# Patient Record
Sex: Female | Born: 1945 | Race: Black or African American | Hispanic: No | Smoking: Current some day smoker
Health system: Southern US, Community
[De-identification: ages and names within clinical notes are randomized; demographics above are authoritative.]

## PROBLEM LIST (undated history)

## (undated) DIAGNOSIS — I10 Essential (primary) hypertension: Secondary | ICD-10-CM

## (undated) DIAGNOSIS — I639 Cerebral infarction, unspecified: Secondary | ICD-10-CM

## (undated) HISTORY — PX: ABDOMINAL HYSTERECTOMY: SHX81

---

## 2020-09-10 ENCOUNTER — Observation Stay (HOSPITAL_COMMUNITY): Payer: Medicare Other

## 2020-09-10 ENCOUNTER — Encounter (HOSPITAL_BASED_OUTPATIENT_CLINIC_OR_DEPARTMENT_OTHER): Payer: Self-pay

## 2020-09-10 ENCOUNTER — Inpatient Hospital Stay (HOSPITAL_BASED_OUTPATIENT_CLINIC_OR_DEPARTMENT_OTHER)
Admission: EM | Admit: 2020-09-10 | Discharge: 2020-09-13 | DRG: 391 | Disposition: A | Discharge status: Home / Self Care | Payer: Medicare Other | Attending: Internal Medicine | Admitting: Internal Medicine

## 2020-09-10 ENCOUNTER — Emergency Department (HOSPITAL_BASED_OUTPATIENT_CLINIC_OR_DEPARTMENT_OTHER): Payer: Medicare Other

## 2020-09-10 ENCOUNTER — Other Ambulatory Visit: Payer: Self-pay

## 2020-09-10 DIAGNOSIS — K2980 Duodenitis without bleeding: Secondary | ICD-10-CM | POA: Diagnosis present

## 2020-09-10 DIAGNOSIS — Z9049 Acquired absence of other specified parts of digestive tract: Secondary | ICD-10-CM

## 2020-09-10 DIAGNOSIS — E860 Dehydration: Secondary | ICD-10-CM | POA: Diagnosis not present

## 2020-09-10 DIAGNOSIS — R197 Diarrhea, unspecified: Secondary | ICD-10-CM | POA: Diagnosis present

## 2020-09-10 DIAGNOSIS — D649 Anemia, unspecified: Secondary | ICD-10-CM | POA: Diagnosis present

## 2020-09-10 DIAGNOSIS — F17210 Nicotine dependence, cigarettes, uncomplicated: Secondary | ICD-10-CM | POA: Diagnosis not present

## 2020-09-10 DIAGNOSIS — R64 Cachexia: Secondary | ICD-10-CM | POA: Diagnosis not present

## 2020-09-10 DIAGNOSIS — M954 Acquired deformity of chest and rib: Secondary | ICD-10-CM | POA: Diagnosis not present

## 2020-09-10 DIAGNOSIS — I10 Essential (primary) hypertension: Secondary | ICD-10-CM | POA: Diagnosis present

## 2020-09-10 DIAGNOSIS — E876 Hypokalemia: Secondary | ICD-10-CM | POA: Diagnosis present

## 2020-09-10 DIAGNOSIS — K2950 Unspecified chronic gastritis without bleeding: Secondary | ICD-10-CM | POA: Diagnosis not present

## 2020-09-10 DIAGNOSIS — K52832 Lymphocytic colitis: Secondary | ICD-10-CM | POA: Diagnosis present

## 2020-09-10 DIAGNOSIS — Z9181 History of falling: Secondary | ICD-10-CM

## 2020-09-10 DIAGNOSIS — R0602 Shortness of breath: Secondary | ICD-10-CM

## 2020-09-10 DIAGNOSIS — R1310 Dysphagia, unspecified: Secondary | ICD-10-CM | POA: Diagnosis not present

## 2020-09-10 DIAGNOSIS — Z90710 Acquired absence of both cervix and uterus: Secondary | ICD-10-CM

## 2020-09-10 DIAGNOSIS — W19XXXD Unspecified fall, subsequent encounter: Secondary | ICD-10-CM | POA: Diagnosis not present

## 2020-09-10 DIAGNOSIS — K635 Polyp of colon: Secondary | ICD-10-CM | POA: Diagnosis present

## 2020-09-10 DIAGNOSIS — Z681 Body mass index (BMI) 19 or less, adult: Secondary | ICD-10-CM | POA: Diagnosis not present

## 2020-09-10 DIAGNOSIS — N179 Acute kidney failure, unspecified: Secondary | ICD-10-CM | POA: Diagnosis present

## 2020-09-10 DIAGNOSIS — E43 Unspecified severe protein-calorie malnutrition: Secondary | ICD-10-CM | POA: Diagnosis not present

## 2020-09-10 DIAGNOSIS — K648 Other hemorrhoids: Secondary | ICD-10-CM | POA: Diagnosis not present

## 2020-09-10 DIAGNOSIS — R7401 Elevation of levels of liver transaminase levels: Secondary | ICD-10-CM | POA: Diagnosis present

## 2020-09-10 DIAGNOSIS — R0781 Pleurodynia: Secondary | ICD-10-CM | POA: Diagnosis not present

## 2020-09-10 DIAGNOSIS — Z20822 Contact with and (suspected) exposure to covid-19: Secondary | ICD-10-CM | POA: Diagnosis present

## 2020-09-10 DIAGNOSIS — R748 Abnormal levels of other serum enzymes: Secondary | ICD-10-CM | POA: Diagnosis present

## 2020-09-10 DIAGNOSIS — K529 Noninfective gastroenteritis and colitis, unspecified: Secondary | ICD-10-CM | POA: Diagnosis not present

## 2020-09-10 DIAGNOSIS — S2241XD Multiple fractures of ribs, right side, subsequent encounter for fracture with routine healing: Secondary | ICD-10-CM

## 2020-09-10 DIAGNOSIS — Z8249 Family history of ischemic heart disease and other diseases of the circulatory system: Secondary | ICD-10-CM

## 2020-09-10 DIAGNOSIS — R54 Age-related physical debility: Secondary | ICD-10-CM | POA: Diagnosis present

## 2020-09-10 DIAGNOSIS — Z8711 Personal history of peptic ulcer disease: Secondary | ICD-10-CM | POA: Diagnosis not present

## 2020-09-10 DIAGNOSIS — R0789 Other chest pain: Secondary | ICD-10-CM | POA: Diagnosis present

## 2020-09-10 DIAGNOSIS — R52 Pain, unspecified: Secondary | ICD-10-CM

## 2020-09-10 HISTORY — DX: Essential (primary) hypertension: I10

## 2020-09-10 LAB — URINALYSIS, ROUTINE W REFLEX MICROSCOPIC
Bilirubin Urine: NEGATIVE
Glucose, UA: NEGATIVE mg/dL
Hgb urine dipstick: NEGATIVE
Ketones, ur: NEGATIVE mg/dL
Nitrite: NEGATIVE
Protein, ur: NEGATIVE mg/dL
Specific Gravity, Urine: <1.005 — ABNORMAL LOW (ref 1.005–1.030)
pH: 6 (ref 5.0–8.0)

## 2020-09-10 LAB — CBC WITH DIFFERENTIAL/PLATELET
Abs Immature Granulocytes: 0.04 10*3/uL (ref 0.00–0.07)
Basophils Absolute: 0.1 10*3/uL (ref 0.0–0.1)
Basophils Relative: 1 %
Eosinophils Absolute: 0.1 10*3/uL (ref 0.0–0.5)
Eosinophils Relative: 2 %
HCT: 34.7 % — ABNORMAL LOW (ref 36.0–46.0)
Hemoglobin: 12.1 g/dL (ref 12.0–15.0)
Immature Granulocytes: 1 %
Lymphocytes Relative: 18 %
Lymphs Abs: 1.1 10*3/uL (ref 0.7–4.0)
MCH: 33.9 pg (ref 26.0–34.0)
MCHC: 34.9 g/dL (ref 30.0–36.0)
MCV: 97.2 fL (ref 80.0–100.0)
Monocytes Absolute: 0.6 10*3/uL (ref 0.1–1.0)
Monocytes Relative: 10 %
Neutro Abs: 4.2 10*3/uL (ref 1.7–7.7)
Neutrophils Relative %: 68 %
Platelets: 192 10*3/uL (ref 150–400)
RBC: 3.57 MIL/uL — ABNORMAL LOW (ref 3.87–5.11)
RDW: 14.2 % (ref 11.5–15.5)
WBC: 6.2 10*3/uL (ref 4.0–10.5)
nRBC: 0 % (ref 0.0–0.2)

## 2020-09-10 LAB — HEPATITIS PANEL, ACUTE
HCV Ab: NONREACTIVE
Hep A IgM: NONREACTIVE
Hep B C IgM: NONREACTIVE
Hepatitis B Surface Ag: NONREACTIVE

## 2020-09-10 LAB — RENAL FUNCTION PANEL
Albumin: 2.5 g/dL — ABNORMAL LOW (ref 3.5–5.0)
Anion gap: 8 (ref 5–15)
BUN: 23 mg/dL (ref 8–23)
CO2: 18 mmol/L — ABNORMAL LOW (ref 22–32)
Calcium: 8.7 mg/dL — ABNORMAL LOW (ref 8.9–10.3)
Chloride: 108 mmol/L (ref 98–111)
Creatinine, Ser: 1.43 mg/dL — ABNORMAL HIGH (ref 0.44–1.00)
GFR, Estimated: 38 mL/min — ABNORMAL LOW (ref >60)
Glucose, Bld: 92 mg/dL (ref 70–99)
Phosphorus: 2.2 mg/dL — ABNORMAL LOW (ref 2.5–4.6)
Potassium: 3.8 mmol/L (ref 3.5–5.1)
Sodium: 134 mmol/L — ABNORMAL LOW (ref 135–145)

## 2020-09-10 LAB — COMPREHENSIVE METABOLIC PANEL
ALT: 53 U/L — ABNORMAL HIGH (ref 0–44)
AST: 51 U/L — ABNORMAL HIGH (ref 15–41)
Albumin: 2.9 g/dL — ABNORMAL LOW (ref 3.5–5.0)
Alkaline Phosphatase: 117 U/L (ref 38–126)
Anion gap: 11 (ref 5–15)
BUN: 26 mg/dL — ABNORMAL HIGH (ref 8–23)
CO2: 20 mmol/L — ABNORMAL LOW (ref 22–32)
Calcium: 9.2 mg/dL (ref 8.9–10.3)
Chloride: 105 mmol/L (ref 98–111)
Creatinine, Ser: 1.58 mg/dL — ABNORMAL HIGH (ref 0.44–1.00)
GFR, Estimated: 34 mL/min — ABNORMAL LOW (ref >60)
Glucose, Bld: 111 mg/dL — ABNORMAL HIGH (ref 70–99)
Potassium: 2.3 mmol/L — CL (ref 3.5–5.1)
Sodium: 136 mmol/L (ref 135–145)
Total Bilirubin: 0.6 mg/dL (ref 0.3–1.2)
Total Protein: 6.2 g/dL — ABNORMAL LOW (ref 6.5–8.1)

## 2020-09-10 LAB — C DIFFICILE QUICK SCREEN W PCR REFLEX
C Diff antigen: NEGATIVE
C Diff interpretation: NOT DETECTED
C Diff toxin: NEGATIVE

## 2020-09-10 LAB — LIPASE, BLOOD: Lipase: 56 U/L — ABNORMAL HIGH (ref 11–51)

## 2020-09-10 LAB — URINALYSIS, MICROSCOPIC (REFLEX)

## 2020-09-10 LAB — RESP PANEL BY RT-PCR (FLU A&B, COVID) ARPGX2
Influenza A by PCR: NEGATIVE
Influenza B by PCR: NEGATIVE
SARS Coronavirus 2 by RT PCR: NEGATIVE

## 2020-09-10 LAB — MAGNESIUM: Magnesium: 1.5 mg/dL — ABNORMAL LOW (ref 1.7–2.4)

## 2020-09-10 MED ORDER — ONDANSETRON HCL 4 MG PO TABS: 4.0000 mg | ORAL_TABLET | Freq: Four times a day (QID) | ORAL | Status: DC | PRN

## 2020-09-10 MED ORDER — METOPROLOL TARTRATE 5 MG/5ML IV SOLN: 5.0000 mg | Freq: Four times a day (QID) | INTRAVENOUS | Status: DC | PRN

## 2020-09-10 MED ORDER — IPRATROPIUM-ALBUTEROL 0.5-2.5 (3) MG/3ML IN SOLN
3.0000 mL | Freq: Two times a day (BID) | RESPIRATORY_TRACT | Status: DC
  Administered 2020-09-10 – 2020-09-11 (×2): 3 mL via RESPIRATORY_TRACT
  Filled 2020-09-10 (×3): qty 3

## 2020-09-10 MED ORDER — PANTOPRAZOLE SODIUM 40 MG IV SOLR
40.0000 mg | Freq: Two times a day (BID) | INTRAVENOUS | Status: DC
  Administered 2020-09-10 – 2020-09-12 (×5): 40 mg via INTRAVENOUS
  Filled 2020-09-10 (×5): qty 40

## 2020-09-10 MED ORDER — POTASSIUM CHLORIDE 2 MEQ/ML IV SOLN: INTRAVENOUS | Status: DC

## 2020-09-10 MED ORDER — POTASSIUM CHLORIDE 20 MEQ PO PACK
40.0000 meq | PACK | Freq: Every day | ORAL | Status: DC
  Administered 2020-09-10: 40 meq via ORAL
  Filled 2020-09-10: qty 2

## 2020-09-10 MED ORDER — ACETAMINOPHEN 650 MG RE SUPP: 650.0000 mg | Freq: Four times a day (QID) | RECTAL | Status: DC | PRN

## 2020-09-10 MED ORDER — ACETAMINOPHEN 325 MG PO TABS: 650.0000 mg | ORAL_TABLET | Freq: Four times a day (QID) | ORAL | Status: DC | PRN

## 2020-09-10 MED ORDER — ENOXAPARIN SODIUM 30 MG/0.3ML IJ SOSY
30.0000 mg | PREFILLED_SYRINGE | INTRAMUSCULAR | Status: DC
  Administered 2020-09-10: 30 mg via SUBCUTANEOUS
  Filled 2020-09-10: qty 0.3

## 2020-09-10 MED ORDER — MORPHINE SULFATE (PF) 2 MG/ML IV SOLN: 1.0000 mg | INTRAVENOUS | Status: DC | PRN

## 2020-09-10 MED ORDER — IOHEXOL 300 MG/ML  SOLN
75.0000 mL | Freq: Once | INTRAMUSCULAR | Status: AC | PRN
  Administered 2020-09-10: 75 mL via INTRAVENOUS

## 2020-09-10 MED ORDER — LACTATED RINGERS IV BOLUS
500.0000 mL | Freq: Once | INTRAVENOUS | Status: AC
  Administered 2020-09-10: 500 mL via INTRAVENOUS

## 2020-09-10 MED ORDER — KCL IN DEXTROSE-NACL 40-5-0.9 MEQ/L-%-% IV SOLN
INTRAVENOUS | Status: DC
  Filled 2020-09-10 (×2): qty 1000

## 2020-09-10 MED ORDER — ALBUTEROL SULFATE (2.5 MG/3ML) 0.083% IN NEBU: 2.5000 mg | INHALATION_SOLUTION | RESPIRATORY_TRACT | Status: DC | PRN

## 2020-09-10 MED ORDER — OXYCODONE HCL 5 MG PO TABS: 5.0000 mg | ORAL_TABLET | ORAL | Status: DC | PRN

## 2020-09-10 MED ORDER — ONDANSETRON HCL 4 MG/2ML IJ SOLN: 4.0000 mg | Freq: Four times a day (QID) | INTRAMUSCULAR | Status: DC | PRN

## 2020-09-10 MED ORDER — POTASSIUM CHLORIDE 10 MEQ/100ML IV SOLN
10.0000 meq | INTRAVENOUS | Status: AC
  Administered 2020-09-10 (×5): 10 meq via INTRAVENOUS
  Filled 2020-09-10 (×5): qty 100

## 2020-09-10 MED ORDER — SODIUM CHLORIDE 0.9 % IV SOLN: INTRAVENOUS | Status: DC | PRN

## 2020-09-10 MED ORDER — NICOTINE 14 MG/24HR TD PT24
14.0000 mg | MEDICATED_PATCH | Freq: Every day | TRANSDERMAL | Status: DC
  Filled 2020-09-10 (×2): qty 1

## 2020-09-10 MED ORDER — MOMETASONE FURO-FORMOTEROL FUM 100-5 MCG/ACT IN AERO
2.0000 | INHALATION_SPRAY | Freq: Two times a day (BID) | RESPIRATORY_TRACT | Status: DC
  Administered 2020-09-11 – 2020-09-12 (×4): 2 via RESPIRATORY_TRACT
  Filled 2020-09-10: qty 8.8

## 2020-09-10 MED ORDER — POTASSIUM CHLORIDE 20 MEQ PO PACK: 40.0000 meq | PACK | Freq: Once | ORAL | Status: DC

## 2020-09-10 MED ORDER — IPRATROPIUM BROMIDE 0.02 % IN SOLN: 0.5000 mg | RESPIRATORY_TRACT | Status: DC | PRN

## 2020-09-10 NOTE — ED Notes (Addendum)
Assisted to bedside commode

## 2020-09-10 NOTE — ED Provider Notes (Signed)
[facility] EMERGENCY DEPARTMENT Provider Note  CSN: [id number] Arrival date & time: 09/10/20 1694    History Chief Complaint  Patient presents with   Abdominal Pain    Wanda Valdez is a 75 y.o. female reports she had a fall in Jan 22, saw her PCP and told she 'had ruptured her spleen' although she isn't sure how that diagnosis was made and denies having had any kind of imaging. She reports in the last few weeks she has had poor appetite, decreased PO intake, inability to swallow solids (can tolerate liquids) with diarrhea, RUQ abdominal pain and weight loss. She saw her PCP who prescribed her a medication to stimulate her appetite, but it didn't work and she decided not to go back to that doctor because she "didn't think he knew what he was talking about". Denies any fever, cough, SOB, leg swelling, night sweats.    Past Medical History:  Diagnosis Date   Hypertension     Past Surgical History:  Procedure Laterality Date   ABDOMINAL HYSTERECTOMY      History reviewed. No pertinent family history.  Social History   Tobacco Use   Smoking status: Some Days    Pack years: 0.00    Types: Cigarettes   Smokeless tobacco: Never  Vaping Use   Vaping Use: Never used  Substance Use Topics   Alcohol use: Yes    Comment: occassional   Drug use: Never     Home Medications Prior to Admission medications   Not on File     Allergies    Patient has no known allergies.   Review of Systems   Review of Systems A comprehensive review of systems was completed and negative except as noted in HPI.    Physical Exam BP 129/78   Pulse 69   Temp 97.9 F (36.6 C) (Oral)   Resp 17   Ht 4\' 10"  (1.473 m)   Wt 39.5 kg   SpO2 100%   BMI 18.18 kg/m   Physical Exam Vitals and nursing note reviewed.  Constitutional:      Appearance: Normal appearance.  HENT:     Head: Normocephalic and atraumatic.     Nose: Nose normal.     Mouth/Throat:     Mouth: Mucous  membranes are moist.  Eyes:     Extraocular Movements: Extraocular movements intact.     Conjunctiva/sclera: Conjunctivae normal.  Cardiovascular:     Rate and Rhythm: Normal rate.  Pulmonary:     Effort: Pulmonary effort is normal.     Breath sounds: Normal breath sounds.  Abdominal:     General: Abdomen is flat.     Palpations: Abdomen is soft.     Tenderness: There is abdominal tenderness in the right upper quadrant. There is no guarding. Negative signs include Murphy's sign and McBurney's sign.  Musculoskeletal:        General: No swelling. Normal range of motion.     Cervical back: Neck supple.  Skin:    General: Skin is warm and dry.  Neurological:     General: No focal deficit present.     Mental Status: She is alert.  Psychiatric:        Mood and Affect: Mood normal.     ED Results / Procedures / Treatments   Labs (all labs ordered are listed, but only abnormal results are displayed) Labs Reviewed  COMPREHENSIVE METABOLIC PANEL - Abnormal; Notable for the following components:      Result  Value   Potassium 2.3 (*)    CO2 20 (*)    Glucose, Bld 111 (*)    BUN 26 (*)    Creatinine, Ser 1.58 (*)    Total Protein 6.2 (*)    Albumin 2.9 (*)    AST 51 (*)    ALT 53 (*)    GFR, Estimated 34 (*)    All other components within normal limits  LIPASE, BLOOD - Abnormal; Notable for the following components:   Lipase 56 (*)    All other components within normal limits  CBC WITH DIFFERENTIAL/PLATELET - Abnormal; Notable for the following components:   RBC 3.57 (*)    HCT 34.7 (*)    All other components within normal limits  RESP PANEL BY RT-PCR (FLU A&B, COVID) [id number]  SARS CORONAVIRUS 2 (TAT 6-24 HRS)  URINALYSIS, ROUTINE W REFLEX MICROSCOPIC    EKG EKG Interpretation  Date/Time:  Tuesday September 10 2020 12:02:35 EDT Ventricular Rate:  69 PR Interval:  106 QRS Duration: 116 QT Interval:  436 QTC Calculation: 468 R Axis:   -42 Text Interpretation: Ectopic  atrial rhythm Ventricular premature complex Short PR interval Nonspecific IVCD with LAD Abnormal T, consider ischemia, lateral leads ST depression V1-V3, suggest recording posterior leads No old tracing to compare Confirmed by Susy Frizzle [id number]) on 09/10/2020 12:10:55 PM   Radiology CT Abdomen Pelvis W Contrast  Result Date: 09/10/2020 CLINICAL DATA:  Decreased appetite with diarrhea, abdominal pain and decreased urine output for 3 weeks. Bowel obstruction suspected. Patient fell 6 months ago with continued back pain. Mildly elevated lipase and transaminase levels with normal total bilirubin and alkaline phosphatase. EXAM: CT ABDOMEN AND PELVIS WITH CONTRAST TECHNIQUE: Multidetector CT imaging of the abdomen and pelvis was performed using the standard protocol following bolus administration of intravenous contrast. CONTRAST:  1mL OMNIPAQUE IOHEXOL 300 MG/ML  SOLN COMPARISON:  None available. Limited correlation made with the reports from a chest CTA 01/28/2014 and abdominal CT 10/11/2012. FINDINGS: Lower chest: Clear lung bases. No significant pleural or pericardial effusion. There is atherosclerosis of the aorta and coronary arteries. Hepatobiliary: The liver is normal in density without suspicious focal abnormality. Moderate intra and extrahepatic biliary dilatation status post cholecystectomy. The common hepatic duct measures up to 1.6 cm in diameter and tapers distally. No evidence of choledocholithiasis. Pancreas: The pancreatic duct is dilated to 6 mm in the pancreatic head. No pancreatic or ampullary mass identified. The pancreas otherwise appears unremarkable, without surrounding inflammation. Spleen: Normal in size without focal abnormality. Adrenals/Urinary Tract: Both adrenal glands appear normal. Both kidneys demonstrate cortical scarring and scattered small cysts. No evidence of urinary tract calculus or hydronephrosis. The bladder appears unremarkable for its degree of distention.  Stomach/Bowel: Enteric contrast was administered and has passed into the rectum. No evidence of bowel obstruction. The stomach appears unremarkable for its degree of distention. No small bowel wall thickening, distention or surrounding inflammation. There is possible wall thickening in the ascending colon. No focal mass lesion identified. There is moderate stool in the rectum. Vascular/Lymphatic: There are no enlarged abdominal or pelvic lymph nodes. Diffuse aortic and branch vessel atherosclerosis without aneurysm or evidence of large vessel occlusion. The portal, superior mesenteric and splenic veins are patent. Reproductive: Hysterectomy. Probable residual ovarian tissue bilaterally. No suspicious adnexal findings. Other: Extensive postsurgical changes in the anterior abdominal wall consistent with previous hernia repair. No evidence of recurrent hernia. No ascites, free air or focal extraluminal fluid collection. Musculoskeletal: Severe convex right thoracolumbar scoliosis  with associated spondylosis. There is irregular sclerosis involving the sacral ala bilaterally, suspicious for bilateral sacral insufficiency fractures. IMPRESSION: 1. No evidence of bowel obstruction. 2. Possible wall thickening in the right colon which could represent mild colitis. No surrounding inflammatory changes identified. 3. Moderate intrahepatic and extrahepatic biliary dilatation with mild pancreatic ductal dilatation. Findings could be physiologic given the laboratory absence of biliary obstruction. Cannot exclude small ampullary or distal common bile duct lesion. 4. Bilateral renal cortical thinning.  No hydronephrosis. 5. Suspected bilateral sacral insufficiency fractures. Lumbar scoliosis and spondylosis without acute lumbar spine osseous findings. 6. Extensive Aortic Atherosclerosis (ICD10-I70.0). Electronically Signed   By: Carey Bullocks M.D.   On: 09/10/2020 13:11    Procedures Procedures  Medications Ordered in the  ED Medications  potassium chloride 10 mEq in 100 mL IVPB (10 mEq Intravenous New Bag/Given 09/10/20 1332)  potassium chloride (KLOR-CON) packet 40 mEq (40 mEq Oral Given 09/10/20 1238)  0.9 %  sodium chloride infusion ( Intravenous New Bag/Given 09/10/20 1344)  lactated ringers bolus 500 mL (0 mLs Intravenous Stopped 09/10/20 1342)  iohexol (OMNIPAQUE) 300 MG/ML solution 75 mL (75 mLs Intravenous Contrast Given 09/10/20 1219)     MDM Rules/Calculators/A&P MDM Patient with dysphagia and poor appetite and abdominal pain. Will check labs and send for CT to rule out acute process or signs of obstruction.   ED Course  I have reviewed the triage vital signs and the nursing notes.  Pertinent labs & imaging results that were available during my care of the patient were reviewed by me and considered in my medical decision making (see chart for details).  Clinical Course as of 09/10/20 1424  Tue Sep 10, 2020  1133 CBC is unremarkable.  [CS]  1208 CMP shows marked hypokalemia, patient cannot tolerate oral K-dur. Will begin IV repletion. Also an elevation in Cr, no recent labs to compare. Protein is also low. Will plan admission to the hospital for further management when CT has been done.  [CS]  1211 EKG with changes which may be due to hypokalemia (IVCD, ST depression).  [CS]  1342 CT without signs of spleen injury, she now states it was maybe her pelvis she injured when she fell. Maybe mild colitis, but no signs of infection on CBC.  [CS]  1418 Spoke with Dr. Frederick Peers, Hospitalist, who will accept for admission.  [CS]    Clinical Course User Index [CS] Pollyann Savoy, MD    Final Clinical Impression(s) / ED Diagnoses Final diagnoses:  Hypokalemia  Dysphagia, unspecified type    Rx / DC Orders ED Discharge Orders     None        Pollyann Savoy, MD 09/10/20 1424

## 2020-09-10 NOTE — ED Notes (Signed)
Assisted to bed side commode. Approx loose watery brown stool noted.

## 2020-09-10 NOTE — Plan of Care (Signed)
  Problem: Education: Goal: Knowledge of General Education information will improve Description: Including pain rating scale, medication(s)/side effects and non-pharmacologic comfort measures Outcome: Progressing   Problem: Health Behavior/Discharge Planning: Goal: Ability to manage health-related needs will improve Outcome: Progressing   Problem: Clinical Measurements: Goal: Will remain free from infection Outcome: Progressing Goal: Diagnostic test results will improve Outcome: Progressing Goal: Respiratory complications will improve Outcome: Progressing   Problem: Activity: Goal: Risk for activity intolerance will decrease Outcome: Progressing   

## 2020-09-10 NOTE — H&P (Signed)
History and Physical    Wanda RuaBarbara Worm ZOX:[id number] DOB: 15-Dec-1945 DOA: 09/10/2020  PCP: Doreen SalvageBulla, Donald, PA-C Patient coming from: [facility]  I have personally briefly reviewed patient's old medical records [facility]  Chief Complaint: Difficulty swallowing, weight loss, diarrhea, lower abdominal pain  HPI: Wanda Valdez is a 75 y.o. female with medical history significant of hypertension, history of abdominal hysterectomy, gastric ulcer (approximately 3 years ago per patient )history of cholecystectomy, ongoing tobacco abuse, who [facility] with a 2-week history of dysphagia to solids with a feeling that solid food gets stuck and she has to self-induced to regurgitated, able to tolerate liquids, diarrhea.  Patient also with complaints of lower abdominal pain.  Patient denies any fevers, no chills, no emesis, no chest pain, no cough, no constipation, no palpitations, no melena, no hematemesis, no hematochezia, no dysuria, no syncope episode.  Patient does endorse some intermittent bouts of nausea, some ongoing shortness of breath on exertion, decreased appetite, 20 pound weight loss since February of this year, generalized weakness.  Patient also with complaints of right rib pain after fall in January.  Patient denies any wheezing.  ED Course: Patient seen in the ED, comprehensive metabolic profile obtained with a potassium of 2.3, bicarb of 20, glucose 111, BUN 26, creatinine 1.58, albumin of 2.9, lipase of 56, AST of 51, ALT of 53, protein of 6.2 otherwise was within normal limits.  CBC unremarkable.  SARS coronavirus 2 PCR negative.  Influenza A and B PCR negative.  Urinalysis done with trace leukocytes, nitrite negative, 0-5 WBC.  CT abdomen and pelvis done with no evidence of bowel obstruction, possible wall thickening in the right colon could represent mild colitis, no surrounding inflammatory changes identified, moderate  intrahepatic and extrahepatic biliary dilatation with mild pancreatic ductal dilatation, findings could be physiological given lab absence of biliary obstruction, cannot exclude small ampullary or distal common bile duct lesion.  Bilateral renal cortical thinning.  No hydronephrosis.  Suspected bilateral sacral insufficiency fractures.  Vitals in the ED within normal limits.  Patient given fluid bolus, IV potassium and oral potassium ordered.  Hospitalist were called to admit the patient.  Review of Systems: As per HPI otherwise all other systems reviewed and are negative.  Past Medical History:  Diagnosis Date   Hypertension     Past Surgical History:  Procedure Laterality Date   ABDOMINAL HYSTERECTOMY      Social History  reports that she has been smoking cigarettes. She has never used smokeless tobacco. She reports current alcohol use. She reports that she does not use drugs.  No Known Allergies  Family History  Problem Relation Age of Onset   Cirrhosis Mother    Heart attack Father    Mother deceased age 77 from cirrhosis.  Father deceased in his 3450s from an acute MI  Prior to Admission medications   Not on File    Physical Exam: Vitals:   09/10/20 1304 09/10/20 1400 09/10/20 1550 09/10/20 1701  BP: 117/72 129/78 118/64 128/83  Pulse: 63 69 70 75  Resp: 17 17 20 18   Temp:   98 F (36.7 C) 97.8 F (36.6 C)  TempSrc:   Oral   SpO2: 100% 100% 100% 100%  Weight:      Height:        Constitutional: NAD.  Frail.  Cachectic. Vitals:   09/10/20 1304 09/10/20 1400 09/10/20 1550 09/10/20 1701  BP: 117/72 129/78  118/64 128/83  Pulse: 63 69 70 75  Resp: 17 17 20 18   Temp:   98 F (36.7 C) 97.8 F (36.6 C)  TempSrc:   Oral   SpO2: 100% 100% 100% 100%  Weight:      Height:       Eyes: PERRL, lids and conjunctivae normal ENMT: Mucous membranes are dry. Posterior pharynx clear of any exudate or lesions.Normal dentition.  Neck: normal, supple, no masses, no  thyromegaly Respiratory: Barrel chest clear to auscultation bilaterally, no wheezing, no crackles. Normal respiratory effort. No accessory muscle use.  Cardiovascular: Regular rate and rhythm, no murmurs / rubs / gallops. No extremity edema. 2+ pedal pulses. No carotid bruits.  Abdomen: no tenderness, no masses palpated. No hepatosplenomegaly. Bowel sounds positive.  Musculoskeletal: no clubbing / cyanosis. No joint deformity upper and lower extremities. Good ROM, no contractures. Normal muscle tone.  Skin: no rashes, lesions, ulcers. No induration Neurologic: CN 2-12 grossly intact. Sensation intact, DTR normal. Strength 5/5 in all 4.  Psychiatric: Normal judgment and insight. Alert and oriented x 3. Normal mood.   Labs on Admission: I have personally reviewed following labs and imaging studies  CBC: Recent Labs  Lab 09/10/20 1114  WBC 6.2  NEUTROABS 4.2  HGB 12.1  HCT 34.7*  MCV 97.2  PLT 192    Basic Metabolic Panel: Recent Labs  Lab 09/10/20 1114  NA 136  K 2.3*  CL 105  CO2 20*  GLUCOSE 111*  BUN 26*  CREATININE 1.58*  CALCIUM 9.2    GFR: Estimated Creatinine Clearance: 18.9 mL/min (A) (by C-G formula based on SCr of 1.58 mg/dL (H)).  Liver Function Tests: Recent Labs  Lab 09/10/20 1114  AST 51*  ALT 53*  ALKPHOS 117  BILITOT 0.6  PROT 6.2*  ALBUMIN 2.9*    Urine analysis:    Component Value Date/Time   COLORURINE YELLOW 09/10/2020 1609   APPEARANCEUR CLEAR 09/10/2020 1609   LABSPEC <1.005 (L) 09/10/2020 1609   PHURINE 6.0 09/10/2020 1609   GLUCOSEU NEGATIVE 09/10/2020 1609   HGBUR NEGATIVE 09/10/2020 1609   BILIRUBINUR NEGATIVE 09/10/2020 1609   KETONESUR NEGATIVE 09/10/2020 1609   PROTEINUR NEGATIVE 09/10/2020 1609   NITRITE NEGATIVE 09/10/2020 1609   LEUKOCYTESUR TRACE (A) 09/10/2020 1609    Radiological Exams on Admission: CT Abdomen Pelvis W Contrast  Result Date: 09/10/2020 CLINICAL DATA:  Decreased appetite with diarrhea, abdominal  pain and decreased urine output for 3 weeks. Bowel obstruction suspected. Patient fell 6 months ago with continued back pain. Mildly elevated lipase and transaminase levels with normal total bilirubin and alkaline phosphatase. EXAM: CT ABDOMEN AND PELVIS WITH CONTRAST TECHNIQUE: Multidetector CT imaging of the abdomen and pelvis was performed using the standard protocol following bolus administration of intravenous contrast. CONTRAST:  68mL OMNIPAQUE IOHEXOL 300 MG/ML  SOLN COMPARISON:  None available. Limited correlation made with the reports from a chest CTA 01/28/2014 and abdominal CT 10/11/2012. FINDINGS: Lower chest: Clear lung bases. No significant pleural or pericardial effusion. There is atherosclerosis of the aorta and coronary arteries. Hepatobiliary: The liver is normal in density without suspicious focal abnormality. Moderate intra and extrahepatic biliary dilatation status post cholecystectomy. The common hepatic duct measures up to 1.6 cm in diameter and tapers distally. No evidence of choledocholithiasis. Pancreas: The pancreatic duct is dilated to 6 mm in the pancreatic head. No pancreatic or ampullary mass identified. The pancreas otherwise appears unremarkable, without surrounding inflammation. Spleen: Normal in size without focal abnormality. Adrenals/Urinary Tract: Both  adrenal glands appear normal. Both kidneys demonstrate cortical scarring and scattered small cysts. No evidence of urinary tract calculus or hydronephrosis. The bladder appears unremarkable for its degree of distention. Stomach/Bowel: Enteric contrast was administered and has passed into the rectum. No evidence of bowel obstruction. The stomach appears unremarkable for its degree of distention. No small bowel wall thickening, distention or surrounding inflammation. There is possible wall thickening in the ascending colon. No focal mass lesion identified. There is moderate stool in the rectum. Vascular/Lymphatic: There are no  enlarged abdominal or pelvic lymph nodes. Diffuse aortic and branch vessel atherosclerosis without aneurysm or evidence of large vessel occlusion. The portal, superior mesenteric and splenic veins are patent. Reproductive: Hysterectomy. Probable residual ovarian tissue bilaterally. No suspicious adnexal findings. Other: Extensive postsurgical changes in the anterior abdominal wall consistent with previous hernia repair. No evidence of recurrent hernia. No ascites, free air or focal extraluminal fluid collection. Musculoskeletal: Severe convex right thoracolumbar scoliosis with associated spondylosis. There is irregular sclerosis involving the sacral ala bilaterally, suspicious for bilateral sacral insufficiency fractures. IMPRESSION: 1. No evidence of bowel obstruction. 2. Possible wall thickening in the right colon which could represent mild colitis. No surrounding inflammatory changes identified. 3. Moderate intrahepatic and extrahepatic biliary dilatation with mild pancreatic ductal dilatation. Findings could be physiologic given the laboratory absence of biliary obstruction. Cannot exclude small ampullary or distal common bile duct lesion. 4. Bilateral renal cortical thinning.  No hydronephrosis. 5. Suspected bilateral sacral insufficiency fractures. Lumbar scoliosis and spondylosis without acute lumbar spine osseous findings. 6. Extensive Aortic Atherosclerosis (ICD10-I70.0). Electronically Signed   By: Carey Bullocks M.D.   On: 09/10/2020 13:11    EKG: Independently reviewed.  Slight ST depression V1 through V3.  Normal sinus rhythm.  Assessment/Plan Principal Problem:   Dysphagia Active Problems:   Hypokalemia   Diarrhea   Dehydration   Transaminitis   AKI (acute kidney injury) (HCC)   Colitis: ??   Rib pain on right side    #1 dysphagia to solids -Patient presented with dysphagia to solids with a feeling like it gets stuck in her throat and has to self-induced to come back out.  Able to  tolerate liquids. -Patient does endorse a 20 pound weight loss since February 2022, history of tobacco abuse ongoing.  Patient frail and cachectic. -Placed on full liquid diet and n.p.o. after midnight. -PPI. -Consult with GI for further evaluation and management.  Spoke with Dr. Levora Angel of [facility] and patient will be assessed in the morning.  2.  Diarrhea/?  Mild colitis per CT abdomen/pelvis -Patient with a 2-week history of ongoing diarrhea states what ever she drinks comes out. -Check a GI pathogen panel, C. difficile PCR. -IV fluids. -Hold off on IV antibiotics at this time. -Supportive care.  3.  Severe hypokalemia -Likely secondary to GI losses. -Patient noted to have a potassium of 2.3. -Check a magnesium level, phosphorus level. -Patient received oral potassium supplementation 40 mEq in the ED prior to transfer -Place potassium in IV fluids.   -Potassium packets 40 mEq p.o. x1. -Repeat labs in the morning.  4.  Acute kidney injury -Unknown baseline -Check a urine sodium, urine creatinine -CT abdomen and pelvis negative for hydronephrosis. -IV fluids -Supportive care.  5.  Transaminitis -Check an acute hepatitis panel. -Monitor with hydration. -CT abdomen and pelvis negative for obstruction, moderate intrahepatic and extrahepatic biliary dilatation with mild pancreatic ductal dilatation.  6.  Hypertension -Blood pressure stable. -Med rec pending. -Follow.  7.  Tobacco abuse -Tobacco cessation -Place on nicotine patch.  8.  Dehydration -IV fluids  9.  Right rib pain -Patient with complaints of right-sided chest wall pain after a fall in January. -Rib films. -Pain management.  10.  Shortness of breath on exertion -Patient with complaints of ongoing shortness of breath on exertion. -Patient with ongoing tobacco use and extensive tobacco history and has been smoking since age 16.  Patient also with a barrel chest. -Check a chest x-ray. -Place on Dulera  twice daily, DuoNebs twice daily.   DVT prophylaxis: Lovenox Code Status:   Full Family Communication:  Updated patient.  No family at bedside. Disposition Plan:   Patient is from:  Home  Anticipated DC to:  Home  Anticipated DC date:  1 to 2 days  Anticipated DC barriers: Clinical improvement  Consults called:  Gastroenterology: Dr. Levora Angel Admission status:  Place in observation/telemetry.  Severity of Illness: The appropriate patient status for this patient is OBSERVATION. Observation status is judged to be reasonable and necessary in order to provide the required intensity of service to ensure the patient's safety. The patient's presenting symptoms, physical exam findings, and initial radiographic and laboratory data in the context of their medical condition is felt to place them at decreased risk for further clinical deterioration. Furthermore, it is anticipated that the patient will be medically stable for discharge from the hospital within 2 midnights of admission.     Ramiro Harvest MD [facility]  How to contact the [facility] Attending or Consulting provider 7A - 7P or covering provider during after hours 7P -7A, for this patient?   Check the care team in [facility] and look for a) attending/consulting [facility] provider listed and b) the [facility] team listed Log into [email or web address] and use [employer]'s universal password to access. If you do not have the password, please contact the hospital operator. Locate the [facility] provider you are looking for under [facility] and page to a number that you can be directly reached. If you still have difficulty reaching the provider, please page the [facility] (Director on Call) for the Hospitalists listed on amion for assistance.  09/10/2020, 6:50 PM

## 2020-09-10 NOTE — ED Triage Notes (Signed)
Pt states had a fall in January, c/o continued back pain. Also states has decreased appetite, diarrhea, decreased urine output and abdominal pain x 3 weeks with dysphagia.

## 2020-09-11 ENCOUNTER — Encounter (HOSPITAL_COMMUNITY): Payer: Self-pay | Admitting: Family Medicine

## 2020-09-11 ENCOUNTER — Encounter (HOSPITAL_COMMUNITY)
Admission: EM | Disposition: A | Discharge status: Home / Self Care | Payer: Self-pay | Source: Home / Self Care | Attending: Internal Medicine

## 2020-09-11 ENCOUNTER — Observation Stay (HOSPITAL_COMMUNITY): Payer: Medicare Other | Admitting: Anesthesiology

## 2020-09-11 ENCOUNTER — Observation Stay (HOSPITAL_COMMUNITY): Payer: Medicare Other

## 2020-09-11 DIAGNOSIS — Z8711 Personal history of peptic ulcer disease: Secondary | ICD-10-CM | POA: Diagnosis not present

## 2020-09-11 DIAGNOSIS — E876 Hypokalemia: Secondary | ICD-10-CM | POA: Diagnosis not present

## 2020-09-11 DIAGNOSIS — E860 Dehydration: Secondary | ICD-10-CM | POA: Diagnosis not present

## 2020-09-11 DIAGNOSIS — R0781 Pleurodynia: Secondary | ICD-10-CM | POA: Diagnosis present

## 2020-09-11 DIAGNOSIS — Z8249 Family history of ischemic heart disease and other diseases of the circulatory system: Secondary | ICD-10-CM | POA: Diagnosis not present

## 2020-09-11 DIAGNOSIS — N179 Acute kidney failure, unspecified: Secondary | ICD-10-CM | POA: Diagnosis not present

## 2020-09-11 DIAGNOSIS — R64 Cachexia: Secondary | ICD-10-CM | POA: Diagnosis not present

## 2020-09-11 DIAGNOSIS — K2950 Unspecified chronic gastritis without bleeding: Secondary | ICD-10-CM | POA: Diagnosis present

## 2020-09-11 DIAGNOSIS — K529 Noninfective gastroenteritis and colitis, unspecified: Secondary | ICD-10-CM | POA: Diagnosis present

## 2020-09-11 DIAGNOSIS — K2980 Duodenitis without bleeding: Secondary | ICD-10-CM | POA: Diagnosis present

## 2020-09-11 DIAGNOSIS — W19XXXD Unspecified fall, subsequent encounter: Secondary | ICD-10-CM | POA: Diagnosis present

## 2020-09-11 DIAGNOSIS — Z681 Body mass index (BMI) 19 or less, adult: Secondary | ICD-10-CM | POA: Diagnosis not present

## 2020-09-11 DIAGNOSIS — F17210 Nicotine dependence, cigarettes, uncomplicated: Secondary | ICD-10-CM | POA: Diagnosis present

## 2020-09-11 DIAGNOSIS — K648 Other hemorrhoids: Secondary | ICD-10-CM | POA: Diagnosis present

## 2020-09-11 DIAGNOSIS — Z90710 Acquired absence of both cervix and uterus: Secondary | ICD-10-CM | POA: Diagnosis not present

## 2020-09-11 DIAGNOSIS — R54 Age-related physical debility: Secondary | ICD-10-CM | POA: Diagnosis not present

## 2020-09-11 DIAGNOSIS — E43 Unspecified severe protein-calorie malnutrition: Secondary | ICD-10-CM | POA: Diagnosis not present

## 2020-09-11 DIAGNOSIS — R1310 Dysphagia, unspecified: Secondary | ICD-10-CM | POA: Diagnosis not present

## 2020-09-11 DIAGNOSIS — Z9049 Acquired absence of other specified parts of digestive tract: Secondary | ICD-10-CM | POA: Diagnosis not present

## 2020-09-11 DIAGNOSIS — Z20822 Contact with and (suspected) exposure to covid-19: Secondary | ICD-10-CM | POA: Diagnosis not present

## 2020-09-11 DIAGNOSIS — Z9181 History of falling: Secondary | ICD-10-CM | POA: Diagnosis not present

## 2020-09-11 DIAGNOSIS — M954 Acquired deformity of chest and rib: Secondary | ICD-10-CM | POA: Diagnosis present

## 2020-09-11 DIAGNOSIS — I10 Essential (primary) hypertension: Secondary | ICD-10-CM | POA: Diagnosis not present

## 2020-09-11 DIAGNOSIS — D649 Anemia, unspecified: Secondary | ICD-10-CM | POA: Diagnosis not present

## 2020-09-11 HISTORY — PX: ESOPHAGOGASTRODUODENOSCOPY: SHX5428

## 2020-09-11 HISTORY — PX: BIOPSY: SHX5522

## 2020-09-11 LAB — COMPREHENSIVE METABOLIC PANEL
ALT: 58 U/L — ABNORMAL HIGH (ref 0–44)
AST: 70 U/L — ABNORMAL HIGH (ref 15–41)
Albumin: 2.4 g/dL — ABNORMAL LOW (ref 3.5–5.0)
Alkaline Phosphatase: 116 U/L (ref 38–126)
Anion gap: 5 (ref 5–15)
BUN: 19 mg/dL (ref 8–23)
CO2: 16 mmol/L — ABNORMAL LOW (ref 22–32)
Calcium: 8.3 mg/dL — ABNORMAL LOW (ref 8.9–10.3)
Chloride: 116 mmol/L — ABNORMAL HIGH (ref 98–111)
Creatinine, Ser: 1.03 mg/dL — ABNORMAL HIGH (ref 0.44–1.00)
GFR, Estimated: 56 mL/min — ABNORMAL LOW (ref >60)
Glucose, Bld: 95 mg/dL (ref 70–99)
Potassium: 3.5 mmol/L (ref 3.5–5.1)
Sodium: 137 mmol/L (ref 135–145)
Total Bilirubin: 0.8 mg/dL (ref 0.3–1.2)
Total Protein: 4.9 g/dL — ABNORMAL LOW (ref 6.5–8.1)

## 2020-09-11 LAB — GASTROINTESTINAL PANEL BY PCR, STOOL (REPLACES STOOL CULTURE)

## 2020-09-11 LAB — PROTIME-INR
INR: 1.1 (ref 0.8–1.2)
Prothrombin Time: 14.4 seconds (ref 11.4–15.2)

## 2020-09-11 LAB — CBC
HCT: 29.8 % — ABNORMAL LOW (ref 36.0–46.0)
Hemoglobin: 9.6 g/dL — ABNORMAL LOW (ref 12.0–15.0)
MCH: 33.6 pg (ref 26.0–34.0)
MCHC: 32.2 g/dL (ref 30.0–36.0)
MCV: 104.2 fL — ABNORMAL HIGH (ref 80.0–100.0)
Platelets: 151 10*3/uL (ref 150–400)
RBC: 2.86 MIL/uL — ABNORMAL LOW (ref 3.87–5.11)
RDW: 15.2 % (ref 11.5–15.5)
WBC: 3.8 10*3/uL — ABNORMAL LOW (ref 4.0–10.5)
nRBC: 0.5 % — ABNORMAL HIGH (ref 0.0–0.2)

## 2020-09-11 LAB — IRON AND TIBC
Iron: 76 ug/dL (ref 28–170)
Saturation Ratios: 60 % — ABNORMAL HIGH (ref 10.4–31.8)
TIBC: 127 ug/dL — ABNORMAL LOW (ref 250–450)
UIBC: 51 ug/dL

## 2020-09-11 LAB — RETICULOCYTES
Immature Retic Fract: 10.8 % (ref 2.3–15.9)
RBC.: 2.91 MIL/uL — ABNORMAL LOW (ref 3.87–5.11)
Retic Count, Absolute: 69.8 10*3/uL (ref 19.0–186.0)
Retic Ct Pct: 2.4 % (ref 0.4–3.1)

## 2020-09-11 LAB — PHOSPHORUS: Phosphorus: 2 mg/dL — ABNORMAL LOW (ref 2.5–4.6)

## 2020-09-11 LAB — TSH: TSH: 1.213 u[IU]/mL (ref 0.350–4.500)

## 2020-09-11 LAB — MAGNESIUM: Magnesium: 1.4 mg/dL — ABNORMAL LOW (ref 1.7–2.4)

## 2020-09-11 LAB — LACTIC ACID, PLASMA: Lactic Acid, Venous: 1 mmol/L (ref 0.5–1.9)

## 2020-09-11 LAB — SODIUM, URINE, RANDOM: Sodium, Ur: 37 mmol/L

## 2020-09-11 LAB — VITAMIN B12: Vitamin B-12: 590 pg/mL (ref 180–914)

## 2020-09-11 LAB — CREATININE, URINE, RANDOM: Creatinine, Urine: 123.73 mg/dL

## 2020-09-11 LAB — GLUCOSE, CAPILLARY: Glucose-Capillary: 106 mg/dL — ABNORMAL HIGH (ref 70–99)

## 2020-09-11 LAB — FERRITIN: Ferritin: 188 ng/mL (ref 11–307)

## 2020-09-11 LAB — T4, FREE: Free T4: 0.98 ng/dL (ref 0.61–1.12)

## 2020-09-11 SURGERY — EGD (ESOPHAGOGASTRODUODENOSCOPY)
Anesthesia: Monitor Anesthesia Care

## 2020-09-11 MED ORDER — MAGNESIUM SULFATE 2 GM/50ML IV SOLN
2.0000 g | Freq: Once | INTRAVENOUS | Status: AC
  Administered 2020-09-11: 2 g via INTRAVENOUS
  Filled 2020-09-11: qty 50

## 2020-09-11 MED ORDER — PROPOFOL 500 MG/50ML IV EMUL
INTRAVENOUS | Status: DC | PRN
  Administered 2020-09-11: 120 ug/kg/min via INTRAVENOUS

## 2020-09-11 MED ORDER — PROPOFOL 10 MG/ML IV BOLUS
INTRAVENOUS | Status: DC | PRN
  Administered 2020-09-11 (×2): 20 mg via INTRAVENOUS

## 2020-09-11 MED ORDER — LACTATED RINGERS IV SOLN: INTRAVENOUS | Status: DC

## 2020-09-11 MED ORDER — LIDOCAINE HCL (CARDIAC) PF 100 MG/5ML IV SOSY
PREFILLED_SYRINGE | INTRAVENOUS | Status: DC | PRN
  Administered 2020-09-11: 60 mg via INTRATRACHEAL

## 2020-09-11 MED ORDER — SODIUM CHLORIDE 0.9 % IV SOLN: INTRAVENOUS | Status: DC

## 2020-09-11 MED ORDER — GADOBUTROL 1 MMOL/ML IV SOLN
4.0000 mL | Freq: Once | INTRAVENOUS | Status: AC | PRN
  Administered 2020-09-11: 4 mL via INTRAVENOUS

## 2020-09-11 MED ORDER — PROPOFOL 500 MG/50ML IV EMUL
INTRAVENOUS | Status: AC
  Filled 2020-09-11: qty 50

## 2020-09-11 NOTE — Progress Notes (Signed)
Pt returned from both procedures; EGD and MR Abdomen.   She verbalizes no complaints of discomfort and verbalizes hunger. Discussed orders of IVF's and full liquid diet. She verbalizes understanding.   Assisted pt with ordering meal from cafeteria.   Reminded to call for assistance.

## 2020-09-11 NOTE — Brief Op Note (Signed)
09/10/2020 - 09/11/2020  3:06 PM  PATIENT:  Wanda Valdez  75 y.o. female  PRE-OPERATIVE DIAGNOSIS:  dysphagia, anemia  POST-OPERATIVE DIAGNOSIS:  Gastritis  PROCEDURE:  Procedure(s): ESOPHAGOGASTRODUODENOSCOPY (EGD) (N/A) BIOPSY  SURGEON:  Surgeon(s) and Role:    * Sharise Lippy, MD - Primary   Findings --------- -EGD showed normal-appearing esophagus without any stricture.  Mild gastritis mild duodenitis .biopsies taken.  Recommendations ------------------------ -Start full liquid diet and slowly advance as tolerated -Continue PPI for now -Await MRI MRCP report -Repeat labs in the morning -GI will follow  Kathi Der MD, FACP 09/11/2020, 3:08 PM  Contact #  [phone]

## 2020-09-11 NOTE — Op Note (Signed)
[facility] Patient Name: Wanda Valdez Procedure Date: 09/11/2020 MRN: [id number] Attending MD: Kathi Der , MD Date of Birth: 1945/09/16 CSN: [id number] Age: 75 Admit Type: Inpatient Procedure:                Upper GI endoscopy Indications:              Dysphagia Providers:                Kathi Der, MD, Estella Husk RN, RN, Susann Givens, Arlee Muslim Tech., Technician, Doran Clay CRNA, CRNA Referring MD:              Medicines:                Sedation Administered by an Anesthesia Professional Complications:            No immediate complications. Estimated Blood Loss:     Estimated blood loss was minimal. Procedure:                Pre-Anesthesia Assessment:                           - Prior to the procedure, a History and Physical                            was performed, and patient medications and                            allergies were reviewed. The patient's tolerance of                            previous anesthesia was also reviewed. The risks                            and benefits of the procedure and the sedation                            options and risks were discussed with the patient.                            All questions were answered, and informed consent                            was obtained. Prior Anticoagulants: The patient has                            taken no previous anticoagulant or antiplatelet                            agents. ASA Grade Assessment: III - A patient with  severe systemic disease. After reviewing the risks                            and benefits, the patient was deemed in                            satisfactory condition to undergo the procedure.                           After obtaining informed consent, the endoscope was                            passed under direct vision. Throughout the                             procedure, the patient's blood pressure, pulse, and                            oxygen saturations were monitored continuously. The                            GIF-H190 ([id number]) was introduced through the                            mouth, and advanced to the second part of duodenum.                            The upper GI endoscopy was accomplished without                            difficulty. The patient tolerated the procedure                            well. Scope In: Scope Out: Findings:      The Z-line was regular and was found 35 cm from the incisors.      No endoscopic abnormality was evident in the esophagus to explain the       patient's complaint of dysphagia.      Scattered mild inflammation characterized by congestion (edema) and       erythema was found in the gastric body, in the gastric antrum and in the       prepyloric region of the stomach. Biopsies were taken with a cold       forceps for histology.      The cardia and gastric fundus were normal on retroflexion.      Scattered mild inflammation characterized by erythema was found in the       duodenal bulb.      The first portion of the duodenum and second portion of the duodenum       were normal. Impression:               - Z-line regular, 35 cm from the incisors.                           - No endoscopic esophageal abnormality to explain  patient's dysphagia.                           - Gastritis. Biopsied.                           - Duodenitis.                           - Normal first portion of the duodenum and second                            portion of the duodenum. Moderate Sedation:      Moderate (conscious) sedation was personally administered by an       anesthesia professional. The following parameters were monitored: oxygen       saturation, heart rate, blood pressure, and response to care. Recommendation:           - Return patient to hospital ward for ongoing care.                            - Resume previous diet.                           - Continue present medications.                           - Await pathology results. Procedure Code(s):        --- Professional ---                           [phone], Esophagogastroduodenoscopy, flexible,                            transoral; with biopsy, single or multiple Diagnosis Code(s):        --- Professional ---                           R13.10, Dysphagia, unspecified                           K29.70, Gastritis, unspecified, without bleeding CPT copyright 2019 American Medical Association. All rights reserved. The codes documented in this report are preliminary and upon coder review may  be revised to meet current compliance requirements. Kathi Der, MD Kathi Der, MD 09/11/2020 3:05:19 PM Number of Addenda: 0

## 2020-09-11 NOTE — Consult Note (Signed)
Referring Provider: [facility] Primary Care Physician:  Doreen Salvage, PA-C Primary Gastroenterologist:  Gentry Fitz  Reason for Consultation:  Dysphagia, anemia, diarrhea, colitis  HPI: Wanda Valdez is a 75 y.o. female with past medical history of HTN, gastric ulcer, hysterectomy, and cholecystectomy presenting for consultation of dysphagia.  Patient states she has been having dysphagia for the past 2 weeks.  She is able to swallow liquids, but states solid foods get stuck in her esophagus.  Denies odynphagia.  She has also lost approximately 20 lbs in the past couple of months month.  Has a history of PUD several years ago, though records not available electronically.  She also reports diarrhea for the past 2-3 months. She states it can worsen with certain foods.  Has up to 5 stools/day. Denies melena or hematochezia.  She also has some lower abdominal cramping.  Denies family history of colon cancer or gastrointestinal malignancy.  States she had a colonoscopy approximately 6 years ago at [facility].  Denies aspirin, NSAID, or blood thinner use.  Past Medical History:  Diagnosis Date   Hypertension     Past Surgical History:  Procedure Laterality Date   ABDOMINAL HYSTERECTOMY      Prior to Admission medications   Medication Sig Start Date End Date Taking? Authorizing Provider  allopurinol (ZYLOPRIM) 100 MG tablet Take 100 mg by mouth daily. 08/10/20   [provider]  omeprazole (PRILOSEC) 40 MG capsule Take 40 mg by mouth daily. 07/26/20   [provider]    Scheduled Meds:  ipratropium-albuterol  3 mL Nebulization BID   mometasone-formoterol  2 puff Inhalation BID   nicotine  14 mg Transdermal Daily   pantoprazole (PROTONIX) IV  40 mg Intravenous Q12H   Continuous Infusions:  lactated ringers     magnesium sulfate bolus IVPB     PRN Meds:.acetaminophen **OR** acetaminophen, albuterol, ipratropium, metoprolol tartrate, morphine injection, ondansetron  **OR** ondansetron (ZOFRAN) IV, oxyCODONE  Allergies as of 09/10/2020   (No Known Allergies)    Family History  Problem Relation Age of Onset   Cirrhosis Mother    Heart attack Father     Social History   Socioeconomic History   Marital status: Divorced    Spouse name: Not on file   Number of children: Not on file   Years of education: Not on file   Highest education level: Not on file  Occupational History   Not on file  Tobacco Use   Smoking status: Some Days    Pack years: 0.00    Types: Cigarettes   Smokeless tobacco: Never  Vaping Use   Vaping Use: Never used  Substance and Sexual Activity   Alcohol use: Yes    Comment: occassional   Drug use: Never   Sexual activity: Not on file  Other Topics Concern   Not on file  Social History Narrative   Not on file   Social Determinants of Health   Financial Resource Strain: Not on file  Food Insecurity: Not on file  Transportation Needs: Not on file  Physical Activity: Not on file  Stress: Not on file  Social Connections: Not on file  Intimate Partner Violence: Not on file    Review of Systems: Review of Systems  Constitutional:  Positive for weight loss. Negative for chills and fever.  HENT:  Negative for hearing loss and tinnitus.   Eyes:  Negative for pain and redness.  Respiratory:  Positive for shortness of breath (on exertion). Negative for cough.  Cardiovascular:  Negative for chest pain and palpitations.  Gastrointestinal:  Positive for abdominal pain and diarrhea. Negative for blood in stool, constipation, heartburn, melena, nausea and vomiting.  Genitourinary:  Negative for flank pain and hematuria.  Musculoskeletal:  Negative for falls and myalgias.  Skin:  Negative for itching and rash.  Neurological:  Negative for seizures and loss of consciousness.  Endo/Heme/Allergies:  Negative for polydipsia. Does not bruise/bleed easily.  Psychiatric/Behavioral:  Negative for substance abuse. The patient is  not nervous/anxious.     Physical Exam: Vital signs: Vitals:   09/11/20 0503 09/11/20 0750  BP: 118/71   Pulse: 79   Resp: 15   Temp: (!) 97.5 F (36.4 C)   SpO2: 100% 100%   Last BM Date: 09/10/20  Physical Exam Vitals reviewed.  Constitutional:      General: She is not in acute distress.    Appearance: She is underweight.  HENT:     Head: Normocephalic and atraumatic.     Nose: Nose normal. No congestion.     Mouth/Throat:     Mouth: Mucous membranes are moist.     Pharynx: Oropharynx is clear.  Eyes:     Extraocular Movements: Extraocular movements intact.     Conjunctiva/sclera: Conjunctivae normal.  Cardiovascular:     Rate and Rhythm: Normal rate and regular rhythm.  Abdominal:     General: Abdomen is flat. Bowel sounds are normal. There is no distension.     Palpations: Abdomen is soft. There is no mass.     Tenderness: There is abdominal tenderness (mild, lower abdomen). There is no guarding or rebound.     Hernia: No hernia is present.  Musculoskeletal:        General: No swelling or tenderness.     Cervical back: Normal range of motion and neck supple.  Skin:    General: Skin is warm and dry.  Neurological:     General: No focal deficit present.     Mental Status: She is alert and oriented to person, place, and time.  Psychiatric:        Mood and Affect: Mood normal.        Behavior: Behavior normal. Behavior is cooperative.    GI:  Lab Results: Recent Labs    09/10/20 1114 09/11/20 0502  WBC 6.2 3.8*  HGB 12.1 9.6*  HCT 34.7* 29.8*  PLT 192 151   BMET Recent Labs    09/10/20 1114 09/10/20 1908 09/11/20 0502  NA 136 134* 137  K 2.3* 3.8 3.5  CL 105 108 116*  CO2 20* 18* 16*  GLUCOSE 111* 92 95  BUN 26* 23 19  CREATININE 1.58* 1.43* 1.03*  CALCIUM 9.2 8.7* 8.3*   LFT Recent Labs    09/11/20 0502  PROT 4.9*  ALBUMIN 2.4*  AST 70*  ALT 58*  ALKPHOS 116  BILITOT 0.8   PT/INR Recent Labs    09/11/20 0502  LABPROT 14.4   INR 1.1     Studies/Results: X-ray chest PA and lateral  Result Date: 09/10/2020 CLINICAL DATA:  75 year old female with shortness of breath. EXAM: CHEST - 2 VIEW COMPARISON:  None FINDINGS: No focal consolidation, pleural effusion or pneumothorax. Background of emphysema. The cardiac silhouette is within limits. Atherosclerotic calcification of the aorta. The aorta is tortuous. Osteopenia with degenerative changes of the spine. No acute osseous pathology. IMPRESSION: No active cardiopulmonary disease. Electronically Signed   By: Elgie CollardArash  Radparvar M.D.   On: 09/10/2020 20:19   DG  Ribs Unilateral Right  Result Date: 09/10/2020 CLINICAL DATA:  Shortness of breath and right rib pain, initial encounter EXAM: RIGHT RIBS - 2 VIEW COMPARISON:  None. FINDINGS: Areas of sclerosis are noted in the anterior aspect of the right fourth, fifth and seventh ribs consistent with prior fractures and healing. No acute fractures are noted. No soft tissue abnormality is seen. The lungs are clear. No pneumothorax is noted. IMPRESSION: Old healing rib fractures on the right. No acute abnormality is noted. Electronically Signed   By: Alcide Clever M.D.   On: 09/10/2020 20:19   CT Abdomen Pelvis W Contrast  Result Date: 09/10/2020 CLINICAL DATA:  Decreased appetite with diarrhea, abdominal pain and decreased urine output for 3 weeks. Bowel obstruction suspected. Patient fell 6 months ago with continued back pain. Mildly elevated lipase and transaminase levels with normal total bilirubin and alkaline phosphatase. EXAM: CT ABDOMEN AND PELVIS WITH CONTRAST TECHNIQUE: Multidetector CT imaging of the abdomen and pelvis was performed using the standard protocol following bolus administration of intravenous contrast. CONTRAST:  71mL OMNIPAQUE IOHEXOL 300 MG/ML  SOLN COMPARISON:  None available. Limited correlation made with the reports from a chest CTA 01/28/2014 and abdominal CT 10/11/2012. FINDINGS: Lower chest: Clear lung bases.  No significant pleural or pericardial effusion. There is atherosclerosis of the aorta and coronary arteries. Hepatobiliary: The liver is normal in density without suspicious focal abnormality. Moderate intra and extrahepatic biliary dilatation status post cholecystectomy. The common hepatic duct measures up to 1.6 cm in diameter and tapers distally. No evidence of choledocholithiasis. Pancreas: The pancreatic duct is dilated to 6 mm in the pancreatic head. No pancreatic or ampullary mass identified. The pancreas otherwise appears unremarkable, without surrounding inflammation. Spleen: Normal in size without focal abnormality. Adrenals/Urinary Tract: Both adrenal glands appear normal. Both kidneys demonstrate cortical scarring and scattered small cysts. No evidence of urinary tract calculus or hydronephrosis. The bladder appears unremarkable for its degree of distention. Stomach/Bowel: Enteric contrast was administered and has passed into the rectum. No evidence of bowel obstruction. The stomach appears unremarkable for its degree of distention. No small bowel wall thickening, distention or surrounding inflammation. There is possible wall thickening in the ascending colon. No focal mass lesion identified. There is moderate stool in the rectum. Vascular/Lymphatic: There are no enlarged abdominal or pelvic lymph nodes. Diffuse aortic and branch vessel atherosclerosis without aneurysm or evidence of large vessel occlusion. The portal, superior mesenteric and splenic veins are patent. Reproductive: Hysterectomy. Probable residual ovarian tissue bilaterally. No suspicious adnexal findings. Other: Extensive postsurgical changes in the anterior abdominal wall consistent with previous hernia repair. No evidence of recurrent hernia. No ascites, free air or focal extraluminal fluid collection. Musculoskeletal: Severe convex right thoracolumbar scoliosis with associated spondylosis. There is irregular sclerosis involving the  sacral ala bilaterally, suspicious for bilateral sacral insufficiency fractures. IMPRESSION: 1. No evidence of bowel obstruction. 2. Possible wall thickening in the right colon which could represent mild colitis. No surrounding inflammatory changes identified. 3. Moderate intrahepatic and extrahepatic biliary dilatation with mild pancreatic ductal dilatation. Findings could be physiologic given the laboratory absence of biliary obstruction. Cannot exclude small ampullary or distal common bile duct lesion. 4. Bilateral renal cortical thinning.  No hydronephrosis. 5. Suspected bilateral sacral insufficiency fractures. Lumbar scoliosis and spondylosis without acute lumbar spine osseous findings. 6. Extensive Aortic Atherosclerosis (ICD10-I70.0). Electronically Signed   By: Carey Bullocks M.D.   On: 09/10/2020 13:11    Impression: Dysphagia to solids x 2 weeks, weight loss  Anemia -Hgb 9.6, decreased from 12.1 yesterday -Normal ferritin, normal serum iron, elevated iron saturation (60%)  Diarrhea, colitis on CT imaging CT 06/11/20: Possible wall thickening in the right colon which could represent mild colitis. No surrounding inflammatory changes identified. -C. difficile negative, GI pathogen panel pending  Moderate intrahepatic and extrahepatic biliary dilatation on CT with mild pancreatic ductal dilatation. Findings could be physiologic given the laboratory absence of biliary obstruction. Cannot exclude small ampullary or distal common bile duct lesion. -T. Bili 0.8/ AST 70/ ALT 58/ ALP 116  Plan: EGD today.  I thoroughly discussed procedure with the patient to include nature, alternatives, benefits, and risks (including but not limited to bleeding, infection, perforation, anesthesia/cardiac and pulmonary complications).  Patient verbalized understanding gave verbal consent to proceed with EGD.  Continue supportive care.  Continue Protonix 40 mg IV twice daily.  Await GI pathogen panel.  Consider  outpatient colonoscopy.  Recommend MRI/MRCP for biliary dilation seen on CT.  [facility] will follow.   LOS: 0 days   Edrick Kins  PA-C 09/11/2020, 8:53 AM  Contact #  [phone]

## 2020-09-11 NOTE — Anesthesia Preprocedure Evaluation (Signed)
Anesthesia Evaluation  Patient identified by MRN, date of birth, ID band Patient awake    Reviewed: Allergy & Precautions, NPO status , Patient's Chart, lab work & pertinent test results  Airway Mallampati: II  TM Distance: >3 FB Neck ROM: Full    Dental no notable dental hx. (+) Edentulous Upper, Poor Dentition,    Pulmonary Current Smoker and Patient abstained from smoking.,    Pulmonary exam normal breath sounds clear to auscultation       Cardiovascular Exercise Tolerance: Good hypertension, Pt. on medications Normal cardiovascular exam Rhythm:Regular Rate:Normal     Neuro/Psych    GI/Hepatic Neg liver ROS,   Endo/Other  negative endocrine ROS  Renal/GU Renal diseaseLab Results      Component                Value               Date                      CREATININE               1.03 (H)            09/11/2020                BUN                      19                  09/11/2020                NA                       137                 09/11/2020                K                        3.5                 09/11/2020                CL                       116 (H)             09/11/2020                CO2                      16 (L)              09/11/2020                Musculoskeletal negative musculoskeletal ROS (+)   Abdominal   Peds  Hematology  (+) anemia , Lab Results      Component                Value               Date                      WBC                      3.8 (L)  09/11/2020                HGB                      9.6 (L)             09/11/2020                HCT                      29.8 (L)            09/11/2020                MCV                      104.2 (H)           09/11/2020                PLT                      151                 09/11/2020              Anesthesia Other Findings   Reproductive/Obstetrics                              Anesthesia Physical Anesthesia Plan  ASA: 3  Anesthesia Plan: MAC   Post-op Pain Management:    Induction: Intravenous  PONV Risk Score and Plan: Treatment may vary due to age or medical condition and Ondansetron  Airway Management Planned: Natural Airway  Additional Equipment: None  Intra-op Plan:   Post-operative Plan:   Informed Consent: I have reviewed the patients History and Physical, chart, labs and discussed the procedure including the risks, benefits and alternatives for the proposed anesthesia with the patient or authorized representative who has indicated his/her understanding and acceptance.     Dental advisory given  Plan Discussed with: CRNA  Anesthesia Plan Comments: (Egd for Dysphagia )        Anesthesia Quick Evaluation

## 2020-09-11 NOTE — Anesthesia Postprocedure Evaluation (Signed)
Anesthesia Post Note  Patient: Wanda Valdez  Procedure(s) Performed: ESOPHAGOGASTRODUODENOSCOPY (EGD) BIOPSY     Patient location during evaluation: Endoscopy Anesthesia Type: MAC Level of consciousness: awake and alert Pain management: pain level controlled Vital Signs Assessment: post-procedure vital signs reviewed and stable Respiratory status: spontaneous breathing, nonlabored ventilation, respiratory function stable and patient connected to nasal cannula oxygen Cardiovascular status: blood pressure returned to baseline and stable Postop Assessment: no apparent nausea or vomiting Anesthetic complications: no   No notable events documented.  Last Vitals:  Vitals:   09/11/20 1510 09/11/20 1520  BP: 126/61 126/68  Pulse:    Resp: (!) 23 18  Temp:    SpO2: 95% 95%    Last Pain:  Vitals:   09/11/20 1520  TempSrc:   PainSc: 0-No pain                 Barnet Glasgow

## 2020-09-11 NOTE — Progress Notes (Addendum)
Mobility Specialist - Progress Note    09/11/20 1222  Mobility  Activity Ambulated in hall  Level of Assistance Contact guard assist, steadying assist  Assistive Device Front wheel walker  Distance Ambulated (ft) 230 ft  Mobility Ambulated with assistance in hallway  Mobility Response Tolerated well  Mobility performed by Mobility specialist  $Mobility charge 1 Mobility    Pt ambulated 230 ft in hallway with RW. Pt stated she "usually gets SOB while walking", but did not experience SOB during or after ambulation. Pt did c/o abdominal pain while turning at half way point. Pt states pain has been present since Sunday (7/10). Pt left in recliner after ambulation with call bell at side.  Arliss Journey Mobility Specialist Acute Rehabilitation Services Office: [phone] 09/11/20, 12:26 PM

## 2020-09-11 NOTE — Transfer of Care (Signed)
Immediate Anesthesia Transfer of Care Note  Patient: Keenan Bachelor  Procedure(s) Performed: ESOPHAGOGASTRODUODENOSCOPY (EGD) BIOPSY  Patient Location: PACU  Anesthesia Type:MAC  Level of Consciousness: sedated  Airway & Oxygen Therapy: Patient Spontanous Breathing and Patient connected to face mask oxygen  Post-op Assessment: Report given to RN and Post -op Vital signs reviewed and stable  Post vital signs: Reviewed and stable  Last Vitals:  Vitals Value Taken Time  BP    Temp    Pulse    Resp 20 09/11/20 1502  SpO2    Vitals shown include unvalidated device data.  Last Pain:  Vitals:   09/11/20 1354  TempSrc: Temporal  PainSc: 0-No pain      Patients Stated Pain Goal: 0 (15/72/62 0355)  Complications: No notable events documented.

## 2020-09-11 NOTE — Progress Notes (Signed)
[facility] Progress Note  Patient: Wanda Valdez    KPT:[id number]  DOA: 09/10/2020     Date of Service: the patient was seen and examined on 09/11/2020  Brief hospital course: Past medical history of HTN, hysterectomy, peptic ulcer disease, cholecystectomy.  Presents with complaints of difficulty swallowing and weight loss. Underwent EGD which is negative for any stricture.  Normal esophagus, mild gastritis and mild duodenitis. Currently plan is monitor for diet improvement and electrolyte stability.  Subjective: Still has difficulty swallowing.  No nausea no vomiting.  Seen after EGD.  No other acute complaints.  Good  Assessment and Plan: 1 dysphagia to solids -Patient presented with dysphagia to solids with a feeling like it gets stuck in her throat and has to self-induced to come back out.  Able to tolerate liquids. -20 pound weight loss since February 2022 Underwent EGD with Dr. Levora Angel of [facility], no significant finding  Monitor and work up for other etiologies.   2.  Diarrhea/?  Mild colitis per CT abdomen/pelvis 2-week history of ongoing diarrhea Negative GI pathogen panel, C. difficile PCR. -IV fluids. -Hold off on IV antibiotics at this time. -Supportive care.  3.  Severe hypokalemia Hypomagnesemia  -Likely secondary to GI losses. Replaced   4.  Acute kidney injury -Unknown baseline, resolved -CT abdomen and pelvis negative for hydronephrosis. -IV fluids -Supportive care.  5.  Transaminitis negative acute hepatitis panel. -Monitor with hydration. -CT abdomen and pelvis negative for obstruction, moderate intrahepatic and extrahepatic biliary dilatation with mild pancreatic ductal dilatation.  6.  Hypertension -Blood pressure stable.  7.  Tobacco abuse -Tobacco cessation -Place on nicotine patch.  8.  Dehydration -IV fluids  9.  Right rib pain -Patient with complaints of right-sided chest wall pain after a fall in January. -Rib films  negative acute fracture. Old healing rib fractures on the right -Pain management.  Scheduled Meds:  ipratropium-albuterol  3 mL Nebulization BID   mometasone-formoterol  2 puff Inhalation BID   nicotine  14 mg Transdermal Daily   pantoprazole (PROTONIX) IV  40 mg Intravenous Q12H   Continuous Infusions:  lactated ringers 75 mL/hr at 09/11/20 1443   PRN Meds: acetaminophen **OR** acetaminophen, albuterol, ipratropium, metoprolol tartrate, morphine injection, ondansetron **OR** ondansetron (ZOFRAN) IV, oxyCODONE  Body mass index is 17.69 kg/m.        DVT Prophylaxis:   Place and maintain sequential compression device Start: 09/11/20 0744    Advance goals of care discussion: Pt is Full code.  Family Communication: no family was present at bedside, at the time of interview.   Data Reviewed: I have personally reviewed and interpreted daily labs, tele strips, imaging. Creat is improving. K is better. LFT is better. Hypomagnesemia.  Hb stable.  Physical Exam:  General: Appear in mild distress, no Rash; Oral Mucosa Clear, moist. no Abnormal Neck Mass Or lumps, Conjunctiva normal  Cardiovascular: S1 and S2 Present, no Murmur, Respiratory: good respiratory effort, Bilateral Air entry present and CTA, no Crackles, no wheezes Abdomen: Bowel Sound present, Soft and no tenderness Extremities: no Pedal edema Neurology: alert and oriented to time, place, and person affect appropriate. no new focal deficit Gait not checked due to patient safety concerns  Vitals:   09/11/20 1354 09/11/20 1503 09/11/20 1510 09/11/20 1520  BP: 133/74 128/73 126/61 126/68  Pulse: 76 70    Resp: 19 (!) 22 (!) 23 18  Temp: (!) 97 F (36.1 C) 98 F (36.7 C)    TempSrc: Temporal Oral  SpO2: 100% 100% 95% 95%  Weight:      Height:        Disposition:  Status is: Inpatient  Remains inpatient appropriate because:IV treatments appropriate due to intensity of illness or inability to take PO  Dispo:  The patient is from: Home              Anticipated d/c is to: Home              Patient currently is not medically stable to d/c.   Difficult to place patient No  Time spent: 35 minutes. I reviewed all nursing notes, pharmacy notes, vitals, pertinent old records. I have discussed plan of care as described above with RN.  Author: Lynden Oxford, MD [facility] 09/11/2020 6:10 PM  To reach On-call, see care teams to locate the attending and reach out via [email or web address]. Between 7PM-7AM, please contact night-coverage If you still have difficulty reaching the attending provider, please page the [facility] (Director on Call) for [facility] on amion for assistance.

## 2020-09-11 NOTE — Progress Notes (Signed)
MD order EGD  Consent signed.  Left floor for procedure. Cental monitoring notified.

## 2020-09-12 ENCOUNTER — Encounter (HOSPITAL_COMMUNITY): Payer: Self-pay | Admitting: Gastroenterology

## 2020-09-12 DIAGNOSIS — E43 Unspecified severe protein-calorie malnutrition: Secondary | ICD-10-CM | POA: Insufficient documentation

## 2020-09-12 DIAGNOSIS — R1310 Dysphagia, unspecified: Secondary | ICD-10-CM | POA: Diagnosis not present

## 2020-09-12 LAB — CBC
HCT: 27.1 % — ABNORMAL LOW (ref 36.0–46.0)
Hemoglobin: 8.9 g/dL — ABNORMAL LOW (ref 12.0–15.0)
MCH: 33.7 pg (ref 26.0–34.0)
MCHC: 32.8 g/dL (ref 30.0–36.0)
MCV: 102.7 fL — ABNORMAL HIGH (ref 80.0–100.0)
Platelets: 152 10*3/uL (ref 150–400)
RBC: 2.64 MIL/uL — ABNORMAL LOW (ref 3.87–5.11)
RDW: 15.5 % (ref 11.5–15.5)
WBC: 4.3 10*3/uL (ref 4.0–10.5)
nRBC: 0 % (ref 0.0–0.2)

## 2020-09-12 LAB — SURGICAL PATHOLOGY

## 2020-09-12 LAB — COMPREHENSIVE METABOLIC PANEL
ALT: 44 U/L (ref 0–44)
AST: 38 U/L (ref 15–41)
Albumin: 2.2 g/dL — ABNORMAL LOW (ref 3.5–5.0)
Alkaline Phosphatase: 101 U/L (ref 38–126)
Anion gap: 5 (ref 5–15)
BUN: 14 mg/dL (ref 8–23)
CO2: 17 mmol/L — ABNORMAL LOW (ref 22–32)
Calcium: 8.6 mg/dL — ABNORMAL LOW (ref 8.9–10.3)
Chloride: 117 mmol/L — ABNORMAL HIGH (ref 98–111)
Creatinine, Ser: 1.01 mg/dL — ABNORMAL HIGH (ref 0.44–1.00)
GFR, Estimated: 58 mL/min — ABNORMAL LOW (ref >60)
Glucose, Bld: 92 mg/dL (ref 70–99)
Potassium: 3 mmol/L — ABNORMAL LOW (ref 3.5–5.1)
Sodium: 139 mmol/L (ref 135–145)
Total Bilirubin: 0.5 mg/dL (ref 0.3–1.2)
Total Protein: 4.6 g/dL — ABNORMAL LOW (ref 6.5–8.1)

## 2020-09-12 LAB — GLUCOSE, CAPILLARY: Glucose-Capillary: 100 mg/dL — ABNORMAL HIGH (ref 70–99)

## 2020-09-12 LAB — MAGNESIUM: Magnesium: 1.8 mg/dL (ref 1.7–2.4)

## 2020-09-12 MED ORDER — PEG 3350-KCL-NA BICARB-NACL 420 G PO SOLR
4000.0000 mL | Freq: Once | ORAL | Status: AC
  Administered 2020-09-12: 4000 mL via ORAL

## 2020-09-12 MED ORDER — POTASSIUM CHLORIDE 10 MEQ/100ML IV SOLN
10.0000 meq | INTRAVENOUS | Status: AC
  Administered 2020-09-12 (×2): 10 meq via INTRAVENOUS
  Filled 2020-09-12 (×2): qty 100

## 2020-09-12 MED ORDER — POTASSIUM CHLORIDE CRYS ER 10 MEQ PO TBCR
20.0000 meq | EXTENDED_RELEASE_TABLET | ORAL | Status: AC
  Administered 2020-09-12 (×2): 20 meq via ORAL
  Filled 2020-09-12 (×2): qty 2

## 2020-09-12 NOTE — H&P (View-Only) (Signed)
[facility] Gastroenterology Progress Note  Jinelle Butchko 75 y.o. 03-02-1946  CC:  Dysphagia, anemia, diarrhea, colitis  Subjective: Patient denies complaints.  Is tolerating soft diet.  Had 3 liquid BMs yesterday.  ROS : Review of Systems  Cardiovascular:  Negative for chest pain and palpitations.  Gastrointestinal:  Positive for diarrhea. Negative for abdominal pain, blood in stool, constipation, heartburn, melena, nausea and vomiting.     Objective: Vital signs in last 24 hours: Vitals:   09/12/20 0830 09/12/20 0833  BP:    Pulse:    Resp:    Temp:    SpO2: 94% 94%    Physical Exam:  General:  Alert, cooperative, no distress  Head:  Normocephalic, without obvious abnormality, atraumatic  Eyes:  Anicteric sclera, EOMs intact  Lungs:   Clear to auscultation bilaterally, respirations unlabored  Heart:  Regular rate and rhythm, S1, S2 normal  Abdomen:   Soft, non-tender, non-distended, bowel sounds active all four quadrants   Extremities: Extremities normal, atraumatic, no  edema    Lab Results: Recent Labs    09/10/20 1908 09/11/20 0502 09/12/20 0440  NA 134* 137 139  K 3.8 3.5 3.0*  CL 108 116* 117*  CO2 18* 16* 17*  GLUCOSE 92 95 92  BUN 23 19 14   CREATININE 1.43* 1.03* 1.01*  CALCIUM 8.7* 8.3* 8.6*  MG 1.5* 1.4* 1.8  PHOS 2.2* 2.0*  --    Recent Labs    09/11/20 0502 09/12/20 0440  AST 70* 38  ALT 58* 44  ALKPHOS 116 101  BILITOT 0.8 0.5  PROT 4.9* 4.6*  ALBUMIN 2.4* 2.2*   Recent Labs    09/10/20 1114 09/11/20 0502 09/12/20 0440  WBC 6.2 3.8* 4.3  NEUTROABS 4.2  --   --   HGB 12.1 9.6* 8.9*  HCT 34.7* 29.8* 27.1*  MCV 97.2 104.2* 102.7*  PLT 192 151 152   Recent Labs    09/11/20 0502  LABPROT 14.4  INR 1.1      Assessment: Dysphagia to solids x 2 weeks, weight loss -EGD 09/11/20: No endoscopic esophageal abnormality to explain patient's dysphagia. Gastritis, biopsied; duodenitis   Anemia -Hgb 8.9 decreased from 9.6 yesterday  without overt bleeding -Normal ferritin, normal serum iron, elevated iron saturation (60%) as of 09/11/20   Diarrhea, colitis on CT imaging CT 06/11/20: Possible wall thickening in the right colon which could represent mild colitis. No surrounding inflammatory changes identified. -C. difficile negative, GI pathogen panel pending   Moderate intrahepatic and extrahepatic biliary dilatation on CT with mild pancreatic ductal dilatation. MRI showed stable intra and extrahepatic biliary dilatation with mild pancreatic ductal dilatation. No evidence of choledocholithiasis or other clear explanation.  Normal LFTs: T. Bili 0.5/ AST 38/ ALT 44/ ALP 101  MRI revealed small cystic lesion in the uncinate process of the pancreas without aggressive characteristics. Recommend follow-up imaging in 2 years per consensus guidelines    Plan: Colonoscopy tomorrow for further evaluation of anemia and abnormal CT of colon.    NuLYTELY prep and clear liquid diet today, NPO at midnight.   [facility] will follow.   08/11/20 PA-C 09/12/2020, 10:07 AM  Contact #  [phone]

## 2020-09-12 NOTE — Progress Notes (Signed)
Telemetry order expiring.  Notified MD Allena Katz.  Okay to let telemetry expire as pt no longer needs to wear.  Central Monitoring notified of discontinued telemetry.

## 2020-09-12 NOTE — Progress Notes (Signed)
Initial Nutrition Assessment  DOCUMENTATION CODES:   Underweight, Severe malnutrition in context of chronic illness  INTERVENTION:   Following colonoscopy: -Daily snacks -Boost Breeze po TID, each supplement provides 250 kcal and 9 grams of protein  -Multivitamin with minerals daily  NUTRITION DIAGNOSIS:   Severe Malnutrition related to chronic illness, diarrhea, dysphagia as evidenced by energy intake < or equal to 75% for > or equal to 1 month, severe fat depletion, severe muscle depletion.  GOAL:   Patient will meet greater than or equal to 90% of their needs  MONITOR:   PO intake, Supplement acceptance, Labs, Weight trends, I & O's  REASON FOR ASSESSMENT:   Malnutrition Screening Tool    ASSESSMENT:   75 y.o. female with medical history significant of hypertension, history of abdominal hysterectomy, gastric ulcer (approximately 3 years ago per patient )history of cholecystectomy, ongoing tobacco abuse, who presents to the med [facility] ED with a 2-week history of dysphagia to solids with a feeling that solid food gets stuck and she has to self-induced to regurgitated, able to tolerate liquids, diarrhea.  Patient in room, sitting on the side of the bed. Pt in good spirits, reports continued diarrhea. No N/V. Pt states that she has had diarrhea for 2 months continuously, states within 5 minutes of eating anything she would have a BM. Pt reports having dysphagia but reports her EGD yesterday didn't show any reason as to why.  Pt currently on clears. Will be NPO tomorrow for colonoscopy. Willing to try supplements and snacks following diet advancement.  Per weight records, pt has lost 15 lbs since 10/18/19 (14% wt loss x 11 months, insignificant for time frame). Pt reports around 23 lbs of weight loss but unable to verify in records.  Medications: KLOR-CON, IV KCl   Labs reviewed: CBGs: 100 Low K   NUTRITION - FOCUSED PHYSICAL EXAM:  Flowsheet Row Most Recent  Value  Orbital Region Moderate depletion  Upper Arm Region Severe depletion  Thoracic and Lumbar Region Unable to assess  Buccal Region Moderate depletion  Temple Region Severe depletion  Clavicle Bone Region Moderate depletion  Clavicle and Acromion Bone Region Moderate depletion  Scapular Bone Region Moderate depletion  Dorsal Hand Severe depletion  Patellar Region Moderate depletion  Anterior Thigh Region Moderate depletion  Posterior Calf Region Severe depletion  Edema (RD Assessment) None  Hair Reviewed  Eyes Reviewed       Diet Order:   Diet Order             Diet NPO time specified  Diet effective midnight           Diet clear liquid Room service appropriate? Yes; Fluid consistency: Thin  Diet effective now                   EDUCATION NEEDS:   Education needs have been addressed  Skin:  Skin Assessment: Reviewed RN Assessment  Last BM:  7/13- type 7  Height:   Ht Readings from Last 1 Encounters:  09/10/20 4\' 10"  (1.473 m)    Weight:   Wt Readings from Last 1 Encounters:  09/12/20 39.9 kg    BMI:  Body mass index is 18.38 kg/m.  Estimated Nutritional Needs:   Kcal:  1350-1550  Protein:  65-75g  Fluid:  1.5L/day  09/14/20, MS, RD, LDN Inpatient Clinical Dietitian Contact information available via Amion

## 2020-09-12 NOTE — Anesthesia Preprocedure Evaluation (Addendum)
Anesthesia Evaluation  Patient identified by MRN, date of birth, ID band Patient awake    Reviewed: Allergy & Precautions, NPO status , Patient's Chart, lab work & pertinent test results  Airway Mallampati: II  TM Distance: >3 FB Neck ROM: Full    Dental no notable dental hx. (+) Edentulous Upper, Poor Dentition,    Pulmonary Current Smoker and Patient abstained from smoking.,    Pulmonary exam normal breath sounds clear to auscultation       Cardiovascular Exercise Tolerance: Good hypertension, Pt. on medications (-) CHF Normal cardiovascular exam Rhythm:Regular Rate:Normal     Neuro/Psych    GI/Hepatic Neg liver ROS,   Endo/Other  negative endocrine ROS  Renal/GU Renal diseaseLab Results      Component                Value               Date                      CREATININE               1.03 (H)            09/11/2020                BUN                      19                  09/11/2020                NA                       137                 09/11/2020                K                        3.5                 09/11/2020                CL                       116 (H)             09/11/2020                CO2                      16 (L)              09/11/2020                Musculoskeletal negative musculoskeletal ROS (+)   Abdominal   Peds  Hematology  (+) anemia , Lab Results      Component                Value               Date                      WBC  3.8 (L)             09/11/2020                HGB                      9.6 (L)             09/11/2020                HCT                      29.8 (L)            09/11/2020                MCV                      104.2 (H)           09/11/2020                PLT                      151                 09/11/2020              Anesthesia Other Findings BMI 18  Reproductive/Obstetrics                             Anesthesia Physical Anesthesia Plan  ASA: 3  Anesthesia Plan: MAC   Post-op Pain Management:    Induction:   PONV Risk Score and Plan: Treatment may vary due to age or medical condition  Airway Management Planned: Natural Airway  Additional Equipment: None  Intra-op Plan:   Post-operative Plan:   Informed Consent: I have reviewed the patients History and Physical, chart, labs and discussed the procedure including the risks, benefits and alternatives for the proposed anesthesia with the patient or authorized representative who has indicated his/her understanding and acceptance.     Dental advisory given  Plan Discussed with: CRNA and Anesthesiologist  Anesthesia Plan Comments:        Anesthesia Quick Evaluation

## 2020-09-12 NOTE — Progress Notes (Signed)
Mobility Specialist - Progress Note    09/12/20 1111  Mobility  Activity Ambulated in hall  Level of Assistance Standby assist, set-up cues, supervision of patient - no hands on  Assistive Device Front wheel walker  Distance Ambulated (ft) 300 ft  Mobility Ambulated with assistance in hallway  Mobility Response Tolerated well  Mobility performed by Mobility specialist  $Mobility charge 1 Mobility    Pt ambulated 300 ft in hallway with RW. Pt did not c/o of SOB or dizziness and tolerated session well. Pt returned to recliner after ambulation with call bell at side.   Arliss Journey Mobility Specialist Acute Rehabilitation Services Office: [phone] 09/12/20, 11:14 AM

## 2020-09-12 NOTE — Progress Notes (Signed)
[facility] Progress Note  Wanda Valdez 75 y.o. 03/22/1945  CC:  Dysphagia, anemia, diarrhea, colitis  Subjective: Patient denies complaints.  Is tolerating soft diet.  Had 3 liquid BMs yesterday.  ROS : Review of Systems  Cardiovascular:  Negative for chest pain and palpitations.  Gastrointestinal:  Positive for diarrhea. Negative for abdominal pain, blood in stool, constipation, heartburn, melena, nausea and vomiting.     Objective: Vital signs in last 24 hours: Vitals:   09/12/20 0830 09/12/20 0833  BP:    Pulse:    Resp:    Temp:    SpO2: 94% 94%    Physical Exam:  General:  Alert, cooperative, no distress  Head:  Normocephalic, without obvious abnormality, atraumatic  Eyes:  Anicteric sclera, EOMs intact  Lungs:   Clear to auscultation bilaterally, respirations unlabored  Heart:  Regular rate and rhythm, S1, S2 normal  Abdomen:   Soft, non-tender, non-distended, bowel sounds active all four quadrants   Extremities: Extremities normal, atraumatic, no  edema    Lab Results: Recent Labs    09/10/20 1908 09/11/20 0502 09/12/20 0440  NA 134* 137 139  K 3.8 3.5 3.0*  CL 108 116* 117*  CO2 18* 16* 17*  GLUCOSE 92 95 92  BUN 23 19 14  CREATININE 1.43* 1.03* 1.01*  CALCIUM 8.7* 8.3* 8.6*  MG 1.5* 1.4* 1.8  PHOS 2.2* 2.0*  --    Recent Labs    09/11/20 0502 09/12/20 0440  AST 70* 38  ALT 58* 44  ALKPHOS 116 101  BILITOT 0.8 0.5  PROT 4.9* 4.6*  ALBUMIN 2.4* 2.2*   Recent Labs    09/10/20 1114 09/11/20 0502 09/12/20 0440  WBC 6.2 3.8* 4.3  NEUTROABS 4.2  --   --   HGB 12.1 9.6* 8.9*  HCT 34.7* 29.8* 27.1*  MCV 97.2 104.2* 102.7*  PLT 192 151 152   Recent Labs    09/11/20 0502  LABPROT 14.4  INR 1.1      Assessment: Dysphagia to solids x 2 weeks, weight loss -EGD 09/11/20: No endoscopic esophageal abnormality to explain patient's dysphagia. Gastritis, biopsied; duodenitis   Anemia -Hgb 8.9 decreased from 9.6 yesterday  without overt bleeding -Normal ferritin, normal serum iron, elevated iron saturation (60%) as of 09/11/20   Diarrhea, colitis on CT imaging CT 06/11/20: Possible wall thickening in the right colon which could represent mild colitis. No surrounding inflammatory changes identified. -C. difficile negative, GI pathogen panel pending   Moderate intrahepatic and extrahepatic biliary dilatation on CT with mild pancreatic ductal dilatation. MRI showed stable intra and extrahepatic biliary dilatation with mild pancreatic ductal dilatation. No evidence of choledocholithiasis or other clear explanation.  Normal LFTs: T. Bili 0.5/ AST 38/ ALT 44/ ALP 101  MRI revealed small cystic lesion in the uncinate process of the pancreas without aggressive characteristics. Recommend follow-up imaging in 2 years per consensus guidelines    Plan: Colonoscopy tomorrow for further evaluation of anemia and abnormal CT of colon.    NuLYTELY prep and clear liquid diet today, NPO at midnight.   [facility] will follow.   Megin Consalvo Baron-Johnson PA-C 09/12/2020, 10:07 AM  Contact #  [phone]  

## 2020-09-12 NOTE — Progress Notes (Signed)
[facility] Progress Note  Patient: Wanda Valdez    KYH:[id number]  DOA: 09/10/2020     Date of Service: the patient was seen and examined on 09/12/2020  Brief hospital course: Past medical history of HTN, hysterectomy, peptic ulcer disease, cholecystectomy.  Presents with complaints of difficulty swallowing and weight loss. Underwent EGD which is negative for any stricture.  Normal esophagus, mild gastritis and mild duodenitis. Currently plan is monitor for diet improvement and electrolyte stability.  Subjective: Feeling better.  No nausea no vomiting.  Hungry.  No diarrhea as well.  Assessment and Plan: 1 dysphagia to solids -Patient presented with dysphagia to solids with a feeling like it gets stuck in her throat and has to self-induced to come back out.  Able to tolerate liquids. -20 pound weight loss since February 2022 Underwent EGD with Dr. Levora Angel of [facility], no significant finding  Now scheduled for colonoscopy tomorrow.  2.  Diarrhea/?  Mild colitis per CT abdomen/pelvis 2-week history of ongoing diarrhea Negative GI pathogen panel, C. difficile PCR. -IV fluids. -Hold off on IV antibiotics at this time. -Supportive care.  3.  Severe hypokalemia Hypomagnesemia  -Likely secondary to GI losses. Replaced monitor.  4.  Acute kidney injury -Unknown baseline, resolved -CT abdomen and pelvis negative for hydronephrosis. -IV fluids -Supportive care.  5.  Transaminitis negative acute hepatitis panel. -Monitor with hydration. -CT abdomen and pelvis negative for obstruction, moderate intrahepatic and extrahepatic biliary dilatation with mild pancreatic ductal dilatation.  6.  Hypertension -Blood pressure stable.  7.  Tobacco abuse -Tobacco cessation -Place on nicotine patch.  8.  Dehydration -IV fluids  9.  Right rib pain -Patient with complaints of right-sided chest wall pain after a fall in January. -Rib films negative acute fracture. Old healing  rib fractures on the right -Pain management.  Scheduled Meds:  mometasone-formoterol  2 puff Inhalation BID   nicotine  14 mg Transdermal Daily   pantoprazole (PROTONIX) IV  40 mg Intravenous Q12H   Continuous Infusions:  lactated ringers 75 mL/hr at 09/11/20 1443   PRN Meds: acetaminophen **OR** acetaminophen, albuterol, ipratropium, metoprolol tartrate, ondansetron **OR** ondansetron (ZOFRAN) IV, oxyCODONE  Body mass index is 18.38 kg/m.  Nutrition Problem: Severe Malnutrition Etiology: chronic illness, diarrhea, dysphagia     DVT Prophylaxis:   Place and maintain sequential compression device Start: 09/11/20 0744    Advance goals of care discussion: Pt is Full code.  Family Communication: no family was present at bedside, at the time of interview.  Discussed with daughter on the phone.  Answer questions satisfactorily.  Data Reviewed: I have personally reviewed and interpreted daily labs, tele strips, imaging. Creatinine 1.01.  Improving.  Potassium level still low.  Replacing.  LFT normalized.  Hemoglobin 8.9 dropped from 12.1 on admission likely dilutional.  Physical Exam:  General: Appear in mild distress, no Rash; Oral Mucosa Clear, moist. no Abnormal Neck Mass Or lumps, Conjunctiva normal  Cardiovascular: S1 and S2 Present, no Murmur, Respiratory: good respiratory effort, Bilateral Air entry present and CTA, no Crackles, no wheezes Abdomen: Bowel Sound present, Soft and no tenderness Extremities: no Pedal edema Neurology: alert and oriented to time, place, and person affect appropriate. no new focal deficit Gait not checked due to patient safety concerns  Vitals:   09/12/20 0500 09/12/20 0830 09/12/20 0833 09/12/20 1425  BP:    116/82  Pulse:    75  Resp:    16  Temp:    97.9 F (36.6 C)  TempSrc:  Oral  SpO2:  94% 94% 100%  Weight: 39.9 kg     Height:        Disposition:  Status is: Inpatient  Remains inpatient appropriate because:IV treatments  appropriate due to intensity of illness or inability to take PO  Dispo: The patient is from: Home              Anticipated d/c is to: Home              Patient currently is not medically stable to d/c.   Difficult to place patient No  Time spent: 35 minutes. I reviewed all nursing notes, pharmacy notes, vitals, pertinent old records. I have discussed plan of care as described above with RN.  Author: Lynden Oxford, MD [facility] 09/12/2020 6:14 PM  To reach On-call, see care teams to locate the attending and reach out via [email or web address]. Between 7PM-7AM, please contact night-coverage If you still have difficulty reaching the attending provider, please page the [facility] (Director on Call) for [facility] on amion for assistance.

## 2020-09-13 ENCOUNTER — Inpatient Hospital Stay (HOSPITAL_COMMUNITY): Payer: Medicare Other | Admitting: Anesthesiology

## 2020-09-13 ENCOUNTER — Encounter (HOSPITAL_COMMUNITY): Payer: Self-pay | Admitting: Internal Medicine

## 2020-09-13 ENCOUNTER — Encounter (HOSPITAL_COMMUNITY)
Admission: EM | Disposition: A | Discharge status: Home / Self Care | Payer: Self-pay | Source: Home / Self Care | Attending: Internal Medicine

## 2020-09-13 DIAGNOSIS — K529 Noninfective gastroenteritis and colitis, unspecified: Secondary | ICD-10-CM | POA: Diagnosis not present

## 2020-09-13 DIAGNOSIS — R1310 Dysphagia, unspecified: Secondary | ICD-10-CM | POA: Diagnosis not present

## 2020-09-13 DIAGNOSIS — N179 Acute kidney failure, unspecified: Secondary | ICD-10-CM | POA: Diagnosis not present

## 2020-09-13 DIAGNOSIS — E860 Dehydration: Secondary | ICD-10-CM | POA: Diagnosis not present

## 2020-09-13 HISTORY — PX: POLYPECTOMY: SHX5525

## 2020-09-13 HISTORY — PX: BIOPSY: SHX5522

## 2020-09-13 HISTORY — PX: COLONOSCOPY: SHX5424

## 2020-09-13 LAB — CBC
HCT: 30.1 % — ABNORMAL LOW (ref 36.0–46.0)
Hemoglobin: 9.8 g/dL — ABNORMAL LOW (ref 12.0–15.0)
MCH: 33.7 pg (ref 26.0–34.0)
MCHC: 32.6 g/dL (ref 30.0–36.0)
MCV: 103.4 fL — ABNORMAL HIGH (ref 80.0–100.0)
Platelets: 158 10*3/uL (ref 150–400)
RBC: 2.91 MIL/uL — ABNORMAL LOW (ref 3.87–5.11)
RDW: 15.6 % — ABNORMAL HIGH (ref 11.5–15.5)
WBC: 3.8 10*3/uL — ABNORMAL LOW (ref 4.0–10.5)
nRBC: 0 % (ref 0.0–0.2)

## 2020-09-13 LAB — GLUCOSE, CAPILLARY
Glucose-Capillary: 79 mg/dL (ref 70–99)
Glucose-Capillary: 81 mg/dL (ref 70–99)

## 2020-09-13 LAB — BASIC METABOLIC PANEL
Anion gap: 5 (ref 5–15)
BUN: 10 mg/dL (ref 8–23)
CO2: 19 mmol/L — ABNORMAL LOW (ref 22–32)
Calcium: 8.9 mg/dL (ref 8.9–10.3)
Chloride: 115 mmol/L — ABNORMAL HIGH (ref 98–111)
Creatinine, Ser: 0.97 mg/dL (ref 0.44–1.00)
GFR, Estimated: >60 mL/min (ref >60)
Glucose, Bld: 91 mg/dL (ref 70–99)
Potassium: 3.7 mmol/L (ref 3.5–5.1)
Sodium: 139 mmol/L (ref 135–145)

## 2020-09-13 LAB — MAGNESIUM: Magnesium: 1.7 mg/dL (ref 1.7–2.4)

## 2020-09-13 SURGERY — COLONOSCOPY
Anesthesia: Monitor Anesthesia Care

## 2020-09-13 MED ORDER — LACTATED RINGERS IV SOLN
INTRAVENOUS | Status: AC | PRN
  Administered 2020-09-13: 1000 mL via INTRAVENOUS

## 2020-09-13 MED ORDER — PANTOPRAZOLE SODIUM 40 MG PO TBEC
40.0000 mg | DELAYED_RELEASE_TABLET | Freq: Every day | ORAL | Status: DC
  Administered 2020-09-13: 40 mg via ORAL
  Filled 2020-09-13 (×2): qty 1

## 2020-09-13 MED ORDER — ADULT MULTIVITAMIN W/MINERALS CH: 1.0000 | ORAL_TABLET | Freq: Every day | ORAL | Status: DC

## 2020-09-13 MED ORDER — BOOST / RESOURCE BREEZE PO LIQD CUSTOM
1.0000 | Freq: Three times a day (TID) | ORAL | Status: DC
  Administered 2020-09-13 (×2): 1 via ORAL

## 2020-09-13 MED ORDER — PANTOPRAZOLE SODIUM 40 MG PO TBEC: 40.0000 mg | DELAYED_RELEASE_TABLET | Freq: Every day | ORAL | 0 refills | Status: DC

## 2020-09-13 MED ORDER — ADULT MULTIVITAMIN W/MINERALS CH
1.0000 | ORAL_TABLET | Freq: Every day | ORAL | Status: DC
  Administered 2020-09-13: 1 via ORAL
  Filled 2020-09-13: qty 1

## 2020-09-13 MED ORDER — PROPOFOL 500 MG/50ML IV EMUL
INTRAVENOUS | Status: DC | PRN
  Administered 2020-09-13: 75 ug/kg/min via INTRAVENOUS

## 2020-09-13 NOTE — Progress Notes (Deleted)
Physician Discharge Summary  Wanda Valdez OZH:[id number] DOB: [birth year] DOA: 09/10/2020  PCP: Doreen Salvage, PA-C  Admit date: 09/10/2020 Discharge date: 09/13/2020  Admitted From: Home Disposition: Home  Recommendations for Outpatient Follow-up:  Follow up with PCP in 1-2 weeks Follow-up on pending pathology results Follow-up with GI as needed.  Home Health: None Equipment/Devices: None Discharge Condition: Stable CODE STATUS: Full code Diet recommendation: GI soft diet  Brief/Interim Summary: Past medical history of HTN, hysterectomy, peptic ulcer disease, cholecystectomy.  Presents with complaints of difficulty swallowing and weight loss of 20 pounds since February 2022.  GI consulted.  Dysphagia to solids -Underwent EGD which is negative for any stricture.  Normal esophagus, mild gastritis and mild duodenitis.  Underwent colonoscopy which showed few scattered areas of erythema in the ascending and transverse colon.  Biopsies taken-pathology pending -GI recommended oral PPI once daily and follow-up outpatient as needed. -Patient tolerated GI soft diet after the procedure -Discussed with [facility] PA-okay for DC from GI standpoint.  Mild colitis: -Negative GI pathogen panel, C. difficile PCR.  IV fluids was given. -Supportive care.  Severe hypokalemia Hypomagnesemia  AKI Elevated liver enzymes -Resolved  Hypertension -Blood pressure remained stable  Right rib pain -Rib films negative acute fracture. Old healing rib fractures on the right -Tylenol as needed for pain control   Discharge Diagnoses:  Dysphagia to solids Mild colitis Hypokalemia Hypomagnesemia AKI Elevated liver enzymes Hypertension Right rib pain Tobacco abuse   Discharge Instructions  Discharge Instructions     Discharge instructions   Complete by: As directed    Follow-up with PCP in 1 to 2 weeks Follow-up on pending pathology results GI soft diet Follow-up with GI as needed    Increase activity slowly   Complete by: As directed       Allergies as of 09/13/2020   No Known Allergies      Medication List     STOP taking these medications    omeprazole 40 MG capsule Commonly known as: PRILOSEC       TAKE these medications    alendronate 35 MG tablet Commonly known as: FOSAMAX Take 35 mg by mouth once a week.   allopurinol 100 MG tablet Commonly known as: ZYLOPRIM Take 100 mg by mouth daily.   carvedilol 6.25 MG tablet Commonly known as: COREG Take 6.25 mg by mouth 2 (two) times daily.   Creon 6000-19000 units Cpep Generic drug: Pancrelipase (Lip-Prot-Amyl) Take 2 capsules by mouth 3 (three) times daily.   doxycycline 50 MG capsule Commonly known as: MONODOX Take 50 mg by mouth 2 (two) times daily.   M-Natal Plus 27-1 MG Tabs Take 1 tablet by mouth 2 (two) times daily.   multivitamin with minerals Tabs tablet Take 1 tablet by mouth daily. Start taking on: September 14, 2020   pantoprazole 40 MG tablet Commonly known as: PROTONIX Take 1 tablet (40 mg total) by mouth daily. Start taking on: September 14, 2020   potassium chloride SA 20 MEQ tablet Commonly known as: KLOR-CON Take 20 mEq by mouth daily.   predniSONE 10 MG tablet Commonly known as: DELTASONE Take 10 mg by mouth daily with breakfast.   sucralfate 1 g tablet Commonly known as: CARAFATE Take 1 g by mouth 3 (three) times daily. Dissolve 1 tablet in 2 ounces of water       ASK your doctor about these medications    furosemide 20 MG tablet Commonly known as: LASIX Take 20 mg by mouth daily.  No Known Allergies  Consultations: GI   Procedures/Studies: X-ray chest PA and lateral  Result Date: 09/10/2020 CLINICAL DATA:  75 year old female with shortness of breath. EXAM: CHEST - 2 VIEW COMPARISON:  None FINDINGS: No focal consolidation, pleural effusion or pneumothorax. Background of emphysema. The cardiac silhouette is within limits. Atherosclerotic  calcification of the aorta. The aorta is tortuous. Osteopenia with degenerative changes of the spine. No acute osseous pathology. IMPRESSION: No active cardiopulmonary disease. Electronically Signed   By: Elgie Collard M.D.   On: 09/10/2020 20:19   DG Ribs Unilateral Right  Result Date: 09/10/2020 CLINICAL DATA:  Shortness of breath and right rib pain, initial encounter EXAM: RIGHT RIBS - 2 VIEW COMPARISON:  None. FINDINGS: Areas of sclerosis are noted in the anterior aspect of the right fourth, fifth and seventh ribs consistent with prior fractures and healing. No acute fractures are noted. No soft tissue abnormality is seen. The lungs are clear. No pneumothorax is noted. IMPRESSION: Old healing rib fractures on the right. No acute abnormality is noted. Electronically Signed   By: Alcide Clever M.D.   On: 09/10/2020 20:19   CT Abdomen Pelvis W Contrast  Result Date: 09/10/2020 CLINICAL DATA:  Decreased appetite with diarrhea, abdominal pain and decreased urine output for 3 weeks. Bowel obstruction suspected. Patient fell 6 months ago with continued back pain. Mildly elevated lipase and transaminase levels with normal total bilirubin and alkaline phosphatase. EXAM: CT ABDOMEN AND PELVIS WITH CONTRAST TECHNIQUE: Multidetector CT imaging of the abdomen and pelvis was performed using the standard protocol following bolus administration of intravenous contrast. CONTRAST:  75mL OMNIPAQUE IOHEXOL 300 MG/ML  SOLN COMPARISON:  None available. Limited correlation made with the reports from a chest CTA 01/28/2014 and abdominal CT 10/11/2012. FINDINGS: Lower chest: Clear lung bases. No significant pleural or pericardial effusion. There is atherosclerosis of the aorta and coronary arteries. Hepatobiliary: The liver is normal in density without suspicious focal abnormality. Moderate intra and extrahepatic biliary dilatation status post cholecystectomy. The common hepatic duct measures up to 1.6 cm in diameter and  tapers distally. No evidence of choledocholithiasis. Pancreas: The pancreatic duct is dilated to 6 mm in the pancreatic head. No pancreatic or ampullary mass identified. The pancreas otherwise appears unremarkable, without surrounding inflammation. Spleen: Normal in size without focal abnormality. Adrenals/Urinary Tract: Both adrenal glands appear normal. Both kidneys demonstrate cortical scarring and scattered small cysts. No evidence of urinary tract calculus or hydronephrosis. The bladder appears unremarkable for its degree of distention. Stomach/Bowel: Enteric contrast was administered and has passed into the rectum. No evidence of bowel obstruction. The stomach appears unremarkable for its degree of distention. No small bowel wall thickening, distention or surrounding inflammation. There is possible wall thickening in the ascending colon. No focal mass lesion identified. There is moderate stool in the rectum. Vascular/Lymphatic: There are no enlarged abdominal or pelvic lymph nodes. Diffuse aortic and branch vessel atherosclerosis without aneurysm or evidence of large vessel occlusion. The portal, superior mesenteric and splenic veins are patent. Reproductive: Hysterectomy. Probable residual ovarian tissue bilaterally. No suspicious adnexal findings. Other: Extensive postsurgical changes in the anterior abdominal wall consistent with previous hernia repair. No evidence of recurrent hernia. No ascites, free air or focal extraluminal fluid collection. Musculoskeletal: Severe convex right thoracolumbar scoliosis with associated spondylosis. There is irregular sclerosis involving the sacral ala bilaterally, suspicious for bilateral sacral insufficiency fractures. IMPRESSION: 1. No evidence of bowel obstruction. 2. Possible wall thickening in the right colon which could represent mild colitis. No  surrounding inflammatory changes identified. 3. Moderate intrahepatic and extrahepatic biliary dilatation with mild  pancreatic ductal dilatation. Findings could be physiologic given the laboratory absence of biliary obstruction. Cannot exclude small ampullary or distal common bile duct lesion. 4. Bilateral renal cortical thinning.  No hydronephrosis. 5. Suspected bilateral sacral insufficiency fractures. Lumbar scoliosis and spondylosis without acute lumbar spine osseous findings. 6. Extensive Aortic Atherosclerosis (ICD10-I70.0). Electronically Signed   By: Carey Bullocks M.D.   On: 09/10/2020 13:11   MR 3D Recon At Scanner  Result Date: 09/11/2020 CLINICAL DATA:  Biliary dilatation on CT. Decreased appetite with diarrhea, abdominal pain and decreased urine output for 3 weeks. EXAM: MRI ABDOMEN WITHOUT AND WITH CONTRAST (INCLUDING MRCP) TECHNIQUE: Multiplanar multisequence MR imaging of the abdomen was performed both before and after the administration of intravenous contrast. Heavily T2-weighted images of the biliary and pancreatic ducts were obtained, and three-dimensional MRCP images were rendered by post processing. CONTRAST:  110mL GADAVIST GADOBUTROL 1 MMOL/ML IV SOLN COMPARISON:  Abdominopelvic CT 09/10/2020 FINDINGS: Lower chest:  The visualized lower chest appears unremarkable. Hepatobiliary: Mild hepatic steatosis with loss of signal on the gradient echo opposed phase images. No focal hepatic lesions or abnormal enhancement identified. Status post cholecystectomy. Again demonstrated is moderate intra and extrahepatic biliary dilatation. The common hepatic duct measures up to 1.4 cm in diameter. No evidence of choledocholithiasis. Pancreas: Mild dilatation of the main pancreatic duct to 6 mm. There is a small cystic lesion in the uncinate process of the pancreas, measuring 1.3 x 0.6 cm on image 14/3. No evidence of enhancing mass or surrounding inflammation. Spleen: Normal in size without focal abnormality. Adrenals/Urinary Tract: Both adrenal glands appear normal. Bilateral renal cortical scarring and cysts. No  hydronephrosis or enhancing mass identified. Stomach/Bowel: No evidence of ampullary mass. Mild wall thickening of the ascending colon again noted, similar to recent CT. The rectum appears mildly distended with liquid stool on the coronal images. No evidence of bowel obstruction. Vascular/Lymphatic: There are no enlarged abdominal lymph nodes. Diffuse aortic and branch vessel atherosclerosis. Other: There are postsurgical changes in the anterior abdominal wall consistent with previous hernia repair. There is associated susceptibility artifact. There is additional artifact within the posterior right chest wall. Musculoskeletal: Convex right thoracolumbar scoliosis with associated spondylosis. Bilateral sacral insufficiency fractures are again noted as seen on CT. These do not appear to be associated with much marrow edema and could be subacute, although are incompletely evaluated by this abdominal study. IMPRESSION: 1. Stable intra and extrahepatic biliary dilatation with mild pancreatic ductal dilatation. No evidence of choledocholithiasis or other clear explanation. Continued follow-up of the patient's liver function studies recommended, currently not suggesting biliary obstruction. 2. Small cystic lesion in the uncinate process of the pancreas without aggressive characteristics. Recommend follow-up imaging in 2 years per consensus guidelines. This recommendation follows ACR consensus guidelines: Management of Incidental Pancreatic Cysts: A White Paper of the ACR Incidental Findings Committee. J Am Coll Radiol 2017;14:911-923. 3. Persistent mild wall thickening of the right colon which could reflect mild colitis. 4. Stable additional incidental findings including renal cortical scarring, aortic atherosclerosis and probable bilateral sacral insufficiency fractures. Electronically Signed   By: Carey Bullocks M.D.   On: 09/11/2020 18:09   MR ABDOMEN MRCP W WO CONTAST  Result Date: 09/11/2020 CLINICAL DATA:   Biliary dilatation on CT. Decreased appetite with diarrhea, abdominal pain and decreased urine output for 3 weeks. EXAM: MRI ABDOMEN WITHOUT AND WITH CONTRAST (INCLUDING MRCP) TECHNIQUE: Multiplanar multisequence MR imaging of the  abdomen was performed both before and after the administration of intravenous contrast. Heavily T2-weighted images of the biliary and pancreatic ducts were obtained, and three-dimensional MRCP images were rendered by post processing. CONTRAST:  21mL GADAVIST GADOBUTROL 1 MMOL/ML IV SOLN COMPARISON:  Abdominopelvic CT 09/10/2020 FINDINGS: Lower chest:  The visualized lower chest appears unremarkable. Hepatobiliary: Mild hepatic steatosis with loss of signal on the gradient echo opposed phase images. No focal hepatic lesions or abnormal enhancement identified. Status post cholecystectomy. Again demonstrated is moderate intra and extrahepatic biliary dilatation. The common hepatic duct measures up to 1.4 cm in diameter. No evidence of choledocholithiasis. Pancreas: Mild dilatation of the main pancreatic duct to 6 mm. There is a small cystic lesion in the uncinate process of the pancreas, measuring 1.3 x 0.6 cm on image 14/3. No evidence of enhancing mass or surrounding inflammation. Spleen: Normal in size without focal abnormality. Adrenals/Urinary Tract: Both adrenal glands appear normal. Bilateral renal cortical scarring and cysts. No hydronephrosis or enhancing mass identified. Stomach/Bowel: No evidence of ampullary mass. Mild wall thickening of the ascending colon again noted, similar to recent CT. The rectum appears mildly distended with liquid stool on the coronal images. No evidence of bowel obstruction. Vascular/Lymphatic: There are no enlarged abdominal lymph nodes. Diffuse aortic and branch vessel atherosclerosis. Other: There are postsurgical changes in the anterior abdominal wall consistent with previous hernia repair. There is associated susceptibility artifact. There is  additional artifact within the posterior right chest wall. Musculoskeletal: Convex right thoracolumbar scoliosis with associated spondylosis. Bilateral sacral insufficiency fractures are again noted as seen on CT. These do not appear to be associated with much marrow edema and could be subacute, although are incompletely evaluated by this abdominal study. IMPRESSION: 1. Stable intra and extrahepatic biliary dilatation with mild pancreatic ductal dilatation. No evidence of choledocholithiasis or other clear explanation. Continued follow-up of the patient's liver function studies recommended, currently not suggesting biliary obstruction. 2. Small cystic lesion in the uncinate process of the pancreas without aggressive characteristics. Recommend follow-up imaging in 2 years per consensus guidelines. This recommendation follows ACR consensus guidelines: Management of Incidental Pancreatic Cysts: A White Paper of the ACR Incidental Findings Committee. J Am Coll Radiol 2017;14:911-923. 3. Persistent mild wall thickening of the right colon which could reflect mild colitis. 4. Stable additional incidental findings including renal cortical scarring, aortic atherosclerosis and probable bilateral sacral insufficiency fractures. Electronically Signed   By: Carey Bullocks M.D.   On: 09/11/2020 18:09      Subjective: Patient seen and examined.  Sitting comfortably on the bed.  No new complaints.  Underwent colonoscopy this morning without any complications.  Tells me that she is doing fine, able to tolerate soft diet without any issues.  Wishes to go home.  Discharge Exam: Vitals:   09/13/20 0810 09/13/20 0829  BP: 134/65 136/76  Pulse: (!) 59 (!) 57  Resp: 18 16  Temp:  (!) 97.5 F (36.4 C)  SpO2: 100% 100%   Vitals:   09/13/20 0754 09/13/20 0800 09/13/20 0810 09/13/20 0829  BP: 127/70 (!) 147/64 134/65 136/76  Pulse: 64 65 (!) 59 (!) 57  Resp: 16 12 18 16   Temp: 97.8 F (36.6 C)   (!) 97.5 F (36.4 C)   TempSrc: Oral   Oral  SpO2: 100% 100% 100% 100%  Weight:      Height:        General: Pt is alert, awake, not in acute distress, on room air, communicating well Cardiovascular: RRR, S1/S2 +,  no rubs, no gallops Respiratory: CTA bilaterally, no wheezing, no rhonchi Abdominal: Soft, NT, ND, bowel sounds + Extremities: no edema, no cyanosis    The results of significant diagnostics from this hospitalization (including imaging, microbiology, ancillary and laboratory) are listed below for reference.     Microbiology: Recent Results (from the past 240 hour(s))  Resp Panel by RT-PCR (Flu A&B, Covid) Nasopharyngeal Swab     Status: None   Collection Time: 09/10/20  1:33 PM   Specimen: Nasopharyngeal Swab; Nasopharyngeal(NP) swabs in vial transport medium  Result Value Ref Range Status   SARS Coronavirus 2 by RT PCR NEGATIVE NEGATIVE Final    Comment: (NOTE) SARS-CoV-2 target nucleic acids are NOT DETECTED.  The SARS-CoV-2 RNA is generally detectable in upper respiratory specimens during the acute phase of infection. The lowest concentration of SARS-CoV-2 viral copies this assay can detect is 138 copies/mL. A negative result does not preclude SARS-Cov-2 infection and should not be used as the sole basis for treatment or other patient management decisions. A negative result may occur with  improper specimen collection/handling, submission of specimen other than nasopharyngeal swab, presence of viral mutation(s) within the areas targeted by this assay, and inadequate number of viral copies(<138 copies/mL). A negative result must be combined with clinical observations, patient history, and epidemiological information. The expected result is Negative.  Fact Sheet for Patients:  BloggerCourse.com  Fact Sheet for Healthcare Providers:  SeriousBroker.it  This test is no t yet approved or cleared by the [place] FDA and  has been  authorized for detection and/or diagnosis of SARS-CoV-2 by FDA under an Emergency Use Authorization (EUA). This EUA will remain  in effect (meaning this test can be used) for the duration of the COVID-19 declaration under Section 564(b)(1) of the Act, 21 U.S.C.section 360bbb-3(b)(1), unless the authorization is terminated  or revoked sooner.       Influenza A by PCR NEGATIVE NEGATIVE Final   Influenza B by PCR NEGATIVE NEGATIVE Final    Comment: (NOTE) The Xpert Xpress SARS-CoV-2/FLU/RSV plus assay is intended as an aid in the diagnosis of influenza from Nasopharyngeal swab specimens and should not be used as a sole basis for treatment. Nasal washings and aspirates are unacceptable for Xpert Xpress SARS-CoV-2/FLU/RSV testing.  Fact Sheet for Patients: BloggerCourse.com  Fact Sheet for Healthcare Providers: SeriousBroker.it  This test is not yet approved or cleared by the [place] FDA and has been authorized for detection and/or diagnosis of SARS-CoV-2 by FDA under an Emergency Use Authorization (EUA). This EUA will remain in effect (meaning this test can be used) for the duration of the COVID-19 declaration under Section 564(b)(1) of the Act, 21 U.S.C. section 360bbb-3(b)(1), unless the authorization is terminated or revoked.  Performed at [facility], [address] [place], [place] [address]   Gastrointestinal Panel by PCR , Stool     Status: None   Collection Time: 09/10/20  5:35 PM   Specimen: Stool  Result Value Ref Range Status   Campylobacter species NOT DETECTED NOT DETECTED Final   Plesimonas shigelloides NOT DETECTED NOT DETECTED Final   Salmonella species NOT DETECTED NOT DETECTED Final   Yersinia enterocolitica NOT DETECTED NOT DETECTED Final   Vibrio species NOT DETECTED NOT DETECTED Final   Vibrio cholerae NOT DETECTED NOT DETECTED Final   Enteroaggregative E coli (EAEC) NOT DETECTED NOT  DETECTED Final   Enteropathogenic E coli (EPEC) NOT DETECTED NOT DETECTED Final   Enterotoxigenic E coli (ETEC) NOT DETECTED  NOT DETECTED Final   Shiga like toxin producing E coli (STEC) NOT DETECTED NOT DETECTED Final   Shigella/Enteroinvasive E coli (EIEC) NOT DETECTED NOT DETECTED Final   Cryptosporidium NOT DETECTED NOT DETECTED Final   Cyclospora cayetanensis NOT DETECTED NOT DETECTED Final   Entamoeba histolytica NOT DETECTED NOT DETECTED Final   Giardia lamblia NOT DETECTED NOT DETECTED Final   Adenovirus F40/41 NOT DETECTED NOT DETECTED Final   Astrovirus NOT DETECTED NOT DETECTED Final   Norovirus GI/GII NOT DETECTED NOT DETECTED Final   Rotavirus A NOT DETECTED NOT DETECTED Final   Sapovirus (I, II, IV, and V) NOT DETECTED NOT DETECTED Final    Comment: Performed at [facility], [address] [place], [place] [address]  C Difficile Quick Screen w PCR reflex     Status: None   Collection Time: 09/10/20  5:35 PM   Specimen: Stool  Result Value Ref Range Status   C Diff antigen NEGATIVE NEGATIVE Final   C Diff toxin NEGATIVE NEGATIVE Final   C Diff interpretation No C. difficile detected.  Final    Comment: Performed at [facility], [address] [place], [place] [address]     Labs: BNP (last 3 results) No results for input(s): BNP in the last 8760 hours. Basic Metabolic Panel: Recent Labs  Lab 09/10/20 1114 09/10/20 1908 09/11/20 0502 09/12/20 0440 09/13/20 0428  NA 136 134* 137 139 139  K 2.3* 3.8 3.5 3.0* 3.7  CL 105 108 116* 117* 115*  CO2 20* 18* 16* 17* 19*  GLUCOSE 111* 92 95 92 91  BUN 26* 23 19 14 10   CREATININE 1.58* 1.43* 1.03* 1.01* 0.97  CALCIUM 9.2 8.7* 8.3* 8.6* 8.9  MG  --  1.5* 1.4* 1.8 1.7  PHOS  --  2.2* 2.0*  --   --    Liver Function Tests: Recent Labs  Lab 09/10/20 1114 09/10/20 1908 09/11/20 0502 09/12/20 0440  AST 51*  --  70* 38  ALT 53*  --  58* 44  ALKPHOS 117  --  116 101  BILITOT 0.6  --   0.8 0.5  PROT 6.2*  --  4.9* 4.6*  ALBUMIN 2.9* 2.5* 2.4* 2.2*   Recent Labs  Lab 09/10/20 1114  LIPASE 56*   No results for input(s): AMMONIA in the last 168 hours. CBC: Recent Labs  Lab 09/10/20 1114 09/11/20 0502 09/12/20 0440 09/13/20 0428  WBC 6.2 3.8* 4.3 3.8*  NEUTROABS 4.2  --   --   --   HGB 12.1 9.6* 8.9* 9.8*  HCT 34.7* 29.8* 27.1* 30.1*  MCV 97.2 104.2* 102.7* 103.4*  PLT 192 151 152 158   Cardiac Enzymes: No results for input(s): CKTOTAL, CKMB, CKMBINDEX, TROPONINI in the last 168 hours. BNP: Invalid input(s): POCBNP CBG: Recent Labs  Lab 09/11/20 1146 09/12/20 0826 09/13/20 0827 09/13/20 1131  GLUCAP 106* 100* 79 81   D-Dimer No results for input(s): DDIMER in the last 72 hours. Hgb A1c No results for input(s): HGBA1C in the last 72 hours. Lipid Profile No results for input(s): CHOL, HDL, LDLCALC, TRIG, CHOLHDL, LDLDIRECT in the last 72 hours. Thyroid function studies Recent Labs    09/11/20 0758  TSH 1.213   Anemia work up Recent Labs    09/11/20 0758  VITAMINB12 590  FERRITIN 188  TIBC 127*  IRON 76  RETICCTPCT 2.4   Urinalysis    Component Value Date/Time   COLORURINE YELLOW 09/10/2020 1609   APPEARANCEUR CLEAR  09/10/2020 1609   LABSPEC <1.005 (L) 09/10/2020 1609   PHURINE 6.0 09/10/2020 1609   GLUCOSEU NEGATIVE 09/10/2020 1609   HGBUR NEGATIVE 09/10/2020 1609   BILIRUBINUR NEGATIVE 09/10/2020 1609   KETONESUR NEGATIVE 09/10/2020 1609   PROTEINUR NEGATIVE 09/10/2020 1609   NITRITE NEGATIVE 09/10/2020 1609   LEUKOCYTESUR TRACE (A) 09/10/2020 1609   Sepsis Labs Invalid input(s): PROCALCITONIN,  WBC,  LACTICIDVEN Microbiology Recent Results (from the past 240 hour(s))  Resp Panel by RT-PCR (Flu A&B, Covid) Nasopharyngeal Swab     Status: None   Collection Time: 09/10/20  1:33 PM   Specimen: Nasopharyngeal Swab; Nasopharyngeal(NP) swabs in vial transport medium  Result Value Ref Range Status   SARS Coronavirus 2 by RT PCR  NEGATIVE NEGATIVE Final    Comment: (NOTE) SARS-CoV-2 target nucleic acids are NOT DETECTED.  The SARS-CoV-2 RNA is generally detectable in upper respiratory specimens during the acute phase of infection. The lowest concentration of SARS-CoV-2 viral copies this assay can detect is 138 copies/mL. A negative result does not preclude SARS-Cov-2 infection and should not be used as the sole basis for treatment or other patient management decisions. A negative result may occur with  improper specimen collection/handling, submission of specimen other than nasopharyngeal swab, presence of viral mutation(s) within the areas targeted by this assay, and inadequate number of viral copies(<138 copies/mL). A negative result must be combined with clinical observations, patient history, and epidemiological information. The expected result is Negative.  Fact Sheet for Patients:  BloggerCourse.com  Fact Sheet for Healthcare Providers:  SeriousBroker.it  This test is no t yet approved or cleared by the [place] FDA and  has been authorized for detection and/or diagnosis of SARS-CoV-2 by FDA under an Emergency Use Authorization (EUA). This EUA will remain  in effect (meaning this test can be used) for the duration of the COVID-19 declaration under Section 564(b)(1) of the Act, 21 U.S.C.section 360bbb-3(b)(1), unless the authorization is terminated  or revoked sooner.       Influenza A by PCR NEGATIVE NEGATIVE Final   Influenza B by PCR NEGATIVE NEGATIVE Final    Comment: (NOTE) The Xpert Xpress SARS-CoV-2/FLU/RSV plus assay is intended as an aid in the diagnosis of influenza from Nasopharyngeal swab specimens and should not be used as a sole basis for treatment. Nasal washings and aspirates are unacceptable for Xpert Xpress SARS-CoV-2/FLU/RSV testing.  Fact Sheet for Patients: BloggerCourse.com  Fact Sheet for  Healthcare Providers: SeriousBroker.it  This test is not yet approved or cleared by the [place] FDA and has been authorized for detection and/or diagnosis of SARS-CoV-2 by FDA under an Emergency Use Authorization (EUA). This EUA will remain in effect (meaning this test can be used) for the duration of the COVID-19 declaration under Section 564(b)(1) of the Act, 21 U.S.C. section 360bbb-3(b)(1), unless the authorization is terminated or revoked.  Performed at [facility], [address] [place], [place] [address]   Gastrointestinal Panel by PCR , Stool     Status: None   Collection Time: 09/10/20  5:35 PM   Specimen: Stool  Result Value Ref Range Status   Campylobacter species NOT DETECTED NOT DETECTED Final   Plesimonas shigelloides NOT DETECTED NOT DETECTED Final   Salmonella species NOT DETECTED NOT DETECTED Final   Yersinia enterocolitica NOT DETECTED NOT DETECTED Final   Vibrio species NOT DETECTED NOT DETECTED Final   Vibrio cholerae NOT DETECTED NOT DETECTED Final   Enteroaggregative E coli (EAEC) NOT DETECTED NOT DETECTED  Final   Enteropathogenic E coli (EPEC) NOT DETECTED NOT DETECTED Final   Enterotoxigenic E coli (ETEC) NOT DETECTED NOT DETECTED Final   Shiga like toxin producing E coli (STEC) NOT DETECTED NOT DETECTED Final   Shigella/Enteroinvasive E coli (EIEC) NOT DETECTED NOT DETECTED Final   Cryptosporidium NOT DETECTED NOT DETECTED Final   Cyclospora cayetanensis NOT DETECTED NOT DETECTED Final   Entamoeba histolytica NOT DETECTED NOT DETECTED Final   Giardia lamblia NOT DETECTED NOT DETECTED Final   Adenovirus F40/41 NOT DETECTED NOT DETECTED Final   Astrovirus NOT DETECTED NOT DETECTED Final   Norovirus GI/GII NOT DETECTED NOT DETECTED Final   Rotavirus A NOT DETECTED NOT DETECTED Final   Sapovirus (I, II, IV, and V) NOT DETECTED NOT DETECTED Final    Comment: Performed at [facility], [address], [place], [place] [address]  C Difficile Quick Screen w PCR reflex     Status: None   Collection Time: 09/10/20  5:35 PM   Specimen: Stool  Result Value Ref Range Status   C Diff antigen NEGATIVE NEGATIVE Final   C Diff toxin NEGATIVE NEGATIVE Final   C Diff interpretation No C. difficile detected.  Final    Comment: Performed at [facility], [address] [place], [place] [address]     Time coordinating discharge: Over 30 minutes  SIGNED:   Ollen Bowl, MD  [facility] 09/13/2020, 12:02 PM Pager   If 7PM-7AM, please contact night-coverage [email or web address]

## 2020-09-13 NOTE — Brief Op Note (Signed)
09/10/2020 - 09/13/2020  7:58 AM  PATIENT:  Wanda Valdez  75 y.o. female  PRE-OPERATIVE DIAGNOSIS:  diarrhea, abnormal CT of colon  POST-OPERATIVE DIAGNOSIS:  Mild inflammation and colon polyps  PROCEDURE:  Procedure(s): COLONOSCOPY (N/A) BIOPSY POLYPECTOMY  SURGEON:  Surgeon(s) and Role:    * Liane Tribbey, MD - Primary  Findings ---------- -Colonoscopy showed few scattered areas of erythema in the ascending and transverse colon, 3 diminutive polyps, hemorrhoids and normal terminal ileum.  Biopsies taken.  Recommendations -------------------------- -Start soft diet and advance as tolerated -Avoid NSAIDs -Change Protonix to p.o. -Follow pathology -No further inpatient GI work-up planned.  GI will sign off.  Call us back if needed  Kathi Der MD, FACP 09/13/2020, 7:59 AM  Contact #  [phone]

## 2020-09-13 NOTE — Interval H&P Note (Signed)
History and Physical Interval Note:  09/13/2020 7:24 AM  Wanda Valdez  has presented today for surgery, with the diagnosis of diarrhea, abnormal CT of colon.  The various methods of treatment have been discussed with the patient and family. After consideration of risks, benefits and other options for treatment, the patient has consented to  Procedure(s): COLONOSCOPY (N/A) as a surgical intervention.  The patient's history has been reviewed, patient examined, no change in status, stable for surgery.  I have reviewed the patient's chart and labs.  Questions were answered to the patient's satisfaction.     Nimah Uphoff

## 2020-09-13 NOTE — Op Note (Signed)
[address] [facility] Patient Name: Wanda Valdez Procedure Date: 09/13/2020 MRN: [id number] Attending MD: Kathi Der , MD Date of Birth: [birth year] CSN: [id number] Age: 75 Admit Type: Inpatient Procedure:                Colonoscopy Indications:              Last colonoscopy: date unknown (unable to locate                            last colonoscopy report), Abnormal CT of the GI                            tract, Incidental diarrhea noted Providers:                Kathi Der, MD, Estella Husk RN, RN, Susann Givens, Lawson Radar, Technician, Sherron Flemings,                            CRNA Referring MD:              Medicines:                Sedation Administered by an Anesthesia Professional Complications:            No immediate complications. Estimated Blood Loss:     Estimated blood loss was minimal. Procedure:                Pre-Anesthesia Assessment:                           - Prior to the procedure, a History and Physical                            was performed, and patient medications and                            allergies were reviewed. The patient's tolerance of                            previous anesthesia was also reviewed. The risks                            and benefits of the procedure and the sedation                            options and risks were discussed with the patient.                            All questions were answered, and informed consent                            was obtained. Prior Anticoagulants: The patient has  taken no previous anticoagulant or antiplatelet                            agents. ASA Grade Assessment: III - A patient with                            severe systemic disease. After reviewing the risks                            and benefits, the patient was deemed in                            satisfactory condition to undergo the procedure.                            After obtaining informed consent, the colonoscope                            was passed under direct vision. Throughout the                            procedure, the patient's blood pressure, pulse, and                            oxygen saturations were monitored continuously. The                            [id number]-[id number] ([id number]) Olympus pediatric colonscope                            was introduced through the anus and advanced to the                            the terminal ileum, with identification of the                            appendiceal orifice and IC valve. The colonoscopy                            was performed without difficulty. The patient                            tolerated the procedure well. The quality of the                            bowel preparation was adequate to identify polyps 6                            mm and larger in size. Scope In: 7:34:05 AM Scope Out: 7:48:26 AM Scope Withdrawal Time: 0 hours 11 minutes 35 seconds  Total Procedure Duration: 0 hours 14 minutes 21 seconds  Findings:      The perianal and digital rectal examinations were normal.      The terminal ileum appeared normal.  A 2 mm polyp was found in the proximal ascending colon. The polyp was       removed with a cold biopsy forceps. Resection and retrieval were       complete.      A scattered area of mildly congested and erythematous mucosa was found       in the transverse colon and in the ascending colon. Biopsies were taken       with a cold forceps for histology.      A 3 mm polyp was found in the transverse colon. The polyp was sessile.       The polyp was removed with a cold biopsy forceps. Resection and       retrieval were complete.      A 2 mm polyp was found in the sigmoid colon. The polyp was removed with       a cold biopsy forceps. Resection and retrieval were complete.      Internal hemorrhoids were found during retroflexion. The hemorrhoids       were  medium-sized. Impression:               - The examined portion of the ileum was normal.                           - One 2 mm polyp in the proximal ascending colon,                            removed with a cold biopsy forceps. Resected and                            retrieved.                           - Congested and erythematous mucosa in the                            transverse colon and in the ascending colon.                            Biopsied.                           - One 3 mm polyp in the transverse colon, removed                            with a cold biopsy forceps. Resected and retrieved.                           - One 2 mm polyp in the sigmoid colon, removed with                            a cold biopsy forceps. Resected and retrieved.                           - Internal hemorrhoids. Moderate Sedation:      Moderate (conscious) sedation was personally administered by an       anesthesia professional. The following parameters  were monitored: oxygen       saturation, heart rate, blood pressure, and response to care. Recommendation:           - Return patient to hospital ward for ongoing care.                           - Soft diet.                           - Continue present medications.                           - Await pathology results.                           - Repeat colonoscopy date to be determined after                            pending pathology results are reviewed for                            surveillance based on pathology results.                           - Return to GI office PRN. Procedure Code(s):        --- Professional ---                           [phone], Colonoscopy, flexible; with biopsy, single                            or multiple Diagnosis Code(s):        --- Professional ---                           K64.8, Other hemorrhoids                           K63.5, Polyp of colon                           K63.89, Other specified diseases of  intestine                           R93.3, Abnormal findings on diagnostic imaging of                            other parts of digestive tract CPT copyright 2019 [employer]. All rights reserved. The codes documented in this report are preliminary and upon coder review may  be revised to meet current compliance requirements. Kathi Der, MD Kathi Der, MD 09/13/2020 7:57:22 AM Number of Addenda: 0

## 2020-09-13 NOTE — Anesthesia Postprocedure Evaluation (Signed)
Anesthesia Post Note  Patient: Wanda Valdez  Procedure(s) Performed: COLONOSCOPY BIOPSY POLYPECTOMY     Patient location during evaluation: Endoscopy Anesthesia Type: MAC Level of consciousness: awake and alert Pain management: pain level controlled Vital Signs Assessment: post-procedure vital signs reviewed and stable Respiratory status: spontaneous breathing, nonlabored ventilation, respiratory function stable and patient connected to nasal cannula oxygen Cardiovascular status: blood pressure returned to baseline and stable Postop Assessment: no apparent nausea or vomiting Anesthetic complications: no   No notable events documented.  Last Vitals:  Vitals:   09/13/20 0800 09/13/20 0810  BP: (!) 147/64 134/65  Pulse: 65 (!) 59  Resp: 12 18  Temp:    SpO2: 100% 100%    Last Pain:  Vitals:   09/13/20 0810  TempSrc:   PainSc: 0-No pain                 Barnet Glasgow

## 2020-09-13 NOTE — Progress Notes (Signed)
Mobility Specialist - Progress Note    09/13/20 1000  Mobility  Activity Ambulated in hall  Level of Assistance Standby assist, set-up cues, supervision of patient - no hands on  Assistive Device Front wheel walker  Distance Ambulated (ft) 450 ft  Mobility Ambulated with assistance in hallway  Mobility Response Tolerated well  Mobility performed by Mobility specialist  $Mobility charge 1 Mobility   Pt ambulated 450 ft in hallway using RW. Pt did not c/o of SOB or dizziness, pt was very eager to walk and go home. Pt returned to bed after ambulation with bed alarm on and RN in room.   Arliss Journey Mobility Specialist Acute Rehabilitation Services Office: [phone] 09/13/20, 10:53 AM

## 2020-09-13 NOTE — Discharge Summary (Signed)
Physician Discharge Summary  Wanda Valdez ION:[id number] DOB: 1945-10-29 DOA: 09/10/2020  PCP: Doreen Salvage, PA-C  Admit date: 09/10/2020 Discharge date: 09/13/2020  Admitted From: Home Disposition: Home  Recommendations for Outpatient Follow-up:  Follow up with PCP in 1-2 weeks Follow-up on pending pathology results Follow-up with GI as needed.  Home Health: None Equipment/Devices: None Discharge Condition: Stable CODE STATUS: Full code Diet recommendation: GI soft diet  Brief/Interim Summary: Past medical history of HTN, hysterectomy, peptic ulcer disease, cholecystectomy.  Presents with complaints of difficulty swallowing and weight loss of 20 pounds since February 2022.  GI consulted.  Dysphagia to solids -Underwent EGD which is negative for any stricture.  Normal esophagus, mild gastritis and mild duodenitis.  Underwent colonoscopy which showed few scattered areas of erythema in the ascending and transverse colon.  Biopsies taken-pathology pending -GI recommended oral PPI once daily and follow-up outpatient as needed. -Patient tolerated GI soft diet after the procedure -Discussed with [facility] PA-okay for DC from GI standpoint.  Mild colitis: -Negative GI pathogen panel, C. difficile PCR.  IV fluids was given. -Supportive care.  Severe hypokalemia Hypomagnesemia  AKI Elevated liver enzymes -Resolved  Hypertension -Blood pressure remained stable  Right rib pain -Rib films negative acute fracture. Old healing rib fractures on the right -Tylenol as needed for pain control   Discharge Diagnoses:  Dysphagia to solids Mild colitis Hypokalemia Hypomagnesemia AKI Elevated liver enzymes Hypertension Right rib pain Tobacco abuse   Discharge Instructions  Discharge Instructions     Discharge instructions   Complete by: As directed    Follow-up with PCP in 1 to 2 weeks Follow-up on pending pathology results GI soft diet Follow-up with GI as needed    Increase activity slowly   Complete by: As directed       Allergies as of 09/13/2020   No Known Allergies      Medication List     STOP taking these medications    omeprazole 40 MG capsule Commonly known as: PRILOSEC       TAKE these medications    alendronate 35 MG tablet Commonly known as: FOSAMAX Take 35 mg by mouth once a week.   allopurinol 100 MG tablet Commonly known as: ZYLOPRIM Take 100 mg by mouth daily.   carvedilol 6.25 MG tablet Commonly known as: COREG Take 6.25 mg by mouth 2 (two) times daily.   Creon 6000-19000 units Cpep Generic drug: Pancrelipase (Lip-Prot-Amyl) Take 2 capsules by mouth 3 (three) times daily.   doxycycline 50 MG capsule Commonly known as: MONODOX Take 50 mg by mouth 2 (two) times daily.   M-Natal Plus 27-1 MG Tabs Take 1 tablet by mouth 2 (two) times daily.   multivitamin with minerals Tabs tablet Take 1 tablet by mouth daily. Start taking on: September 14, 2020   pantoprazole 40 MG tablet Commonly known as: PROTONIX Take 1 tablet (40 mg total) by mouth daily. Start taking on: September 14, 2020   potassium chloride SA 20 MEQ tablet Commonly known as: KLOR-CON Take 20 mEq by mouth daily.   predniSONE 10 MG tablet Commonly known as: DELTASONE Take 10 mg by mouth daily with breakfast.   sucralfate 1 g tablet Commonly known as: CARAFATE Take 1 g by mouth 3 (three) times daily. Dissolve 1 tablet in 2 ounces of water       ASK your doctor about these medications    furosemide 20 MG tablet Commonly known as: LASIX Take 20 mg by mouth daily.  No Known Allergies  Consultations: GI   Procedures/Studies: X-ray chest PA and lateral  Result Date: 09/10/2020 CLINICAL DATA:  75 year old female with shortness of breath. EXAM: CHEST - 2 VIEW COMPARISON:  None FINDINGS: No focal consolidation, pleural effusion or pneumothorax. Background of emphysema. The cardiac silhouette is within limits. Atherosclerotic  calcification of the aorta. The aorta is tortuous. Osteopenia with degenerative changes of the spine. No acute osseous pathology. IMPRESSION: No active cardiopulmonary disease. Electronically Signed   By: Elgie Collard M.D.   On: 09/10/2020 20:19   DG Ribs Unilateral Right  Result Date: 09/10/2020 CLINICAL DATA:  Shortness of breath and right rib pain, initial encounter EXAM: RIGHT RIBS - 2 VIEW COMPARISON:  None. FINDINGS: Areas of sclerosis are noted in the anterior aspect of the right fourth, fifth and seventh ribs consistent with prior fractures and healing. No acute fractures are noted. No soft tissue abnormality is seen. The lungs are clear. No pneumothorax is noted. IMPRESSION: Old healing rib fractures on the right. No acute abnormality is noted. Electronically Signed   By: Alcide Clever M.D.   On: 09/10/2020 20:19   CT Abdomen Pelvis W Contrast  Result Date: 09/10/2020 CLINICAL DATA:  Decreased appetite with diarrhea, abdominal pain and decreased urine output for 3 weeks. Bowel obstruction suspected. Patient fell 6 months ago with continued back pain. Mildly elevated lipase and transaminase levels with normal total bilirubin and alkaline phosphatase. EXAM: CT ABDOMEN AND PELVIS WITH CONTRAST TECHNIQUE: Multidetector CT imaging of the abdomen and pelvis was performed using the standard protocol following bolus administration of intravenous contrast. CONTRAST:  75mL OMNIPAQUE IOHEXOL 300 MG/ML  SOLN COMPARISON:  None available. Limited correlation made with the reports from a chest CTA 01/28/2014 and abdominal CT 10/11/2012. FINDINGS: Lower chest: Clear lung bases. No significant pleural or pericardial effusion. There is atherosclerosis of the aorta and coronary arteries. Hepatobiliary: The liver is normal in density without suspicious focal abnormality. Moderate intra and extrahepatic biliary dilatation status post cholecystectomy. The common hepatic duct measures up to 1.6 cm in diameter and  tapers distally. No evidence of choledocholithiasis. Pancreas: The pancreatic duct is dilated to 6 mm in the pancreatic head. No pancreatic or ampullary mass identified. The pancreas otherwise appears unremarkable, without surrounding inflammation. Spleen: Normal in size without focal abnormality. Adrenals/Urinary Tract: Both adrenal glands appear normal. Both kidneys demonstrate cortical scarring and scattered small cysts. No evidence of urinary tract calculus or hydronephrosis. The bladder appears unremarkable for its degree of distention. Stomach/Bowel: Enteric contrast was administered and has passed into the rectum. No evidence of bowel obstruction. The stomach appears unremarkable for its degree of distention. No small bowel wall thickening, distention or surrounding inflammation. There is possible wall thickening in the ascending colon. No focal mass lesion identified. There is moderate stool in the rectum. Vascular/Lymphatic: There are no enlarged abdominal or pelvic lymph nodes. Diffuse aortic and branch vessel atherosclerosis without aneurysm or evidence of large vessel occlusion. The portal, superior mesenteric and splenic veins are patent. Reproductive: Hysterectomy. Probable residual ovarian tissue bilaterally. No suspicious adnexal findings. Other: Extensive postsurgical changes in the anterior abdominal wall consistent with previous hernia repair. No evidence of recurrent hernia. No ascites, free air or focal extraluminal fluid collection. Musculoskeletal: Severe convex right thoracolumbar scoliosis with associated spondylosis. There is irregular sclerosis involving the sacral ala bilaterally, suspicious for bilateral sacral insufficiency fractures. IMPRESSION: 1. No evidence of bowel obstruction. 2. Possible wall thickening in the right colon which could represent mild colitis. No  surrounding inflammatory changes identified. 3. Moderate intrahepatic and extrahepatic biliary dilatation with mild  pancreatic ductal dilatation. Findings could be physiologic given the laboratory absence of biliary obstruction. Cannot exclude small ampullary or distal common bile duct lesion. 4. Bilateral renal cortical thinning.  No hydronephrosis. 5. Suspected bilateral sacral insufficiency fractures. Lumbar scoliosis and spondylosis without acute lumbar spine osseous findings. 6. Extensive Aortic Atherosclerosis (ICD10-I70.0). Electronically Signed   By: Carey Bullocks M.D.   On: 09/10/2020 13:11   MR 3D Recon At Scanner  Result Date: 09/11/2020 CLINICAL DATA:  Biliary dilatation on CT. Decreased appetite with diarrhea, abdominal pain and decreased urine output for 3 weeks. EXAM: MRI ABDOMEN WITHOUT AND WITH CONTRAST (INCLUDING MRCP) TECHNIQUE: Multiplanar multisequence MR imaging of the abdomen was performed both before and after the administration of intravenous contrast. Heavily T2-weighted images of the biliary and pancreatic ducts were obtained, and three-dimensional MRCP images were rendered by post processing. CONTRAST:  110mL GADAVIST GADOBUTROL 1 MMOL/ML IV SOLN COMPARISON:  Abdominopelvic CT 09/10/2020 FINDINGS: Lower chest:  The visualized lower chest appears unremarkable. Hepatobiliary: Mild hepatic steatosis with loss of signal on the gradient echo opposed phase images. No focal hepatic lesions or abnormal enhancement identified. Status post cholecystectomy. Again demonstrated is moderate intra and extrahepatic biliary dilatation. The common hepatic duct measures up to 1.4 cm in diameter. No evidence of choledocholithiasis. Pancreas: Mild dilatation of the main pancreatic duct to 6 mm. There is a small cystic lesion in the uncinate process of the pancreas, measuring 1.3 x 0.6 cm on image 14/3. No evidence of enhancing mass or surrounding inflammation. Spleen: Normal in size without focal abnormality. Adrenals/Urinary Tract: Both adrenal glands appear normal. Bilateral renal cortical scarring and cysts. No  hydronephrosis or enhancing mass identified. Stomach/Bowel: No evidence of ampullary mass. Mild wall thickening of the ascending colon again noted, similar to recent CT. The rectum appears mildly distended with liquid stool on the coronal images. No evidence of bowel obstruction. Vascular/Lymphatic: There are no enlarged abdominal lymph nodes. Diffuse aortic and branch vessel atherosclerosis. Other: There are postsurgical changes in the anterior abdominal wall consistent with previous hernia repair. There is associated susceptibility artifact. There is additional artifact within the posterior right chest wall. Musculoskeletal: Convex right thoracolumbar scoliosis with associated spondylosis. Bilateral sacral insufficiency fractures are again noted as seen on CT. These do not appear to be associated with much marrow edema and could be subacute, although are incompletely evaluated by this abdominal study. IMPRESSION: 1. Stable intra and extrahepatic biliary dilatation with mild pancreatic ductal dilatation. No evidence of choledocholithiasis or other clear explanation. Continued follow-up of the patient's liver function studies recommended, currently not suggesting biliary obstruction. 2. Small cystic lesion in the uncinate process of the pancreas without aggressive characteristics. Recommend follow-up imaging in 2 years per consensus guidelines. This recommendation follows ACR consensus guidelines: Management of Incidental Pancreatic Cysts: A White Paper of the ACR Incidental Findings Committee. J Am Coll Radiol 2017;14:911-923. 3. Persistent mild wall thickening of the right colon which could reflect mild colitis. 4. Stable additional incidental findings including renal cortical scarring, aortic atherosclerosis and probable bilateral sacral insufficiency fractures. Electronically Signed   By: Carey Bullocks M.D.   On: 09/11/2020 18:09   MR ABDOMEN MRCP W WO CONTAST  Result Date: 09/11/2020 CLINICAL DATA:   Biliary dilatation on CT. Decreased appetite with diarrhea, abdominal pain and decreased urine output for 3 weeks. EXAM: MRI ABDOMEN WITHOUT AND WITH CONTRAST (INCLUDING MRCP) TECHNIQUE: Multiplanar multisequence MR imaging of the  abdomen was performed both before and after the administration of intravenous contrast. Heavily T2-weighted images of the biliary and pancreatic ducts were obtained, and three-dimensional MRCP images were rendered by post processing. CONTRAST:  21mL GADAVIST GADOBUTROL 1 MMOL/ML IV SOLN COMPARISON:  Abdominopelvic CT 09/10/2020 FINDINGS: Lower chest:  The visualized lower chest appears unremarkable. Hepatobiliary: Mild hepatic steatosis with loss of signal on the gradient echo opposed phase images. No focal hepatic lesions or abnormal enhancement identified. Status post cholecystectomy. Again demonstrated is moderate intra and extrahepatic biliary dilatation. The common hepatic duct measures up to 1.4 cm in diameter. No evidence of choledocholithiasis. Pancreas: Mild dilatation of the main pancreatic duct to 6 mm. There is a small cystic lesion in the uncinate process of the pancreas, measuring 1.3 x 0.6 cm on image 14/3. No evidence of enhancing mass or surrounding inflammation. Spleen: Normal in size without focal abnormality. Adrenals/Urinary Tract: Both adrenal glands appear normal. Bilateral renal cortical scarring and cysts. No hydronephrosis or enhancing mass identified. Stomach/Bowel: No evidence of ampullary mass. Mild wall thickening of the ascending colon again noted, similar to recent CT. The rectum appears mildly distended with liquid stool on the coronal images. No evidence of bowel obstruction. Vascular/Lymphatic: There are no enlarged abdominal lymph nodes. Diffuse aortic and branch vessel atherosclerosis. Other: There are postsurgical changes in the anterior abdominal wall consistent with previous hernia repair. There is associated susceptibility artifact. There is  additional artifact within the posterior right chest wall. Musculoskeletal: Convex right thoracolumbar scoliosis with associated spondylosis. Bilateral sacral insufficiency fractures are again noted as seen on CT. These do not appear to be associated with much marrow edema and could be subacute, although are incompletely evaluated by this abdominal study. IMPRESSION: 1. Stable intra and extrahepatic biliary dilatation with mild pancreatic ductal dilatation. No evidence of choledocholithiasis or other clear explanation. Continued follow-up of the patient's liver function studies recommended, currently not suggesting biliary obstruction. 2. Small cystic lesion in the uncinate process of the pancreas without aggressive characteristics. Recommend follow-up imaging in 2 years per consensus guidelines. This recommendation follows ACR consensus guidelines: Management of Incidental Pancreatic Cysts: A White Paper of the ACR Incidental Findings Committee. J Am Coll Radiol 2017;14:911-923. 3. Persistent mild wall thickening of the right colon which could reflect mild colitis. 4. Stable additional incidental findings including renal cortical scarring, aortic atherosclerosis and probable bilateral sacral insufficiency fractures. Electronically Signed   By: Carey Bullocks M.D.   On: 09/11/2020 18:09      Subjective: Patient seen and examined.  Sitting comfortably on the bed.  No new complaints.  Underwent colonoscopy this morning without any complications.  Tells me that she is doing fine, able to tolerate soft diet without any issues.  Wishes to go home.  Discharge Exam: Vitals:   09/13/20 0810 09/13/20 0829  BP: 134/65 136/76  Pulse: (!) 59 (!) 57  Resp: 18 16  Temp:  (!) 97.5 F (36.4 C)  SpO2: 100% 100%   Vitals:   09/13/20 0754 09/13/20 0800 09/13/20 0810 09/13/20 0829  BP: 127/70 (!) 147/64 134/65 136/76  Pulse: 64 65 (!) 59 (!) 57  Resp: 16 12 18 16   Temp: 97.8 F (36.6 C)   (!) 97.5 F (36.4 C)   TempSrc: Oral   Oral  SpO2: 100% 100% 100% 100%  Weight:      Height:        General: Pt is alert, awake, not in acute distress, on room air, communicating well Cardiovascular: RRR, S1/S2 +,  no rubs, no gallops Respiratory: CTA bilaterally, no wheezing, no rhonchi Abdominal: Soft, NT, ND, bowel sounds + Extremities: no edema, no cyanosis    The results of significant diagnostics from this hospitalization (including imaging, microbiology, ancillary and laboratory) are listed below for reference.     Microbiology: Recent Results (from the past 240 hour(s))  Resp Panel by RT-PCR (Flu A&B, Covid) Nasopharyngeal Swab     Status: None   Collection Time: 09/10/20  1:33 PM   Specimen: Nasopharyngeal Swab; Nasopharyngeal(NP) swabs in vial transport medium  Result Value Ref Range Status   SARS Coronavirus 2 by RT PCR NEGATIVE NEGATIVE Final    Comment: (NOTE) SARS-CoV-2 target nucleic acids are NOT DETECTED.  The SARS-CoV-2 RNA is generally detectable in upper respiratory specimens during the acute phase of infection. The lowest concentration of SARS-CoV-2 viral copies this assay can detect is 138 copies/mL. A negative result does not preclude SARS-Cov-2 infection and should not be used as the sole basis for treatment or other patient management decisions. A negative result may occur with  improper specimen collection/handling, submission of specimen other than nasopharyngeal swab, presence of viral mutation(s) within the areas targeted by this assay, and inadequate number of viral copies(<138 copies/mL). A negative result must be combined with clinical observations, patient history, and epidemiological information. The expected result is Negative.  Fact Sheet for Patients:  BloggerCourse.com  Fact Sheet for Healthcare Providers:  SeriousBroker.it  This test is no t yet approved or cleared by the [place] FDA and  has been  authorized for detection and/or diagnosis of SARS-CoV-2 by FDA under an Emergency Use Authorization (EUA). This EUA will remain  in effect (meaning this test can be used) for the duration of the COVID-19 declaration under Section 564(b)(1) of the Act, 21 U.S.C.section 360bbb-3(b)(1), unless the authorization is terminated  or revoked sooner.       Influenza A by PCR NEGATIVE NEGATIVE Final   Influenza B by PCR NEGATIVE NEGATIVE Final    Comment: (NOTE) The Xpert Xpress SARS-CoV-2/FLU/RSV plus assay is intended as an aid in the diagnosis of influenza from Nasopharyngeal swab specimens and should not be used as a sole basis for treatment. Nasal washings and aspirates are unacceptable for Xpert Xpress SARS-CoV-2/FLU/RSV testing.  Fact Sheet for Patients: BloggerCourse.com  Fact Sheet for Healthcare Providers: SeriousBroker.it  This test is not yet approved or cleared by the [place] FDA and has been authorized for detection and/or diagnosis of SARS-CoV-2 by FDA under an Emergency Use Authorization (EUA). This EUA will remain in effect (meaning this test can be used) for the duration of the COVID-19 declaration under Section 564(b)(1) of the Act, 21 U.S.C. section 360bbb-3(b)(1), unless the authorization is terminated or revoked.  Performed at [facility], [address] [place], [place] [address]   Gastrointestinal Panel by PCR , Stool     Status: None   Collection Time: 09/10/20  5:35 PM   Specimen: Stool  Result Value Ref Range Status   Campylobacter species NOT DETECTED NOT DETECTED Final   Plesimonas shigelloides NOT DETECTED NOT DETECTED Final   Salmonella species NOT DETECTED NOT DETECTED Final   Yersinia enterocolitica NOT DETECTED NOT DETECTED Final   Vibrio species NOT DETECTED NOT DETECTED Final   Vibrio cholerae NOT DETECTED NOT DETECTED Final   Enteroaggregative E coli (EAEC) NOT DETECTED NOT  DETECTED Final   Enteropathogenic E coli (EPEC) NOT DETECTED NOT DETECTED Final   Enterotoxigenic E coli (ETEC) NOT DETECTED  NOT DETECTED Final   Shiga like toxin producing E coli (STEC) NOT DETECTED NOT DETECTED Final   Shigella/Enteroinvasive E coli (EIEC) NOT DETECTED NOT DETECTED Final   Cryptosporidium NOT DETECTED NOT DETECTED Final   Cyclospora cayetanensis NOT DETECTED NOT DETECTED Final   Entamoeba histolytica NOT DETECTED NOT DETECTED Final   Giardia lamblia NOT DETECTED NOT DETECTED Final   Adenovirus F40/41 NOT DETECTED NOT DETECTED Final   Astrovirus NOT DETECTED NOT DETECTED Final   Norovirus GI/GII NOT DETECTED NOT DETECTED Final   Rotavirus A NOT DETECTED NOT DETECTED Final   Sapovirus (I, II, IV, and V) NOT DETECTED NOT DETECTED Final    Comment: Performed at [facility], [address] [place], [place] [address]  C Difficile Quick Screen w PCR reflex     Status: None   Collection Time: 09/10/20  5:35 PM   Specimen: Stool  Result Value Ref Range Status   C Diff antigen NEGATIVE NEGATIVE Final   C Diff toxin NEGATIVE NEGATIVE Final   C Diff interpretation No C. difficile detected.  Final    Comment: Performed at [facility], [address] [place], [place] [address]     Labs: BNP (last 3 results) No results for input(s): BNP in the last 8760 hours. Basic Metabolic Panel: Recent Labs  Lab 09/10/20 1114 09/10/20 1908 09/11/20 0502 09/12/20 0440 09/13/20 0428  NA 136 134* 137 139 139  K 2.3* 3.8 3.5 3.0* 3.7  CL 105 108 116* 117* 115*  CO2 20* 18* 16* 17* 19*  GLUCOSE 111* 92 95 92 91  BUN 26* 23 19 14 10   CREATININE 1.58* 1.43* 1.03* 1.01* 0.97  CALCIUM 9.2 8.7* 8.3* 8.6* 8.9  MG  --  1.5* 1.4* 1.8 1.7  PHOS  --  2.2* 2.0*  --   --     Liver Function Tests: Recent Labs  Lab 09/10/20 1114 09/10/20 1908 09/11/20 0502 09/12/20 0440  AST 51*  --  70* 38  ALT 53*  --  58* 44  ALKPHOS 117  --  116 101  BILITOT 0.6  --   0.8 0.5  PROT 6.2*  --  4.9* 4.6*  ALBUMIN 2.9* 2.5* 2.4* 2.2*    Recent Labs  Lab 09/10/20 1114  LIPASE 56*    No results for input(s): AMMONIA in the last 168 hours. CBC: Recent Labs  Lab 09/10/20 1114 09/11/20 0502 09/12/20 0440 09/13/20 0428  WBC 6.2 3.8* 4.3 3.8*  NEUTROABS 4.2  --   --   --   HGB 12.1 9.6* 8.9* 9.8*  HCT 34.7* 29.8* 27.1* 30.1*  MCV 97.2 104.2* 102.7* 103.4*  PLT 192 151 152 158    Cardiac Enzymes: No results for input(s): CKTOTAL, CKMB, CKMBINDEX, TROPONINI in the last 168 hours. BNP: Invalid input(s): POCBNP CBG: Recent Labs  Lab 09/11/20 1146 09/12/20 0826 09/13/20 0827 09/13/20 1131  GLUCAP 106* 100* 79 81    D-Dimer No results for input(s): DDIMER in the last 72 hours. Hgb A1c No results for input(s): HGBA1C in the last 72 hours. Lipid Profile No results for input(s): CHOL, HDL, LDLCALC, TRIG, CHOLHDL, LDLDIRECT in the last 72 hours. Thyroid function studies Recent Labs    09/11/20 0758  TSH 1.213    Anemia work up Recent Labs    09/11/20 0758  VITAMINB12 590  FERRITIN 188  TIBC 127*  IRON 76  RETICCTPCT 2.4    Urinalysis    Component Value Date/Time   COLORURINE  YELLOW 09/10/2020 1609   APPEARANCEUR CLEAR 09/10/2020 1609   LABSPEC <1.005 (L) 09/10/2020 1609   PHURINE 6.0 09/10/2020 1609   GLUCOSEU NEGATIVE 09/10/2020 1609   HGBUR NEGATIVE 09/10/2020 1609   BILIRUBINUR NEGATIVE 09/10/2020 1609   KETONESUR NEGATIVE 09/10/2020 1609   PROTEINUR NEGATIVE 09/10/2020 1609   NITRITE NEGATIVE 09/10/2020 1609   LEUKOCYTESUR TRACE (A) 09/10/2020 1609   Sepsis Labs Invalid input(s): PROCALCITONIN,  WBC,  LACTICIDVEN Microbiology Recent Results (from the past 240 hour(s))  Resp Panel by RT-PCR (Flu A&B, Covid) Nasopharyngeal Swab     Status: None   Collection Time: 09/10/20  1:33 PM   Specimen: Nasopharyngeal Swab; Nasopharyngeal(NP) swabs in vial transport medium  Result Value Ref Range Status   SARS Coronavirus  2 by RT PCR NEGATIVE NEGATIVE Final    Comment: (NOTE) SARS-CoV-2 target nucleic acids are NOT DETECTED.  The SARS-CoV-2 RNA is generally detectable in upper respiratory specimens during the acute phase of infection. The lowest concentration of SARS-CoV-2 viral copies this assay can detect is 138 copies/mL. A negative result does not preclude SARS-Cov-2 infection and should not be used as the sole basis for treatment or other patient management decisions. A negative result may occur with  improper specimen collection/handling, submission of specimen other than nasopharyngeal swab, presence of viral mutation(s) within the areas targeted by this assay, and inadequate number of viral copies(<138 copies/mL). A negative result must be combined with clinical observations, patient history, and epidemiological information. The expected result is Negative.  Fact Sheet for Patients:  BloggerCourse.com  Fact Sheet for Healthcare Providers:  SeriousBroker.it  This test is no t yet approved or cleared by the [place] FDA and  has been authorized for detection and/or diagnosis of SARS-CoV-2 by FDA under an Emergency Use Authorization (EUA). This EUA will remain  in effect (meaning this test can be used) for the duration of the COVID-19 declaration under Section 564(b)(1) of the Act, 21 U.S.C.section 360bbb-3(b)(1), unless the authorization is terminated  or revoked sooner.       Influenza A by PCR NEGATIVE NEGATIVE Final   Influenza B by PCR NEGATIVE NEGATIVE Final    Comment: (NOTE) The Xpert Xpress SARS-CoV-2/FLU/RSV plus assay is intended as an aid in the diagnosis of influenza from Nasopharyngeal swab specimens and should not be used as a sole basis for treatment. Nasal washings and aspirates are unacceptable for Xpert Xpress SARS-CoV-2/FLU/RSV testing.  Fact Sheet for Patients: BloggerCourse.com  Fact  Sheet for Healthcare Providers: SeriousBroker.it  This test is not yet approved or cleared by the [place] FDA and has been authorized for detection and/or diagnosis of SARS-CoV-2 by FDA under an Emergency Use Authorization (EUA). This EUA will remain in effect (meaning this test can be used) for the duration of the COVID-19 declaration under Section 564(b)(1) of the Act, 21 U.S.C. section 360bbb-3(b)(1), unless the authorization is terminated or revoked.  Performed at [facility], [address] [place], [place] [address]   Gastrointestinal Panel by PCR , Stool     Status: None   Collection Time: 09/10/20  5:35 PM   Specimen: Stool  Result Value Ref Range Status   Campylobacter species NOT DETECTED NOT DETECTED Final   Plesimonas shigelloides NOT DETECTED NOT DETECTED Final   Salmonella species NOT DETECTED NOT DETECTED Final   Yersinia enterocolitica NOT DETECTED NOT DETECTED Final   Vibrio species NOT DETECTED NOT DETECTED Final   Vibrio cholerae NOT DETECTED NOT DETECTED Final   Enteroaggregative  E coli (EAEC) NOT DETECTED NOT DETECTED Final   Enteropathogenic E coli (EPEC) NOT DETECTED NOT DETECTED Final   Enterotoxigenic E coli (ETEC) NOT DETECTED NOT DETECTED Final   Shiga like toxin producing E coli (STEC) NOT DETECTED NOT DETECTED Final   Shigella/Enteroinvasive E coli (EIEC) NOT DETECTED NOT DETECTED Final   Cryptosporidium NOT DETECTED NOT DETECTED Final   Cyclospora cayetanensis NOT DETECTED NOT DETECTED Final   Entamoeba histolytica NOT DETECTED NOT DETECTED Final   Giardia lamblia NOT DETECTED NOT DETECTED Final   Adenovirus F40/41 NOT DETECTED NOT DETECTED Final   Astrovirus NOT DETECTED NOT DETECTED Final   Norovirus GI/GII NOT DETECTED NOT DETECTED Final   Rotavirus A NOT DETECTED NOT DETECTED Final   Sapovirus (I, II, IV, and V) NOT DETECTED NOT DETECTED Final    Comment: Performed at [facility], [address], [place], [place] [address]  C Difficile Quick Screen w PCR reflex     Status: None   Collection Time: 09/10/20  5:35 PM   Specimen: Stool  Result Value Ref Range Status   C Diff antigen NEGATIVE NEGATIVE Final   C Diff toxin NEGATIVE NEGATIVE Final   C Diff interpretation No C. difficile detected.  Final    Comment: Performed at [facility], [address] [place], [place] [address]     Time coordinating discharge: Over 30 minutes  SIGNED:   Ollen Bowl, MD  [facility] 09/13/2020, 4:34 PM Pager   If 7PM-7AM, please contact night-coverage [email or web address]

## 2020-09-13 NOTE — Transfer of Care (Signed)
Immediate Anesthesia Transfer of Care Note  Patient: Wanda Valdez  Procedure(s) Performed: Procedure(s): COLONOSCOPY (N/A) BIOPSY POLYPECTOMY  Patient Location: PACU and Endoscopy Unit  Anesthesia Type:MAC  Level of Consciousness: awake, alert  and oriented  Airway & Oxygen Therapy: Patient Spontanous Breathing and Patient connected to nasal cannula oxygen  Post-op Assessment: Report given to RN and Post -op Vital signs reviewed and stable  Post vital signs: Reviewed and stable  Last Vitals:  Vitals:   09/13/20 0657 09/13/20 0700  BP:  (!) 146/76  Pulse: 65   Resp: (!) 23   Temp: 36.6 C   SpO2: 612%     Complications: No apparent anesthesia complications

## 2020-09-16 LAB — SURGICAL PATHOLOGY

## 2020-09-19 ENCOUNTER — Emergency Department (HOSPITAL_COMMUNITY): Payer: Medicare Other

## 2020-09-19 ENCOUNTER — Inpatient Hospital Stay (HOSPITAL_COMMUNITY)
Admission: EM | Admit: 2020-09-19 | Discharge: 2020-09-22 | DRG: 640 | Disposition: A | Discharge status: Home Health | Payer: Medicare Other | Attending: Family Medicine | Admitting: Family Medicine

## 2020-09-19 ENCOUNTER — Other Ambulatory Visit: Payer: Self-pay

## 2020-09-19 ENCOUNTER — Encounter (HOSPITAL_COMMUNITY): Payer: Self-pay | Admitting: Emergency Medicine

## 2020-09-19 DIAGNOSIS — I10 Essential (primary) hypertension: Secondary | ICD-10-CM | POA: Diagnosis present

## 2020-09-19 DIAGNOSIS — Z9049 Acquired absence of other specified parts of digestive tract: Secondary | ICD-10-CM

## 2020-09-19 DIAGNOSIS — Z8249 Family history of ischemic heart disease and other diseases of the circulatory system: Secondary | ICD-10-CM

## 2020-09-19 DIAGNOSIS — F17210 Nicotine dependence, cigarettes, uncomplicated: Secondary | ICD-10-CM | POA: Diagnosis present

## 2020-09-19 DIAGNOSIS — K648 Other hemorrhoids: Secondary | ICD-10-CM | POA: Diagnosis present

## 2020-09-19 DIAGNOSIS — E860 Dehydration: Secondary | ICD-10-CM | POA: Diagnosis not present

## 2020-09-19 DIAGNOSIS — Z7952 Long term (current) use of systemic steroids: Secondary | ICD-10-CM | POA: Diagnosis not present

## 2020-09-19 DIAGNOSIS — D6489 Other specified anemias: Secondary | ICD-10-CM | POA: Diagnosis present

## 2020-09-19 DIAGNOSIS — R197 Diarrhea, unspecified: Secondary | ICD-10-CM | POA: Diagnosis present

## 2020-09-19 DIAGNOSIS — Z681 Body mass index (BMI) 19 or less, adult: Secondary | ICD-10-CM

## 2020-09-19 DIAGNOSIS — Z20822 Contact with and (suspected) exposure to covid-19: Secondary | ICD-10-CM | POA: Diagnosis present

## 2020-09-19 DIAGNOSIS — A419 Sepsis, unspecified organism: Secondary | ICD-10-CM

## 2020-09-19 DIAGNOSIS — N390 Urinary tract infection, site not specified: Secondary | ICD-10-CM

## 2020-09-19 DIAGNOSIS — I959 Hypotension, unspecified: Secondary | ICD-10-CM

## 2020-09-19 DIAGNOSIS — R1310 Dysphagia, unspecified: Secondary | ICD-10-CM

## 2020-09-19 DIAGNOSIS — K2950 Unspecified chronic gastritis without bleeding: Secondary | ICD-10-CM | POA: Diagnosis present

## 2020-09-19 DIAGNOSIS — Z90710 Acquired absence of both cervix and uterus: Secondary | ICD-10-CM

## 2020-09-19 DIAGNOSIS — Z79899 Other long term (current) drug therapy: Secondary | ICD-10-CM

## 2020-09-19 DIAGNOSIS — E876 Hypokalemia: Secondary | ICD-10-CM | POA: Diagnosis present

## 2020-09-19 DIAGNOSIS — Z86010 Personal history of colonic polyps: Secondary | ICD-10-CM

## 2020-09-19 DIAGNOSIS — Z8711 Personal history of peptic ulcer disease: Secondary | ICD-10-CM

## 2020-09-19 DIAGNOSIS — K529 Noninfective gastroenteritis and colitis, unspecified: Secondary | ICD-10-CM | POA: Diagnosis present

## 2020-09-19 DIAGNOSIS — R55 Syncope and collapse: Secondary | ICD-10-CM | POA: Diagnosis present

## 2020-09-19 DIAGNOSIS — K52832 Lymphocytic colitis: Secondary | ICD-10-CM | POA: Diagnosis present

## 2020-09-19 DIAGNOSIS — E43 Unspecified severe protein-calorie malnutrition: Secondary | ICD-10-CM | POA: Diagnosis present

## 2020-09-19 LAB — CBC WITH DIFFERENTIAL/PLATELET
Abs Immature Granulocytes: 0.06 10*3/uL (ref 0.00–0.07)
Basophils Absolute: 0 10*3/uL (ref 0.0–0.1)
Basophils Relative: 1 %
Eosinophils Absolute: 0.2 10*3/uL (ref 0.0–0.5)
Eosinophils Relative: 3 %
HCT: 28.1 % — ABNORMAL LOW (ref 36.0–46.0)
Hemoglobin: 9.2 g/dL — ABNORMAL LOW (ref 12.0–15.0)
Immature Granulocytes: 1 %
Lymphocytes Relative: 37 %
Lymphs Abs: 1.8 10*3/uL (ref 0.7–4.0)
MCH: 34.6 pg — ABNORMAL HIGH (ref 26.0–34.0)
MCHC: 32.7 g/dL (ref 30.0–36.0)
MCV: 105.6 fL — ABNORMAL HIGH (ref 80.0–100.0)
Monocytes Absolute: 0.5 10*3/uL (ref 0.1–1.0)
Monocytes Relative: 11 %
Neutro Abs: 2.3 10*3/uL (ref 1.7–7.7)
Neutrophils Relative %: 47 %
Platelets: 174 10*3/uL (ref 150–400)
RBC: 2.66 MIL/uL — ABNORMAL LOW (ref 3.87–5.11)
RDW: 15.6 % — ABNORMAL HIGH (ref 11.5–15.5)
WBC: 4.9 10*3/uL (ref 4.0–10.5)
nRBC: 0 % (ref 0.0–0.2)

## 2020-09-19 LAB — URINALYSIS, ROUTINE W REFLEX MICROSCOPIC
Glucose, UA: NEGATIVE mg/dL
Ketones, ur: NEGATIVE mg/dL
Nitrite: NEGATIVE
Protein, ur: NEGATIVE mg/dL
Specific Gravity, Urine: 1.011 (ref 1.005–1.030)
pH: 7 (ref 5.0–8.0)

## 2020-09-19 LAB — COMPREHENSIVE METABOLIC PANEL
ALT: 29 U/L (ref 0–44)
AST: 31 U/L (ref 15–41)
Albumin: 2.3 g/dL — ABNORMAL LOW (ref 3.5–5.0)
Alkaline Phosphatase: 79 U/L (ref 38–126)
Anion gap: 9 (ref 5–15)
BUN: 7 mg/dL — ABNORMAL LOW (ref 8–23)
CO2: 20 mmol/L — ABNORMAL LOW (ref 22–32)
Calcium: 8.2 mg/dL — ABNORMAL LOW (ref 8.9–10.3)
Chloride: 113 mmol/L — ABNORMAL HIGH (ref 98–111)
Creatinine, Ser: 0.89 mg/dL (ref 0.44–1.00)
GFR, Estimated: >60 mL/min (ref >60)
Glucose, Bld: 94 mg/dL (ref 70–99)
Potassium: 3.1 mmol/L — ABNORMAL LOW (ref 3.5–5.1)
Sodium: 142 mmol/L (ref 135–145)
Total Bilirubin: 0.3 mg/dL (ref 0.3–1.2)
Total Protein: 4.6 g/dL — ABNORMAL LOW (ref 6.5–8.1)

## 2020-09-19 LAB — I-STAT CHEM 8, ED
BUN: 5 mg/dL — ABNORMAL LOW (ref 8–23)
Calcium, Ion: 1.04 mmol/L — ABNORMAL LOW (ref 1.15–1.40)
Chloride: 113 mmol/L — ABNORMAL HIGH (ref 98–111)
Creatinine, Ser: 0.9 mg/dL (ref 0.44–1.00)
Glucose, Bld: 83 mg/dL (ref 70–99)
HCT: 27 % — ABNORMAL LOW (ref 36.0–46.0)
Hemoglobin: 9.2 g/dL — ABNORMAL LOW (ref 12.0–15.0)
Potassium: 3.7 mmol/L (ref 3.5–5.1)
Sodium: 142 mmol/L (ref 135–145)
TCO2: 19 mmol/L — ABNORMAL LOW (ref 22–32)

## 2020-09-19 LAB — TROPONIN I (HIGH SENSITIVITY)
Troponin I (High Sensitivity): 9 ng/L (ref <18)
Troponin I (High Sensitivity): 6 ng/L (ref <18)

## 2020-09-19 LAB — RESP PANEL BY RT-PCR (FLU A&B, COVID) ARPGX2
Influenza A by PCR: NEGATIVE
Influenza B by PCR: NEGATIVE
SARS Coronavirus 2 by RT PCR: NEGATIVE

## 2020-09-19 LAB — LACTIC ACID, PLASMA
Lactic Acid, Venous: 3 mmol/L (ref 0.5–1.9)
Lactic Acid, Venous: 2.3 mmol/L (ref 0.5–1.9)

## 2020-09-19 LAB — C DIFFICILE QUICK SCREEN W PCR REFLEX
C Diff antigen: NEGATIVE
C Diff interpretation: NOT DETECTED
C Diff toxin: NEGATIVE

## 2020-09-19 MED ORDER — SODIUM CHLORIDE 0.9% FLUSH
3.0000 mL | Freq: Two times a day (BID) | INTRAVENOUS | Status: DC
  Administered 2020-09-19 – 2020-09-20 (×2): 3 mL via INTRAVENOUS

## 2020-09-19 MED ORDER — LOPERAMIDE HCL 2 MG PO CAPS
2.0000 mg | ORAL_CAPSULE | ORAL | Status: DC | PRN
  Administered 2020-09-21: 2 mg via ORAL
  Filled 2020-09-19: qty 1

## 2020-09-19 MED ORDER — ALLOPURINOL 100 MG PO TABS
100.0000 mg | ORAL_TABLET | Freq: Every day | ORAL | Status: DC
  Administered 2020-09-20 – 2020-09-22 (×3): 100 mg via ORAL
  Filled 2020-09-19 (×3): qty 1

## 2020-09-19 MED ORDER — SODIUM CHLORIDE 0.9 % IV BOLUS
1000.0000 mL | Freq: Once | INTRAVENOUS | Status: AC
  Administered 2020-09-19: 1000 mL via INTRAVENOUS

## 2020-09-19 MED ORDER — SODIUM CHLORIDE 0.9 % IV SOLN
2.0000 g | Freq: Once | INTRAVENOUS | Status: AC
  Administered 2020-09-19: 2 g via INTRAVENOUS
  Filled 2020-09-19: qty 2

## 2020-09-19 MED ORDER — POTASSIUM CHLORIDE CRYS ER 20 MEQ PO TBCR
20.0000 meq | EXTENDED_RELEASE_TABLET | Freq: Every day | ORAL | Status: DC
  Administered 2020-09-20: 20 meq via ORAL
  Filled 2020-09-19: qty 1

## 2020-09-19 MED ORDER — SUCRALFATE 1 G PO TABS
1.0000 g | ORAL_TABLET | Freq: Three times a day (TID) | ORAL | Status: DC
  Administered 2020-09-20 – 2020-09-22 (×8): 1 g via ORAL
  Filled 2020-09-19 (×8): qty 1

## 2020-09-19 MED ORDER — ONDANSETRON HCL 4 MG PO TABS: 4.0000 mg | ORAL_TABLET | Freq: Four times a day (QID) | ORAL | Status: DC | PRN

## 2020-09-19 MED ORDER — IOHEXOL 350 MG/ML SOLN
100.0000 mL | Freq: Once | INTRAVENOUS | Status: AC | PRN
  Administered 2020-09-19: 100 mL via INTRAVENOUS

## 2020-09-19 MED ORDER — PANCRELIPASE (LIP-PROT-AMYL) 12000-38000 UNITS PO CPEP
12000.0000 [IU] | ORAL_CAPSULE | Freq: Three times a day (TID) | ORAL | Status: DC
  Administered 2020-09-20 (×2): 12000 [IU] via ORAL
  Filled 2020-09-19 (×3): qty 1

## 2020-09-19 MED ORDER — ACETAMINOPHEN 650 MG RE SUPP: 650.0000 mg | Freq: Four times a day (QID) | RECTAL | Status: DC | PRN

## 2020-09-19 MED ORDER — LACTATED RINGERS IV SOLN: INTRAVENOUS | Status: DC

## 2020-09-19 MED ORDER — ACETAMINOPHEN 325 MG PO TABS
650.0000 mg | ORAL_TABLET | Freq: Four times a day (QID) | ORAL | Status: DC | PRN
  Administered 2020-09-21: 650 mg via ORAL
  Filled 2020-09-19: qty 2

## 2020-09-19 MED ORDER — ONDANSETRON HCL 4 MG/2ML IJ SOLN: 4.0000 mg | Freq: Four times a day (QID) | INTRAMUSCULAR | Status: DC | PRN

## 2020-09-19 MED ORDER — PANTOPRAZOLE SODIUM 40 MG PO TBEC
40.0000 mg | DELAYED_RELEASE_TABLET | Freq: Every day | ORAL | Status: DC
  Administered 2020-09-20 – 2020-09-22 (×3): 40 mg via ORAL
  Filled 2020-09-19 (×3): qty 1

## 2020-09-19 MED ORDER — PREDNISONE 5 MG PO TABS
10.0000 mg | ORAL_TABLET | Freq: Every day | ORAL | Status: DC
  Administered 2020-09-20 – 2020-09-22 (×3): 10 mg via ORAL
  Filled 2020-09-19: qty 1
  Filled 2020-09-19 (×2): qty 2

## 2020-09-19 MED ORDER — ENOXAPARIN SODIUM 30 MG/0.3ML IJ SOSY: 30.0000 mg | PREFILLED_SYRINGE | INTRAMUSCULAR | Status: DC

## 2020-09-19 MED ORDER — ADULT MULTIVITAMIN W/MINERALS CH
1.0000 | ORAL_TABLET | Freq: Every day | ORAL | Status: DC
  Administered 2020-09-20 – 2020-09-22 (×3): 1 via ORAL
  Filled 2020-09-19 (×3): qty 1

## 2020-09-19 NOTE — ED Notes (Signed)
Pt had stool in purewick and brief. Stool sample collected. Pt given new purewick after pericare.

## 2020-09-19 NOTE — ED Triage Notes (Signed)
BIBA Per EMS: Pt coming from home with complaints of a syncopal episode that occurred today. Pt was sitting outside for approx and then experienced syncopal episode. Pt daughter did CPR on pt per EMS. Pt denies chest pain or discomfort. Pt takes 20mg  lasix and carvedolol at home- had meds today. Blood pressure 89/58 upon arrival- bolus given. BP 77/61 upon arrival to ED.

## 2020-09-19 NOTE — ED Notes (Signed)
Patient transported to CT 

## 2020-09-19 NOTE — ED Provider Notes (Signed)
[facility]-EMERGENCY DEPT Provider Note   CSN: [id number] Arrival date & time: 09/19/20  1754     History Chief Complaint  Patient presents with  . Hypotension    Wanda Valdez is a 75 y.o. female history of hypertension, AKI, dysphagia here presenting with hypotension.  Patient was recently in the hospital for dehydration and dysphagia.  Patient states that she is weak today.  She felt lightheaded and dizzy.  Apparently she was sitting and passed out.  She was with her daughter and the daughter perform chest compression and she woke up.  Patient was noted to be hypotensive with a blood pressure in the 70s.  Patient was given 1 L normal saline bolus prior to arrival.  Patient denies any abdominal pain or chest pain.  Patient denies any head injury.  Patient had a recent sent CT abdomen pelvis that showed cystic lesion and the pancreas with no obvious biliary obstruction.  The history is provided by the patient.      Past Medical History:  Diagnosis Date  . Hypertension     Patient Active Problem List   Diagnosis Date Noted  . Protein-calorie malnutrition, severe 09/12/2020  . Hypokalemia 09/10/2020  . Dysphagia 09/10/2020  . Diarrhea 09/10/2020  . Dehydration 09/10/2020  . Transaminitis 09/10/2020  . AKI (acute kidney injury) (HCC) 09/10/2020  . Colitis: ?? 09/10/2020  . Rib pain on right side 09/10/2020  . SOB (shortness of breath)     Past Surgical History:  Procedure Laterality Date  . ABDOMINAL HYSTERECTOMY    . BIOPSY  09/11/2020   Procedure: BIOPSY;  Surgeon: Kathi Der, MD;  Location: [facility];  Service: Gastroenterology;;  . BIOPSY  09/13/2020   Procedure: BIOPSY;  Surgeon: Kathi Der, MD;  Location: [facility];  Service: Gastroenterology;;  . COLONOSCOPY N/A 09/13/2020   Procedure: COLONOSCOPY;  Surgeon: Kathi Der, MD;  Location: [facility];  Service: Gastroenterology;  Laterality: N/A;  .  ESOPHAGOGASTRODUODENOSCOPY N/A 09/11/2020   Procedure: ESOPHAGOGASTRODUODENOSCOPY (EGD);  Surgeon: Kathi Der, MD;  Location: [facility];  Service: Gastroenterology;  Laterality: N/A;  . POLYPECTOMY  09/13/2020   Procedure: POLYPECTOMY;  Surgeon: Kathi Der, MD;  Location: [facility];  Service: Gastroenterology;;     OB History   No obstetric history on file.     Family History  Problem Relation Age of Onset  . Cirrhosis Mother   . Heart attack Father     Social History   Tobacco Use  . Smoking status: Some Days    Types: Cigarettes  . Smokeless tobacco: Never  Vaping Use  . Vaping Use: Never used  Substance Use Topics  . Alcohol use: Yes    Comment: occassional  . Drug use: Never    Home Medications Prior to Admission medications   Medication Sig Start Date End Date Taking? Authorizing Provider  alendronate (FOSAMAX) 35 MG tablet Take 35 mg by mouth once a week. 03/28/20   [provider]  allopurinol (ZYLOPRIM) 100 MG tablet Take 100 mg by mouth daily. 08/10/20   [provider]  carvedilol (COREG) 6.25 MG tablet Take 6.25 mg by mouth 2 (two) times daily. 06/17/20   [provider]  CREON 6000-19000 units CPEP Take 2 capsules by mouth 3 (three) times daily. 05/11/20   [provider]  doxycycline (MONODOX) 50 MG capsule Take 50 mg by mouth 2 (two) times daily.    [provider]  furosemide (LASIX) 20 MG tablet Take 20 mg by mouth  daily. Patient not taking: Reported on 09/11/2020 08/10/20   [provider]  Multiple Vitamin (MULTIVITAMIN WITH MINERALS) TABS tablet Take 1 tablet by mouth daily. 09/14/20   Pahwani, Kasandra Knudsen, MD  pantoprazole (PROTONIX) 40 MG tablet Take 1 tablet (40 mg total) by mouth daily. 09/14/20   Pahwani, Kasandra Knudsen, MD  potassium chloride SA (KLOR-CON) 20 MEQ tablet Take 20 mEq by mouth daily. 07/19/20   [provider]  predniSONE (DELTASONE) 10 MG tablet Take 10 mg by mouth daily  with breakfast.    [provider]  Prenatal Vit-Fe Fumarate-FA (M-NATAL PLUS) 27-1 MG TABS Take 1 tablet by mouth 2 (two) times daily. 05/09/20   [provider]  sucralfate (CARAFATE) 1 g tablet Take 1 g by mouth 3 (three) times daily. Dissolve 1 tablet in 2 ounces of water 05/27/20   [provider]    Allergies    Patient has no known allergies.  Review of Systems   Review of Systems  Neurological:  Positive for syncope.  All other systems reviewed and are negative.  Physical Exam Updated Vital Signs BP 98/70   Pulse 69   Temp 97.7 F (36.5 C) (Oral)   Resp 16   Ht  (1.473 m)   Wt 41.2 kg   SpO2 99%   BMI 18.98 kg/m   Physical Exam Vitals and nursing note reviewed.  Constitutional:      Comments: Chronically ill and appears dehydrated  HENT:     Head: Normocephalic.     Nose: Nose normal.     Mouth/Throat:     Mouth: Mucous membranes are dry.  Eyes:     Extraocular Movements: Extraocular movements intact.     Pupils: Pupils are equal, round, and reactive to light.  Cardiovascular:     Rate and Rhythm: Normal rate and regular rhythm.     Pulses: Normal pulses.     Heart sounds: Normal heart sounds.  Pulmonary:     Effort: Pulmonary effort is normal.     Breath sounds: Normal breath sounds.  Abdominal:     General: Abdomen is flat.     Palpations: Abdomen is soft.     Comments: No obvious pulsatile mass and not tender  Musculoskeletal:        General: Normal range of motion.     Cervical back: Normal range of motion and neck supple.     Comments: 1+ edema bilaterally  Skin:    General: Skin is warm.     Capillary Refill: Capillary refill takes less than 2 seconds.  Neurological:     General: No focal deficit present.     Mental Status: She is oriented to person, place, and time.  Psychiatric:        Mood and Affect: Mood normal.        Behavior: Behavior normal.    ED Results / Procedures / Treatments   Labs (all labs  ordered are listed, but only abnormal results are displayed) Labs Reviewed  CBC WITH DIFFERENTIAL/PLATELET - Abnormal; Notable for the following components:      Result Value   RBC 2.66 (*)    Hemoglobin 9.2 (*)    HCT 28.1 (*)    MCV 105.6 (*)    MCH 34.6 (*)    RDW 15.6 (*)    All other components within normal limits  COMPREHENSIVE METABOLIC PANEL - Abnormal; Notable for the following components:   Potassium 3.1 (*)    Chloride 113 (*)  CO2 20 (*)    BUN 7 (*)    Calcium 8.2 (*)    Total Protein 4.6 (*)    Albumin 2.3 (*)    All other components within normal limits  LACTIC ACID, PLASMA - Abnormal; Notable for the following components:   Lactic Acid, Venous 3.0 (*)    All other components within normal limits  LACTIC ACID, PLASMA - Abnormal; Notable for the following components:   Lactic Acid, Venous 2.3 (*)    All other components within normal limits  URINALYSIS, ROUTINE W REFLEX MICROSCOPIC - Abnormal; Notable for the following components:   Color, Urine AMBER (*)    APPearance HAZY (*)    Hgb urine dipstick MODERATE (*)    Bilirubin Urine SMALL (*)    Leukocytes,Ua LARGE (*)    Bacteria, UA MANY (*)    All other components within normal limits  I-STAT CHEM 8, ED - Abnormal; Notable for the following components:   Chloride 113 (*)    BUN 5 (*)    Calcium, Ion 1.04 (*)    TCO2 19 (*)    Hemoglobin 9.2 (*)    HCT 27.0 (*)    All other components within normal limits  RESP PANEL BY RT-PCR (FLU A&B, COVID) ARPGX2  CULTURE, BLOOD (ROUTINE X 2)  CULTURE, BLOOD (ROUTINE X 2)  URINE CULTURE  C DIFFICILE QUICK SCREEN W PCR REFLEX    GASTROINTESTINAL PANEL BY PCR, STOOL (REPLACES STOOL CULTURE)  TROPONIN I (HIGH SENSITIVITY)  TROPONIN I (HIGH SENSITIVITY)    EKG EKG Interpretation  Date/Time:  Thursday September 19 2020 18:34:56 EDT Ventricular Rate:  67 PR Interval:  139 QRS Duration: 123 QT Interval:  466 QTC Calculation: 492 R Axis:   -17 Text  Interpretation: Sinus rhythm IVCD, consider atypical RBBB Nonspecific T abnormalities, lateral leads TWI unchanged from previous Confirmed by Richardean CanalYao, Gerry Blanchfield H [phone]([id number]) on 09/19/2020 6:39:23 PM  Radiology DG Chest Port 1 View  Result Date: 09/19/2020 CLINICAL DATA:  Syncope EXAM: PORTABLE CHEST 1 VIEW COMPARISON:  None. FINDINGS: Widened mediastinum. No focal airspace disease. No pleural effusion or visible pneumothorax. No acute osseous abnormality. IMPRESSION: Widened mediastinum, recommend CTA of the chest to rule out acute aortic pathology. No focal airspace disease. Electronically Signed   By: Caprice RenshawJacob  Kahn   On: 09/19/2020 19:13   CT Angio Chest/Abd/Pel for Dissection W and/or Wo Contrast  Result Date: 09/19/2020 CLINICAL DATA:  Abdominal pain. Aortic dissection suspected. Syncopal episode. Hypotension. Diffuse abdominal pain. EXAM: CT ANGIOGRAPHY CHEST, ABDOMEN AND PELVIS TECHNIQUE: Multidetector CT imaging through the chest, abdomen and pelvis was performed using the standard protocol during bolus administration of intravenous contrast. Multiplanar reconstructed images and MIPs were obtained and reviewed to evaluate the vascular anatomy. CONTRAST:  100mL OMNIPAQUE IOHEXOL 350 MG/ML SOLN COMPARISON:  Chest radiography same day.  Abdominal MRI 09/11/2020. FINDINGS: CTA CHEST FINDINGS Cardiovascular: Heart size is mildly enlarged. There is coronary artery calcification. There is aortic atherosclerotic calcification. No aneurysm or dissection. The aorta is tortuous. Brachiocephalic vessel origins are patent. Pulmonary arterial tree as well opacified and no emboli are seen. Mediastinum/Nodes: No mediastinal or hilar mass or lymphadenopathy. Lungs/Pleura: No emphysema. Mild pulmonary scarring at the apices and lung bases. No pleural effusion. No consolidation or collapse. No mass. Musculoskeletal: Thoracic kyphotic curvature. No thoracic region fracture. Review of the MIP images confirms the above findings. CTA  ABDOMEN AND PELVIS FINDINGS VASCULAR Aorta: Aortic atherosclerosis.  No aneurysm.  No dissection. Celiac: No significant stenosis.  SMA: No significant stenosis. Renals: Bilateral origin calcification with potential moderate stenosis on both sides. IMA: Patent Inflow: Normal Veins: No venous abnormality seen. Review of the MIP images confirms the above findings. NON-VASCULAR Hepatobiliary: No focal liver parenchymal finding. Mild chronic fullness of the ductal system as previously evaluated. No obstructing lesion seen. Pancreas: Mild fullness of the pancreatic duct as previously seen. No acute pancreatitis. Spleen: Normal Adrenals/Urinary Tract: Adrenal glands are normal. Kidneys show some atrophic change. No focal lesion. Bladder is unremarkable. Stomach/Bowel: No acute or significant bowel finding. No evidence of obstruction. No evidence of enteritis. No evidence of diverticulitis. Lymphatic: No adenopathy. Reproductive: Previous hysterectomy.  No pelvic mass. Other: No free fluid or air. Musculoskeletal: Scoliosis and degenerative change of the spine. Question healed or healing sacral insufficiency fractures. Review of the MIP images confirms the above findings. IMPRESSION: No aortic dissection. Mild cardiomegaly. Coronary artery calcification. Aortic atherosclerotic calcification. Calcification at both renal artery origins. There could be flow-limiting renal artery stenosis. No acute abdominal organ pathology. Chronic prominence of the biliary tree and pancreatic duct, recently evaluated by MRI, without significant cause. Electronically Signed   By: Paulina Fusi M.D.   On: 09/19/2020 20:20    Procedures Procedures   CRITICAL CARE Performed by: Richardean Canal   Total critical care time: 30 minutes  Critical care time was exclusive of separately billable procedures and treating other patients.  Critical care was necessary to treat or prevent imminent or life-threatening deterioration.  Critical care was  time spent personally by me on the following activities: development of treatment plan with patient and/or surrogate as well as nursing, discussions with consultants, evaluation of patient's response to treatment, examination of patient, obtaining history from patient or surrogate, ordering and performing treatments and interventions, ordering and review of laboratory studies, ordering and review of radiographic studies, pulse oximetry and re-evaluation of patient's condition.   Medications Ordered in ED Medications  sodium chloride 0.9 % bolus 1,000 mL (0 mLs Intravenous Stopped 09/19/20 2017)  iohexol (OMNIPAQUE) 350 MG/ML injection 100 mL (100 mLs Intravenous Contrast Given 09/19/20 2000)  ceFEPIme (MAXIPIME) 2 g in sodium chloride 0.9 % 100 mL IVPB (0 g Intravenous Stopped 09/19/20 2110)  sodium chloride 0.9 % bolus 1,000 mL (1,000 mLs Intravenous New Bag/Given 09/19/20 2109)    ED Course  I have reviewed the triage vital signs and the nursing notes.  Pertinent labs & imaging results that were available during my care of the patient were reviewed by me and considered in my medical decision making (see chart for details).    MDM Rules/Calculators/A&P                          Kyrielle Urbanski is a 75 y.o. female here presenting with near syncope and hypotension.  Patient likely passed out from hypotension.  Etiology of hypotension is unclear.  Patient is on diuretics and has poor p.o. intake at baseline so dehydration is most likely the cause.  She has a recent CT abdomen pelvis that did not show AAA and she has no abdominal pain currently.  Patient also has diarrhea as can contribute to her hypotension.  Plan to get CBC and CMP and troponin and lipase and GI pathogen panel.  Will hydrate and likely admission   9:42 PM Patient's initial chest x-ray showed widened mediastinum.  Dissection study performed and patient has no dissection.  Patient's lactate is elevated at 3.  Her likely source is  UTI.  Patient was given cefepime for broad-spectrum coverage.  After 2 L normal saline bolus, her blood pressure went up to 98/70.  At this point will admit for sepsis from UTI.  Final Clinical Impression(s) / ED Diagnoses Final diagnoses:  None    Rx / DC Orders ED Discharge Orders     None        Charlynne Pander, MD 09/19/20 2142

## 2020-09-19 NOTE — H&P (Addendum)
History and Physical    Wanda Valdez LKG:[id number] DOB: 03-11-1945 DOA: 09/19/2020  PCP: Doreen Salvage, PA-C  Patient coming from: Home  I have personally briefly reviewed patient's old medical records in [facility]  Chief Complaint: Syncope  HPI: Wanda Valdez is a 75 y.o. female with medical history significant of HTN, PUD, ongoing smoking, chronic steroid use (not clear why).  Pt admitted to our service 7/12-7/15 for a 2 week h/o dysphagia, diarrhea, decreased PO intake, and 20lb wt loss since Feb.  Work up that admission including EGD and colonoscopy demonstrated mild colitis.  Bx were taken.  Pt discharged.  Biopsies have since come back showing mild / early lymphocytic colitis.  Today patient presents to ED following syncopal epsisode at home.  Pt apparently was feeling weak today, sitting outside reportedly, had syncope.  Daughter did some chest compressions while EMS was summoned, though she had pulses on EMS arrival.  Pt noted to be hypotensive with SBP in the 70s on EMS arrival.  Pt denies abd pain, CP, SOB.  Does have ongoing diarrhea.   ED Course: Lactate 3.0 improved to 2.3 on repeat after IVF bolus.  BP also improved after IVF bolus.  UA contaminated by stool, though EDP didn't know this and gave dose of cefepime.  Urine culture pending at this time.  2L NS bolus in ED with improvement in BP.   Review of Systems: As per HPI, otherwise all review of systems negative.  Past Medical History:  Diagnosis Date   Hypertension     Past Surgical History:  Procedure Laterality Date   ABDOMINAL HYSTERECTOMY     BIOPSY  09/11/2020   Procedure: BIOPSY;  Surgeon: Kathi Der, MD;  Location: [facility];  Service: Gastroenterology;;   BIOPSY  09/13/2020   Procedure: BIOPSY;  Surgeon: Kathi Der, MD;  Location: [facility];  Service: Gastroenterology;;   COLONOSCOPY N/A 09/13/2020   Procedure: COLONOSCOPY;  Surgeon: Kathi Der,  MD;  Location: [facility];  Service: Gastroenterology;  Laterality: N/A;   ESOPHAGOGASTRODUODENOSCOPY N/A 09/11/2020   Procedure: ESOPHAGOGASTRODUODENOSCOPY (EGD);  Surgeon: Kathi Der, MD;  Location: [facility];  Service: Gastroenterology;  Laterality: N/A;   POLYPECTOMY  09/13/2020   Procedure: POLYPECTOMY;  Surgeon: Kathi Der, MD;  Location: [facility];  Service: Gastroenterology;;     reports that she has been smoking cigarettes. She has never used smokeless tobacco. She reports current alcohol use. She reports that she does not use drugs.  No Known Allergies  Family History  Problem Relation Age of Onset   Cirrhosis Mother    Heart attack Father      Prior to Admission medications   Medication Sig Start Date End Date Taking? Authorizing Provider  alendronate (FOSAMAX) 35 MG tablet Take 35 mg by mouth once a week. 03/28/20  Yes [provider]  allopurinol (ZYLOPRIM) 100 MG tablet Take 100 mg by mouth daily. 08/10/20  Yes [provider]  carvedilol (COREG) 6.25 MG tablet Take 6.25 mg by mouth 2 (two) times daily. 06/17/20  Yes [provider]  CREON 6000-19000 units CPEP Take 2 capsules by mouth 3 (three) times daily. 05/11/20  Yes [provider]  furosemide (LASIX) 20 MG tablet Take 20 mg by mouth daily.   Yes [provider]  Multiple Vitamin (MULTIVITAMIN WITH MINERALS) TABS tablet Take 1 tablet by mouth daily. 09/14/20  Yes Pahwani, Rinka R, MD  pantoprazole (PROTONIX) 40 MG tablet Take 1 tablet (40 mg total) by mouth daily. 09/14/20  Yes Pahwani, Rinka R, MD  potassium chloride SA (KLOR-CON) 20 MEQ tablet Take 20 mEq by mouth daily. 07/19/20  Yes [provider]  predniSONE (DELTASONE) 10 MG tablet Take 10 mg by mouth daily with breakfast.   Yes [provider]  sucralfate (CARAFATE) 1 g tablet Take 1 g by mouth 3 (three) times daily. Dissolve 1 tablet in 2 ounces of water 05/27/20  Yes [provider]    Physical Exam: Vitals:   09/19/20 2015 09/19/20 2030 09/19/20 2100 09/19/20 2126  BP: 92/73 95/68 92/71  98/70  Pulse: 75 75 68 69  Resp: (!) 24 17 (!) 21 16  Temp:      TempSrc:      SpO2: 99% 99% 100% 99%  Weight:      Height:        Constitutional: NAD, calm, comfortable, cachectic, malnourished Eyes: PERRL, lids and conjunctivae normal ENMT: Mucous membranes are moist. Posterior pharynx clear of any exudate or lesions.Normal dentition.  Neck: normal, supple, no masses, no thyromegaly Respiratory: clear to auscultation bilaterally, no wheezing, no crackles. Normal respiratory effort. No accessory muscle use.  Cardiovascular: Regular rate and rhythm, no murmurs / rubs / gallops. No extremity edema. 2+ pedal pulses. No carotid bruits.  Abdomen: no tenderness, no masses palpated. No hepatosplenomegaly. Bowel sounds positive.  Musculoskeletal: no clubbing / cyanosis. No joint deformity upper and lower extremities. Good ROM, no contractures. Normal muscle tone.  Skin: no rashes, lesions, ulcers. No induration Neurologic: CN 2-12 grossly intact. Sensation intact, DTR normal. Strength 5/5 in all 4.  Psychiatric: Normal judgment and insight. Alert and oriented x 3. Normal mood.    Labs on Admission: I have personally reviewed following labs and imaging studies  CBC: Recent Labs  Lab 09/13/20 0428 09/19/20 1814 09/19/20 1844  WBC 3.8* 4.9  --   NEUTROABS  --  2.3  --   HGB 9.8* 9.2* 9.2*  HCT 30.1* 28.1* 27.0*  MCV 103.4* 105.6*  --   PLT 158 174  --    Basic Metabolic Panel: Recent Labs  Lab 09/13/20 0428 09/19/20 1814 09/19/20 1844  NA 139 142 142  K 3.7 3.1* 3.7  CL 115* 113* 113*  CO2 19* 20*  --   GLUCOSE 91 94 83  BUN 10 7* 5*  CREATININE 0.97 0.89 0.90  CALCIUM 8.9 8.2*  --   MG 1.7  --   --    GFR: Estimated Creatinine Clearance: 34.3 mL/min (by C-G formula based on SCr of 0.9 mg/dL). Liver Function Tests: Recent Labs  Lab  09/19/20 1814  AST 31  ALT 29  ALKPHOS 79  BILITOT 0.3  PROT 4.6*  ALBUMIN 2.3*   No results for input(s): LIPASE, AMYLASE in the last 168 hours. No results for input(s): AMMONIA in the last 168 hours. Coagulation Profile: No results for input(s): INR, PROTIME in the last 168 hours. Cardiac Enzymes: No results for input(s): CKTOTAL, CKMB, CKMBINDEX, TROPONINI in the last 168 hours. BNP (last 3 results) No results for input(s): PROBNP in the last 8760 hours. HbA1C: No results for input(s): HGBA1C in the last 72 hours. CBG: Recent Labs  Lab 09/13/20 0827 09/13/20 1131  GLUCAP 79 81   Lipid Profile: No results for input(s): CHOL, HDL, LDLCALC, TRIG, CHOLHDL, LDLDIRECT in the last 72 hours. Thyroid Function Tests: No results for input(s): TSH, T4TOTAL, FREET4, T3FREE, THYROIDAB in the last 72 hours. Anemia Panel: No results for input(s): VITAMINB12, FOLATE, FERRITIN, TIBC, IRON, RETICCTPCT in  the last 72 hours. Urine analysis:    Component Value Date/Time   COLORURINE AMBER (A) 09/19/2020 1930   APPEARANCEUR HAZY (A) 09/19/2020 1930   LABSPEC 1.011 09/19/2020 1930   PHURINE 7.0 09/19/2020 1930   GLUCOSEU NEGATIVE 09/19/2020 1930   HGBUR MODERATE (A) 09/19/2020 1930   BILIRUBINUR SMALL (A) 09/19/2020 1930   KETONESUR NEGATIVE 09/19/2020 1930   PROTEINUR NEGATIVE 09/19/2020 1930   NITRITE NEGATIVE 09/19/2020 1930   LEUKOCYTESUR LARGE (A) 09/19/2020 1930    Radiological Exams on Admission: DG Chest Port 1 View  Result Date: 09/19/2020 CLINICAL DATA:  Syncope EXAM: PORTABLE CHEST 1 VIEW COMPARISON:  None. FINDINGS: Widened mediastinum. No focal airspace disease. No pleural effusion or visible pneumothorax. No acute osseous abnormality. IMPRESSION: Widened mediastinum, recommend CTA of the chest to rule out acute aortic pathology. No focal airspace disease. Electronically Signed   By: Caprice Renshaw   On: 09/19/2020 19:13   CT Angio Chest/Abd/Pel for Dissection W and/or Wo  Contrast  Result Date: 09/19/2020 CLINICAL DATA:  Abdominal pain. Aortic dissection suspected. Syncopal episode. Hypotension. Diffuse abdominal pain. EXAM: CT ANGIOGRAPHY CHEST, ABDOMEN AND PELVIS TECHNIQUE: Multidetector CT imaging through the chest, abdomen and pelvis was performed using the standard protocol during bolus administration of intravenous contrast. Multiplanar reconstructed images and MIPs were obtained and reviewed to evaluate the vascular anatomy. CONTRAST:  OMNIPAQUE IOHEXOL 350 MG/ML SOLN COMPARISON:  Chest radiography same day.  Abdominal MRI 09/11/2020. FINDINGS: CTA CHEST FINDINGS Cardiovascular: Heart size is mildly enlarged. There is coronary artery calcification. There is aortic atherosclerotic calcification. No aneurysm or dissection. The aorta is tortuous. Brachiocephalic vessel origins are patent. Pulmonary arterial tree as well opacified and no emboli are seen. Mediastinum/Nodes: No mediastinal or hilar mass or lymphadenopathy. Lungs/Pleura: No emphysema. Mild pulmonary scarring at the apices and lung bases. No pleural effusion. No consolidation or collapse. No mass. Musculoskeletal: Thoracic kyphotic curvature. No thoracic region fracture. Review of the MIP images confirms the above findings. CTA ABDOMEN AND PELVIS FINDINGS VASCULAR Aorta: Aortic atherosclerosis.  No aneurysm.  No dissection. Celiac: No significant stenosis. SMA: No significant stenosis. Renals: Bilateral origin calcification with potential moderate stenosis on both sides. IMA: Patent Inflow: Normal Veins: No venous abnormality seen. Review of the MIP images confirms the above findings. NON-VASCULAR Hepatobiliary: No focal liver parenchymal finding. Mild chronic fullness of the ductal system as previously evaluated. No obstructing lesion seen. Pancreas: Mild fullness of the pancreatic duct as previously seen. No acute pancreatitis. Spleen: Normal Adrenals/Urinary Tract: Adrenal glands are normal. Kidneys show  some atrophic change. No focal lesion. Bladder is unremarkable. Stomach/Bowel: No acute or significant bowel finding. No evidence of obstruction. No evidence of enteritis. No evidence of diverticulitis. Lymphatic: No adenopathy. Reproductive: Previous hysterectomy.  No pelvic mass. Other: No free fluid or air. Musculoskeletal: Scoliosis and degenerative change of the spine. Question healed or healing sacral insufficiency fractures. Review of the MIP images confirms the above findings. IMPRESSION: No aortic dissection. Mild cardiomegaly. Coronary artery calcification. Aortic atherosclerotic calcification. Calcification at both renal artery origins. There could be flow-limiting renal artery stenosis. No acute abdominal organ pathology. Chronic prominence of the biliary tree and pancreatic duct, recently evaluated by MRI, without significant cause. Electronically Signed   By: Paulina Fusi M.D.   On: 09/19/2020 20:20    EKG: Independently reviewed.  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 Chronic steroid use - Interestingly Pt had been on prednisone since BEFORE hospital admission last week and diagnosis of lymphocytic colitis. Not sure why she was originally  started on prednisone, how long ago she was started on prednisone, nor who started her on prednisone. Plan to continue prednisone 10mg  PO daily for the moment Dont think pt in adrenal crisis at the moment Asking GI if dose should be increased given clear incomplete remission of lymphocytic colitis. H/o HTN - Holding all home BP meds given hypotension today. Anemia - Due to colitis? Stable since last admit Repeat CBC in AM. SCDs for DVT ppx  DVT prophylaxis: SCDs Code Status: Full Family Communication: Spoke with daughter on phone Disposition Plan: Home after syncope work up, rehydration, and treatment of lymphocytic colitis Consults called: Message Sent to Dr. for AM consult Admission status: Place in 57    Latron Ribas M. DO [facility]  How to contact the [facility] Attending or Consulting provider 7A - 7P or covering provider during after hours 7P -7A, for this patient?  Check the care team in [facility] and look for a) attending/consulting [facility] provider listed and b) the [facility] team listed Log into [email or web address]  [employer] Physician Scheduling and messaging for groups and whole hospitals  On call and physician scheduling software for group practices, residents, hospitalists and other medical providers for call, clinic, rotation and shift schedules. On[employer] is a hospital-wide system for scheduling doctors and paging doctors on call. EasyPlot is for scientific plotting and data analysis.  [email or web address]  and use [place]'s universal password to access. If you do not have the password, please contact the hospital operator.  Locate the [facility] provider you are looking for under [facility] and page to a number that you can be directly reached. If you still have difficulty reaching the provider, please page the [facility] (Director on Call) for the Hospitalists listed on amion for assistance.  09/19/2020, 10:12 PM

## 2020-09-20 ENCOUNTER — Observation Stay (HOSPITAL_COMMUNITY): Payer: Medicare Other

## 2020-09-20 DIAGNOSIS — Z7952 Long term (current) use of systemic steroids: Secondary | ICD-10-CM | POA: Diagnosis not present

## 2020-09-20 DIAGNOSIS — Z90710 Acquired absence of both cervix and uterus: Secondary | ICD-10-CM | POA: Diagnosis not present

## 2020-09-20 DIAGNOSIS — R1310 Dysphagia, unspecified: Secondary | ICD-10-CM | POA: Diagnosis present

## 2020-09-20 DIAGNOSIS — Z79899 Other long term (current) drug therapy: Secondary | ICD-10-CM | POA: Diagnosis not present

## 2020-09-20 DIAGNOSIS — Z86010 Personal history of colonic polyps: Secondary | ICD-10-CM | POA: Diagnosis not present

## 2020-09-20 DIAGNOSIS — K52832 Lymphocytic colitis: Secondary | ICD-10-CM | POA: Diagnosis present

## 2020-09-20 DIAGNOSIS — E43 Unspecified severe protein-calorie malnutrition: Secondary | ICD-10-CM | POA: Diagnosis present

## 2020-09-20 DIAGNOSIS — E876 Hypokalemia: Secondary | ICD-10-CM | POA: Diagnosis present

## 2020-09-20 DIAGNOSIS — D6489 Other specified anemias: Secondary | ICD-10-CM | POA: Diagnosis present

## 2020-09-20 DIAGNOSIS — Z8249 Family history of ischemic heart disease and other diseases of the circulatory system: Secondary | ICD-10-CM | POA: Diagnosis not present

## 2020-09-20 DIAGNOSIS — Z20822 Contact with and (suspected) exposure to covid-19: Secondary | ICD-10-CM | POA: Diagnosis present

## 2020-09-20 DIAGNOSIS — R55 Syncope and collapse: Secondary | ICD-10-CM

## 2020-09-20 DIAGNOSIS — I10 Essential (primary) hypertension: Secondary | ICD-10-CM | POA: Diagnosis present

## 2020-09-20 DIAGNOSIS — F17210 Nicotine dependence, cigarettes, uncomplicated: Secondary | ICD-10-CM | POA: Diagnosis present

## 2020-09-20 DIAGNOSIS — Z9049 Acquired absence of other specified parts of digestive tract: Secondary | ICD-10-CM | POA: Diagnosis not present

## 2020-09-20 DIAGNOSIS — Z681 Body mass index (BMI) 19 or less, adult: Secondary | ICD-10-CM | POA: Diagnosis not present

## 2020-09-20 DIAGNOSIS — K529 Noninfective gastroenteritis and colitis, unspecified: Secondary | ICD-10-CM | POA: Diagnosis present

## 2020-09-20 DIAGNOSIS — K2950 Unspecified chronic gastritis without bleeding: Secondary | ICD-10-CM | POA: Diagnosis present

## 2020-09-20 DIAGNOSIS — E860 Dehydration: Secondary | ICD-10-CM | POA: Diagnosis present

## 2020-09-20 DIAGNOSIS — K648 Other hemorrhoids: Secondary | ICD-10-CM | POA: Diagnosis present

## 2020-09-20 DIAGNOSIS — Z8711 Personal history of peptic ulcer disease: Secondary | ICD-10-CM | POA: Diagnosis not present

## 2020-09-20 LAB — GASTROINTESTINAL PANEL BY PCR, STOOL (REPLACES STOOL CULTURE)

## 2020-09-20 LAB — URINALYSIS, ROUTINE W REFLEX MICROSCOPIC
Bilirubin Urine: NEGATIVE
Glucose, UA: NEGATIVE mg/dL
Hgb urine dipstick: NEGATIVE
Ketones, ur: NEGATIVE mg/dL
Leukocytes,Ua: NEGATIVE
Nitrite: NEGATIVE
Protein, ur: NEGATIVE mg/dL
Specific Gravity, Urine: 1.018 (ref 1.005–1.030)
pH: 5 (ref 5.0–8.0)

## 2020-09-20 LAB — ECHOCARDIOGRAM COMPLETE
Area-P 1/2: 2.8 cm2
Height: 58 in
S' Lateral: 1.5 cm
Weight: 1453.27 oz

## 2020-09-20 LAB — VITAMIN B1: Vitamin B1 (Thiamine): 92.7 nmol/L (ref 66.5–200.0)

## 2020-09-20 LAB — CBG MONITORING, ED: Glucose-Capillary: 70 mg/dL (ref 70–99)

## 2020-09-20 MED ORDER — CHOLESTYRAMINE LIGHT 4 G PO PACK
4.0000 g | PACK | Freq: Two times a day (BID) | ORAL | Status: DC
  Administered 2020-09-20 – 2020-09-22 (×4): 4 g via ORAL
  Filled 2020-09-20 (×6): qty 1

## 2020-09-20 NOTE — ED Notes (Signed)
Patient provided crackers and Orange Juice.

## 2020-09-20 NOTE — Progress Notes (Signed)
Echocardiogram 2D Echocardiogram has been performed.  Warren Lacy Sharnita Bogucki RDCS 09/20/2020, 12:12 PM

## 2020-09-20 NOTE — Consult Note (Addendum)
bReferring Provider: ED Primary Care Physician:  Doreen SalvageBulla, Donald, PA-C Primary Gastroenterologist: Gentry FitzUnassigned  Reason for Consultation:  Diarrhea, syncope  HPI: Wanda Valdez is a 75 y.o. female past medical history of HTN, gastric ulcer, hysterectomy, and cholecystectomy presenting for consultation diarrhea and syncope.  Patient states she was sitting outside yesterday and had a syncopal event.  She states she is not sure if the heat is what caused her to pass out (90-100F).  She states that diarrhea did not start until after her syncopal event.  She states she has actually been having formed bowel movements over the last several days.  However, after she presented to the ED yesterday, she had approximately 6 loose stools.  Denies melena or hematochezia.  She has also lost approximately 20 lbs in the past couple of months month.     Denies family history of colon cancer or gastrointestinal malignancy.  States she had a colon[facility] approximately 6 years ago [facility].   Denies aspirin, NSAID, or blood thinner use. Patient is on 10 mg prednisone (unclear reason, she cannot recall why she is on prednisone).  She underwent EGD 09/11/20 for dysphagia: no findings to explain dysphagia. Gastritis (bx: mild chronic gastritis, no H pylori), duodenitis.  Colonoscopy 09/13/20 for diarrhea and colitis on CT imaging: Congested and erythematous mucosa in the transverse colon and in the ascending colon (Hyperemia with mildly increased intraepithelial lymphocytes and  increased eosinophils). One 3 mm polyp in the transverse colon (bx: tubular adenoma), One 2 mm polyp in the sigmoid colon (bx: Colonic mucosa with mildly increased intraepithelial lymphocytes and  increased eosinophils).  Internal hemorrhoids.  Past Medical History:  Diagnosis Date   Hypertension     Past Surgical History:  Procedure Laterality Date   ABDOMINAL HYSTERECTOMY     BIOPSY  09/11/2020   Procedure: BIOPSY;  Surgeon:  Kathi DerBrahmbhatt, Parag, MD;  Location: [facility];  Service: Gastroenterology;;   BIOPSY  09/13/2020   Procedure: BIOPSY;  Surgeon: Kathi DerBrahmbhatt, Parag, MD;  Location: [facility];  Service: Gastroenterology;;   COLONOSCOPY N/A 09/13/2020   Procedure: COLONOSCOPY;  Surgeon: Kathi DerBrahmbhatt, Parag, MD;  Location: [facility];  Service: Gastroenterology;  Laterality: N/A;   ESOPHAGOGASTRODUODENOSCOPY N/A 09/11/2020   Procedure: ESOPHAGOGASTRODUODENOSCOPY (EGD);  Surgeon: Kathi DerBrahmbhatt, Parag, MD;  [facility];  Service: Gastroenterology;  Laterality: N/A;   POLYPECTOMY  09/13/2020   Procedure: POLYPECTOMY;  Surgeon: Kathi DerBrahmbhatt, Parag, MD;  Location: [facility];  Service: Gastroenterology;;    Prior to Admission medications   Medication Sig Start Date End Date Taking? Authorizing Provider  alendronate (FOSAMAX) 35 MG tablet Take 35 mg by mouth once a week. 03/28/20  Yes [provider]  allopurinol (ZYLOPRIM) 100 MG tablet Take 100 mg by mouth daily. 08/10/20  Yes [provider]  carvedilol (COREG) 6.25 MG tablet Take 6.25 mg by mouth 2 (two) times daily. 06/17/20  Yes [provider]  CREON 6000-19000 units CPEP Take 2 capsules by mouth 3 (three) times daily. 05/11/20  Yes [provider]  furosemide (LASIX) 20 MG tablet Take 20 mg by mouth daily.   Yes [provider]  Multiple Vitamin (MULTIVITAMIN WITH MINERALS) TABS tablet Take 1 tablet by mouth daily. 09/14/20  Yes Pahwani, Rinka R, MD  pantoprazole (PROTONIX) 40 MG tablet Take 1 tablet (40 mg total) by mouth daily. 09/14/20  Yes Pahwani, Rinka R, MD  potassium chloride SA (KLOR-CON) 20 MEQ tablet Take 20 mEq by mouth daily. 07/19/20  Yes [provider]  predniSONE (DELTASONE)  10 MG tablet Take 10 mg by mouth daily with breakfast.   Yes [provider]  sucralfate (CARAFATE) 1 g tablet Take 1 g by mouth 3 (three) times daily. Dissolve 1 tablet in 2 ounces of water 05/27/20  Yes [provider]    Scheduled Meds:  allopurinol  100 mg Oral Daily   lipase/protease/amylase  12,000 Units Oral TID   multivitamin with minerals  1 tablet Oral Daily   pantoprazole  40 mg Oral Daily   potassium chloride SA  20 mEq Oral Daily   predniSONE  10 mg Oral Q breakfast   sodium chloride flush  3 mL Intravenous Q12H   sucralfate  1 g Oral TID   Continuous Infusions:  lactated ringers 75 mL/hr at 09/20/20 0636   PRN Meds:.acetaminophen **OR** acetaminophen, loperamide, ondansetron **OR** ondansetron (ZOFRAN) IV  Allergies as of 09/19/2020   (No Known Allergies)    Family History  Problem Relation Age of Onset   Cirrhosis Mother    Heart attack Father     Social History   Socioeconomic History   Marital status: Divorced    Spouse name: Not on file   Number of children: Not on file   Years of education: Not on file   Highest education level: Not on file  Occupational History   Not on file  Tobacco Use   Smoking status: Some Days    Types: Cigarettes   Smokeless tobacco: Never  Vaping Use   Vaping Use: Never used  Substance and Sexual Activity   Alcohol use: Yes    Comment: occassional   Drug use: Never   Sexual activity: Not on file  Other Topics Concern   Not on file  Social History Narrative   Not on file   Social Determinants of Health   Financial Resource Strain: Not on file  Food Insecurity: Not on file  Transportation Needs: Not on file  Physical Activity: Not on file  Stress: Not on file  Social Connections: Not on file  Intimate Partner Violence: Not on file    Review of Systems: Review of Systems  Constitutional:  Positive for weight loss. Negative for chills and fever.  HENT:  Negative for sore throat.   Eyes:  Negative for pain and redness.  Respiratory:  Negative for cough, shortness of breath and stridor.   Cardiovascular:  Negative for chest pain and palpitations.  Gastrointestinal:  Positive for diarrhea. Negative for abdominal  pain, blood in stool, constipation, heartburn, melena, nausea and vomiting.  Genitourinary:  Negative for flank pain and hematuria.  Musculoskeletal:  Negative for myalgias and neck pain.  Skin:  Negative for itching and rash.  Neurological:  Positive for loss of consciousness. Negative for seizures.  Psychiatric/Behavioral:  Negative for substance abuse. The patient is not nervous/anxious.     Physical Exam: Vital signs: Vitals:   09/20/20 0630 09/20/20 0930  BP: (!) 117/92 139/86  Pulse: 62 64  Resp: 17 14  Temp:    SpO2: 100% 100%      Physical Exam Vitals reviewed.  Constitutional:      General: She is not in acute distress.    Appearance: She is underweight.  HENT:     Head: Normocephalic and atraumatic.     Nose: Nose normal. No congestion.     Mouth/Throat:     Mouth: Mucous membranes are moist.     Pharynx: Oropharynx is clear.  Eyes:     Extraocular Movements: Extraocular movements intact.  Conjunctiva/sclera: Conjunctivae normal.  Cardiovascular:     Rate and Rhythm: Normal rate and regular rhythm.  Pulmonary:     Effort: Pulmonary effort is normal. No respiratory distress.  Abdominal:     General: Abdomen is flat. Bowel sounds are normal. There is no distension.     Palpations: Abdomen is soft. There is no mass.     Tenderness: There is no abdominal tenderness. There is no guarding or rebound.     Hernia: No hernia is present.  Musculoskeletal:        General: No swelling or tenderness.     Cervical back: Normal range of motion and neck supple.  Skin:    General: Skin is warm and dry.  Neurological:     General: No focal deficit present.     Mental Status: She is alert and oriented to person, place, and time.  Psychiatric:        Mood and Affect: Mood normal.        Behavior: Behavior normal. Behavior is cooperative.    GI:  Lab Results: Recent Labs    09/19/20 1814 09/19/20 1844  WBC 4.9  --   HGB 9.2* 9.2*  HCT 28.1* 27.0*  PLT 174  --     BMET Recent Labs    09/19/20 1814 09/19/20 1844  NA 142 142  K 3.1* 3.7  CL 113* 113*  CO2 20*  --   GLUCOSE 94 83  BUN 7* 5*  CREATININE 0.89 0.90  CALCIUM 8.2*  --    LFT Recent Labs    09/19/20 1814  PROT 4.6*  ALBUMIN 2.3*  AST 31  ALT 29  ALKPHOS 79  BILITOT 0.3   PT/INR No results for input(s): LABPROT, INR in the last 72 hours.   Studies/Results: DG Chest Port 1 View  Result Date: 09/19/2020 CLINICAL DATA:  Syncope EXAM: PORTABLE CHEST 1 VIEW COMPARISON:  None. FINDINGS: Widened mediastinum. No focal airspace disease. No pleural effusion or visible pneumothorax. No acute osseous abnormality. IMPRESSION: Widened mediastinum, recommend CTA of the chest to rule out acute aortic pathology. No focal airspace disease. Electronically Signed   By: Caprice Renshaw   On: 09/19/2020 19:13   CT Angio Chest/Abd/Pel for Dissection W and/or Wo Contrast  Result Date: 09/19/2020 CLINICAL DATA:  Abdominal pain. Aortic dissection suspected. Syncopal episode. Hypotension. Diffuse abdominal pain. EXAM: CT ANGIOGRAPHY CHEST, ABDOMEN AND PELVIS TECHNIQUE: Multidetector CT imaging through the chest, abdomen and pelvis was performed using the standard protocol during bolus administration of intravenous contrast. Multiplanar reconstructed images and MIPs were obtained and reviewed to evaluate the vascular anatomy. CONTRAST:  OMNIPAQUE IOHEXOL 350 MG/ML SOLN COMPARISON:  Chest radiography same day.  Abdominal MRI 09/11/2020. FINDINGS: CTA CHEST FINDINGS Cardiovascular: Heart size is mildly enlarged. There is coronary artery calcification. There is aortic atherosclerotic calcification. No aneurysm or dissection. The aorta is tortuous. Brachiocephalic vessel origins are patent. Pulmonary arterial tree as well opacified and no emboli are seen. Mediastinum/Nodes: No mediastinal or hilar mass or lymphadenopathy. Lungs/Pleura: No emphysema. Mild pulmonary scarring at the apices and lung bases. No  pleural effusion. No consolidation or collapse. No mass. Musculoskeletal: Thoracic kyphotic curvature. No thoracic region fracture. Review of the MIP images confirms the above findings. CTA ABDOMEN AND PELVIS FINDINGS VASCULAR Aorta: Aortic atherosclerosis.  No aneurysm.  No dissection. Celiac: No significant stenosis. SMA: No significant stenosis. Renals: Bilateral origin calcification with potential moderate stenosis on both sides. IMA: Patent Inflow: Normal Veins: No venous abnormality  seen. Review of the MIP images confirms the above findings. NON-VASCULAR Hepatobiliary: No focal liver parenchymal finding. Mild chronic fullness of the ductal system as previously evaluated. No obstructing lesion seen. Pancreas: Mild fullness of the pancreatic duct as previously seen. No acute pancreatitis. Spleen: Normal Adrenals/Urinary Tract: Adrenal glands are normal. Kidneys show some atrophic change. No focal lesion. Bladder is unremarkable. Stomach/Bowel: No acute or significant bowel finding. No evidence of obstruction. No evidence of enteritis. No evidence of diverticulitis. Lymphatic: No adenopathy. Reproductive: Previous hysterectomy.  No pelvic mass. Other: No free fluid or air. Musculoskeletal: Scoliosis and degenerative change of the spine. Question healed or healing sacral insufficiency fractures. Review of the MIP images confirms the above findings. IMPRESSION: No aortic dissection. Mild cardiomegaly. Coronary artery calcification. Aortic atherosclerotic calcification. Calcification at both renal artery origins. There could be flow-limiting renal artery stenosis. No acute abdominal organ pathology. Chronic prominence of the biliary tree and pancreatic duct, recently evaluated by MRI, without significant cause. Electronically Signed   By: Paulina Fusi M.D.   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 -C diff negative, GI pathogen panel in  progress -BUN 7/ Cr 0.89 as of 09/19/20 -Hypokalemia (3.1)  Syncope: unclear etiology, possibly due to heat/dehydration (90-100F outdoors).  Diarrhea actually did not start until after syncopal episode, per patient  Plan: If GI pathogen panel is negative, consider trial of Budesonide 9 mg daily for 12 weeks.  Start cholestyramine, as there may be a component of post-cholecystectomy diarrhea as well.  Continue supportive care.   [facility] will follow.   LOS: 0 days   Edrick Kins  PA-C 09/20/2020, 12:06 PM  Contact #  [phone]

## 2020-09-20 NOTE — ED Notes (Signed)
Patient had 1 episode of loose stool, small amount. Bed linens change, peri care performed, new brief in place.

## 2020-09-20 NOTE — Progress Notes (Signed)
PROGRESS NOTE    Wanda Valdez  WUJ:[id number] DOB: 1946-01-20 DOA: 09/19/2020 PCP: Doreen Salvage, PA-C   Brief Narrative:  This 75 years old female with PMH significant for hypertension, PUD, ongoing smoking, chronic steroid use who was admitted in the hospital from 7/12-7/15 for 2 weeks history of dysphagia, diarrhea, decreased p.o. intake and 20 pound weight loss since February.  Work-up that admission including EGD and colonoscopy demonstrated mild colitis,  biopsies were taken.  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  Now patient presented in the ED after having a syncopal episode at home.  Patient was feeling very weak today, she was sitting outside reportedly had a syncopal episode,  daughter reports providing chest compression while EMS was arrived though she has pulses on EMS arrival.  Patient noted to be hypotensive with a systolic BP in 70s on EMS arrival.  Blood pressure has improved after she was given IV fluid boluses UA was contaminated by stool. Patient was admitted for syncopal episode.  Assessment & Plan:   Principal Problem:   Syncope Active Problems:   Dysphagia   Diarrhea   Dehydration   Lymphocytic colitis   Protein-calorie malnutrition, severe   Current chronic use of systemic steroids   Syncope: Patient presented with hypotension and syncopal episode,  Most likely secondary to dehydration in the setting of chronic diarrhea from incompletely treated lymphocytic colitis,  poor p.o. intake,  Lasix use,  dehydration from being outside.  Blood pressure improved with IV fluids. 2D echocardiogram shows LVEF 60 to 65% no regional wall motion abnormalities. Continue gentle IV hydration.  Lymphocytic colitis: It seems like it was incompletely treated with prednisone. Patient presented with ongoing chronic diarrhea decreased p.o. intake Continue loperamide as needed. GI consulted recommended continue supportive care, Imodium.  if she  has recurrent diarrhea may benefit from budesonide for early lymphocytic colitis. GI pathogen panel is negative, C. difficile is negative, start cholestyramine. Diarrhea has improved.  Patient had EGD and colonoscopy on last admission.  Findings consistent with early lymphocytic colitis  Chronic steroid use: Patient had been on prednisone before hospital admission last week and since a diagnosis of lymphocytic colitis. Continue prednisone 10 mg p.o. daily  HTN: Hold blood pressure medication since she has hypertension.  Anemia: Hemoglobin is stable. Hb > 9.2 Could be due to colitis.   DVT prophylaxis: SCDs Code Status: Full code. Family Communication:No family at bed side. Disposition Plan:   Status is: Observation  The patient remains OBS appropriate and will d/c before 2 midnights.  Dispo: The patient is from: Home              Anticipated d/c is to: Home              Patient currently is not medically stable to d/c.   Difficult to place patient No  Consultants:  GI  Procedures:  Antimicrobials:   Anti-infectives (From admission, onward)    Start     Dose/Rate Route Frequency Ordered Stop   09/19/20 2015  ceFEPIme (MAXIPIME) 2 g in sodium chloride 0.9 % 100 mL IVPB        2 g 200 mL/hr over 30 Minutes Intravenous  Once 09/19/20 2014 09/19/20 2110       Subjective: Patient was seen and examined at bedside.  She reports feeling better still feels weak denies any dizziness palpitation or shortness of breath or chest pain.  Objective: Vitals:   09/20/20 0630 09/20/20 0930 09/20/20 1230 09/20/20 1358  BP: (!) 117/92 139/86 125/82 121/78  Pulse: 62 64 64 (!) 59  Resp: 17 14 17 20   Temp:    97.7 F (36.5 C)  TempSrc:    Oral  SpO2: 100% 100% 99% 100%  Weight:      Height:        Intake/Output Summary (Last 24 hours) at 09/20/2020 1625 Last data filed at 09/20/2020 1514 Gross per 24 hour  Intake 2656.28 ml  Output --  Net 2656.28 ml   Filed Weights    09/19/20 1813  Weight: 41.2 kg    Examination:  General exam: Appears calm and comfortable, not in any acute distress Respiratory system: Clear to auscultation. Respiratory effort normal. Cardiovascular system: S1 & S2 heard, RRR. No JVD, murmurs, rubs, gallops or clicks. No pedal edema. Gastrointestinal system: Abdomen is nondistended, soft and nontender. No organomegaly or masses felt. Normal bowel sounds heard. Central nervous system: Alert and oriented. No focal neurological deficits. Extremities: Symmetric 5 x 5 power. Skin: No rashes, lesions or ulcers Psychiatry: Judgement and insight appear normal. Mood & affect appropriate.     Data Reviewed: I have personally reviewed following labs and imaging studies  CBC: Recent Labs  Lab 09/19/20 1814 09/19/20 1844  WBC 4.9  --   NEUTROABS 2.3  --   HGB 9.2* 9.2*  HCT 28.1* 27.0*  MCV 105.6*  --   PLT 174  --    Basic Metabolic Panel: Recent Labs  Lab 09/19/20 1814 09/19/20 1844  NA 142 142  K 3.1* 3.7  CL 113* 113*  CO2 20*  --   GLUCOSE 94 83  BUN 7* 5*  CREATININE 0.89 0.90  CALCIUM 8.2*  --    GFR: Estimated Creatinine Clearance: 34.3 mL/min (by C-G formula based on SCr of 0.9 mg/dL). Liver Function Tests: Recent Labs  Lab 09/19/20 1814  AST 31  ALT 29  ALKPHOS 79  BILITOT 0.3  PROT 4.6*  ALBUMIN 2.3*   No results for input(s): LIPASE, AMYLASE in the last 168 hours. No results for input(s): AMMONIA in the last 168 hours. Coagulation Profile: No results for input(s): INR, PROTIME in the last 168 hours. Cardiac Enzymes: No results for input(s): CKTOTAL, CKMB, CKMBINDEX, TROPONINI in the last 168 hours. BNP (last 3 results) No results for input(s): PROBNP in the last 8760 hours. HbA1C: No results for input(s): HGBA1C in the last 72 hours. CBG: Recent Labs  Lab 09/20/20 0623  GLUCAP 70   Lipid Profile: No results for input(s): CHOL, HDL, LDLCALC, TRIG, CHOLHDL, LDLDIRECT in the last 72  hours. Thyroid Function Tests: No results for input(s): TSH, T4TOTAL, FREET4, T3FREE, THYROIDAB in the last 72 hours. Anemia Panel: No results for input(s): VITAMINB12, FOLATE, FERRITIN, TIBC, IRON, RETICCTPCT in the last 72 hours. Sepsis Labs: Recent Labs  Lab 09/19/20 1814 09/19/20 2014  LATICACIDVEN 3.0* 2.3*    Recent Results (from the past 240 hour(s))  Gastrointestinal Panel by PCR , Stool     Status: None   Collection Time: 09/10/20  5:35 PM   Specimen: Stool  Result Value Ref Range Status   Campylobacter species NOT DETECTED NOT DETECTED Final   Plesimonas shigelloides NOT DETECTED NOT DETECTED Final   Salmonella species NOT DETECTED NOT DETECTED Final   Yersinia enterocolitica NOT DETECTED NOT DETECTED Final   Vibrio species NOT DETECTED NOT DETECTED Final   Vibrio cholerae NOT DETECTED NOT DETECTED Final   Enteroaggregative E coli (EAEC) NOT DETECTED NOT DETECTED Final   Enteropathogenic  E coli (EPEC) NOT DETECTED NOT DETECTED Final   Enterotoxigenic E coli (ETEC) NOT DETECTED NOT DETECTED Final   Shiga like toxin producing E coli (STEC) NOT DETECTED NOT DETECTED Final   Shigella/Enteroinvasive E coli (EIEC) NOT DETECTED NOT DETECTED Final   Cryptosporidium NOT DETECTED NOT DETECTED Final   Cyclospora cayetanensis NOT DETECTED NOT DETECTED Final   Entamoeba histolytica NOT DETECTED NOT DETECTED Final   Giardia lamblia NOT DETECTED NOT DETECTED Final   Adenovirus F40/41 NOT DETECTED NOT DETECTED Final   Astrovirus NOT DETECTED NOT DETECTED Final   Norovirus GI/GII NOT DETECTED NOT DETECTED Final   Rotavirus A NOT DETECTED NOT DETECTED Final   Sapovirus (I, II, IV, and V) NOT DETECTED NOT DETECTED Final    Comment: Performed at [facility], [address], [place], [place] [address]  C Difficile Quick Screen w PCR reflex     Status: None   Collection Time: 09/10/20  5:35 PM   Specimen: Stool  Result Value Ref Range Status   C Diff antigen NEGATIVE  NEGATIVE Final   C Diff toxin NEGATIVE NEGATIVE Final   C Diff interpretation No C. difficile detected.  Final    Comment: Performed at [facility], [address], [place], [place] [address]  Blood culture (routine x 2)     Status: None (Preliminary result)   Collection Time: 09/19/20  6:14 PM   Specimen: BLOOD  Result Value Ref Range Status   Specimen Description   Final    BLOOD BLOOD LEFT FOREARM Performed at [facility], [address], [place], [place] [address]    Special Requests   Final    BOTTLES DRAWN AEROBIC AND ANAEROBIC Blood Culture adequate volume Performed at [facility], [address] [place], [place] [address]    Culture   Final    NO GROWTH < 12 HOURS Performed at [facility], [address], [place], [place] [address]    Report Status PENDING  Incomplete  Resp Panel by RT-PCR (Flu A&B, Covid) Nasopharyngeal Swab     Status: None   Collection Time: 09/19/20  6:15 PM   Specimen: Nasopharyngeal Swab; Nasopharyngeal(NP) swabs in vial transport medium  Result Value Ref Range Status   SARS Coronavirus 2 by RT PCR NEGATIVE NEGATIVE Final    Comment: (NOTE) SARS-CoV-2 target nucleic acids are NOT DETECTED.  The SARS-CoV-2 RNA is generally detectable in upper respiratory specimens during the acute phase of infection. The lowest concentration of SARS-CoV-2 viral copies this assay can detect is 138 copies/mL. A negative result does not preclude SARS-Cov-2 infection and should not be used as the sole basis for treatment or other patient management decisions. A negative result may occur with  improper specimen collection/handling, submission of specimen other than nasopharyngeal swab, presence of viral mutation(s) within the areas targeted by this assay, and inadequate number of viral copies(<138 copies/mL). A negative result must be combined with clinical observations, patient history, and  epidemiological information. The expected result is Negative.  Fact Sheet for Patients:  BloggerCourse.com  Fact Sheet for Healthcare Providers:  SeriousBroker.it  This test is no t yet approved or cleared by the [place] FDA and  has been authorized for detection and/or diagnosis of SARS-CoV-2 by FDA under an Emergency Use Authorization (EUA). This EUA will remain  in effect (meaning this test can be used) for the duration of the COVID-19 declaration under Section 564(b)(1) of the Act, 21 U.S.C.section 360bbb-3(b)(1),  unless the authorization is terminated  or revoked sooner.       Influenza A by PCR NEGATIVE NEGATIVE Final   Influenza B by PCR NEGATIVE NEGATIVE Final    Comment: (NOTE) The Xpert Xpress SARS-CoV-2/FLU/RSV plus assay is intended as an aid in the diagnosis of influenza from Nasopharyngeal swab specimens and should not be used as a sole basis for treatment. Nasal washings and aspirates are unacceptable for Xpert Xpress SARS-CoV-2/FLU/RSV testing.  Fact Sheet for Patients: BloggerCourse.com  Fact Sheet for Healthcare Providers: SeriousBroker.it  This test is not yet approved or cleared by the [place] FDA and has been authorized for detection and/or diagnosis of SARS-CoV-2 by FDA under an Emergency Use Authorization (EUA). This EUA will remain in effect (meaning this test can be used) for the duration of the COVID-19 declaration under Section 564(b)(1) of the Act, 21 U.S.C. section 360bbb-3(b)(1), unless the authorization is terminated or revoked.  Performed at [facility], [address], [place], [place] [address]   C Difficile Quick Screen w PCR reflex     Status: None   Collection Time: 09/19/20 10:00 PM   Specimen: STOOL  Result Value Ref Range Status   C Diff antigen NEGATIVE NEGATIVE Final   C Diff toxin NEGATIVE  NEGATIVE Final   C Diff interpretation No C. difficile detected.  Final    Comment: Performed at [facility], [address], [place], [place] [address]  Gastrointestinal Panel by PCR , Stool     Status: None   Collection Time: 09/19/20 10:00 PM   Specimen: STOOL  Result Value Ref Range Status   Campylobacter species NOT DETECTED NOT DETECTED Final   Plesimonas shigelloides NOT DETECTED NOT DETECTED Final   Salmonella species NOT DETECTED NOT DETECTED Final   Yersinia enterocolitica NOT DETECTED NOT DETECTED Final   Vibrio species NOT DETECTED NOT DETECTED Final   Vibrio cholerae NOT DETECTED NOT DETECTED Final   Enteroaggregative E coli (EAEC) NOT DETECTED NOT DETECTED Final   Enteropathogenic E coli (EPEC) NOT DETECTED NOT DETECTED Final   Enterotoxigenic E coli (ETEC) NOT DETECTED NOT DETECTED Final   Shiga like toxin producing E coli (STEC) NOT DETECTED NOT DETECTED Final   Shigella/Enteroinvasive E coli (EIEC) NOT DETECTED NOT DETECTED Final   Cryptosporidium NOT DETECTED NOT DETECTED Final   Cyclospora cayetanensis NOT DETECTED NOT DETECTED Final   Entamoeba histolytica NOT DETECTED NOT DETECTED Final   Giardia lamblia NOT DETECTED NOT DETECTED Final   Adenovirus F40/41 NOT DETECTED NOT DETECTED Final   Astrovirus NOT DETECTED NOT DETECTED Final   Norovirus GI/GII NOT DETECTED NOT DETECTED Final   Rotavirus A NOT DETECTED NOT DETECTED Final   Sapovirus (I, II, IV, and V) NOT DETECTED NOT DETECTED Final    Comment: Performed at [facility], [address] [place], [place] [address]     Radiology Studies: DG Chest Port 1 View  Result Date: 09/19/2020 CLINICAL DATA:  Syncope EXAM: PORTABLE CHEST 1 VIEW COMPARISON:  None. FINDINGS: Widened mediastinum. No focal airspace disease. No pleural effusion or visible pneumothorax. No acute osseous abnormality. IMPRESSION: Widened mediastinum, recommend CTA of the chest to rule out acute aortic pathology. No  focal airspace disease. Electronically Signed   By: Caprice Renshaw   On: 09/19/2020 19:13   ECHOCARDIOGRAM COMPLETE  Result Date: 09/20/2020    ECHOCARDIOGRAM REPORT   Patient Name:   LARRY KNIPP Date of Exam: 09/20/2020 Medical Rec #:  [id number]  Height:       58.0 in Accession #:    [id number]         Weight:       90.8 lb Date of Birth:  11-16-1945          BSA:          1.302 m Patient Age:    74 years           BP:           117/92 mmHg Patient Gender: F                  HR:           71 bpm. Exam Location:  Inpatient Procedure: 2D Echo, Color Doppler and Cardiac Doppler Indications:    R55 Syncope  History:        Patient has no prior history of Echocardiogram examinations.                 Risk Factors:Hypertension.  Sonographer:    Irving Burton Senior RDCS Referring Phys: [phone] [name] GARDNER  Sonographer Comments: Technically difficult due to small body habitus IMPRESSIONS  1. Elevated velocity of 3.2 m/s in ascending aorta of uncertain etiology; suggest CTA of thoracic aorta to further assess.  2. Left ventricular ejection fraction, by estimation, is 60 to 65%. The left ventricle has normal function. The left ventricle has no regional wall motion abnormalities. There is mild left ventricular hypertrophy. Left ventricular diastolic parameters are consistent with Grade I diastolic dysfunction (impaired relaxation). Elevated left atrial pressure.  3. Right ventricular systolic function is normal. The right ventricular size is normal.  4. The mitral valve is normal in structure. No evidence of mitral valve regurgitation. No evidence of mitral stenosis.  5. The aortic valve has an indeterminant number of cusps. Aortic valve regurgitation is not visualized. No aortic stenosis is present.  6. There is mild dilatation of the aortic root, measuring 39 mm.  7. The inferior vena cava is normal in size with greater than 50% respiratory variability, suggesting right atrial pressure of 3 mmHg. FINDINGS  Left  Ventricle: Left ventricular ejection fraction, by estimation, is 60 to 65%. The left ventricle has normal function. The left ventricle has no regional wall motion abnormalities. The left ventricular internal cavity size was normal in size. There is  mild left ventricular hypertrophy. Left ventricular diastolic parameters are consistent with Grade I diastolic dysfunction (impaired relaxation). Elevated left atrial pressure. Right Ventricle: The right ventricular size is normal. Right ventricular systolic function is normal. Left Atrium: Left atrial size was normal in size. Right Atrium: Right atrial size was normal in size. Pericardium: Trivial pericardial effusion is present. Mitral Valve: The mitral valve is normal in structure. No evidence of mitral valve regurgitation. No evidence of mitral valve stenosis. Tricuspid Valve: The tricuspid valve is normal in structure. Tricuspid valve regurgitation is trivial. No evidence of tricuspid stenosis. Aortic Valve: The aortic valve has an indeterminant number of cusps. Aortic valve regurgitation is not visualized. No aortic stenosis is present. Pulmonic Valve: The pulmonic valve was not well visualized. Pulmonic valve regurgitation is not visualized. No evidence of pulmonic stenosis. Aorta: The aortic root is normal in size and structure. There is mild dilatation of the aortic root, measuring 39 mm. Venous: The inferior vena cava is normal in size with greater than 50% respiratory variability, suggesting right atrial pressure of 3 mmHg. IAS/Shunts: The interatrial septum was not well visualized. Additional Comments: Elevated  velocity of 3.2 m/s in ascending aorta of uncertain etiology; suggest CTA of thoracic aorta to further assess.  LEFT VENTRICLE PLAX 2D LVIDd:         2.30 cm  Diastology LVIDs:         1.50 cm  LV e' medial:    3.81 cm/s LV PW:         0.80 cm  LV E/e' medial:  15.1 LV IVS:        0.80 cm  LV e' lateral:   4.57 cm/s LVOT diam:     1.90 cm  LV E/e'  lateral: 12.6 LV SV:         56 LV SV Index:   43 LVOT Area:     2.84 cm  RIGHT VENTRICLE RV S prime:     12.80 cm/s TAPSE (M-mode): 1.8 cm LEFT ATRIUM           Index       RIGHT ATRIUM          Index LA diam:      2.20 cm 1.69 cm/m  RA Area:     9.84 cm LA Vol (A2C): 34.6 ml 26.57 ml/m RA Volume:   17.60 ml 13.52 ml/m LA Vol (A4C): 38.5 ml 29.57 ml/m  AORTIC VALVE LVOT Vmax:   90.70 cm/s LVOT Vmean:  55.900 cm/s LVOT VTI:    0.197 m  AORTA Ao Root diam: 3.90 cm MITRAL VALVE MV Area (PHT): 2.80 cm    SHUNTS MV Decel Time: 271 msec    Systemic VTI:  0.20 m MV E velocity: 57.40 cm/s  Systemic Diam: 1.90 cm MV A velocity: 95.10 cm/s MV E/A ratio:  0.60 Olga MillersBrian Crenshaw MD Electronically signed by Olga MillersBrian Crenshaw MD Signature Date/Time: 09/20/2020/1:51:38 PM    Final    CT Angio Chest/Abd/Pel for Dissection W and/or Wo Contrast  Result Date: 09/19/2020 CLINICAL DATA:  Abdominal pain. Aortic dissection suspected. Syncopal episode. Hypotension. Diffuse abdominal pain. EXAM: CT ANGIOGRAPHY CHEST, ABDOMEN AND PELVIS TECHNIQUE: Multidetector CT imaging through the chest, abdomen and pelvis was performed using the standard protocol during bolus administration of intravenous contrast. Multiplanar reconstructed images and MIPs were obtained and reviewed to evaluate the vascular anatomy. CONTRAST:  100mL OMNIPAQUE IOHEXOL 350 MG/ML SOLN COMPARISON:  Chest radiography same day.  Abdominal MRI 09/11/2020. FINDINGS: CTA CHEST FINDINGS Cardiovascular: Heart size is mildly enlarged. There is coronary artery calcification. There is aortic atherosclerotic calcification. No aneurysm or dissection. The aorta is tortuous. Brachiocephalic vessel origins are patent. Pulmonary arterial tree as well opacified and no emboli are seen. Mediastinum/Nodes: No mediastinal or hilar mass or lymphadenopathy. Lungs/Pleura: No emphysema. Mild pulmonary scarring at the apices and lung bases. No pleural effusion. No consolidation or collapse. No  mass. Musculoskeletal: Thoracic kyphotic curvature. No thoracic region fracture. Review of the MIP images confirms the above findings. CTA ABDOMEN AND PELVIS FINDINGS VASCULAR Aorta: Aortic atherosclerosis.  No aneurysm.  No dissection. Celiac: No significant stenosis. SMA: No significant stenosis. Renals: Bilateral origin calcification with potential moderate stenosis on both sides. IMA: Patent Inflow: Normal Veins: No venous abnormality seen. Review of the MIP images confirms the above findings. NON-VASCULAR Hepatobiliary: No focal liver parenchymal finding. Mild chronic fullness of the ductal system as previously evaluated. No obstructing lesion seen. Pancreas: Mild fullness of the pancreatic duct as previously seen. No acute pancreatitis. Spleen: Normal Adrenals/Urinary Tract: Adrenal glands are normal. Kidneys show some atrophic change. No focal lesion. Bladder is unremarkable. Stomach/Bowel: No acute  or significant bowel finding. No evidence of obstruction. No evidence of enteritis. No evidence of diverticulitis. Lymphatic: No adenopathy. Reproductive: Previous hysterectomy.  No pelvic mass. Other: No free fluid or air. Musculoskeletal: Scoliosis and degenerative change of the spine. Question healed or healing sacral insufficiency fractures. Review of the MIP images confirms the above findings. IMPRESSION: No aortic dissection. Mild cardiomegaly. Coronary artery calcification. Aortic atherosclerotic calcification. Calcification at both renal artery origins. There could be flow-limiting renal artery stenosis. No acute abdominal organ pathology. Chronic prominence of the biliary tree and pancreatic duct, recently evaluated by MRI, without significant cause. Electronically Signed   By: Paulina Fusi M.D.   On: 09/19/2020 20:20     Scheduled Meds:  allopurinol  100 mg Oral Daily   cholestyramine  4 g Oral BID   multivitamin with minerals  1 tablet Oral Daily   pantoprazole  40 mg Oral Daily   potassium  chloride SA  20 mEq Oral Daily   predniSONE  10 mg Oral Q breakfast   sodium chloride flush  3 mL Intravenous Q12H   sucralfate  1 g Oral TID   Continuous Infusions:  lactated ringers 75 mL/hr at 09/20/20 1457     LOS: 0 days    Time spent: 35 mins    Whitleigh Garramone, MD [facility]   If 7PM-7AM, please contact night-coverage

## 2020-09-20 NOTE — ED Notes (Signed)
Pt repositioned in bed. Pt given meal tray

## 2020-09-21 DIAGNOSIS — R55 Syncope and collapse: Secondary | ICD-10-CM | POA: Diagnosis not present

## 2020-09-21 LAB — BASIC METABOLIC PANEL
Anion gap: 8 (ref 5–15)
BUN: 6 mg/dL — ABNORMAL LOW (ref 8–23)
CO2: 23 mmol/L (ref 22–32)
Calcium: 8 mg/dL — ABNORMAL LOW (ref 8.9–10.3)
Chloride: 109 mmol/L (ref 98–111)
Creatinine, Ser: 0.82 mg/dL (ref 0.44–1.00)
GFR, Estimated: >60 mL/min (ref >60)
Glucose, Bld: 84 mg/dL (ref 70–99)
Potassium: 2.6 mmol/L — CL (ref 3.5–5.1)
Sodium: 140 mmol/L (ref 135–145)

## 2020-09-21 LAB — MAGNESIUM: Magnesium: 1.3 mg/dL — ABNORMAL LOW (ref 1.7–2.4)

## 2020-09-21 LAB — HEMOGLOBIN AND HEMATOCRIT, BLOOD
HCT: 28.4 % — ABNORMAL LOW (ref 36.0–46.0)
Hemoglobin: 9.4 g/dL — ABNORMAL LOW (ref 12.0–15.0)

## 2020-09-21 LAB — PHOSPHORUS: Phosphorus: 2.7 mg/dL (ref 2.5–4.6)

## 2020-09-21 LAB — URINE CULTURE: Culture: <10000 — AB

## 2020-09-21 LAB — GLUCOSE, CAPILLARY: Glucose-Capillary: 77 mg/dL (ref 70–99)

## 2020-09-21 MED ORDER — POTASSIUM CHLORIDE 10 MEQ/100ML IV SOLN
INTRAVENOUS | Status: AC
  Administered 2020-09-21: 10 meq via INTRAVENOUS
  Filled 2020-09-21: qty 100

## 2020-09-21 MED ORDER — POTASSIUM CHLORIDE 20 MEQ PO PACK
40.0000 meq | PACK | Freq: Once | ORAL | Status: AC
  Administered 2020-09-21: 40 meq via ORAL
  Filled 2020-09-21: qty 2

## 2020-09-21 MED ORDER — POTASSIUM CHLORIDE 10 MEQ/100ML IV SOLN
10.0000 meq | INTRAVENOUS | Status: AC
  Administered 2020-09-21: 10 meq via INTRAVENOUS
  Filled 2020-09-21 (×2): qty 100

## 2020-09-21 MED ORDER — MAGNESIUM SULFATE 2 GM/50ML IV SOLN
2.0000 g | Freq: Once | INTRAVENOUS | Status: AC
  Administered 2020-09-21: 2 g via INTRAVENOUS
  Filled 2020-09-21: qty 50

## 2020-09-21 MED ORDER — POTASSIUM CHLORIDE 10 MEQ/100ML IV SOLN: 10.0000 meq | INTRAVENOUS | Status: DC

## 2020-09-21 MED ORDER — POTASSIUM CHLORIDE CRYS ER 20 MEQ PO TBCR
40.0000 meq | EXTENDED_RELEASE_TABLET | Freq: Every day | ORAL | Status: DC
  Administered 2020-09-21 – 2020-09-22 (×2): 40 meq via ORAL
  Filled 2020-09-21 (×2): qty 2

## 2020-09-21 NOTE — Plan of Care (Signed)
  Problem: Education: Goal: Knowledge of condition and prescribed therapy will improve Outcome: Progressing   Problem: Cardiac: Goal: Will achieve and/or maintain adequate cardiac output Outcome: Progressing   Problem: Physical Regulation: Goal: Complications related to the disease process, condition or treatment will be avoided or minimized 09/21/2020 1625 by Iva Boop, RN Outcome: Progressing 09/21/2020 1411 by Iva Boop, RN Outcome: Progressing   Problem: Education: Goal: Knowledge of General Education information will improve Description: Including pain rating scale, medication(s)/side effects and non-pharmacologic comfort measures Outcome: Progressing   Problem: Health Behavior/Discharge Planning: Goal: Ability to manage health-related needs will improve Outcome: Progressing   Problem: Clinical Measurements: Goal: Ability to maintain clinical measurements within normal limits will improve Outcome: Progressing Goal: Will remain free from infection Outcome: Progressing Goal: Diagnostic test results will improve Outcome: Progressing Goal: Respiratory complications will improve Outcome: Progressing Goal: Cardiovascular complication will be avoided Outcome: Progressing   Problem: Activity: Goal: Risk for activity intolerance will decrease Outcome: Progressing   Problem: Coping: Goal: Level of anxiety will decrease Outcome: Progressing   Problem: Elimination: Goal: Will not experience complications related to bowel motility Outcome: Progressing   Problem: Pain Managment: Goal: General experience of comfort will improve Outcome: Progressing   Problem: Safety: Goal: Ability to remain free from injury will improve Outcome: Progressing   Problem: Skin Integrity: Goal: Risk for impaired skin integrity will decrease Outcome: Progressing

## 2020-09-21 NOTE — Evaluation (Signed)
Physical Therapy Evaluation Patient Details Name: Wanda Valdez MRN: [id number] DOB: 1945/10/27 Today's Date: 09/21/2020   History of Present Illness  75 years old female admitted 09/19/20 for syncopal episode most likely secondary to dehydration from diarrhea. PMH significant for hypertension, PUD, ongoing smoking, chronic steroid use who was admitted in the hospital from 7/12-7/15 for 2 weeks history of dysphagia, diarrhea, decreased p.o. intake and 20 pound weight loss since February.  Work-up that admission including EGD and colonoscopy demonstrated mild colitis,  biopsies were taken.  Clinical Impression  Pt admitted with above diagnosis. Pt currently with functional limitations due to the deficits listed below (see PT Problem List). Pt will benefit from skilled PT to increase their independence and safety with mobility to allow discharge to the venue listed below.   Pt agreeable to mobilize and ambulated short distance in hallway.  Pt reports generalized weakness prior to admission.  Pt would benefit from youth RW upon d/c.  Pt reports she lives with her 2 daughters who can assist if needed upon d/c.     Follow Up Recommendations Home health PT    Equipment Recommendations  Rolling walker with 5" wheels (youth)   Recommendations for Other Services       Precautions / Restrictions Precautions Precautions: Fall      Mobility  Bed Mobility Overal bed mobility: Needs Assistance Bed Mobility: Supine to Sit;Sit to Supine     Supine to sit: Supervision Sit to supine: Supervision        Transfers Overall transfer level: Needs assistance Equipment used: Rolling walker (2 wheeled) Transfers: Sit to/from Stand Sit to Stand: Min guard         General transfer comment: min/guard for safety  Ambulation/Gait Ambulation/Gait assistance: Min guard Gait Distance (Feet): 80 Feet Assistive device: Rolling walker (2 wheeled) Gait Pattern/deviations: Step-through  pattern;Decreased stride length     General Gait Details: slow but steady with RW, distance to tolerance  Stairs            Wheelchair Mobility    Modified Rankin (Stroke Patients Only)       Balance Overall balance assessment: Needs assistance         Standing balance support: No upper extremity supported Standing balance-Leahy Scale: Fair                               Pertinent Vitals/Pain Pain Assessment: No/denies pain    Home Living Family/patient expects to be discharged to:: Private residence Living Arrangements: Children (2 daughters) Available Help at Discharge: Family;Available 24 hours/day Type of Home: House       Home Layout: One level Home Equipment: None      Prior Function Level of Independence: Independent               Hand Dominance        Extremity/Trunk Assessment        Lower Extremity Assessment Lower Extremity Assessment: Generalized weakness    Cervical / Trunk Assessment Cervical / Trunk Assessment: Normal  Communication   Communication: No difficulties  Cognition Arousal/Alertness: Awake/alert Behavior During Therapy: WFL for tasks assessed/performed Overall Cognitive Status: Within Functional Limits for tasks assessed                                        General Comments  Exercises     Assessment/Plan    PT Assessment Patient needs continued PT services  PT Problem List Decreased knowledge of use of DME;Decreased strength;Decreased mobility;Decreased activity tolerance       PT Treatment Interventions Gait training;Therapeutic exercise;DME instruction;Balance training;Functional mobility training;Therapeutic activities;Patient/family education    PT Goals (Current goals can be found in the Care Plan section)  Acute Rehab PT Goals PT Goal Formulation: With patient Time For Goal Achievement: 10/05/20 Potential to Achieve Goals: Good    Frequency Min 3X/week    Barriers to discharge        Co-evaluation               AM-PAC PT "6 Clicks" Mobility  Outcome Measure Help needed turning from your back to your side while in a flat bed without using bedrails?: A Little Help needed moving from lying on your back to sitting on the side of a flat bed without using bedrails?: A Little Help needed moving to and from a bed to a chair (including a wheelchair)?: A Little Help needed standing up from a chair using your arms (e.g., wheelchair or bedside chair)?: A Little Help needed to walk in hospital room?: A Little Help needed climbing 3-5 steps with a railing? : A Little 6 Click Score: 18    End of Session Equipment Utilized During Treatment: Gait belt Activity Tolerance: Patient tolerated treatment well Patient left: in bed;with call bell/phone within reach Nurse Communication: Mobility status PT Visit Diagnosis: Difficulty in walking, not elsewhere classified (R26.2);Muscle weakness (generalized) (M62.81)    Time: 6222-9798 PT Time Calculation (min) (ACUTE ONLY): 18 min   Charges:   PT Evaluation $PT Eval Low Complexity: 1 Low         Kati PT, DPT Acute Rehabilitation Services Pager: [phone] Office: [phone]   Maida Sale E 09/21/2020, 3:03 PM

## 2020-09-21 NOTE — Progress Notes (Signed)
Subjective: Diarrhea resolved.  Objective: Vital signs in last 24 hours: Temp:  [97.7 F (36.5 C)-98.7 F (37.1 C)] 98.3 F (36.8 C) (07/23 0446) Pulse Rate:  [58-64] 58 (07/23 0446) Resp:  [17-20] 20 (07/23 0446) BP: (121-142)/(70-84) 142/70 (07/23 0446) SpO2:  [99 %-100 %] 99 % (07/23 0446) Weight:  [42.4 kg] 42.4 kg (07/23 0452) Weight change: 1.2 kg Last BM Date: 09/21/20  PE: GEN:  NAD ABD:  Soft, non-tender  Lab Results: CBC    Component Value Date/Time   WBC 4.9 09/19/2020 1814   RBC 2.66 (L) 09/19/2020 1814   HGB 9.4 (L) 09/21/2020 0420   HCT 28.4 (L) 09/21/2020 0420   PLT 174 09/19/2020 1814   MCV 105.6 (H) 09/19/2020 1814   MCH 34.6 (H) 09/19/2020 1814   MCHC 32.7 09/19/2020 1814   RDW 15.6 (H) 09/19/2020 1814   LYMPHSABS 1.8 09/19/2020 1814   MONOABS 0.5 09/19/2020 1814   EOSABS 0.2 09/19/2020 1814   BASOSABS 0.0 09/19/2020 1814  CMP     Component Value Date/Time   NA 140 09/21/2020 0420   K 2.6 (LL) 09/21/2020 0420   CL 109 09/21/2020 0420   CO2 23 09/21/2020 0420   GLUCOSE 84 09/21/2020 0420   BUN 6 (L) 09/21/2020 0420   CREATININE 0.82 09/21/2020 0420   CALCIUM 8.0 (L) 09/21/2020 0420   PROT 4.6 (L) 09/19/2020 1814   ALBUMIN 2.3 (L) 09/19/2020 1814   AST 31 09/19/2020 1814   ALT 29 09/19/2020 1814   ALKPHOS 79 09/19/2020 1814   BILITOT 0.3 09/19/2020 1814   GFRNONAA >60 09/21/2020 0420   Assessment:   Diarrhea, improving, recent diagnosis lymphocytic colitis. Weakness, dizziness, (pre)syncope.  Started BEFORE her diarrhea, so doubt this is all dehydration-related.  Plan:   Loperamide prn for diarrhea.  Do not see need for budesonide at the present time. No further GI work-up anticipated. [facility] will sign-off; we can make outpatient follow-up with Dr. Levora Angel.  Thank you for consultation; please call with any questions.   Wanda Valdez 09/21/2020, 10:12 AM   Cell [phone] If no answer or after 5 PM call [phone]

## 2020-09-21 NOTE — Progress Notes (Signed)
PROGRESS NOTE    Wanda Valdez  FWY:[id number] DOB: 10/27/1945 DOA: 09/19/2020 PCP: Doreen Salvage, PA-C   Brief Narrative:  This 75 years old female with PMH significant for hypertension, PUD, ongoing smoking, chronic steroid use who was admitted in the hospital from 7/12-7/15 for 2 weeks history of dysphagia, diarrhea, decreased p.o. intake and 20 pound weight loss since February.  Work-up that admission including EGD and colonoscopy demonstrated mild colitis,  biopsies were taken.  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  Now patient presented in the ED after having a syncopal episode at home.  Patient was feeling very weak today, she was sitting outside reportedly had a syncopal episode,  daughter reports providing chest compression while EMS was arrived though she has pulses on EMS arrival.  Patient noted to be hypotensive with a systolic BP in 70s on EMS arrival.  Blood pressure has improved after she was given IV fluid boluses UA was contaminated by stool. Patient was admitted for syncopal episode most likely secondary to dehydration from diarrhea.  Assessment & Plan:   Principal Problem:   Syncope Active Problems:   Dysphagia   Diarrhea   Dehydration   Lymphocytic colitis   Protein-calorie malnutrition, severe   Current chronic use of systemic steroids   Syncope: Patient presented with hypotension and syncopal episode.  Most likely secondary to dehydration in the setting of chronic diarrhea from incompletely treated lymphocytic colitis,  poor p.o. intake,  Lasix use,  dehydration from being outside.  Blood pressure improved with IV fluids. 2D echocardiogram shows LVEF 60 to 65%, no regional wall motion abnormalities. Continue gentle IV hydration.  Lymphocytic colitis: It seems like it was incompletely treated with prednisone. Patient presented with ongoing chronic diarrhea, decreased p.o. intake Continue loperamide as needed. GI consulted  recommended continue supportive care, Imodium. Since patient has improvement in symptoms there is no need for budesonide as per GI. GI pathogen panel is negative, C. difficile is negative, continue cholestyramine. Diarrhea has improved.  Patient had EGD and colonoscopy on last admission.  Findings consistent with early lymphocytic colitis. GI signed off, recommended outpatient GI follow-up.  Chronic steroid use: Patient had been on prednisone before hospital admission last week and since a diagnosis of lymphocytic colitis. Continue prednisone 10 mg p.o. daily  HTN: Blood pressure medications were on hold,  will resume as blood pressure has improved.  Anemia: Hemoglobin is stable. Hb > 9.2 > 9.4 Could be due to colitis.  Hypokalemia and hypomagnesemia: Replacement in process, continue to monitor.  DVT prophylaxis: SCDs Code Status: Full code. Family Communication: No family at bed side. Disposition Plan:   Status is: Inpatient  Remains inpatient appropriate because:Inpatient level of care appropriate due to severity of illness  Dispo: The patient is from: Home              Anticipated d/c is to: Home              Patient currently is not medically stable to d/c.   Difficult to place patient No  Consultants:  GI  Procedures:  Antimicrobials:   Anti-infectives (From admission, onward)    Start     Dose/Rate Route Frequency Ordered Stop   09/19/20 2015  ceFEPIme (MAXIPIME) 2 g in sodium chloride 0.9 % 100 mL IVPB        2 g 200 mL/hr over 30 Minutes Intravenous  Once 09/19/20 2014 09/19/20 2110       Subjective: Patient was seen and examined  at bedside.  She reports feeling better,  still feels weak. She denies any dizziness , palpitations or shortness of breath or chest pain. She is getting potassium replacement.  Objective: Vitals:   09/20/20 2216 09/21/20 0158 09/21/20 0446 09/21/20 0452  BP: 126/84 129/71 (!) 142/70   Pulse: (!) 59 62 (!) 58   Resp: 18 20 20     Temp: 98.7 F (37.1 C) 97.9 F (36.6 C) 98.3 F (36.8 C)   TempSrc: Oral Oral Oral   SpO2: 100% 100% 99%   Weight:    42.4 kg  Height:        Intake/Output Summary (Last 24 hours) at 09/21/2020 1141 Last data filed at 09/21/2020 0958 Gross per 24 hour  Intake 796.28 ml  Output 4 ml  Net 792.28 ml   Filed Weights   09/19/20 1813 09/21/20 0452  Weight: 41.2 kg 42.4 kg    Examination:  General exam: Appears calm and comfortable, not in any acute distress, appears improved. Respiratory system: Clear to auscultation. Respiratory effort normal. Cardiovascular system: S1 & S2 heard, RRR. No JVD, murmurs, rubs, gallops or clicks. No pedal edema. Gastrointestinal system: Abdomen is nondistended, soft and nontender. No organomegaly or masses felt. Normal bowel sounds heard. Central nervous system: Alert and oriented. No focal neurological deficits. Extremities: No edema, no cyanosis, no clubbing. Skin: No rashes, lesions or ulcers Psychiatry: Judgement and insight appear normal. Mood & affect appropriate.     Data Reviewed: I have personally reviewed following labs and imaging studies  CBC: Recent Labs  Lab 09/19/20 1814 09/19/20 1844 09/21/20 0420  WBC 4.9  --   --   NEUTROABS 2.3  --   --   HGB 9.2* 9.2* 9.4*  HCT 28.1* 27.0* 28.4*  MCV 105.6*  --   --   PLT 174  --   --    Basic Metabolic Panel: Recent Labs  Lab 09/19/20 1814 09/19/20 1844 09/21/20 0420  NA 142 142 140  K 3.1* 3.7 2.6*  CL 113* 113* 109  CO2 20*  --  23  GLUCOSE 94 83 84  BUN 7* 5* 6*  CREATININE 0.89 0.90 0.82  CALCIUM 8.2*  --  8.0*  MG  --   --  1.3*  PHOS  --   --  2.7   GFR: Estimated Creatinine Clearance: 37.7 mL/min (by C-G formula based on SCr of 0.82 mg/dL). Liver Function Tests: Recent Labs  Lab 09/19/20 1814  AST 31  ALT 29  ALKPHOS 79  BILITOT 0.3  PROT 4.6*  ALBUMIN 2.3*   No results for input(s): LIPASE, AMYLASE in the last 168 hours. No results for input(s):  AMMONIA in the last 168 hours. Coagulation Profile: No results for input(s): INR, PROTIME in the last 168 hours. Cardiac Enzymes: No results for input(s): CKTOTAL, CKMB, CKMBINDEX, TROPONINI in the last 168 hours. BNP (last 3 results) No results for input(s): PROBNP in the last 8760 hours. HbA1C: No results for input(s): HGBA1C in the last 72 hours. CBG: Recent Labs  Lab 09/20/20 0623 09/21/20 0448  GLUCAP 70 77   Lipid Profile: No results for input(s): CHOL, HDL, LDLCALC, TRIG, CHOLHDL, LDLDIRECT in the last 72 hours. Thyroid Function Tests: No results for input(s): TSH, T4TOTAL, FREET4, T3FREE, THYROIDAB in the last 72 hours. Anemia Panel: No results for input(s): VITAMINB12, FOLATE, FERRITIN, TIBC, IRON, RETICCTPCT in the last 72 hours. Sepsis Labs: Recent Labs  Lab 09/19/20 1814 09/19/20 2014  LATICACIDVEN 3.0* 2.3*  Recent Results (from the past 240 hour(s))  Blood culture (routine x 2)     Status: None (Preliminary result)   Collection Time: 09/19/20  6:14 PM   Specimen: BLOOD  Result Value Ref Range Status   Specimen Description   Final    BLOOD BLOOD LEFT FOREARM Performed at [facility], [address] [place], [place] [address]    Special Requests   Final    BOTTLES DRAWN AEROBIC AND ANAEROBIC Blood Culture adequate volume Performed at [facility], [address] [place], [place] [address]    Culture   Final    NO GROWTH 2 DAYS Performed at [facility], [address], [place], [place] [address]    Report Status PENDING  Incomplete  Resp Panel by RT-PCR (Flu A&B, Covid) Nasopharyngeal Swab     Status: None   Collection Time: 09/19/20  6:15 PM   Specimen: Nasopharyngeal Swab; Nasopharyngeal(NP) swabs in vial transport medium  Result Value Ref Range Status   SARS Coronavirus 2 by RT PCR NEGATIVE NEGATIVE Final    Comment: (NOTE) SARS-CoV-2 target nucleic acids are NOT DETECTED.  The SARS-CoV-2 RNA is  generally detectable in upper respiratory specimens during the acute phase of infection. The lowest concentration of SARS-CoV-2 viral copies this assay can detect is 138 copies/mL. A negative result does not preclude SARS-Cov-2 infection and should not be used as the sole basis for treatment or other patient management decisions. A negative result may occur with  improper specimen collection/handling, submission of specimen other than nasopharyngeal swab, presence of viral mutation(s) within the areas targeted by this assay, and inadequate number of viral copies(<138 copies/mL). A negative result must be combined with clinical observations, patient history, and epidemiological information. The expected result is Negative.  Fact Sheet for Patients:  BloggerCourse.com  Fact Sheet for Healthcare Providers:  SeriousBroker.it  This test is no t yet approved or cleared by the [place] FDA and  has been authorized for detection and/or diagnosis of SARS-CoV-2 by FDA under an Emergency Use Authorization (EUA). This EUA will remain  in effect (meaning this test can be used) for the duration of the COVID-19 declaration under Section 564(b)(1) of the Act, 21 U.S.C.section 360bbb-3(b)(1), unless the authorization is terminated  or revoked sooner.       Influenza A by PCR NEGATIVE NEGATIVE Final   Influenza B by PCR NEGATIVE NEGATIVE Final    Comment: (NOTE) The Xpert Xpress SARS-CoV-2/FLU/RSV plus assay is intended as an aid in the diagnosis of influenza from Nasopharyngeal swab specimens and should not be used as a sole basis for treatment. Nasal washings and aspirates are unacceptable for Xpert Xpress SARS-CoV-2/FLU/RSV testing.  Fact Sheet for Patients: BloggerCourse.com  Fact Sheet for Healthcare Providers: SeriousBroker.it  This test is not yet approved or cleared by the [place] FDA and has been authorized for detection and/or diagnosis of SARS-CoV-2 by FDA under an Emergency Use Authorization (EUA). This EUA will remain in effect (meaning this test can be used) for the duration of the COVID-19 declaration under Section 564(b)(1) of the Act, 21 U.S.C. section 360bbb-3(b)(1), unless the authorization is terminated or revoked.  Performed at [facility], [address], [place], [place] [address]   Urine Culture     Status: Abnormal   Collection Time: 09/19/20  7:30 PM   Specimen: Urine, Clean Catch  Result Value Ref Range Status   Specimen Description   Final  URINE, CLEAN CATCH Performed at [facility], [address], [place], [place] [address]    Special Requests   Final    NONE Performed at [facility], [address] [place], [place] [address]    Culture MULTIPLE SPECIES PRESENT, SUGGEST RECOLLECTION (A)  Final   Report Status 09/21/2020 FINAL  Final  C Difficile Quick Screen w PCR reflex     Status: None   Collection Time: 09/19/20 10:00 PM   Specimen: STOOL  Result Value Ref Range Status   C Diff antigen NEGATIVE NEGATIVE Final   C Diff toxin NEGATIVE NEGATIVE Final   C Diff interpretation No C. difficile detected.  Final    Comment: Performed at [facility], [address], [place], [place] [address]  Gastrointestinal Panel by PCR , Stool     Status: None   Collection Time: 09/19/20 10:00 PM   Specimen: STOOL  Result Value Ref Range Status   Campylobacter species NOT DETECTED NOT DETECTED Final   Plesimonas shigelloides NOT DETECTED NOT DETECTED Final   Salmonella species NOT DETECTED NOT DETECTED Final   Yersinia enterocolitica NOT DETECTED NOT DETECTED Final   Vibrio species NOT DETECTED NOT DETECTED Final   Vibrio cholerae NOT DETECTED NOT DETECTED Final   Enteroaggregative E coli (EAEC) NOT DETECTED NOT DETECTED Final   Enteropathogenic E coli  (EPEC) NOT DETECTED NOT DETECTED Final   Enterotoxigenic E coli (ETEC) NOT DETECTED NOT DETECTED Final   Shiga like toxin producing E coli (STEC) NOT DETECTED NOT DETECTED Final   Shigella/Enteroinvasive E coli (EIEC) NOT DETECTED NOT DETECTED Final   Cryptosporidium NOT DETECTED NOT DETECTED Final   Cyclospora cayetanensis NOT DETECTED NOT DETECTED Final   Entamoeba histolytica NOT DETECTED NOT DETECTED Final   Giardia lamblia NOT DETECTED NOT DETECTED Final   Adenovirus F40/41 NOT DETECTED NOT DETECTED Final   Astrovirus NOT DETECTED NOT DETECTED Final   Norovirus GI/GII NOT DETECTED NOT DETECTED Final   Rotavirus A NOT DETECTED NOT DETECTED Final   Sapovirus (I, II, IV, and V) NOT DETECTED NOT DETECTED Final    Comment: Performed at [facility], [address] [place], [place] [address]  Urine Culture     Status: Abnormal   Collection Time: 09/20/20 12:56 AM   Specimen: Urine, Clean Catch  Result Value Ref Range Status   Specimen Description   Final    URINE, CLEAN CATCH Performed at [facility], [address], [place], [place] [address]    Special Requests   Final    NONE Performed at [facility], [address] [place], [place] [address]    Culture (A)  Final    <10,000 COLONIES/mL INSIGNIFICANT GROWTH Performed at [facility], [address] [place], [place] [address]    Report Status 09/21/2020 FINAL  Final     Radiology Studies: DG Chest Port 1 View  Result Date: 09/19/2020 CLINICAL DATA:  Syncope EXAM: PORTABLE CHEST 1 VIEW COMPARISON:  None. FINDINGS: Widened mediastinum. No focal airspace disease. No pleural effusion or visible pneumothorax. No acute osseous abnormality. IMPRESSION: Widened mediastinum, recommend CTA of the chest to rule out acute aortic pathology. No focal airspace disease. Electronically Signed   By: Caprice Renshaw   On: 09/19/2020 19:13   ECHOCARDIOGRAM COMPLETE  Result Date: 09/20/2020     ECHOCARDIOGRAM REPORT   Patient Name:   RODNEY WIGGER Date of Exam: 09/20/2020 Medical Rec #:  [id number]  Height:       58.0 in Accession #:    [id number]         Weight:       90.8 lb Date of Birth:  1945/10/23          BSA:          1.302 m Patient Age:    74 years           BP:           117/92 mmHg Patient Gender: F                  HR:           71 bpm. Exam Location:  Inpatient Procedure: 2D Echo, Color Doppler and Cardiac Doppler Indications:    R55 Syncope  History:        Patient has no prior history of Echocardiogram examinations.                 Risk Factors:Hypertension.  Sonographer:    Irving Burton Senior RDCS Referring Phys: [phone] [name] GARDNER  Sonographer Comments: Technically difficult due to small body habitus IMPRESSIONS  1. Elevated velocity of 3.2 m/s in ascending aorta of uncertain etiology; suggest CTA of thoracic aorta to further assess.  2. Left ventricular ejection fraction, by estimation, is 60 to 65%. The left ventricle has normal function. The left ventricle has no regional wall motion abnormalities. There is mild left ventricular hypertrophy. Left ventricular diastolic parameters are consistent with Grade I diastolic dysfunction (impaired relaxation). Elevated left atrial pressure.  3. Right ventricular systolic function is normal. The right ventricular size is normal.  4. The mitral valve is normal in structure. No evidence of mitral valve regurgitation. No evidence of mitral stenosis.  5. The aortic valve has an indeterminant number of cusps. Aortic valve regurgitation is not visualized. No aortic stenosis is present.  6. There is mild dilatation of the aortic root, measuring 39 mm.  7. The inferior vena cava is normal in size with greater than 50% respiratory variability, suggesting right atrial pressure of 3 mmHg. FINDINGS  Left Ventricle: Left ventricular ejection fraction, by estimation, is 60 to 65%. The left ventricle has normal function. The left ventricle has no  regional wall motion abnormalities. The left ventricular internal cavity size was normal in size. There is  mild left ventricular hypertrophy. Left ventricular diastolic parameters are consistent with Grade I diastolic dysfunction (impaired relaxation). Elevated left atrial pressure. Right Ventricle: The right ventricular size is normal. Right ventricular systolic function is normal. Left Atrium: Left atrial size was normal in size. Right Atrium: Right atrial size was normal in size. Pericardium: Trivial pericardial effusion is present. Mitral Valve: The mitral valve is normal in structure. No evidence of mitral valve regurgitation. No evidence of mitral valve stenosis. Tricuspid Valve: The tricuspid valve is normal in structure. Tricuspid valve regurgitation is trivial. No evidence of tricuspid stenosis. Aortic Valve: The aortic valve has an indeterminant number of cusps. Aortic valve regurgitation is not visualized. No aortic stenosis is present. Pulmonic Valve: The pulmonic valve was not well visualized. Pulmonic valve regurgitation is not visualized. No evidence of pulmonic stenosis. Aorta: The aortic root is normal in size and structure. There is mild dilatation of the aortic root, measuring 39 mm. Venous: The inferior vena cava is normal in size with greater than 50% respiratory variability, suggesting right atrial pressure of 3 mmHg. IAS/Shunts: The interatrial septum was not well visualized. Additional Comments: Elevated  velocity of 3.2 m/s in ascending aorta of uncertain etiology; suggest CTA of thoracic aorta to further assess.  LEFT VENTRICLE PLAX 2D LVIDd:         2.30 cm  Diastology LVIDs:         1.50 cm  LV e' medial:    3.81 cm/s LV PW:         0.80 cm  LV E/e' medial:  15.1 LV IVS:        0.80 cm  LV e' lateral:   4.57 cm/s LVOT diam:     1.90 cm  LV E/e' lateral: 12.6 LV SV:         56 LV SV Index:   43 LVOT Area:     2.84 cm  RIGHT VENTRICLE RV S prime:     12.80 cm/s TAPSE (M-mode): 1.8 cm LEFT  ATRIUM           Index       RIGHT ATRIUM          Index LA diam:      2.20 cm 1.69 cm/m  RA Area:     9.84 cm LA Vol (A2C): 34.6 ml 26.57 ml/m RA Volume:   17.60 ml 13.52 ml/m LA Vol (A4C): 38.5 ml 29.57 ml/m  AORTIC VALVE LVOT Vmax:   90.70 cm/s LVOT Vmean:  55.900 cm/s LVOT VTI:    0.197 m  AORTA Ao Root diam: 3.90 cm MITRAL VALVE MV Area (PHT): 2.80 cm    SHUNTS MV Decel Time: 271 msec    Systemic VTI:  0.20 m MV E velocity: 57.40 cm/s  Systemic Diam: 1.90 cm MV A velocity: 95.10 cm/s MV E/A ratio:  0.60 Olga Millers MD Electronically signed by Olga Millers MD Signature Date/Time: 09/20/2020/1:51:38 PM    Final    CT Angio Chest/Abd/Pel for Dissection W and/or Wo Contrast  Result Date: 09/19/2020 CLINICAL DATA:  Abdominal pain. Aortic dissection suspected. Syncopal episode. Hypotension. Diffuse abdominal pain. EXAM: CT ANGIOGRAPHY CHEST, ABDOMEN AND PELVIS TECHNIQUE: Multidetector CT imaging through the chest, abdomen and pelvis was performed using the standard protocol during bolus administration of intravenous contrast. Multiplanar reconstructed images and MIPs were obtained and reviewed to evaluate the vascular anatomy. CONTRAST:  OMNIPAQUE IOHEXOL 350 MG/ML SOLN COMPARISON:  Chest radiography same day.  Abdominal MRI 09/11/2020. FINDINGS: CTA CHEST FINDINGS Cardiovascular: Heart size is mildly enlarged. There is coronary artery calcification. There is aortic atherosclerotic calcification. No aneurysm or dissection. The aorta is tortuous. Brachiocephalic vessel origins are patent. Pulmonary arterial tree as well opacified and no emboli are seen. Mediastinum/Nodes: No mediastinal or hilar mass or lymphadenopathy. Lungs/Pleura: No emphysema. Mild pulmonary scarring at the apices and lung bases. No pleural effusion. No consolidation or collapse. No mass. Musculoskeletal: Thoracic kyphotic curvature. No thoracic region fracture. Review of the MIP images confirms the above findings. CTA ABDOMEN  AND PELVIS FINDINGS VASCULAR Aorta: Aortic atherosclerosis.  No aneurysm.  No dissection. Celiac: No significant stenosis. SMA: No significant stenosis. Renals: Bilateral origin calcification with potential moderate stenosis on both sides. IMA: Patent Inflow: Normal Veins: No venous abnormality seen. Review of the MIP images confirms the above findings. NON-VASCULAR Hepatobiliary: No focal liver parenchymal finding. Mild chronic fullness of the ductal system as previously evaluated. No obstructing lesion seen. Pancreas: Mild fullness of the pancreatic duct as previously seen. No acute pancreatitis. Spleen: Normal Adrenals/Urinary Tract: Adrenal glands are normal. Kidneys show some atrophic change. No focal lesion. Bladder is unremarkable. Stomach/Bowel: No acute  or significant bowel finding. No evidence of obstruction. No evidence of enteritis. No evidence of diverticulitis. Lymphatic: No adenopathy. Reproductive: Previous hysterectomy.  No pelvic mass. Other: No free fluid or air. Musculoskeletal: Scoliosis and degenerative change of the spine. Question healed or healing sacral insufficiency fractures. Review of the MIP images confirms the above findings. IMPRESSION: No aortic dissection. Mild cardiomegaly. Coronary artery calcification. Aortic atherosclerotic calcification. Calcification at both renal artery origins. There could be flow-limiting renal artery stenosis. No acute abdominal organ pathology. Chronic prominence of the biliary tree and pancreatic duct, recently evaluated by MRI, without significant cause. Electronically Signed   By: Paulina Fusi M.D.   On: 09/19/2020 20:20     Scheduled Meds:  allopurinol  100 mg Oral Daily   cholestyramine light  4 g Oral BID   multivitamin with minerals  1 tablet Oral Daily   pantoprazole  40 mg Oral Daily   potassium chloride  40 mEq Oral Once   potassium chloride SA  40 mEq Oral Daily   predniSONE  10 mg Oral Q breakfast   sodium chloride flush  3 mL  Intravenous Q12H   sucralfate  1 g Oral TID   Continuous Infusions:  lactated ringers 75 mL/hr at 09/21/20 0211   potassium chloride 10 mEq (09/21/20 1115)     LOS: 1 day    Time spent: 25 mins    Jasdeep Dejarnett, MD [facility]   If 7PM-7AM, please contact night-coverage

## 2020-09-22 DIAGNOSIS — R55 Syncope and collapse: Secondary | ICD-10-CM | POA: Diagnosis not present

## 2020-09-22 LAB — BASIC METABOLIC PANEL
Anion gap: 5 (ref 5–15)
BUN: <5 mg/dL — ABNORMAL LOW (ref 8–23)
CO2: 22 mmol/L (ref 22–32)
Calcium: 8.1 mg/dL — ABNORMAL LOW (ref 8.9–10.3)
Chloride: 109 mmol/L (ref 98–111)
Creatinine, Ser: 0.72 mg/dL (ref 0.44–1.00)
GFR, Estimated: >60 mL/min (ref >60)
Glucose, Bld: 85 mg/dL (ref 70–99)
Potassium: 4.1 mmol/L (ref 3.5–5.1)
Sodium: 136 mmol/L (ref 135–145)

## 2020-09-22 LAB — PHOSPHORUS: Phosphorus: 2.1 mg/dL — ABNORMAL LOW (ref 2.5–4.6)

## 2020-09-22 LAB — MAGNESIUM: Magnesium: 2.1 mg/dL (ref 1.7–2.4)

## 2020-09-22 LAB — HEMOGLOBIN AND HEMATOCRIT, BLOOD
HCT: 29.8 % — ABNORMAL LOW (ref 36.0–46.0)
Hemoglobin: 9.7 g/dL — ABNORMAL LOW (ref 12.0–15.0)

## 2020-09-22 LAB — GLUCOSE, CAPILLARY: Glucose-Capillary: 63 mg/dL — ABNORMAL LOW (ref 70–99)

## 2020-09-22 MED ORDER — K PHOS MONO-SOD PHOS DI & MONO 155-852-130 MG PO TABS
250.0000 mg | ORAL_TABLET | Freq: Three times a day (TID) | ORAL | Status: DC
Start: 2020-09-22 — End: 2020-09-22
  Administered 2020-09-22: 250 mg via ORAL
  Filled 2020-09-22 (×3): qty 1

## 2020-09-22 MED ORDER — CHOLESTYRAMINE LIGHT 4 G PO PACK: 4.0000 g | PACK | Freq: Two times a day (BID) | ORAL | 0 refills | Status: DC

## 2020-09-22 NOTE — Evaluation (Signed)
Occupational Therapy Evaluation Patient Details Name: Wanda Valdez MRN: [id number] DOB: 1946/01/15 Today's Date: 09/22/2020    History of Present Illness 75 years old female admitted 09/19/20 for syncopal episode most likely secondary to dehydration from diarrhea. PMH significant for hypertension, PUD, ongoing smoking, chronic steroid use who was admitted in the hospital from 7/12-7/15 for 2 weeks history of dysphagia, diarrhea, decreased p.o. intake and 20 pound weight loss since February.  Work-up that admission including EGD and colonoscopy demonstrated mild colitis,  biopsies were taken.   Clinical Impression   Patient's youngest daughter lives with her, another daughter lives a block away. They both work but provide assist as needed, however at baseline patient is independent with self care "I like to cook." Patient demonstrates lower body dressing, functional ambulation and transfer on/off toilet as well as managing peri care all without any physical assistance, no loss of balance or safety concerns. No further acute OT needs at this time, will sign off. Please re-consult if new needs arise.    Follow Up Recommendations  No OT follow up    Equipment Recommendations  Tub/shower seat       Precautions / Restrictions Restrictions Weight Bearing Restrictions: No      Mobility Bed Mobility Overal bed mobility: Modified Independent                  Transfers Overall transfer level: Modified independent Equipment used: Rolling walker (2 wheeled)                  Balance Overall balance assessment: Mild deficits observed, not formally tested                                         ADL either performed or assessed with clinical judgement   ADL Overall ADL's : Modified independent                                       General ADL Comments: patient able to perform lower body dressing, toilet transfer, peri care and functional  ambulation using walker without any physical assistance. also able to take few steps without walker. had daughters that are very helpful/assist as needed         Hand Dominance  (did not specify)   Extremity/Trunk Assessment Upper Extremity Assessment Upper Extremity Assessment: Overall WFL for tasks assessed   Lower Extremity Assessment Lower Extremity Assessment: Defer to PT evaluation   Cervical / Trunk Assessment Cervical / Trunk Assessment: Normal   Communication Communication Communication: No difficulties   Cognition Arousal/Alertness: Awake/alert Behavior During Therapy: WFL for tasks assessed/performed Overall Cognitive Status: Within Functional Limits for tasks assessed                                                Home Living Family/patient expects to be discharged to:: Private residence Living Arrangements: Children Available Help at Discharge: Family Type of Home: House       Home Layout: One level     Bathroom Shower/Tub: Chief Strategy Officer: Standard     Home Equipment: Cane - single point  Prior Functioning/Environment Level of Independence: Independent                 OT Problem List: Decreased activity tolerance         OT Goals(Current goals can be found in the care plan section) Acute Rehab OT Goals Patient Stated Goal: home today OT Goal Formulation: All assessment and education complete, DC therapy                AM-PAC OT "6 Clicks" Daily Activity     Outcome Measure Help from another person eating meals?: None Help from another person taking care of personal grooming?: None Help from another person toileting, which includes using toliet, bedpan, or urinal?: None Help from another person bathing (including washing, rinsing, drying)?: None Help from another person to put on and taking off regular upper body clothing?: None Help from another person to put on and taking off  regular lower body clothing?: None 6 Click Score: 24   End of Session Equipment Utilized During Treatment: Rolling walker Nurse Communication: Mobility status  Activity Tolerance: Patient tolerated treatment well Patient left: in chair;with call bell/phone within reach  OT Visit Diagnosis: Other abnormalities of gait and mobility (R26.89);Unsteadiness on feet (R26.81)                Time: 1610-9604 OT Time Calculation (min): 15 min Charges:  OT General Charges $OT Visit: 1 Visit OT Evaluation $OT Eval Low Complexity: 1 Low  Marlyce Huge OT OT pager: [phone]  Carmelia Roller 09/22/2020, 9:47 AM

## 2020-09-22 NOTE — Progress Notes (Signed)
Patient remains alert, oriented (x4), ambulatory. Discharge instructions reviewed. Questions, concerns were denied at this time.

## 2020-09-22 NOTE — Discharge Instructions (Signed)
Advised to follow-up with primary care physician in 1 week. Advised to hold Coreg and resume if her heart rate and blood pressure improves. Advised to follow-up with GI as scheduled.

## 2020-09-22 NOTE — Discharge Summary (Signed)
Physician Discharge Summary  Wanda Valdez HFW:[id number] DOB: 09-May-1945 DOA: 09/19/2020  PCP: Doreen Salvage, PA-C  Admit date: 09/19/2020  Discharge date: 09/22/2020  Admitted From: Home.  Disposition: Home with  Home services.  Recommendations for Outpatient Follow-up:  Follow up with PCP in 1-2 weeks. Please obtain BMP/CBC in one week. Advised to hold Coreg and resume if her heart rate and blood pressure improves. Advised to follow-up with GI as scheduled.  Home Health: Home PT Equipment / Devices:None  Discharge Condition: Good CODE STATUS:Full code Diet recommendation: Heart Healthy  Brief Summary / Hospital Course: This 75 years old female with PMH significant for hypertension, PUD, ongoing smoking, chronic steroid use who was admitted in the hospital from 7/12-7/15 for 2 weeks history of dysphagia, diarrhea, decreased p.o. intake and 20 pound weight loss since February.  Work-up that admission including EGD and colonoscopy demonstrated mild colitis,  biopsies were taken.  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  Now patient presented in the ED after having a syncopal episode at home.  Patient was feeling very weak on day of admission, she was sitting outside reportedly had a syncopal episode,  daughter reports providing chest compression while EMS was arrived though she has pulses on EMS arrival.  Patient noted to be hypotensive with a systolic BP in 70s on EMS arrival.  Blood pressure has improved after she was given IV fluid boluses.  UA was contaminated by stool. Patient was admitted for syncopal episode most likely secondary to dehydration from diarrhea.  Patient reports having chronic diarrhea, stool for C. difficile and GI panel negative.  Patient was started on loperamide.  GI was consulted recommended to continue supportive care,  loperamide for diarrhea,  there is no need to start budesonide at this time.  No GI work-up is needed.  Patient feels better and can be discharged.  Echocardiogram normal LVEF, no regional wall motion abnormalities.  Patient has ambulated in the hallway with physical therapy recommended home with home PT.  Patient is being discharged home  She was managed for below problems.   Discharge Diagnoses:  Principal Problem:   Syncope Active Problems:   Dysphagia   Diarrhea   Dehydration   Lymphocytic colitis   Protein-calorie malnutrition, severe   Current chronic use of systemic steroids  Syncope: Patient presented with hypotension and syncopal episode.  Most likely secondary to dehydration in the setting of chronic diarrhea from incompletely treated lymphocytic colitis,  poor p.o. intake,  Lasix use,  dehydration from being outside.  Blood pressure improved with IV fluids. 2D echocardiogram shows LVEF 60 to 65%, no regional wall motion abnormalities. Continue gentle IV hydration.   Lymphocytic colitis: It seems like it was incompletely treated with prednisone. Patient presented with ongoing chronic diarrhea, decreased p.o. intake Continue loperamide as needed. GI consulted recommended continue supportive care, Imodium. Since patient has improvement in symptoms there is no need for budesonide as per GI. GI pathogen panel is negative, C. difficile is negative, continue cholestyramine. Diarrhea has improved.  Patient had EGD and colonoscopy on last admission.  Findings consistent with early lymphocytic colitis. GI signed off, recommended outpatient GI follow-up.   Chronic steroid use: Patient had been on prednisone before hospital admission last week and since a diagnosis of lymphocytic colitis. Continue prednisone 10 mg p.o. daily   HTN: Blood pressure medications were on hold,  will resume as blood pressure has improved. Advised to resume Coreg if blood pressure and heart rate improves.  Anemia: Hemoglobin is stable. Hb > 9.2 > 9.4 Could be due to colitis.   Hypokalemia and  hypomagnesemia: Replaced and normalized.  Discharge Instructions  Discharge Instructions     Call MD for:  difficulty breathing, headache or visual disturbances   Complete by: As directed    Call MD for:  persistant dizziness or light-headedness   Complete by: As directed    Call MD for:  persistant nausea and vomiting   Complete by: As directed    Diet - low sodium heart healthy   Complete by: As directed    Diet Carb Modified   Complete by: As directed    Discharge instructions   Complete by: As directed    Advised to follow-up with primary care physician in 1 week. Advised to hold Coreg and resume if her heart rate and blood pressure improves. Advised to follow-up with GI as scheduled.   Increase activity slowly   Complete by: As directed       Allergies as of 09/22/2020   No Known Allergies      Medication List     STOP taking these medications    carvedilol 6.25 MG tablet Commonly known as: COREG       TAKE these medications    alendronate 35 MG tablet Commonly known as: FOSAMAX Take 35 mg by mouth once a week.   allopurinol 100 MG tablet Commonly known as: ZYLOPRIM Take 100 mg by mouth daily.   cholestyramine light 4 g packet Commonly known as: PREVALITE Take 1 packet (4 g total) by mouth 2 (two) times daily.   Creon 6000-19000 units Cpep Generic drug: Pancrelipase (Lip-Prot-Amyl) Take 2 capsules by mouth 3 (three) times daily.   furosemide 20 MG tablet Commonly known as: LASIX Take 20 mg by mouth daily.   multivitamin with minerals Tabs tablet Take 1 tablet by mouth daily.   pantoprazole 40 MG tablet Commonly known as: PROTONIX Take 1 tablet (40 mg total) by mouth daily.   potassium chloride SA 20 MEQ tablet Commonly known as: KLOR-CON Take 20 mEq by mouth daily.   predniSONE 10 MG tablet Commonly known as: DELTASONE Take 10 mg by mouth daily with breakfast.   sucralfate 1 g tablet Commonly known as: CARAFATE Take 1 g by mouth 3  (three) times daily. Dissolve 1 tablet in 2 ounces of water               Durable Medical Equipment  (From admission, onward)           Start     Ordered   09/22/20 1119  For home use only DME Walker rolling  Once       Question Answer Comment  Walker: With 5 Inch Wheels   Patient needs a walker to treat with the following condition Generalized weakness      09/22/20 1119            Follow-up Information     Bulla, Donald, PA-C Follow up in 1 week(s).   Specialty: Internal Medicine Contact information: [address] [place] [address] [phone]         Kathi Der, MD Follow up in 1 week(s).   Specialty: Gastroenterology Contact information: [address] [place] [address] [phone]                No Known Allergies  Consultations: GI   Procedures/Studies: X-ray chest PA and lateral  Result Date: 09/10/2020 CLINICAL DATA:  75 year old female with shortness of breath. EXAM: CHEST - 2 VIEW COMPARISON:  None FINDINGS: No focal consolidation, pleural effusion or pneumothorax. Background of emphysema. The cardiac silhouette is within limits. Atherosclerotic calcification of the aorta. The aorta is tortuous. Osteopenia with degenerative changes of the spine. No acute osseous pathology. IMPRESSION: No active cardiopulmonary disease. Electronically Signed   By: Elgie Collard M.D.   On: 09/10/2020 20:19   DG Ribs Unilateral Right  Result Date: 09/10/2020 CLINICAL DATA:  Shortness of breath and right rib pain, initial encounter EXAM: RIGHT RIBS - 2 VIEW COMPARISON:  None. FINDINGS: Areas of sclerosis are noted in the anterior aspect of the right fourth, fifth and seventh ribs consistent with prior fractures and healing. No acute fractures are noted. No soft tissue abnormality is seen. The lungs are clear. No pneumothorax is noted. IMPRESSION: Old healing rib fractures on the right. No acute abnormality is noted.  Electronically Signed   By: Alcide Clever M.D.   On: 09/10/2020 20:19   CT Abdomen Pelvis W Contrast  Result Date: 09/10/2020 CLINICAL DATA:  Decreased appetite with diarrhea, abdominal pain and decreased urine output for 3 weeks. Bowel obstruction suspected. Patient fell 6 months ago with continued back pain. Mildly elevated lipase and transaminase levels with normal total bilirubin and alkaline phosphatase. EXAM: CT ABDOMEN AND PELVIS WITH CONTRAST TECHNIQUE: Multidetector CT imaging of the abdomen and pelvis was performed using the standard protocol following bolus administration of intravenous contrast. CONTRAST:  75mL OMNIPAQUE IOHEXOL 300 MG/ML  SOLN COMPARISON:  None available. Limited correlation made with the reports from a chest CTA 01/28/2014 and abdominal CT 10/11/2012. FINDINGS: Lower chest: Clear lung bases. No significant pleural or pericardial effusion. There is atherosclerosis of the aorta and coronary arteries. Hepatobiliary: The liver is normal in density without suspicious focal abnormality. Moderate intra and extrahepatic biliary dilatation status post cholecystectomy. The common hepatic duct measures up to 1.6 cm in diameter and tapers distally. No evidence of choledocholithiasis. Pancreas: The pancreatic duct is dilated to 6 mm in the pancreatic head. No pancreatic or ampullary mass identified. The pancreas otherwise appears unremarkable, without surrounding inflammation. Spleen: Normal in size without focal abnormality. Adrenals/Urinary Tract: Both adrenal glands appear normal. Both kidneys demonstrate cortical scarring and scattered small cysts. No evidence of urinary tract calculus or hydronephrosis. The bladder appears unremarkable for its degree of distention. Stomach/Bowel: Enteric contrast was administered and has passed into the rectum. No evidence of bowel obstruction. The stomach appears unremarkable for its degree of distention. No small bowel wall thickening, distention or  surrounding inflammation. There is possible wall thickening in the ascending colon. No focal mass lesion identified. There is moderate stool in the rectum. Vascular/Lymphatic: There are no enlarged abdominal or pelvic lymph nodes. Diffuse aortic and branch vessel atherosclerosis without aneurysm or evidence of large vessel occlusion. The portal, superior mesenteric and splenic veins are patent. Reproductive: Hysterectomy. Probable residual ovarian tissue bilaterally. No suspicious adnexal findings. Other: Extensive postsurgical changes in the anterior abdominal wall consistent with previous hernia repair. No evidence of recurrent hernia. No ascites, free air or focal extraluminal fluid collection. Musculoskeletal: Severe convex right thoracolumbar scoliosis with associated spondylosis. There is irregular sclerosis involving the sacral ala bilaterally, suspicious for bilateral sacral insufficiency fractures. IMPRESSION: 1. No evidence of bowel obstruction. 2. Possible wall thickening in the right colon which could represent mild colitis. No surrounding inflammatory changes identified. 3. Moderate intrahepatic and extrahepatic biliary dilatation with mild pancreatic ductal dilatation. Findings could be physiologic given  the laboratory absence of biliary obstruction. Cannot exclude small ampullary or distal common bile duct lesion. 4. Bilateral renal cortical thinning.  No hydronephrosis. 5. Suspected bilateral sacral insufficiency fractures. Lumbar scoliosis and spondylosis without acute lumbar spine osseous findings. 6. Extensive Aortic Atherosclerosis (ICD10-I70.0). Electronically Signed   By: Carey Bullocks M.D.   On: 09/10/2020 13:11   MR 3D Recon At Scanner  Result Date: 09/11/2020 CLINICAL DATA:  Biliary dilatation on CT. Decreased appetite with diarrhea, abdominal pain and decreased urine output for 3 weeks. EXAM: MRI ABDOMEN WITHOUT AND WITH CONTRAST (INCLUDING MRCP) TECHNIQUE: Multiplanar multisequence  MR imaging of the abdomen was performed both before and after the administration of intravenous contrast. Heavily T2-weighted images of the biliary and pancreatic ducts were obtained, and three-dimensional MRCP images were rendered by post processing. CONTRAST:  4mL GADAVIST GADOBUTROL 1 MMOL/ML IV SOLN COMPARISON:  Abdominopelvic CT 09/10/2020 FINDINGS: Lower chest:  The visualized lower chest appears unremarkable. Hepatobiliary: Mild hepatic steatosis with loss of signal on the gradient echo opposed phase images. No focal hepatic lesions or abnormal enhancement identified. Status post cholecystectomy. Again demonstrated is moderate intra and extrahepatic biliary dilatation. The common hepatic duct measures up to 1.4 cm in diameter. No evidence of choledocholithiasis. Pancreas: Mild dilatation of the main pancreatic duct to 6 mm. There is a small cystic lesion in the uncinate process of the pancreas, measuring 1.3 x 0.6 cm on image 14/3. No evidence of enhancing mass or surrounding inflammation. Spleen: Normal in size without focal abnormality. Adrenals/Urinary Tract: Both adrenal glands appear normal. Bilateral renal cortical scarring and cysts. No hydronephrosis or enhancing mass identified. Stomach/Bowel: No evidence of ampullary mass. Mild wall thickening of the ascending colon again noted, similar to recent CT. The rectum appears mildly distended with liquid stool on the coronal images. No evidence of bowel obstruction. Vascular/Lymphatic: There are no enlarged abdominal lymph nodes. Diffuse aortic and branch vessel atherosclerosis. Other: There are postsurgical changes in the anterior abdominal wall consistent with previous hernia repair. There is associated susceptibility artifact. There is additional artifact within the posterior right chest wall. Musculoskeletal: Convex right thoracolumbar scoliosis with associated spondylosis. Bilateral sacral insufficiency fractures are again noted as seen on CT. These  do not appear to be associated with much marrow edema and could be subacute, although are incompletely evaluated by this abdominal study. IMPRESSION: 1. Stable intra and extrahepatic biliary dilatation with mild pancreatic ductal dilatation. No evidence of choledocholithiasis or other clear explanation. Continued follow-up of the patient's liver function studies recommended, currently not suggesting biliary obstruction. 2. Small cystic lesion in the uncinate process of the pancreas without aggressive characteristics. Recommend follow-up imaging in 2 years per consensus guidelines. This recommendation follows ACR consensus guidelines: Management of Incidental Pancreatic Cysts: A White Paper of the ACR Incidental Findings Committee. J Am Coll Radiol 2017;14:911-923. 3. Persistent mild wall thickening of the right colon which could reflect mild colitis. 4. Stable additional incidental findings including renal cortical scarring, aortic atherosclerosis and probable bilateral sacral insufficiency fractures. Electronically Signed   By: Carey Bullocks M.D.   On: 09/11/2020 18:09   DG Chest Port 1 View  Result Date: 09/19/2020 CLINICAL DATA:  Syncope EXAM: PORTABLE CHEST 1 VIEW COMPARISON:  None. FINDINGS: Widened mediastinum. No focal airspace disease. No pleural effusion or visible pneumothorax. No acute osseous abnormality. IMPRESSION: Widened mediastinum, recommend CTA of the chest to rule out acute aortic pathology. No focal airspace disease. Electronically Signed   By: Caprice Renshaw   On: 09/19/2020  19:13   MR ABDOMEN MRCP W WO CONTAST  Result Date: 09/11/2020 CLINICAL DATA:  Biliary dilatation on CT. Decreased appetite with diarrhea, abdominal pain and decreased urine output for 3 weeks. EXAM: MRI ABDOMEN WITHOUT AND WITH CONTRAST (INCLUDING MRCP) TECHNIQUE: Multiplanar multisequence MR imaging of the abdomen was performed both before and after the administration of intravenous contrast. Heavily T2-weighted  images of the biliary and pancreatic ducts were obtained, and three-dimensional MRCP images were rendered by post processing. CONTRAST:  4mL GADAVIST GADOBUTROL 1 MMOL/ML IV SOLN COMPARISON:  Abdominopelvic CT 09/10/2020 FINDINGS: Lower chest:  The visualized lower chest appears unremarkable. Hepatobiliary: Mild hepatic steatosis with loss of signal on the gradient echo opposed phase images. No focal hepatic lesions or abnormal enhancement identified. Status post cholecystectomy. Again demonstrated is moderate intra and extrahepatic biliary dilatation. The common hepatic duct measures up to 1.4 cm in diameter. No evidence of choledocholithiasis. Pancreas: Mild dilatation of the main pancreatic duct to 6 mm. There is a small cystic lesion in the uncinate process of the pancreas, measuring 1.3 x 0.6 cm on image 14/3. No evidence of enhancing mass or surrounding inflammation. Spleen: Normal in size without focal abnormality. Adrenals/Urinary Tract: Both adrenal glands appear normal. Bilateral renal cortical scarring and cysts. No hydronephrosis or enhancing mass identified. Stomach/Bowel: No evidence of ampullary mass. Mild wall thickening of the ascending colon again noted, similar to recent CT. The rectum appears mildly distended with liquid stool on the coronal images. No evidence of bowel obstruction. Vascular/Lymphatic: There are no enlarged abdominal lymph nodes. Diffuse aortic and branch vessel atherosclerosis. Other: There are postsurgical changes in the anterior abdominal wall consistent with previous hernia repair. There is associated susceptibility artifact. There is additional artifact within the posterior right chest wall. Musculoskeletal: Convex right thoracolumbar scoliosis with associated spondylosis. Bilateral sacral insufficiency fractures are again noted as seen on CT. These do not appear to be associated with much marrow edema and could be subacute, although are incompletely evaluated by this  abdominal study. IMPRESSION: 1. Stable intra and extrahepatic biliary dilatation with mild pancreatic ductal dilatation. No evidence of choledocholithiasis or other clear explanation. Continued follow-up of the patient's liver function studies recommended, currently not suggesting biliary obstruction. 2. Small cystic lesion in the uncinate process of the pancreas without aggressive characteristics. Recommend follow-up imaging in 2 years per consensus guidelines. This recommendation follows ACR consensus guidelines: Management of Incidental Pancreatic Cysts: A White Paper of the ACR Incidental Findings Committee. J Am Coll Radiol 2017;14:911-923. 3. Persistent mild wall thickening of the right colon which could reflect mild colitis. 4. Stable additional incidental findings including renal cortical scarring, aortic atherosclerosis and probable bilateral sacral insufficiency fractures. Electronically Signed   By: Carey Bullocks M.D.   On: 09/11/2020 18:09   ECHOCARDIOGRAM COMPLETE  Result Date: 09/20/2020    ECHOCARDIOGRAM REPORT   Patient Name:   Wanda Valdez Date of Exam: 09/20/2020 Medical Rec #:  [id number]          Height:       58.0 in Accession #:    [id number]         Weight:       90.8 lb Date of Birth:  05-Nov-1945          BSA:          1.302 m Patient Age:    74 years           BP:           117/92 mmHg  Patient Gender: F                  HR:           71 bpm. Exam Location:  Inpatient Procedure: 2D Echo, Color Doppler and Cardiac Doppler Indications:    R55 Syncope  History:        Patient has no prior history of Echocardiogram examinations.                 Risk Factors:Hypertension.  Sonographer:    Irving Burton Senior RDCS Referring Phys: [phone] [name] GARDNER  Sonographer Comments: Technically difficult due to small body habitus IMPRESSIONS  1. Elevated velocity of 3.2 m/s in ascending aorta of uncertain etiology; suggest CTA of thoracic aorta to further assess.  2. Left ventricular ejection fraction, by  estimation, is 60 to 65%. The left ventricle has normal function. The left ventricle has no regional wall motion abnormalities. There is mild left ventricular hypertrophy. Left ventricular diastolic parameters are consistent with Grade I diastolic dysfunction (impaired relaxation). Elevated left atrial pressure.  3. Right ventricular systolic function is normal. The right ventricular size is normal.  4. The mitral valve is normal in structure. No evidence of mitral valve regurgitation. No evidence of mitral stenosis.  5. The aortic valve has an indeterminant number of cusps. Aortic valve regurgitation is not visualized. No aortic stenosis is present.  6. There is mild dilatation of the aortic root, measuring 39 mm.  7. The inferior vena cava is normal in size with greater than 50% respiratory variability, suggesting right atrial pressure of 3 mmHg. FINDINGS  Left Ventricle: Left ventricular ejection fraction, by estimation, is 60 to 65%. The left ventricle has normal function. The left ventricle has no regional wall motion abnormalities. The left ventricular internal cavity size was normal in size. There is  mild left ventricular hypertrophy. Left ventricular diastolic parameters are consistent with Grade I diastolic dysfunction (impaired relaxation). Elevated left atrial pressure. Right Ventricle: The right ventricular size is normal. Right ventricular systolic function is normal. Left Atrium: Left atrial size was normal in size. Right Atrium: Right atrial size was normal in size. Pericardium: Trivial pericardial effusion is present. Mitral Valve: The mitral valve is normal in structure. No evidence of mitral valve regurgitation. No evidence of mitral valve stenosis. Tricuspid Valve: The tricuspid valve is normal in structure. Tricuspid valve regurgitation is trivial. No evidence of tricuspid stenosis. Aortic Valve: The aortic valve has an indeterminant number of cusps. Aortic valve regurgitation is not visualized.  No aortic stenosis is present. Pulmonic Valve: The pulmonic valve was not well visualized. Pulmonic valve regurgitation is not visualized. No evidence of pulmonic stenosis. Aorta: The aortic root is normal in size and structure. There is mild dilatation of the aortic root, measuring 39 mm. Venous: The inferior vena cava is normal in size with greater than 50% respiratory variability, suggesting right atrial pressure of 3 mmHg. IAS/Shunts: The interatrial septum was not well visualized. Additional Comments: Elevated velocity of 3.2 m/s in ascending aorta of uncertain etiology; suggest CTA of thoracic aorta to further assess.  LEFT VENTRICLE PLAX 2D LVIDd:         2.30 cm  Diastology LVIDs:         1.50 cm  LV e' medial:    3.81 cm/s LV PW:         0.80 cm  LV E/e' medial:  15.1 LV IVS:        0.80 cm  LV  e' lateral:   4.57 cm/s LVOT diam:     1.90 cm  LV E/e' lateral: 12.6 LV SV:         56 LV SV Index:   43 LVOT Area:     2.84 cm  RIGHT VENTRICLE RV S prime:     12.80 cm/s TAPSE (M-mode): 1.8 cm LEFT ATRIUM           Index       RIGHT ATRIUM          Index LA diam:      2.20 cm 1.69 cm/m  RA Area:     9.84 cm LA Vol (A2C): 34.6 ml 26.57 ml/m RA Volume:   17.60 ml 13.52 ml/m LA Vol (A4C): 38.5 ml 29.57 ml/m  AORTIC VALVE LVOT Vmax:   90.70 cm/s LVOT Vmean:  55.900 cm/s LVOT VTI:    0.197 m  AORTA Ao Root diam: 3.90 cm MITRAL VALVE MV Area (PHT): 2.80 cm    SHUNTS MV Decel Time: 271 msec    Systemic VTI:  0.20 m MV E velocity: 57.40 cm/s  Systemic Diam: 1.90 cm MV A velocity: 95.10 cm/s MV E/A ratio:  0.60 Olga Millers MD Electronically signed by Olga Millers MD Signature Date/Time: 09/20/2020/1:51:38 PM    Final    CT Angio Chest/Abd/Pel for Dissection W and/or Wo Contrast  Result Date: 09/19/2020 CLINICAL DATA:  Abdominal pain. Aortic dissection suspected. Syncopal episode. Hypotension. Diffuse abdominal pain. EXAM: CT ANGIOGRAPHY CHEST, ABDOMEN AND PELVIS TECHNIQUE: Multidetector CT imaging through  the chest, abdomen and pelvis was performed using the standard protocol during bolus administration of intravenous contrast. Multiplanar reconstructed images and MIPs were obtained and reviewed to evaluate the vascular anatomy. CONTRAST:  OMNIPAQUE IOHEXOL 350 MG/ML SOLN COMPARISON:  Chest radiography same day.  Abdominal MRI 09/11/2020. FINDINGS: CTA CHEST FINDINGS Cardiovascular: Heart size is mildly enlarged. There is coronary artery calcification. There is aortic atherosclerotic calcification. No aneurysm or dissection. The aorta is tortuous. Brachiocephalic vessel origins are patent. Pulmonary arterial tree as well opacified and no emboli are seen. Mediastinum/Nodes: No mediastinal or hilar mass or lymphadenopathy. Lungs/Pleura: No emphysema. Mild pulmonary scarring at the apices and lung bases. No pleural effusion. No consolidation or collapse. No mass. Musculoskeletal: Thoracic kyphotic curvature. No thoracic region fracture. Review of the MIP images confirms the above findings. CTA ABDOMEN AND PELVIS FINDINGS VASCULAR Aorta: Aortic atherosclerosis.  No aneurysm.  No dissection. Celiac: No significant stenosis. SMA: No significant stenosis. Renals: Bilateral origin calcification with potential moderate stenosis on both sides. IMA: Patent Inflow: Normal Veins: No venous abnormality seen. Review of the MIP images confirms the above findings. NON-VASCULAR Hepatobiliary: No focal liver parenchymal finding. Mild chronic fullness of the ductal system as previously evaluated. No obstructing lesion seen. Pancreas: Mild fullness of the pancreatic duct as previously seen. No acute pancreatitis. Spleen: Normal Adrenals/Urinary Tract: Adrenal glands are normal. Kidneys show some atrophic change. No focal lesion. Bladder is unremarkable. Stomach/Bowel: No acute or significant bowel finding. No evidence of obstruction. No evidence of enteritis. No evidence of diverticulitis. Lymphatic: No adenopathy. Reproductive:  Previous hysterectomy.  No pelvic mass. Other: No free fluid or air. Musculoskeletal: Scoliosis and degenerative change of the spine. Question healed or healing sacral insufficiency fractures. Review of the MIP images confirms the above findings. IMPRESSION: No aortic dissection. Mild cardiomegaly. Coronary artery calcification. Aortic atherosclerotic calcification. Calcification at both renal artery origins. There could be flow-limiting renal artery stenosis. No acute abdominal organ pathology. 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 Electronically Signed   By: Paulina FusiMark  Shogry M.D.   On: 09/19/2020 20:20       Subjective: Patient was seen and examined at bedside.  Overnight events noted.  Patient reports feeling much improved.  Denies any more dizziness, has ambulated with a walker in the hallway.  Patient is being discharged home and wants to be discharged  Discharge Exam: Vitals:   09/22/20 0842 09/22/20 1027  BP: 118/80 116/82  Pulse: (!) 58 (!) 59  Resp: 18   Temp: 98 F (36.7 C)   SpO2: 93%    Vitals:   09/21/20 1956 09/22/20 0523 09/22/20 0842 09/22/20 1027  BP: 114/72 136/79 118/80 116/82  Pulse: 71 (!) 58 (!) 58 (!) 59  Resp: 18 16 18    Temp: 98.2 F (36.8 C) 98.2 F (36.8 C) 98 F (36.7 C)   TempSrc: Oral Oral    SpO2: 100% 100% 93%   Weight:      Height:        General: Pt is alert, awake, not in acute distress Cardiovascular: RRR, S1/S2 +, no rubs, no gallops Respiratory: CTA bilaterally, no wheezing, no rhonchi Abdominal: Soft, NT, ND, bowel sounds + Extremities: no edema, no cyanosis    The results of significant diagnostics from this hospitalization (including imaging, microbiology, ancillary and laboratory) are listed below for reference.     Microbiology: Recent Results (from the past 240 hour(s))  Blood culture (routine x 2)     Status: None (Preliminary result)   Collection Time:  09/19/20  6:14 PM   Specimen: BLOOD  Result Value Ref Range Status   Specimen Description   Final    BLOOD BLOOD LEFT FOREARM Performed at [facility], [address] [place], [place] [address]    Special Requests   Final    BOTTLES DRAWN AEROBIC AND ANAEROBIC Blood Culture adequate volume Performed at [facility], [address] [place], [place] [address]    Culture   Final    NO GROWTH 3 DAYS Performed at [facility], [address] [place], [place] [address]    Report Status PENDING  Incomplete  Resp Panel by RT-PCR (Flu A&B, Covid) Nasopharyngeal Swab     Status: None   Collection Time: 09/19/20  6:15 PM   Specimen: Nasopharyngeal Swab; Nasopharyngeal(NP) swabs in vial transport medium  Result Value Ref Range Status   SARS Coronavirus 2 by RT PCR NEGATIVE NEGATIVE Final    Comment: (NOTE) SARS-CoV-2 target nucleic acids are NOT DETECTED.  The SARS-CoV-2 RNA is generally detectable in upper respiratory specimens during the acute phase of infection. The lowest concentration of SARS-CoV-2 viral copies this assay can detect is 138 copies/mL. A negative result does not preclude SARS-Cov-2 infection and should not be used as the sole basis for treatment or other patient management decisions. A negative result may occur with  improper specimen collection/handling, submission of specimen other than nasopharyngeal swab, presence of viral mutation(s) within the areas targeted by this assay, and inadequate number of viral copies(<138 copies/mL). A negative result must be combined with clinical observations, patient history, and epidemiological information. The expected result is Negative.  Fact Sheet for Patients:  BloggerCourse.comhttps://www.fda.gov/media/152166/download  Fact Sheet for Healthcare Providers:  SeriousBroker.ithttps://www.fda.gov/media/152162/download  This test is no t yet approved or cleared by the [place] FDA and  has been authorized for  detection and/or diagnosis of  SARS-CoV-2 by FDA under an Emergency Use Authorization (EUA). This EUA will remain  in effect (meaning this test can be used) for the duration of the COVID-19 declaration under Section 564(b)(1) of the Act, 21 U.S.C.section 360bbb-3(b)(1), unless the authorization is terminated  or revoked sooner.       Influenza A by PCR NEGATIVE NEGATIVE Final   Influenza B by PCR NEGATIVE NEGATIVE Final    Comment: (NOTE) The Xpert Xpress SARS-CoV-2/FLU/RSV plus assay is intended as an aid in the diagnosis of influenza from Nasopharyngeal swab specimens and should not be used as a sole basis for treatment. Nasal washings and aspirates are unacceptable for Xpert Xpress SARS-CoV-2/FLU/RSV testing.  Fact Sheet for Patients: BloggerCourse.com  Fact Sheet for Healthcare Providers: SeriousBroker.it  This test is not yet approved or cleared by the [place] FDA and has been authorized for detection and/or diagnosis of SARS-CoV-2 by FDA under an Emergency Use Authorization (EUA). This EUA will remain in effect (meaning this test can be used) for the duration of the COVID-19 declaration under Section 564(b)(1) of the Act, 21 U.S.C. section 360bbb-3(b)(1), unless the authorization is terminated or revoked.  Performed at [facility], [address], [place], [place] [address]   Urine Culture     Status: Abnormal   Collection Time: 09/19/20  7:30 PM   Specimen: Urine, Clean Catch  Result Value Ref Range Status   Specimen Description   Final    URINE, CLEAN CATCH Performed at [facility], [address] [place], [place] [address]    Special Requests   Final    NONE Performed at [facility], [address], [place], [place] [address]    Culture MULTIPLE SPECIES PRESENT, SUGGEST RECOLLECTION (A)  Final   Report Status 09/21/2020 FINAL  Final  C Difficile  Quick Screen w PCR reflex     Status: None   Collection Time: 09/19/20 10:00 PM   Specimen: STOOL  Result Value Ref Range Status   C Diff antigen NEGATIVE NEGATIVE Final   C Diff toxin NEGATIVE NEGATIVE Final   C Diff interpretation No C. difficile detected.  Final    Comment: Performed at [facility], [address], [place], [place] [address]  Gastrointestinal Panel by PCR , Stool     Status: None   Collection Time: 09/19/20 10:00 PM   Specimen: STOOL  Result Value Ref Range Status   Campylobacter species NOT DETECTED NOT DETECTED Final   Plesimonas shigelloides NOT DETECTED NOT DETECTED Final   Salmonella species NOT DETECTED NOT DETECTED Final   Yersinia enterocolitica NOT DETECTED NOT DETECTED Final   Vibrio species NOT DETECTED NOT DETECTED Final   Vibrio cholerae NOT DETECTED NOT DETECTED Final   Enteroaggregative E coli (EAEC) NOT DETECTED NOT DETECTED Final   Enteropathogenic E coli (EPEC) NOT DETECTED NOT DETECTED Final   Enterotoxigenic E coli (ETEC) NOT DETECTED NOT DETECTED Final   Shiga like toxin producing E coli (STEC) NOT DETECTED NOT DETECTED Final   Shigella/Enteroinvasive E coli (EIEC) NOT DETECTED NOT DETECTED Final   Cryptosporidium NOT DETECTED NOT DETECTED Final   Cyclospora cayetanensis NOT DETECTED NOT DETECTED Final   Entamoeba histolytica NOT DETECTED NOT DETECTED Final   Giardia lamblia NOT DETECTED NOT DETECTED Final   Adenovirus F40/41 NOT DETECTED NOT DETECTED Final   Astrovirus NOT DETECTED NOT DETECTED Final   Norovirus GI/GII NOT DETECTED NOT DETECTED Final   Rotavirus A NOT DETECTED NOT DETECTED Final  Sapovirus (I, II, IV, and V) NOT DETECTED NOT DETECTED Final    Comment: Performed at [facility], [address] [place], [place] [address]  Urine Culture     Status: Abnormal   Collection Time: 09/20/20 12:56 AM   Specimen: Urine, Clean Catch  Result Value Ref Range Status   Specimen Description   Final     URINE, CLEAN CATCH Performed at [facility], [address] [place], [place] [address]    Special Requests   Final    NONE Performed at [facility], [address], [place], [place] [address]    Culture (A)  Final    <10,000 COLONIES/mL INSIGNIFICANT GROWTH Performed at [facility], [address], [place], [place] [address]    Report Status 09/21/2020 FINAL  Final     Labs: BNP (last 3 results) No results for input(s): BNP in the last 8760 hours. Basic Metabolic Panel: Recent Labs  Lab 09/19/20 1814 09/19/20 1844 09/21/20 0420 09/22/20 0549  NA 142 142 140 136  K 3.1* 3.7 2.6* 4.1  CL 113* 113* 109 109  CO2 20*  --  23 22  GLUCOSE 94 83 84 85  BUN 7* 5* 6* <5*  CREATININE 0.89 0.90 0.82 0.72  CALCIUM 8.2*  --  8.0* 8.1*  MG  --   --  1.3* 2.1  PHOS  --   --  2.7 2.1*   Liver Function Tests: Recent Labs  Lab 09/19/20 1814  AST 31  ALT 29  ALKPHOS 79  BILITOT 0.3  PROT 4.6*  ALBUMIN 2.3*   No results for input(s): LIPASE, AMYLASE in the last 168 hours. No results for input(s): AMMONIA in the last 168 hours. CBC: Recent Labs  Lab 09/19/20 1814 09/19/20 1844 09/21/20 0420 09/22/20 0549  WBC 4.9  --   --   --   NEUTROABS 2.3  --   --   --   HGB 9.2* 9.2* 9.4* 9.7*  HCT 28.1* 27.0* 28.4* 29.8*  MCV 105.6*  --   --   --   PLT 174  --   --   --    Cardiac Enzymes: No results for input(s): CKTOTAL, CKMB, CKMBINDEX, TROPONINI in the last 168 hours. BNP: Invalid input(s): POCBNP CBG: Recent Labs  Lab 09/20/20 0623 09/21/20 0448 09/22/20 0520  GLUCAP 70 77 63*   D-Dimer No results for input(s): DDIMER in the last 72 hours. Hgb A1c No results for input(s): HGBA1C in the last 72 hours. Lipid Profile No results for input(s): CHOL, HDL, LDLCALC, TRIG, CHOLHDL, LDLDIRECT in the last 72 hours. Thyroid function studies No results for input(s): TSH, T4TOTAL, T3FREE, THYROIDAB in the last 72 hours.  Invalid  input(s): FREET3 Anemia work up No results for input(s): VITAMINB12, FOLATE, FERRITIN, TIBC, IRON, RETICCTPCT in the last 72 hours. Urinalysis    Component Value Date/Time   COLORURINE STRAW (A) 09/20/2020 0056   APPEARANCEUR CLEAR 09/20/2020 0056   LABSPEC 1.018 09/20/2020 0056   PHURINE 5.0 09/20/2020 0056   GLUCOSEU NEGATIVE 09/20/2020 0056   HGBUR NEGATIVE 09/20/2020 0056   BILIRUBINUR NEGATIVE 09/20/2020 0056   KETONESUR NEGATIVE 09/20/2020 0056   PROTEINUR NEGATIVE 09/20/2020 0056   NITRITE NEGATIVE 09/20/2020 0056   LEUKOCYTESUR NEGATIVE 09/20/2020 0056   Sepsis Labs Invalid input(s): PROCALCITONIN,  WBC,  LACTICIDVEN Microbiology Recent Results (from the past 240 hour(s))  Blood culture (routine x 2)     Status: None (Preliminary result)  Collection Time: 09/19/20  6:14 PM   Specimen: BLOOD  Result Value Ref Range Status   Specimen Description   Final    BLOOD BLOOD LEFT FOREARM Performed at [facility], [address] [place], [place] [address]    Special Requests   Final    BOTTLES DRAWN AEROBIC AND ANAEROBIC Blood Culture adequate volume Performed at [facility], [address] [place], [place] [address]    Culture   Final    NO GROWTH 3 DAYS Performed at [facility], [address], [place], [place] [address]    Report Status PENDING  Incomplete  Resp Panel by RT-PCR (Flu A&B, Covid) Nasopharyngeal Swab     Status: None   Collection Time: 09/19/20  6:15 PM   Specimen: Nasopharyngeal Swab; Nasopharyngeal(NP) swabs in vial transport medium  Result Value Ref Range Status   SARS Coronavirus 2 by RT PCR NEGATIVE NEGATIVE Final    Comment: (NOTE) SARS-CoV-2 target nucleic acids are NOT DETECTED.  The SARS-CoV-2 RNA is generally detectable in upper respiratory specimens during the acute phase of infection. The lowest concentration of SARS-CoV-2 viral copies this assay can detect is 138 copies/mL. A negative  result does not preclude SARS-Cov-2 infection and should not be used as the sole basis for treatment or other patient management decisions. A negative result may occur with  improper specimen collection/handling, submission of specimen other than nasopharyngeal swab, presence of viral mutation(s) within the areas targeted by this assay, and inadequate number of viral copies(<138 copies/mL). A negative result must be combined with clinical observations, patient history, and epidemiological information. The expected result is Negative.  Fact Sheet for Patients:  BloggerCourse.com  Fact Sheet for Healthcare Providers:  SeriousBroker.it  This test is no t yet approved or cleared by the [place] FDA and  has been authorized for detection and/or diagnosis of SARS-CoV-2 by FDA under an Emergency Use Authorization (EUA). This EUA will remain  in effect (meaning this test can be used) for the duration of the COVID-19 declaration under Section 564(b)(1) of the Act, 21 U.S.C.section 360bbb-3(b)(1), unless the authorization is terminated  or revoked sooner.       Influenza A by PCR NEGATIVE NEGATIVE Final   Influenza B by PCR NEGATIVE NEGATIVE Final    Comment: (NOTE) The Xpert Xpress SARS-CoV-2/FLU/RSV plus assay is intended as an aid in the diagnosis of influenza from Nasopharyngeal swab specimens and should not be used as a sole basis for treatment. Nasal washings and aspirates are unacceptable for Xpert Xpress SARS-CoV-2/FLU/RSV testing.  Fact Sheet for Patients: BloggerCourse.com  Fact Sheet for Healthcare Providers: SeriousBroker.it  This test is not yet approved or cleared by the [place] FDA and has been authorized for detection and/or diagnosis of SARS-CoV-2 by FDA under an Emergency Use Authorization (EUA). This EUA will remain in effect (meaning this test can be used)  for the duration of the COVID-19 declaration under Section 564(b)(1) of the Act, 21 U.S.C. section 360bbb-3(b)(1), unless the authorization is terminated or revoked.  Performed at [facility], [address] [place], [place] [address]   Urine Culture     Status: Abnormal   Collection Time: 09/19/20  7:30 PM   Specimen: Urine, Clean Catch  Result Value Ref Range Status   Specimen Description   Final    URINE, CLEAN CATCH Performed at [facility], [address], [place], [place] [address]    Special Requests  Final    NONE Performed at [facility], [address], [place], [place] [address]    Culture MULTIPLE SPECIES PRESENT, SUGGEST RECOLLECTION (A)  Final   Report Status 09/21/2020 FINAL  Final  C Difficile Quick Screen w PCR reflex     Status: None   Collection Time: 09/19/20 10:00 PM   Specimen: STOOL  Result Value Ref Range Status   C Diff antigen NEGATIVE NEGATIVE Final   C Diff toxin NEGATIVE NEGATIVE Final   C Diff interpretation No C. difficile detected.  Final    Comment: Performed at [facility], [address] [place], [place] [address]  Gastrointestinal Panel by PCR , Stool     Status: None   Collection Time: 09/19/20 10:00 PM   Specimen: STOOL  Result Value Ref Range Status   Campylobacter species NOT DETECTED NOT DETECTED Final   Plesimonas shigelloides NOT DETECTED NOT DETECTED Final   Salmonella species NOT DETECTED NOT DETECTED Final   Yersinia enterocolitica NOT DETECTED NOT DETECTED Final   Vibrio species NOT DETECTED NOT DETECTED Final   Vibrio cholerae NOT DETECTED NOT DETECTED Final   Enteroaggregative E coli (EAEC) NOT DETECTED NOT DETECTED Final   Enteropathogenic E coli (EPEC) NOT DETECTED NOT DETECTED Final   Enterotoxigenic E coli (ETEC) NOT DETECTED NOT DETECTED Final   Shiga like toxin producing E coli (STEC) NOT DETECTED NOT DETECTED Final    Shigella/Enteroinvasive E coli (EIEC) NOT DETECTED NOT DETECTED Final   Cryptosporidium NOT DETECTED NOT DETECTED Final   Cyclospora cayetanensis NOT DETECTED NOT DETECTED Final   Entamoeba histolytica NOT DETECTED NOT DETECTED Final   Giardia lamblia NOT DETECTED NOT DETECTED Final   Adenovirus F40/41 NOT DETECTED NOT DETECTED Final   Astrovirus NOT DETECTED NOT DETECTED Final   Norovirus GI/GII NOT DETECTED NOT DETECTED Final   Rotavirus A NOT DETECTED NOT DETECTED Final   Sapovirus (I, II, IV, and V) NOT DETECTED NOT DETECTED Final    Comment: Performed at [facility], [address] [place], [place] [address]  Urine Culture     Status: Abnormal   Collection Time: 09/20/20 12:56 AM   Specimen: Urine, Clean Catch  Result Value Ref Range Status   Specimen Description   Final    URINE, CLEAN CATCH Performed at [facility], [address], [place], [place] [address]    Special Requests   Final    NONE Performed at [facility], [address] [place], [place] [address]    Culture (A)  Final    <10,000 COLONIES/mL INSIGNIFICANT GROWTH Performed at [facility], [address], [place], [place] [address]    Report Status 09/21/2020 FINAL  Final     Time coordinating discharge: Over 30 minutes  SIGNED:   Cipriano Bunker, MD  [facility] 09/22/2020, 2:00 PM Pager   If 7PM-7AM, please contact night-coverage [email or web address]

## 2020-09-24 LAB — CULTURE, BLOOD (ROUTINE X 2)
Culture: NO GROWTH
Special Requests: ADEQUATE

## 2021-01-21 ENCOUNTER — Other Ambulatory Visit (HOSPITAL_COMMUNITY): Payer: Self-pay | Admitting: Gastroenterology

## 2021-01-21 ENCOUNTER — Other Ambulatory Visit: Payer: Self-pay | Admitting: Gastroenterology

## 2021-01-21 ENCOUNTER — Other Ambulatory Visit: Payer: Self-pay | Admitting: Student

## 2021-01-21 DIAGNOSIS — R1319 Other dysphagia: Secondary | ICD-10-CM

## 2021-01-26 ENCOUNTER — Other Ambulatory Visit: Payer: Self-pay

## 2021-01-26 ENCOUNTER — Inpatient Hospital Stay (HOSPITAL_COMMUNITY)
Admission: EM | Admit: 2021-01-26 | Discharge: 2021-01-30 | DRG: 391 | Disposition: A | Discharge status: Home / Self Care | Payer: Medicare Other | Attending: Internal Medicine | Admitting: Internal Medicine

## 2021-01-26 ENCOUNTER — Emergency Department (HOSPITAL_COMMUNITY): Payer: Medicare Other

## 2021-01-26 ENCOUNTER — Encounter (HOSPITAL_COMMUNITY): Payer: Self-pay | Admitting: Emergency Medicine

## 2021-01-26 DIAGNOSIS — R64 Cachexia: Secondary | ICD-10-CM | POA: Diagnosis present

## 2021-01-26 DIAGNOSIS — R55 Syncope and collapse: Secondary | ICD-10-CM | POA: Diagnosis present

## 2021-01-26 DIAGNOSIS — Z79899 Other long term (current) drug therapy: Secondary | ICD-10-CM

## 2021-01-26 DIAGNOSIS — R197 Diarrhea, unspecified: Secondary | ICD-10-CM | POA: Diagnosis present

## 2021-01-26 DIAGNOSIS — E876 Hypokalemia: Secondary | ICD-10-CM | POA: Diagnosis not present

## 2021-01-26 DIAGNOSIS — K222 Esophageal obstruction: Secondary | ICD-10-CM | POA: Diagnosis present

## 2021-01-26 DIAGNOSIS — E875 Hyperkalemia: Secondary | ICD-10-CM | POA: Diagnosis present

## 2021-01-26 DIAGNOSIS — Z7983 Long term (current) use of bisphosphonates: Secondary | ICD-10-CM

## 2021-01-26 DIAGNOSIS — E872 Acidosis, unspecified: Secondary | ICD-10-CM | POA: Diagnosis present

## 2021-01-26 DIAGNOSIS — R1310 Dysphagia, unspecified: Secondary | ICD-10-CM | POA: Diagnosis not present

## 2021-01-26 DIAGNOSIS — R571 Hypovolemic shock: Secondary | ICD-10-CM | POA: Diagnosis present

## 2021-01-26 DIAGNOSIS — K52832 Lymphocytic colitis: Principal | ICD-10-CM | POA: Diagnosis present

## 2021-01-26 DIAGNOSIS — Z20822 Contact with and (suspected) exposure to covid-19: Secondary | ICD-10-CM | POA: Diagnosis present

## 2021-01-26 DIAGNOSIS — N179 Acute kidney failure, unspecified: Secondary | ICD-10-CM | POA: Diagnosis present

## 2021-01-26 DIAGNOSIS — K31819 Angiodysplasia of stomach and duodenum without bleeding: Secondary | ICD-10-CM | POA: Diagnosis present

## 2021-01-26 DIAGNOSIS — Z681 Body mass index (BMI) 19 or less, adult: Secondary | ICD-10-CM

## 2021-01-26 DIAGNOSIS — E43 Unspecified severe protein-calorie malnutrition: Secondary | ICD-10-CM | POA: Diagnosis present

## 2021-01-26 DIAGNOSIS — Z90710 Acquired absence of both cervix and uterus: Secondary | ICD-10-CM

## 2021-01-26 DIAGNOSIS — Z7952 Long term (current) use of systemic steroids: Secondary | ICD-10-CM | POA: Diagnosis not present

## 2021-01-26 DIAGNOSIS — E2740 Unspecified adrenocortical insufficiency: Secondary | ICD-10-CM | POA: Diagnosis present

## 2021-01-26 DIAGNOSIS — E860 Dehydration: Secondary | ICD-10-CM | POA: Diagnosis present

## 2021-01-26 DIAGNOSIS — D539 Nutritional anemia, unspecified: Secondary | ICD-10-CM | POA: Diagnosis present

## 2021-01-26 DIAGNOSIS — I10 Essential (primary) hypertension: Secondary | ICD-10-CM | POA: Diagnosis present

## 2021-01-26 DIAGNOSIS — K449 Diaphragmatic hernia without obstruction or gangrene: Secondary | ICD-10-CM | POA: Diagnosis present

## 2021-01-26 DIAGNOSIS — K269 Duodenal ulcer, unspecified as acute or chronic, without hemorrhage or perforation: Secondary | ICD-10-CM | POA: Diagnosis present

## 2021-01-26 DIAGNOSIS — K2950 Unspecified chronic gastritis without bleeding: Secondary | ICD-10-CM | POA: Diagnosis present

## 2021-01-26 DIAGNOSIS — F17210 Nicotine dependence, cigarettes, uncomplicated: Secondary | ICD-10-CM | POA: Diagnosis present

## 2021-01-26 DIAGNOSIS — Z8249 Family history of ischemic heart disease and other diseases of the circulatory system: Secondary | ICD-10-CM

## 2021-01-26 LAB — CBC WITH DIFFERENTIAL/PLATELET
Abs Immature Granulocytes: 0.1 10*3/uL — ABNORMAL HIGH (ref 0.00–0.07)
Basophils Absolute: 0 10*3/uL (ref 0.0–0.1)
Basophils Relative: 0 %
Eosinophils Absolute: 0.1 10*3/uL (ref 0.0–0.5)
Eosinophils Relative: 1 %
HCT: 27.9 % — ABNORMAL LOW (ref 36.0–46.0)
Hemoglobin: 9.4 g/dL — ABNORMAL LOW (ref 12.0–15.0)
Immature Granulocytes: 1 %
Lymphocytes Relative: 19 %
Lymphs Abs: 1.7 10*3/uL (ref 0.7–4.0)
MCH: 34.8 pg — ABNORMAL HIGH (ref 26.0–34.0)
MCHC: 33.7 g/dL (ref 30.0–36.0)
MCV: 103.3 fL — ABNORMAL HIGH (ref 80.0–100.0)
Monocytes Absolute: 0.7 10*3/uL (ref 0.1–1.0)
Monocytes Relative: 8 %
Neutro Abs: 6.5 10*3/uL (ref 1.7–7.7)
Neutrophils Relative %: 71 %
Platelets: 162 10*3/uL (ref 150–400)
RBC: 2.7 MIL/uL — ABNORMAL LOW (ref 3.87–5.11)
RDW: 14.3 % (ref 11.5–15.5)
WBC: 9.1 10*3/uL (ref 4.0–10.5)
nRBC: 0 % (ref 0.0–0.2)

## 2021-01-26 LAB — MAGNESIUM
Magnesium: 1.5 mg/dL — ABNORMAL LOW (ref 1.7–2.4)
Magnesium: UNDETERMINED mg/dL (ref 1.7–2.4)

## 2021-01-26 LAB — PROTIME-INR
INR: 1.1 (ref 0.8–1.2)
Prothrombin Time: 13.9 seconds (ref 11.4–15.2)

## 2021-01-26 LAB — URINALYSIS, ROUTINE W REFLEX MICROSCOPIC
Bilirubin Urine: NEGATIVE
Glucose, UA: NEGATIVE mg/dL
Hgb urine dipstick: NEGATIVE
Ketones, ur: NEGATIVE mg/dL
Leukocytes,Ua: NEGATIVE
Nitrite: NEGATIVE
Protein, ur: NEGATIVE mg/dL
Specific Gravity, Urine: 1.029 (ref 1.005–1.030)
pH: 7 (ref 5.0–8.0)

## 2021-01-26 LAB — CBC
HCT: 29.7 % — ABNORMAL LOW (ref 36.0–46.0)
Hemoglobin: 10 g/dL — ABNORMAL LOW (ref 12.0–15.0)
MCH: 34.4 pg — ABNORMAL HIGH (ref 26.0–34.0)
MCHC: 33.7 g/dL (ref 30.0–36.0)
MCV: 102.1 fL — ABNORMAL HIGH (ref 80.0–100.0)
Platelets: 160 10*3/uL (ref 150–400)
RBC: 2.91 MIL/uL — ABNORMAL LOW (ref 3.87–5.11)
RDW: 14.3 % (ref 11.5–15.5)
WBC: 9.3 10*3/uL (ref 4.0–10.5)
nRBC: 0 % (ref 0.0–0.2)

## 2021-01-26 LAB — BASIC METABOLIC PANEL
Anion gap: 9 (ref 5–15)
BUN: 27 mg/dL — ABNORMAL HIGH (ref 8–23)
CO2: 17 mmol/L — ABNORMAL LOW (ref 22–32)
Calcium: 8.3 mg/dL — ABNORMAL LOW (ref 8.9–10.3)
Chloride: 110 mmol/L (ref 98–111)
Creatinine, Ser: 1.23 mg/dL — ABNORMAL HIGH (ref 0.44–1.00)
GFR, Estimated: 45 mL/min — ABNORMAL LOW (ref >60)
Glucose, Bld: 129 mg/dL — ABNORMAL HIGH (ref 70–99)
Potassium: 3.2 mmol/L — ABNORMAL LOW (ref 3.5–5.1)
Sodium: 136 mmol/L (ref 135–145)

## 2021-01-26 LAB — I-STAT CHEM 8, ED
BUN: 29 mg/dL — ABNORMAL HIGH (ref 8–23)
Calcium, Ion: 1.18 mmol/L (ref 1.15–1.40)
Chloride: 105 mmol/L (ref 98–111)
Creatinine, Ser: 1.5 mg/dL — ABNORMAL HIGH (ref 0.44–1.00)
Glucose, Bld: 125 mg/dL — ABNORMAL HIGH (ref 70–99)
HCT: 28 % — ABNORMAL LOW (ref 36.0–46.0)
Hemoglobin: 9.5 g/dL — ABNORMAL LOW (ref 12.0–15.0)
Potassium: 2.2 mmol/L — CL (ref 3.5–5.1)
Sodium: 141 mmol/L (ref 135–145)
TCO2: 22 mmol/L (ref 22–32)

## 2021-01-26 LAB — MRSA NEXT GEN BY PCR, NASAL: MRSA by PCR Next Gen: NOT DETECTED

## 2021-01-26 LAB — LACTIC ACID, PLASMA
Lactic Acid, Venous: 2.9 mmol/L (ref 0.5–1.9)
Lactic Acid, Venous: 1.5 mmol/L (ref 0.5–1.9)

## 2021-01-26 LAB — APTT: aPTT: 28 seconds (ref 24–36)

## 2021-01-26 LAB — C DIFFICILE QUICK SCREEN W PCR REFLEX
C Diff antigen: NEGATIVE
C Diff interpretation: NOT DETECTED
C Diff toxin: NEGATIVE

## 2021-01-26 LAB — RESP PANEL BY RT-PCR (FLU A&B, COVID) ARPGX2
Influenza A by PCR: NEGATIVE
Influenza B by PCR: NEGATIVE
SARS Coronavirus 2 by RT PCR: NEGATIVE

## 2021-01-26 LAB — CORTISOL: Cortisol, Plasma: 24.1 ug/dL

## 2021-01-26 LAB — PHOSPHORUS: Phosphorus: 2.3 mg/dL — ABNORMAL LOW (ref 2.5–4.6)

## 2021-01-26 MED ORDER — NOREPINEPHRINE 4 MG/250ML-% IV SOLN
2.0000 ug/min | INTRAVENOUS | Status: DC
Start: 2021-01-26 — End: 2021-01-27
  Administered 2021-01-26: 20:00:00 4 ug/min via INTRAVENOUS

## 2021-01-26 MED ORDER — MAGNESIUM SULFATE 2 GM/50ML IV SOLN
2.0000 g | Freq: Once | INTRAVENOUS | Status: AC
  Administered 2021-01-26: 17:00:00 2 g via INTRAVENOUS
  Filled 2021-01-26: qty 50

## 2021-01-26 MED ORDER — CHLORHEXIDINE GLUCONATE CLOTH 2 % EX PADS
6.0000 | MEDICATED_PAD | Freq: Every day | CUTANEOUS | Status: DC
  Administered 2021-01-26 – 2021-01-27 (×2): 6 via TOPICAL

## 2021-01-26 MED ORDER — NOREPINEPHRINE 4 MG/250ML-% IV SOLN
0.0000 ug/min | INTRAVENOUS | Status: DC
Start: 2021-01-26 — End: 2021-01-26
  Administered 2021-01-26: 17:00:00 2 ug/min via INTRAVENOUS
  Filled 2021-01-26: qty 250

## 2021-01-26 MED ORDER — LACTATED RINGERS IV SOLN: INTRAVENOUS | Status: DC

## 2021-01-26 MED ORDER — POTASSIUM CHLORIDE 10 MEQ/100ML IV SOLN
10.0000 meq | INTRAVENOUS | Status: AC
  Administered 2021-01-26 (×3): 10 meq via INTRAVENOUS
  Filled 2021-01-26 (×3): qty 100

## 2021-01-26 MED ORDER — POTASSIUM PHOSPHATES 15 MMOLE/5ML IV SOLN
15.0000 mmol | Freq: Once | INTRAVENOUS | Status: AC
  Administered 2021-01-27: 15 mmol via INTRAVENOUS
  Filled 2021-01-26: qty 5

## 2021-01-26 MED ORDER — LACTATED RINGERS IV BOLUS
1000.0000 mL | Freq: Once | INTRAVENOUS | Status: AC
  Administered 2021-01-26: 16:00:00 1000 mL via INTRAVENOUS

## 2021-01-26 MED ORDER — HEPARIN SODIUM (PORCINE) 5000 UNIT/ML IJ SOLN
5000.0000 [IU] | Freq: Three times a day (TID) | INTRAMUSCULAR | Status: DC
  Administered 2021-01-26 – 2021-01-30 (×11): 5000 [IU] via SUBCUTANEOUS
  Filled 2021-01-26 (×11): qty 1

## 2021-01-26 MED ORDER — KCL IN DEXTROSE-NACL 40-5-0.45 MEQ/L-%-% IV SOLN
INTRAVENOUS | Status: DC
  Filled 2021-01-26 (×5): qty 1000

## 2021-01-26 MED ORDER — PHENYLEPHRINE HCL-NACL 20-0.9 MG/250ML-% IV SOLN
0.0000 ug/min | INTRAVENOUS | Status: DC
Start: 2021-01-26 — End: 2021-01-26

## 2021-01-26 MED ORDER — DOCUSATE SODIUM 100 MG PO CAPS: 100.0000 mg | ORAL_CAPSULE | Freq: Two times a day (BID) | ORAL | Status: DC | PRN

## 2021-01-26 MED ORDER — LACTATED RINGERS IV BOLUS
1000.0000 mL | Freq: Once | INTRAVENOUS | Status: AC
  Administered 2021-01-26: 15:00:00 1000 mL via INTRAVENOUS

## 2021-01-26 MED ORDER — ONDANSETRON HCL 4 MG/2ML IJ SOLN
4.0000 mg | Freq: Four times a day (QID) | INTRAMUSCULAR | Status: DC | PRN
  Administered 2021-01-28: 04:00:00 4 mg via INTRAVENOUS
  Filled 2021-01-26: qty 2

## 2021-01-26 MED ORDER — HYDROCORTISONE SOD SUC (PF) 100 MG IJ SOLR: 100.0000 mg | Freq: Two times a day (BID) | INTRAMUSCULAR | Status: DC

## 2021-01-26 MED ORDER — SODIUM CHLORIDE 0.9 % IV SOLN
250.0000 mL | INTRAVENOUS | Status: DC
  Administered 2021-01-26: 20:00:00 250 mL via INTRAVENOUS

## 2021-01-26 MED ORDER — POTASSIUM CHLORIDE 20 MEQ PO PACK
40.0000 meq | PACK | Freq: Two times a day (BID) | ORAL | Status: DC
  Administered 2021-01-26 – 2021-01-27 (×4): 40 meq via ORAL
  Filled 2021-01-26 (×4): qty 2

## 2021-01-26 MED ORDER — POLYETHYLENE GLYCOL 3350 17 G PO PACK: 17.0000 g | PACK | Freq: Every day | ORAL | Status: DC | PRN

## 2021-01-26 NOTE — Progress Notes (Signed)
US Guided PIV placed on right anterior forearm, end of tip of catheter marked "x" on skin; bedside RN was given instructions for close watch.

## 2021-01-26 NOTE — ED Notes (Signed)
Pt taken off epi at this time per verbal order from PA.

## 2021-01-26 NOTE — ED Triage Notes (Signed)
Pt arrives via EMS from home for hypotension and syncopal episode today. Syncopal episode witness by family Systolic BP in the 50s. Pt arrives on epi gtt at 4 mcg/min. Pt reports diarrhea x 2 weeks. Pt also states that she had esophagus stretched 2 weeks ago due to issues with swallowing. Pt reports abd pain following procedure which has limited PO intake.

## 2021-01-26 NOTE — ED Provider Notes (Signed)
[facility]ERGENCY DEPARTMENT Provider Note   CSN: [id number] Arrival date & time: 01/26/21  1500     History Chief Complaint  Patient presents with   Hypotension    Wanda Valdez is a 75 y.o. female presenting for evaluation of weakness and syncope.  Per EMS, patient had a syncopal event that last about 5 minutes.  Just prior to the arrival, patient became alert.  On their arrival, blood pressure of 50 palpated.  Patient did not respond to 500 cc bolus, as such, epi drip was started.  Patient states she has had diarrhea for 2 weeks.  She also reports a history of esophageal stricture, has a planned dilation in December.  She has been unable to tolerate solid food for several months, drinking only liquids.  In the past 4 days, she has had decreased urination, states she is only dribbling.  She denies chest pain, shortness of breath, vomiting.   Additional history obtained from patient's daughter, who is present the house.  She states patient was more tired than normal this morning.  When walking down the hall, she passed out and was unresponsive for 5 minutes before coming to.  HPI     Past Medical History:  Diagnosis Date   Hypertension     Patient Active Problem List   Diagnosis Date Noted   Syncope 09/19/2020   Current chronic use of systemic steroids 09/19/2020   Protein-calorie malnutrition, severe 09/12/2020   Hypokalemia 09/10/2020   Dysphagia 09/10/2020   Diarrhea 09/10/2020   Dehydration 09/10/2020   Transaminitis 09/10/2020   AKI (acute kidney injury) ([place]) 09/10/2020   Lymphocytic colitis 09/10/2020   Rib pain on right side 09/10/2020   SOB (shortness of breath)     Past Surgical History:  Procedure Laterality Date   ABDOMINAL HYSTERECTOMY     BIOPSY  09/11/2020   Procedure: BIOPSY;  Surgeon: [name], MD;  Location: [facility];  Service: Gastroenterology;;   BIOPSY  09/13/2020   Procedure: BIOPSY;  Surgeon:  [name], MD;  Location: [facility];  Service: Gastroenterology;;   COLONOSCOPY N/A 09/13/2020   Procedure: COLONOSCOPY;  Surgeon: [name], MD;  Location: [facility];  Service: Gastroenterology;  Laterality: N/A;   ESOPHAGOGASTRODUODENOSCOPY N/A 09/11/2020   Procedure: ESOPHAGOGASTRODUODENOSCOPY (EGD);  Surgeon: [name], MD;  Location: [facility];  Service: Gastroenterology;  Laterality: N/A;   POLYPECTOMY  09/13/2020   Procedure: POLYPECTOMY;  Surgeon: [name], MD;  Location: [facility];  Service: Gastroenterology;;     OB History   No obstetric history on file.     Family History  Problem Relation Age of Onset   Cirrhosis Mother    Heart attack Father     Social History   Tobacco Use   Smoking status: Some Days    Types: Cigarettes   Smokeless tobacco: Never  Vaping Use   Vaping Use: Never used  Substance Use Topics   Alcohol use: Yes    Comment: occassional   Drug use: Never    Home Medications Prior to Admission medications   Medication Sig Start Date End Date Taking? Authorizing Provider  alendronate (FOSAMAX) 35 MG tablet Take 35 mg by mouth once a week. 03/28/20   [provider]  allopurinol (ZYLOPRIM) 100 MG tablet Take 100 mg by mouth daily. 08/10/20   [provider]  cholestyramine light (PREVALITE) 4 g packet Take 1 packet (4 g total) by mouth 2 (two) times daily. 09/22/20   Shawna Clamp, MD  CREON 6000-19000 units CPEP Take 2 capsules by mouth 3 (three) times daily. 05/11/20   [provider]  furosemide (LASIX) 20 MG tablet Take 20 mg by mouth daily.    [provider]  Multiple Vitamin (MULTIVITAMIN WITH MINERALS) TABS tablet Take 1 tablet by mouth daily. 09/14/20   Pahwani, Michell Heinrich, MD  pantoprazole (PROTONIX) 40 MG tablet Take 1 tablet (40 mg total) by mouth daily. 09/14/20   Pahwani, Michell Heinrich, MD  potassium chloride SA (KLOR-CON) 20 MEQ tablet Take 20 mEq by mouth daily. 07/19/20    [provider]  predniSONE (DELTASONE) 10 MG tablet Take 10 mg by mouth daily with breakfast.    [provider]  sucralfate (CARAFATE) 1 g tablet Take 1 g by mouth 3 (three) times daily. Dissolve 1 tablet in 2 ounces of water 05/27/20   [provider]    Allergies    Patient has no known allergies.  Review of Systems   Review of Systems  Gastrointestinal:  Positive for diarrhea and nausea.  Neurological:  Positive for syncope and weakness.  All other systems reviewed and are negative.  Physical Exam Updated Vital Signs BP (!) 77/65   Pulse 65   Resp 15   Ht 4\' 10"  (1.473 m)   Wt 35.4 kg   SpO2 94%   BMI 16.30 kg/m   Physical Exam Vitals and nursing note reviewed.  Constitutional:      Appearance: She is cachectic. She is ill-appearing.     Comments: Appears ill  HENT:     Head: Normocephalic and atraumatic.  Eyes:     Conjunctiva/sclera: Conjunctivae normal.     Pupils: Pupils are equal, round, and reactive to light.  Cardiovascular:     Rate and Rhythm: Normal rate and regular rhythm.     Pulses: Normal pulses.  Pulmonary:     Effort: Pulmonary effort is normal. No respiratory distress.     Breath sounds: Normal breath sounds. No wheezing.     Comments: Speaking in full sentences.  Clear lung sounds in all fields. Abdominal:     General: There is no distension.     Palpations: Abdomen is soft. There is no mass.     Tenderness: There is no abdominal tenderness. There is no guarding or rebound.  Musculoskeletal:        General: Normal range of motion.     Cervical back: Normal range of motion and neck supple.  Skin:    General: Skin is warm and dry.     Capillary Refill: Capillary refill takes less than 2 seconds.  Neurological:     Mental Status: She is alert and oriented to person, place, and time.  Psychiatric:        Mood and Affect: Mood and affect normal.        Speech: Speech normal.        Behavior: Behavior normal.    ED  Results / Procedures / Treatments   Labs (all labs ordered are listed, but only abnormal results are displayed) Labs Reviewed  CBC WITH DIFFERENTIAL/PLATELET - Abnormal; Notable for the following components:      Result Value   RBC 2.70 (*)    Hemoglobin 9.4 (*)    HCT 27.9 (*)    MCV 103.3 (*)    MCH 34.8 (*)    Abs Immature Granulocytes 0.10 (*)    All other components within normal limits  COMPREHENSIVE METABOLIC PANEL - Abnormal; Notable for the following  components:   Potassium 2.3 (*)    CO2 21 (*)    Glucose, Bld 136 (*)    BUN 31 (*)    Creatinine, Ser 1.50 (*)    Calcium 8.1 (*)    Total Protein 4.1 (*)    Albumin 1.9 (*)    GFR, Estimated 36 (*)    All other components within normal limits  LACTIC ACID, PLASMA - Abnormal; Notable for the following components:   Lactic Acid, Venous 2.9 (*)    All other components within normal limits  MAGNESIUM - Abnormal; Notable for the following components:   Magnesium 1.5 (*)    All other components within normal limits  I-STAT CHEM 8, ED - Abnormal; Notable for the following components:   Potassium 2.2 (*)    BUN 29 (*)    Creatinine, Ser 1.50 (*)    Glucose, Bld 125 (*)    Hemoglobin 9.5 (*)    HCT 28.0 (*)    All other components within normal limits  C DIFFICILE QUICK SCREEN W PCR REFLEX    RESP PANEL BY RT-PCR (FLU A&B, COVID) ARPGX2  CULTURE, BLOOD (ROUTINE X 2)  CULTURE, BLOOD (ROUTINE X 2)  GASTROINTESTINAL PANEL BY PCR, STOOL (REPLACES STOOL CULTURE)  PROTIME-INR  APTT  URINALYSIS, ROUTINE W REFLEX MICROSCOPIC  LACTIC ACID, PLASMA    EKG EKG Interpretation  Date/Time:  Sunday January 26 2021 15:14:19 EST Ventricular Rate:  55 PR Interval:  153 QRS Duration: 117 QT Interval:  517 QTC Calculation: 495 R Axis:   14 Text Interpretation: Sinus rhythm Multiple ventricular premature complexes Nonspecific intraventricular conduction delay Borderline repolarization abnormality increased ectopy from prior 7/22  Confirmed by Meridee Score [phone]) on 01/26/2021 3:21:24 PM  Radiology DG Chest Portable 1 View  Result Date: 01/26/2021 CLINICAL DATA:  Weakness EXAM: PORTABLE CHEST 1 VIEW COMPARISON:  Chest x-ray 09/19/2020 FINDINGS: Cardiomediastinal silhouette is unchanged with tortuous ectatic thoracic aorta calcified plaques. No focal consolidation identified. No pleural effusion or pneumothorax. IMPRESSION: No acute intrathoracic process identified. Electronically Signed   By: Jannifer Hick M.D.   On: 01/26/2021 15:46    Procedures .Critical Care Performed by: Alveria Apley, PA-C Authorized by: Alveria Apley, PA-C   Critical care provider statement:    Critical care time (minutes):  50   Critical care time was exclusive of:  Separately billable procedures and treating other patients and teaching time   Critical care was necessary to treat or prevent imminent or life-threatening deterioration of the following conditions:  Shock, metabolic crisis and dehydration   Critical care was time spent personally by me on the following activities:  Blood draw for specimens, development of treatment plan with patient or surrogate, evaluation of patient's response to treatment, examination of patient, obtaining history from patient or surrogate, ordering and performing treatments and interventions, ordering and review of laboratory studies, ordering and review of radiographic studies, pulse oximetry, review of old charts and re-evaluation of patient's condition   I assumed direction of critical care for this patient from another provider in my specialty: no     Care discussed with: admitting provider     Medications Ordered in ED Medications  potassium chloride (KLOR-CON) packet 40 mEq (40 mEq Oral Given 01/26/21 1625)  potassium chloride 10 mEq in 100 mL IVPB (10 mEq Intravenous New Bag/Given 01/26/21 1618)  norepinephrine (LEVOPHED) 4mg  in premix infusion (3 mcg/min Intravenous Rate/Dose  Change 01/26/21 1658)  magnesium sulfate IVPB 2 g 50 mL (2 g Intravenous  New Bag/Given 01/26/21 1656)  lactated ringers infusion ( Intravenous New Bag/Given 01/26/21 1655)  lactated ringers bolus 1,000 mL (0 mLs Intravenous Stopped 01/26/21 1545)  lactated ringers bolus 1,000 mL (1,000 mLs Intravenous New Bag/Given 01/26/21 1551)  lactated ringers bolus 1,000 mL (0 mLs Intravenous Stopped 01/26/21 1618)    ED Course  I have reviewed the triage vital signs and the nursing notes.  Pertinent labs & imaging results that were available during my care of the patient were reviewed by me and considered in my medical decision making (see chart for details).  Clinical Course as of 01/26/21 1712  Sun Jan 26, 2021  1549 She is brought in by EMS for low blood pressure syncopal event.  Recent esophageal intervention and poor intake.  She said she feels very weak and tired but is awake and alert.  Blood pressures in the 50s initially given epinephrine by EMS.  Clinically looks very dry we will max out on fluids.  Needs labs imaging EKG.  Will need admission to hospital. [MB]    Clinical Course User Index [MB] Hayden Rasmussen, MD   MDM Rules/Calculators/A&P                           Patient presenting for evaluation of syncope and weakness.  On exam, patient appears ill.  Hypotensive with EMS, brought in on epi drip.  She clinically appears dry.  Will pause epi drip, give fluid boluses, continue to evaluate.  Patient is alert and oriented.  Patient responding to fluid boluses, after 3 L of LR however, patient remains hypotensive with a blood pressure in the 60s.  Case discussed with attending, Dr. Melina Copa evaluated the patient. As pt remains hypotensive, Levophed started.  We will continue maintenance fluids.    Labs interpreted by me, shows hypokalemia at 2.2.  Replenished orally and IV.  Creatinine elevated from baseline at 1.5, consistent with dehydration.  No leukocytosis.  Lactic is elevated at  2.9, this is likely reactive, as patient is afebrile and I have higher suspicion for dehydration over sepsis.  Chest x-ray viewed and independently interpreted by me, no pneumonia, pneumothorax, effusion. EKG nonischemic.   Patient's blood pressure 94 systolic on 3 mcg/min of Levophed. Will require critical care admission.   Had code discussion with pt, she is full code.   Discussed with critical care, they will evaluate and admit the patient.   Final Clinical Impression(s) / ED Diagnoses Final diagnoses:  Dehydration  Hypokalemia  Hypomagnesemia  Syncope, unspecified syncope type    Rx / DC Orders ED Discharge Orders     None        Franchot Heidelberg, PA-C 01/26/21 1718    Hayden Rasmussen, MD 01/27/21 1100

## 2021-01-26 NOTE — H&P (Signed)
NAME:  Wanda Valdez, MRN:  [id number], DOB:  31-Oct-1945, LOS: 0 ADMISSION DATE:  01/26/2021, CONSULTATION DATE:  11/27 REFERRING MD:  Lisbeth Renshaw, CHIEF COMPLAINT:  Weakness   History of Present Illness:  75 y/o female with history of lymphocytic diarrhea and esophageal stricture presented to the [facility] ER with profound weakness at home in the setting of no po intake and profound diarrhea.   Over the last 4 days she has not been able to swallow solid foods though she has been taking her medications.  Her diarrhea has been worse in the last few days too.  When I ask her about her medications neither she nor her daughter are able to answer fairly basic questions (ie. are you taking prednisone). She denies dyspnea, pain of any kind or bleeding.  She came to the ER because she was very weak.  In the ER she received IV fluids (3.5L) and was started on levophed.  PCCM consulted for further evaluation.    She follows with [facility].  She was supposed to have her esophageal stricture stretched on 12/7 at [facility].   Pertinent  Medical History  Lymphocytic colitis Hypertension Steroid dependence  Significant Hospital Events: Including procedures, antibiotic start and stop dates in addition to other pertinent events   11/27 admission  Interim History / Subjective:  As above  Objective   Blood pressure (!) 82/69, pulse 65, resp. rate 15, height 4\' 10"  (1.473 m), weight 35.4 kg, SpO2 95 %.        Intake/Output Summary (Last 24 hours) at 01/26/2021 1731 Last data filed at 01/26/2021 1618 Gross per 24 hour  Intake 3000 ml  Output --  Net 3000 ml   Filed Weights   01/26/21 1534  Weight: 35.4 kg    Examination: General:  Frail, weak appearing female resting comfortably in bed, wet sounding cough HENT: NCAT OP clear, MMM PULM: Rhonchi, no clear crackles B, normal effort CV: RRR, no mgr GI: BS+, soft, nontender MSK: diminished bulk and tone Neuro: awake, alert, no distress,  MAEW   Resolved Hospital Problem list    Assessment & Plan:  Hypovolemic shock due to poor po intake and worsening diarrhea likely due to a flare of her underlying lymphocytic colitis; presumably complicated by adrenal insufficiency as she is on chronic prednisone > admit to ICU > hold further fluid boluses as she has received aggressive fluid resuscitation here > will start D5 1/2NS with 40KCl to keep up with ongoing loss from diarrhea > levophed titrated to MAP > 65 per peripheral IV pressor protocol > encourage po intake with liquids  Esophageal stricture > Consult GI medicine (called [facility] on 11/27, requested consult in AM) to consider endoscopy this admission  Lymphocytic diarrhea flare> I question whether or not she is taking her prednisone > start hydrocortisone for now (see below), ask GI for further recommendations  Presumable adrenal insufficiency > check cortisol > start hydrocortisone after cortisol sent  Protein calorie malnutrition, severe > dietary consult > check phos  Macrocytic anemia > check methylmalonic acid, TSH  Hypokalemia Hypomagnesemia AKI > fluids as above with KCl > supplement magnesium, KCl > repeat labs at 2100 tonight   Best Practice (right click and "Reselect all SmartList Selections" daily)   Diet/type: full liquids  DVT prophylaxis: prophylactic heparin  GI prophylaxis: N/A Lines: N/A Foley:  N/A Code Status:  full code Last date of multidisciplinary goals of care discussion [11/27 in ER with Chanae Gemma and patient]  Labs  CBC: Recent Labs  Lab 01/26/21 1537 01/26/21 1553  WBC 9.1  --   NEUTROABS 6.5  --   HGB 9.4* 9.5*  HCT 27.9* 28.0*  MCV 103.3*  --   PLT 162  --     Basic Metabolic Panel: Recent Labs  Lab 01/26/21 1537 01/26/21 1553  NA 139 141  K 2.3* 2.2*  CL 108 105  CO2 21*  --   GLUCOSE 136* 125*  BUN 31* 29*  CREATININE 1.50* 1.50*  CALCIUM 8.1*  --   MG 1.5*  --    GFR: Estimated Creatinine  Clearance: 17.6 mL/min (A) (by C-G formula based on SCr of 1.5 mg/dL (H)). Recent Labs  Lab 01/26/21 1537  WBC 9.1  LATICACIDVEN 2.9*    Liver Function Tests: Recent Labs  Lab 01/26/21 1537  AST 21  ALT 14  ALKPHOS 71  BILITOT 0.8  PROT 4.1*  ALBUMIN 1.9*   No results for input(s): LIPASE, AMYLASE in the last 168 hours. No results for input(s): AMMONIA in the last 168 hours.  ABG    Component Value Date/Time   TCO2 22 01/26/2021 1553     Coagulation Profile: Recent Labs  Lab 01/26/21 1537  INR 1.1    Cardiac Enzymes: No results for input(s): CKTOTAL, CKMB, CKMBINDEX, TROPONINI in the last 168 hours.  HbA1C: No results found for: HGBA1C  CBG: No results for input(s): GLUCAP in the last 168 hours.  Review of Systems:   Gen: Denies fever, chills, weight change, fatigue, night sweats HEENT: Denies blurred vision, double vision, hearing loss, tinnitus, sinus congestion, rhinorrhea, sore throat, neck stiffness, dysphagia PULM: Denies shortness of breath, cough, sputum production, hemoptysis, wheezing CV: Denies chest pain, edema, orthopnea, paroxysmal nocturnal dyspnea, palpitations GI: per HPI GU: Denies dysuria, hematuria, polyuria, oliguria, urethral discharge Endocrine: Denies hot or cold intolerance, polyuria, polyphagia or appetite change Derm: Denies rash, dry skin, scaling or peeling skin change Heme: Denies easy bruising, bleeding, bleeding gums Neuro: Denies headache, numbness, weakness, slurred speech, loss of memory or consciousness   Past Medical History:  She,  has a past medical history of Hypertension.   Surgical History:   Past Surgical History:  Procedure Laterality Date   ABDOMINAL HYSTERECTOMY     BIOPSY  09/11/2020   Procedure: BIOPSY;  Surgeon: [name], MD;  Location: [facility];  Service: Gastroenterology;;   BIOPSY  09/13/2020   Procedure: BIOPSY;  Surgeon: [name], MD;  Location: [facility];  Service:  Gastroenterology;;   COLONOSCOPY N/A 09/13/2020   Procedure: COLONOSCOPY;  Surgeon: [name], MD;  Location: [facility];  Service: Gastroenterology;  Laterality: N/A;   ESOPHAGOGASTRODUODENOSCOPY N/A 09/11/2020   Procedure: ESOPHAGOGASTRODUODENOSCOPY (EGD);  Surgeon: [name], MD;  Location: [facility];  Service: Gastroenterology;  Laterality: N/A;   POLYPECTOMY  09/13/2020   Procedure: POLYPECTOMY;  Surgeon: [name], MD;  Location: [facility];  Service: Gastroenterology;;     Social History:   reports that she has been smoking cigarettes. She has never used smokeless tobacco. She reports current alcohol use. She reports that she does not use drugs.   Family History:  Her family history includes Cirrhosis in her mother; Heart attack in her father.   Allergies No Known Allergies   Home Medications  Prior to Admission medications   Medication Sig Start Date End Date Taking? Authorizing Provider  alendronate (FOSAMAX) 35 MG tablet Take 35 mg by mouth once a week. 03/28/20   [provider]  allopurinol (ZYLOPRIM) 100  MG tablet Take 100 mg by mouth daily. 08/10/20   [provider]  cholestyramine light (PREVALITE) 4 g packet Take 1 packet (4 g total) by mouth 2 (two) times daily. 09/22/20   Shawna Clamp, MD  CREON 6000-19000 units CPEP Take 2 capsules by mouth 3 (three) times daily. 05/11/20   [provider]  furosemide (LASIX) 20 MG tablet Take 20 mg by mouth daily.    [provider]  Multiple Vitamin (MULTIVITAMIN WITH MINERALS) TABS tablet Take 1 tablet by mouth daily. 09/14/20   Pahwani, Michell Heinrich, MD  pantoprazole (PROTONIX) 40 MG tablet Take 1 tablet (40 mg total) by mouth daily. 09/14/20   Pahwani, Michell Heinrich, MD  potassium chloride SA (KLOR-CON) 20 MEQ tablet Take 20 mEq by mouth daily. 07/19/20   [provider]  predniSONE (DELTASONE) 10 MG tablet Take 10 mg by mouth daily with breakfast.    [provider]  sucralfate (CARAFATE) 1 g tablet Take 1 g by mouth 3 (three) times daily. Dissolve 1 tablet in 2 ounces of water 05/27/20   [provider]     Critical care time: 35 minutes    Roselie Awkward, MD [place] [facility] Pager: [phone] Cell: [phone] After 7:00 pm call Elink  [phone]

## 2021-01-27 ENCOUNTER — Encounter (HOSPITAL_COMMUNITY): Payer: Self-pay | Admitting: Pulmonary Disease

## 2021-01-27 DIAGNOSIS — R1310 Dysphagia, unspecified: Secondary | ICD-10-CM | POA: Diagnosis not present

## 2021-01-27 DIAGNOSIS — R197 Diarrhea, unspecified: Secondary | ICD-10-CM | POA: Diagnosis not present

## 2021-01-27 DIAGNOSIS — R571 Hypovolemic shock: Secondary | ICD-10-CM | POA: Diagnosis not present

## 2021-01-27 DIAGNOSIS — E876 Hypokalemia: Secondary | ICD-10-CM | POA: Diagnosis not present

## 2021-01-27 LAB — GASTROINTESTINAL PANEL BY PCR, STOOL (REPLACES STOOL CULTURE)

## 2021-01-27 LAB — CBC
HCT: 28 % — ABNORMAL LOW (ref 36.0–46.0)
Hemoglobin: 9.8 g/dL — ABNORMAL LOW (ref 12.0–15.0)
MCH: 35.5 pg — ABNORMAL HIGH (ref 26.0–34.0)
MCHC: 35 g/dL (ref 30.0–36.0)
MCV: 101.4 fL — ABNORMAL HIGH (ref 80.0–100.0)
Platelets: 171 10*3/uL (ref 150–400)
RBC: 2.76 MIL/uL — ABNORMAL LOW (ref 3.87–5.11)
RDW: 14.3 % (ref 11.5–15.5)
WBC: 7.8 10*3/uL (ref 4.0–10.5)
nRBC: 0 % (ref 0.0–0.2)

## 2021-01-27 LAB — BASIC METABOLIC PANEL
Anion gap: 5 (ref 5–15)
BUN: 24 mg/dL — ABNORMAL HIGH (ref 8–23)
CO2: 21 mmol/L — ABNORMAL LOW (ref 22–32)
Calcium: 8.1 mg/dL — ABNORMAL LOW (ref 8.9–10.3)
Chloride: 113 mmol/L — ABNORMAL HIGH (ref 98–111)
Creatinine, Ser: 1.11 mg/dL — ABNORMAL HIGH (ref 0.44–1.00)
GFR, Estimated: 51 mL/min — ABNORMAL LOW (ref >60)
Glucose, Bld: 145 mg/dL — ABNORMAL HIGH (ref 70–99)
Potassium: 3.4 mmol/L — ABNORMAL LOW (ref 3.5–5.1)
Sodium: 139 mmol/L (ref 135–145)

## 2021-01-27 LAB — MAGNESIUM: Magnesium: 2.2 mg/dL (ref 1.7–2.4)

## 2021-01-27 LAB — TSH: TSH: 0.559 u[IU]/mL (ref 0.350–4.500)

## 2021-01-27 LAB — PHOSPHORUS: Phosphorus: 2.7 mg/dL (ref 2.5–4.6)

## 2021-01-27 MED ORDER — ADULT MULTIVITAMIN W/MINERALS CH
1.0000 | ORAL_TABLET | Freq: Every day | ORAL | Status: DC
Start: 2021-01-27 — End: 2021-01-30
  Administered 2021-01-29: 1 via ORAL
  Filled 2021-01-27 (×2): qty 1

## 2021-01-27 MED ORDER — PREDNISONE 10 MG PO TABS
10.0000 mg | ORAL_TABLET | Freq: Every day | ORAL | Status: DC
  Administered 2021-01-29 – 2021-01-30 (×2): 10 mg via ORAL
  Filled 2021-01-27 (×2): qty 1

## 2021-01-27 MED ORDER — POTASSIUM CHLORIDE CRYS ER 20 MEQ PO TBCR: 40.0000 meq | EXTENDED_RELEASE_TABLET | Freq: Once | ORAL | Status: DC

## 2021-01-27 MED ORDER — BOOST / RESOURCE BREEZE PO LIQD CUSTOM
1.0000 | Freq: Three times a day (TID) | ORAL | Status: DC
  Administered 2021-01-27 – 2021-01-30 (×4): 1 via ORAL

## 2021-01-27 MED ORDER — GUAIFENESIN 100 MG/5ML PO LIQD
5.0000 mL | ORAL | Status: DC | PRN
  Administered 2021-01-28 – 2021-01-30 (×3): 5 mL via ORAL
  Filled 2021-01-27 (×4): qty 5

## 2021-01-27 MED ORDER — POTASSIUM CHLORIDE 20 MEQ PO PACK
40.0000 meq | PACK | Freq: Once | ORAL | Status: AC
  Administered 2021-01-27: 12:00:00 40 meq via ORAL
  Filled 2021-01-27: qty 2

## 2021-01-27 MED ORDER — PANTOPRAZOLE SODIUM 40 MG PO TBEC
40.0000 mg | DELAYED_RELEASE_TABLET | Freq: Every day | ORAL | Status: DC
  Administered 2021-01-27: 17:00:00 40 mg via ORAL
  Filled 2021-01-27: qty 1

## 2021-01-27 MED ORDER — CHOLESTYRAMINE 4 G PO PACK
1.0000 | PACK | Freq: Two times a day (BID) | ORAL | Status: DC
  Administered 2021-01-27 – 2021-01-30 (×6): 1 via ORAL
  Filled 2021-01-27 (×8): qty 1

## 2021-01-27 NOTE — Progress Notes (Addendum)
Initial Nutrition Assessment  DOCUMENTATION CODES:   Severe malnutrition in context of chronic illness, Underweight  INTERVENTION:   Boost Breeze po TID, each supplement provides 250 kcal and 9 grams of protein  Vital Cuisine Shake BID, each supplement provides 520 kcal and 22 grams of protein  MVI with minerals daily  NUTRITION DIAGNOSIS:   Severe Malnutrition related to chronic illness (lymphocytic colitis, dysphagia) as evidenced by severe muscle depletion, severe fat depletion.  GOAL:   Patient will meet greater than or equal to 90% of their needs  MONITOR:   PO intake, Supplement acceptance, Diet advancement, Labs  REASON FOR ASSESSMENT:   Consult Assessment of nutrition requirement/status  ASSESSMENT:   75 yo female admitted with hypovolemic shock, weakness, diarrhea, dysphagia. PMH includes lymphocytic colitis, esophageal stricture, HTN, steroid dependence, smoker.  Discussed patient in ICU rounds and with RN today. Patient is now on a full liquid diet. Levophed has been off since yesterday evening.  C. diff negative. GI panel results pending.  Patient reports inability to swallow started approximately 2 weeks ago. She was unable to eat anything and has lost a lot of weight. She is now able to swallow liquids. She drank some coffee at breakfast, and ate a bite of grits and a bite of ice cream. Usually weighs 85 lbs, but was down to 78 lbs on admission. Suspect this weight loss is r/t dehydration with recent poor intake and diarrhea. Diarrhea is suspected to be related to chronic lymphocytic colitis. Patient says everything just runs right through her. She does not like milk. She does like orange sherbet and ice cream. She has tried Boost and Ensure supplements, but they cause diarrhea. She agreed to try Boost Breeze clear liquid supplement between meals. Will also send Vital Cuisine shakes with meals, she agreed to try the vanilla flavor.   Labs reviewed. K 3.4 Phos  2.3 yesterday, up to 2.7 this morning after supplementation  Medications reviewed and include potassium chloride. IVF: D5 1/2NS with 40 mEq KCl at 100 ml/h  Weight history reviewed. Patient weighed 42.4 kg on 09/21/20; currently 39.2 kg. 8% weight loss in 4 months is not significant. Patient is underweight with BMI=18.1.  Patient meets criteria for malnutrition with severe depletion of muscle and subcutaneous fat mass.   NUTRITION - FOCUSED PHYSICAL EXAM:  Flowsheet Row Most Recent Value  Orbital Region Severe depletion  Upper Arm Region Severe depletion  Thoracic and Lumbar Region Severe depletion  Buccal Region Severe depletion  Temple Region Severe depletion  Clavicle Bone Region Severe depletion  Clavicle and Acromion Bone Region Severe depletion  Scapular Bone Region Severe depletion  Dorsal Hand Severe depletion  Patellar Region Severe depletion  Anterior Thigh Region Severe depletion  Posterior Calf Region Severe depletion  Edema (RD Assessment) None  Hair Reviewed  Eyes Reviewed  Mouth Reviewed  Skin Reviewed  Nails Reviewed       Diet Order:   Diet Order             Diet full liquid Room service appropriate? Yes; Fluid consistency: Thin  Diet effective now                   EDUCATION NEEDS:   Not appropriate for education at this time  Skin:  Skin Assessment: Reviewed RN Assessment  Last BM:  11/28 type 7  Height:   Ht Readings from Last 1 Encounters:  01/26/21 4\' 10"  (1.473 m)    Weight:   Wt Readings from  Last 1 Encounters:  01/27/21 39.2 kg    BMI:  Body mass index is 18.06 kg/m.  Estimated Nutritional Needs:   Kcal:  1300-1500  Protein:  60-70 gm  Fluid:  >/= 1.5 L    Lucas Mallow, RD, LDN, CNSC Please refer to Amion for contact information.

## 2021-01-27 NOTE — Consult Note (Signed)
Reason for Consult: Diarrhea and dysphagia Referring Physician: Hospital team  Wanda Valdez is an 75 y.o. female.  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 and is on some prednisone at home although she does not know the dose and today has had some mild stomach pain which started last night and the computer says she is only gone to the bathroom once today and once yesterday but she says it is more than that and she has no other complaints Past Medical History:  Diagnosis Date   Hypertension     Past Surgical History:  Procedure Laterality Date   ABDOMINAL HYSTERECTOMY     BIOPSY  09/11/2020   Procedure: BIOPSY;  Surgeon: [name], MD;  Location: [facility];  Service: Gastroenterology;;   BIOPSY  09/13/2020   Procedure: BIOPSY;  Surgeon: [name], MD;  Location: [facility];  Service: Gastroenterology;;   COLONOSCOPY N/A 09/13/2020   Procedure: COLONOSCOPY;  Surgeon: [name], MD;  Location: [facility];  Service: Gastroenterology;  Laterality: N/A;   ESOPHAGOGASTRODUODENOSCOPY N/A 09/11/2020   Procedure: ESOPHAGOGASTRODUODENOSCOPY (EGD);  Surgeon: [name], MD;  Location: [facility];  Service: Gastroenterology;  Laterality: N/A;   POLYPECTOMY  09/13/2020   Procedure: POLYPECTOMY;  Surgeon: [name], MD;  Location: [facility];  Service: Gastroenterology;;    Family History  Problem Relation Age  of Onset   Cirrhosis Mother    Heart attack Father     Social History:  reports that she has been smoking cigarettes. She has never used smokeless tobacco. She reports current alcohol use. She reports that she does not use drugs.  Allergies: No Known Allergies  Medications: I have reviewed the patient's current medications.  Results for orders placed or performed during the hospital encounter of 01/26/21 (from the past 48 hour(s))  Resp Panel by RT-PCR (Flu A&B, Covid) Nasopharyngeal Swab     Status: None   Collection Time: 01/26/21  3:12 PM   Specimen: Nasopharyngeal Swab; Nasopharyngeal(NP) swabs in vial transport medium  Result Value Ref Range   SARS Coronavirus 2 by RT PCR NEGATIVE NEGATIVE    Comment: (NOTE) SARS-CoV-2 target nucleic acids are NOT DETECTED.  The SARS-CoV-2 RNA is generally detectable in upper respiratory specimens during the acute phase of infection. The lowest concentration of SARS-CoV-2 viral copies this assay can detect is 138 copies/mL. A negative result does not preclude SARS-Cov-2 infection and should not be used as the sole basis for treatment or other patient management decisions. A negative result may occur with  improper specimen collection/handling, submission of specimen other than nasopharyngeal swab, presence of viral mutation(s) within the areas targeted by this assay, and inadequate number of viral copies(<138 copies/mL). A negative result must be combined with clinical observations, patient history, and epidemiological information. The expected result is Negative.  Fact Sheet for Patients:  EntrepreneurPulse.com.au  Fact Sheet for Healthcare Providers:  IncredibleEmployment.be  This test is no  t yet approved or cleared by the [place] and  has been authorized for detection and/or diagnosis of SARS-CoV-2 by FDA under an Emergency Use Authorization (EUA). This EUA will remain  in effect (meaning  this test can be used) for the duration of the COVID-19 declaration under Section 564(b)(1) of the Act, 21 U.S.C.section 360bbb-3(b)(1), unless the authorization is terminated  or revoked sooner.       Influenza A by PCR NEGATIVE NEGATIVE   Influenza B by PCR NEGATIVE NEGATIVE    Comment: (NOTE) The Xpert Xpress SARS-CoV-2/FLU/RSV plus assay is intended as an aid in the diagnosis of influenza from Nasopharyngeal swab specimens and should not be used as a sole basis for treatment. Nasal washings and aspirates are unacceptable for Xpert Xpress SARS-CoV-2/FLU/RSV testing.  Fact Sheet for Patients: EntrepreneurPulse.com.au  Fact Sheet for Healthcare Providers: IncredibleEmployment.be  This test is not yet approved or cleared by the [place] FDA and has been authorized for detection and/or diagnosis of SARS-CoV-2 by FDA under an Emergency Use Authorization (EUA). This EUA will remain in effect (meaning this test can be used) for the duration of the COVID-19 declaration under Section 564(b)(1) of the Act, 21 U.S.C. section 360bbb-3(b)(1), unless the authorization is terminated or revoked.  Performed at [facility], [place] [address], [place], [place] [address]   Urinalysis, Routine w reflex microscopic Urine, Clean Catch     Status: None   Collection Time: 01/26/21  3:12 PM  Result Value Ref Range   Color, Urine YELLOW YELLOW   APPearance CLEAR CLEAR   Specific Gravity, Urine 1.029 1.005 - 1.030   pH 7.0 5.0 - 8.0   Glucose, UA NEGATIVE NEGATIVE mg/dL   Hgb urine dipstick NEGATIVE NEGATIVE   Bilirubin Urine NEGATIVE NEGATIVE   Ketones, ur NEGATIVE NEGATIVE mg/dL   Protein, ur NEGATIVE NEGATIVE mg/dL   Nitrite NEGATIVE NEGATIVE   Leukocytes,Ua NEGATIVE NEGATIVE    Comment: Performed at [facility] [address] [place], [place] [address]  CBC with Differential     Status: Abnormal   Collection Time: 01/26/21  3:37 PM   Result Value Ref Range   WBC 9.1 4.0 - 10.5 K/uL   RBC 2.70 (L) 3.87 - 5.11 MIL/uL   Hemoglobin 9.4 (L) 12.0 - 15.0 g/dL   HCT 27.9 (L) 36.0 - 46.0 %   MCV 103.3 (H) 80.0 - 100.0 fL   MCH 34.8 (H) 26.0 - 34.0 pg   MCHC 33.7 30.0 - 36.0 g/dL   RDW 14.3 11.5 - 15.5 %   Platelets 162 150 - 400 K/uL   nRBC 0.0 0.0 - 0.2 %   Neutrophils Relative % 71 %   Neutro Abs 6.5 1.7 - 7.7 K/uL   Lymphocytes Relative 19 %   Lymphs Abs 1.7 0.7 - 4.0 K/uL   Monocytes Relative 8 %   Monocytes Absolute 0.7 0.1 - 1.0 K/uL   Eosinophils Relative 1 %   Eosinophils Absolute 0.1 0.0 - 0.5 K/uL   Basophils Relative 0 %   Basophils Absolute 0.0 0.0 - 0.1 K/uL   Immature Granulocytes 1 %   Abs Immature Granulocytes 0.10 (H) 0.00 - 0.07 K/uL    Comment: Performed at [facility], [place] [address], [place], [place] [address]  Comprehensive metabolic panel     Status: Abnormal   Collection Time: 01/26/21  3:37 PM  Result Value Ref Range   Sodium 139 135 - 145 mmol/L   Potassium  2.3 (LL) 3.5 - 5.1 mmol/L    Comment: M.BONEY RN @ 1636 01/26/2021 BY C.EDENS   Chloride 108 98 - 111 mmol/L   CO2 21 (L) 22 - 32 mmol/L   Glucose, Bld 136 (H) 70 - 99 mg/dL    Comment: Glucose reference range applies only to samples taken after fasting for at least 8 hours.   BUN 31 (H) 8 - 23 mg/dL   Creatinine, Ser 1.50 (H) 0.44 - 1.00 mg/dL   Calcium 8.1 (L) 8.9 - 10.3 mg/dL   Total Protein 4.1 (L) 6.5 - 8.1 g/dL   Albumin 1.9 (L) 3.5 - 5.0 g/dL   AST 21 15 - 41 U/L   ALT 14 0 - 44 U/L   Alkaline Phosphatase 71 38 - 126 U/L   Total Bilirubin 0.8 0.3 - 1.2 mg/dL   GFR, Estimated 36 (L) >60 mL/min    Comment: (NOTE) Calculated using the CKD-EPI Creatinine Equation (2021)    Anion gap 10 5 - 15    Comment: Performed at [facility], [place] [address] [place], [place] [address]  Lactic acid, plasma     Status: Abnormal   Collection Time: 01/26/21  3:37 PM  Result Value Ref Range   Lactic Acid, Venous 2.9 (HH)  0.5 - 1.9 mmol/L    Comment: CRITICAL RESULT CALLED TO, READ BACK BY AND VERIFIED WITH: M.BONEY RN @ [id number] 01/26/2021 BY C.EDENS Performed at [facility] [address], [place], [place] [address]   Protime-INR     Status: None   Collection Time: 01/26/21  3:37 PM  Result Value Ref Range   Prothrombin Time 13.9 11.4 - 15.2 seconds   INR 1.1 0.8 - 1.2    Comment: (NOTE) INR goal varies based on device and disease states. Performed at [facility], [place] [address] [place], [place] [address]   APTT     Status: None   Collection Time: 01/26/21  3:37 PM  Result Value Ref Range   aPTT 28 24 - 36 seconds    Comment: Performed at [facility] [address], [place], [place] [address]  Magnesium     Status: Abnormal   Collection Time: 01/26/21  3:37 PM  Result Value Ref Range   Magnesium 1.5 (L) 1.7 - 2.4 mg/dL    Comment: Performed at [facility] [address], [place], [place] [address]  Blood culture (routine x 2)     Status: None (Preliminary result)   Collection Time: 01/26/21  3:45 PM   Specimen: BLOOD  Result Value Ref Range   Specimen Description BLOOD SITE NOT SPECIFIED    Special Requests      BOTTLES DRAWN AEROBIC AND ANAEROBIC Blood Culture adequate volume   Culture      NO GROWTH < 12 HOURS Performed at [facility], [place] [address], [place], [place] [address]    Report Status PENDING   Blood culture (routine x 2)     Status: None (Preliminary result)   Collection Time: 01/26/21  3:50 PM   Specimen: BLOOD RIGHT FOREARM  Result Value Ref Range   Specimen Description BLOOD RIGHT FOREARM    Special Requests      BOTTLES DRAWN AEROBIC ONLY Blood Culture results may not be optimal due to an inadequate volume of blood received in culture bottles   Culture      NO GROWTH < 12 HOURS Performed at [facility],  [address] [place], [place] [address]    Report Status PENDING   I-stat chem 8, ED (not at [facility] or [facility])     Status:  Abnormal   Collection Time: 01/26/21  3:53 PM  Result Value Ref Range   Sodium 141 135 - 145 mmol/L   Potassium 2.2 (LL) 3.5 - 5.1 mmol/L   Chloride 105 98 - 111 mmol/L   BUN 29 (H) 8 - 23 mg/dL   Creatinine, Ser 1.50 (H) 0.44 - 1.00 mg/dL   Glucose, Bld 125 (H) 70 - 99 mg/dL    Comment: Glucose reference range applies only to samples taken after fasting for at least 8 hours.   Calcium, Ion 1.18 1.15 - 1.40 mmol/L   TCO2 22 22 - 32 mmol/L   Hemoglobin 9.5 (L) 12.0 - 15.0 g/dL   HCT 28.0 (L) 36.0 - 46.0 %   Comment NOTIFIED PHYSICIAN   C Difficile Quick Screen w PCR reflex     Status: None   Collection Time: 01/26/21  4:00 PM   Specimen: STOOL  Result Value Ref Range   C Diff antigen NEGATIVE NEGATIVE   C Diff toxin NEGATIVE NEGATIVE   C Diff interpretation No C. difficile detected.     Comment: Performed at [facility], [place] [address] [place], [place] [address]  Lactic acid, plasma     Status: None   Collection Time: 01/26/21  5:12 PM  Result Value Ref Range   Lactic Acid, Venous 1.5 0.5 - 1.9 mmol/L    Comment: Performed at [facility] [address], [place], [place] [address]  Cortisol, Random     Status: None   Collection Time: 01/26/21  5:31 PM  Result Value Ref Range   Cortisol, Plasma 24.1 ug/dL    Comment: (NOTE) AM    6.7 - 22.6 ug/dL PM   <10.0       ug/dL Performed at [facility] [address] [place], [place] [address]   MRSA Next Gen by PCR, Nasal     Status: None   Collection Time: 01/26/21  6:38 PM   Specimen: Nasal Mucosa; Nasal Swab  Result Value Ref Range   MRSA by PCR Next Gen NOT DETECTED NOT DETECTED    Comment: (NOTE) The GeneXpert MRSA Assay (FDA approved for NASAL specimens only), is one component of a comprehensive MRSA colonization surveillance program. It is not intended to diagnose MRSA infection nor to guide or monitor treatment for MRSA infections. Test performance is not FDA approved in patients less than 83  years old. Performed at [facility], [place] [address], [place], [place] [id number]   Basic metabolic panel     Status: Abnormal   Collection Time: 01/26/21  8:56 PM  Result Value Ref Range   Sodium 136 135 - 145 mmol/L   Potassium 3.2 (L) 3.5 - 5.1 mmol/L    Comment: DELTA CHECK NOTED   Chloride 110 98 - 111 mmol/L   CO2 17 (L) 22 - 32 mmol/L   Glucose, Bld 129 (H) 70 - 99 mg/dL    Comment: Glucose reference range applies only to samples taken after fasting for at least 8 hours.   BUN 27 (H) 8 - 23 mg/dL   Creatinine, Ser 1.23 (H) 0.44 - 1.00 mg/dL   Calcium 8.3 (L) 8.9 - 10.3 mg/dL   GFR, Estimated 45 (L) >60 mL/min    Comment: (NOTE) Calculated using the CKD-EPI Creatinine Equation (  2021)    Anion gap 9 5 - 15    Comment: Performed at [facility], [place] [address] [place], [place] [address]  Magnesium     Status: None   Collection Time: 01/26/21  8:56 PM  Result Value Ref Range   Magnesium QUANTITY NOT SUFFICIENT, UNABLE TO PERFORM TEST 1.7 - 2.4 mg/dL    Comment: Performed at [facility] [address] [place], [place] [address]  Phosphorus     Status: Abnormal   Collection Time: 01/26/21  8:56 PM  Result Value Ref Range   Phosphorus 2.3 (L) 2.5 - 4.6 mg/dL    Comment: Performed at [facility] [address] [place], [place] [address]  CBC     Status: Abnormal   Collection Time: 01/26/21  8:56 PM  Result Value Ref Range   WBC 9.3 4.0 - 10.5 K/uL   RBC 2.91 (L) 3.87 - 5.11 MIL/uL   Hemoglobin 10.0 (L) 12.0 - 15.0 g/dL   HCT 29.7 (L) 36.0 - 46.0 %   MCV 102.1 (H) 80.0 - 100.0 fL   MCH 34.4 (H) 26.0 - 34.0 pg   MCHC 33.7 30.0 - 36.0 g/dL   RDW 14.3 11.5 - 15.5 %   Platelets 160 150 - 400 K/uL   nRBC 0.0 0.0 - 0.2 %    Comment: Performed at [facility] [address] [place], [place] [address]  CBC     Status: Abnormal   Collection Time: 01/27/21  2:33 AM  Result Value Ref Range   WBC 7.8 4.0 - 10.5 K/uL   RBC 2.76 (L) 3.87 -  5.11 MIL/uL   Hemoglobin 9.8 (L) 12.0 - 15.0 g/dL   HCT 28.0 (L) 36.0 - 46.0 %   MCV 101.4 (H) 80.0 - 100.0 fL   MCH 35.5 (H) 26.0 - 34.0 pg   MCHC 35.0 30.0 - 36.0 g/dL   RDW 14.3 11.5 - 15.5 %   Platelets 171 150 - 400 K/uL   nRBC 0.0 0.0 - 0.2 %    Comment: Performed at [facility], [place] [address] [place], [place] [address]  Magnesium     Status: None   Collection Time: 01/27/21  2:33 AM  Result Value Ref Range   Magnesium 2.2 1.7 - 2.4 mg/dL    Comment: Performed at [facility], [place] [address] [place], [place] [id number]  Basic metabolic panel     Status: Abnormal   Collection Time: 01/27/21  2:33 AM  Result Value Ref Range   Sodium 139 135 - 145 mmol/L   Potassium 3.4 (L) 3.5 - 5.1 mmol/L   Chloride 113 (H) 98 - 111 mmol/L   CO2 21 (L) 22 - 32 mmol/L   Glucose, Bld 145 (H) 70 - 99 mg/dL    Comment: Glucose reference range applies only to samples taken after fasting for at least 8 hours.   BUN 24 (H) 8 - 23 mg/dL   Creatinine, Ser 1.11 (H) 0.44 - 1.00 mg/dL   Calcium 8.1 (L) 8.9 - 10.3 mg/dL   GFR, Estimated 51 (L) >60 mL/min    Comment: (NOTE) Calculated using the CKD-EPI Creatinine Equation (2021)    Anion gap 5 5 - 15    Comment: Performed at [facility] [address] [place], [place] [address]  Phosphorus     Status: None   Collection Time: 01/27/21  2:33 AM  Result Value Ref  Range   Phosphorus 2.7 2.5 - 4.6 mg/dL    Comment: Performed at [facility], [address], [place], [place] [address]    DG Chest Portable 1 View  Result Date: 01/26/2021 CLINICAL DATA:  Weakness EXAM: PORTABLE CHEST 1 VIEW COMPARISON:  Chest x-ray 09/19/2020 FINDINGS: Cardiomediastinal silhouette is unchanged with tortuous ectatic thoracic aorta calcified plaques. No focal consolidation identified. No pleural effusion or pneumothorax. IMPRESSION: No acute intrathoracic process identified. Electronically Signed   By: Jannifer Hick M.D.   On: 01/26/2021  15:46    ROS negative except above Blood pressure 123/75, pulse 62, temperature 97.9 F (36.6 C), temperature source Oral, resp. rate 18, height 4\' 10"  (1.473 m), weight 39.2 kg, SpO2 100 %. 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

## 2021-01-27 NOTE — Progress Notes (Signed)
NAME:  Wanda Valdez, MRN:  [id number], DOB:  08-Apr-1945, LOS: 1 ADMISSION DATE:  01/26/2021, CONSULTATION DATE:  11/27 REFERRING MD:  Junious Silk, CHIEF COMPLAINT:  Weakness   History of Present Illness:  Wanda Valdez is a 75 y.o. F who presented to [facility] ED complaining of profound weakness and no PO intake as well as diarrhea. She had been having trouble swallowing solid foods over the previous 4 days but able to take medications. She also had gradually worsening diarrhea. She received 3.5L LR in ED and started on levophed. PCCM was consulted for admission for hypovolemic shock.  Pertinent  Medical History  Lymphocytic colitis, hypertension, steroid dependence  Significant Hospital Events: Including procedures, antibiotic start and stop dates in addition to other pertinent events   11/27 Admitted   Interim History / Subjective:  Patient reports feeling markedly better this morning though is still having diarrhea. She is tolerating breakfast well.   Objective   Blood pressure 123/75, pulse 62, temperature 97.9 F (36.6 C), temperature source Oral, resp. rate 18, height 4\' 10"  (1.473 m), weight 39.2 kg, SpO2 100 %.        Intake/Output Summary (Last 24 hours) at 01/27/2021 0916 Last data filed at 01/27/2021 0700 Gross per 24 hour  Intake 5061.52 ml  Output 200 ml  Net 4861.52 ml   Filed Weights   01/26/21 1534 01/27/21 0500  Weight: 35.4 kg 39.2 kg    Examination: Constitutional: Elderly woman enjoying breakfast in bed, no acute distress. Cardio: Regular rate and rhythm. No murmurs, rubs, gallops. Pulm: Clear to auscultation bilaterally. Abdomen: Soft, non-tender, non-distended MSK: No edema noted. Skin: Warm and dry. Neuro: Alert and oriented x3. No focal deficit noted. Psych: Appropriate mood and affect.  Resolved Hospital Problem list   Hypovolemic shock  Assessment & Plan:  Hypovolemic shock 2/2 no PO intake and diarrhea - Continue D5 1/2 NS - No longer  requiring Levophed  Esophageal stricture Tolerating diet this morning. - Consider GI consult; consider endoscopy this admission?  Lymphocytic diarrhea flare Patient reports still having diarrhea this morning. Cortisol level normal at 24.1.  Macrocytic anemia Hgb stable at 9.8. Methylmalonic acid, TSH pending. - Monitor CBC  AKI In setting of hypovolemic shock. Cr improved to 1.11 from 1.23. - Fluid resuscitation as above - Replete electrolytes as needed - Trend CMP  Best Practice (right click and "Reselect all SmartList Selections" daily)   Diet/type: full liquids  DVT prophylaxis: prophylactic heparin  GI prophylaxis: N/A Lines: N/A Foley:  N/A Code Status:  full code Last date of multidisciplinary goals of care discussion [11/27]  Labs   CBC: Recent Labs  Lab 01/26/21 1537 01/26/21 1553 01/26/21 2056 01/27/21 0233  WBC 9.1  --  9.3 7.8  NEUTROABS 6.5  --   --   --   HGB 9.4* 9.5* 10.0* 9.8*  HCT 27.9* 28.0* 29.7* 28.0*  MCV 103.3*  --  102.1* 101.4*  PLT 162  --  160 [id number]    Basic Metabolic Panel: Recent Labs  Lab 01/26/21 1537 01/26/21 1553 01/26/21 2056 01/27/21 0233  NA 139 141 136 139  K 2.3* 2.2* 3.2* 3.4*  CL 108 105 110 113*  CO2 21*  --  17* 21*  GLUCOSE 136* 125* 129* 145*  BUN 31* 29* 27* 24*  CREATININE 1.50* 1.50* 1.23* 1.11*  CALCIUM 8.1*  --  8.3* 8.1*  MG 1.5*  --  QUANTITY NOT SUFFICIENT, UNABLE TO PERFORM TEST 2.2  PHOS  --   --  2.3* 2.7   GFR: Estimated Creatinine Clearance: 26.3 mL/min (A) (by C-G formula based on SCr of 1.11 mg/dL (H)). Recent Labs  Lab 01/26/21 1537 01/26/21 1712 01/26/21 2056 01/27/21 0233  WBC 9.1  --  9.3 7.8  LATICACIDVEN 2.9* 1.5  --   --     Liver Function Tests: Recent Labs  Lab 01/26/21 1537  AST 21  ALT 14  ALKPHOS 71  BILITOT 0.8  PROT 4.1*  ALBUMIN 1.9*   No results for input(s): LIPASE, AMYLASE in the last 168 hours. No results for input(s): AMMONIA in the last 168  hours.  ABG    Component Value Date/Time   TCO2 22 01/26/2021 1553     Coagulation Profile: Recent Labs  Lab 01/26/21 1537  INR 1.1    Cardiac Enzymes: No results for input(s): CKTOTAL, CKMB, CKMBINDEX, TROPONINI in the last 168 hours.  HbA1C: No results found for: HGBA1C  CBG: No results for input(s): GLUCAP in the last 168 hours.  Review of Systems:   Review of Systems  Constitutional:  Negative for chills and malaise/fatigue.  Respiratory:  Positive for cough. Negative for shortness of breath.   Cardiovascular:  Negative for chest pain.  Gastrointestinal:  Positive for diarrhea. Negative for abdominal pain.  Neurological:  Negative for headaches.    Past Medical History:  She,  has a past medical history of Hypertension.   Surgical History:   Past Surgical History:  Procedure Laterality Date   ABDOMINAL HYSTERECTOMY     BIOPSY  09/11/2020   Procedure: BIOPSY;  Surgeon: [name], MD;  Location: [facility];  Service: Gastroenterology;;   BIOPSY  09/13/2020   Procedure: BIOPSY;  Surgeon: [name], MD;  Location: [facility];  Service: Gastroenterology;;   COLONOSCOPY N/A 09/13/2020   Procedure: COLONOSCOPY;  Surgeon: [name], MD;  Location: [facility];  Service: Gastroenterology;  Laterality: N/A;   ESOPHAGOGASTRODUODENOSCOPY N/A 09/11/2020   Procedure: ESOPHAGOGASTRODUODENOSCOPY (EGD);  Surgeon: [name], MD;  Location: [facility];  Service: Gastroenterology;  Laterality: N/A;   POLYPECTOMY  09/13/2020   Procedure: POLYPECTOMY;  Surgeon: [name], MD;  Location: [facility];  Service: Gastroenterology;;     Social History:   reports that she has been smoking cigarettes. She has never used smokeless tobacco. She reports current alcohol use. She reports that she does not use drugs.   Family History:  Her family history includes Cirrhosis in her mother; Heart attack in her father.   Allergies No Known Allergies    Home Medications  Prior to Admission medications   Medication Sig Start Date End Date Taking? Authorizing Provider  triamcinolone acetonide (TRIESENCE) 40 MG/ML SUSP Inject into the articular space. 12/24/20  Yes [provider]  alendronate (FOSAMAX) 35 MG tablet Take 35 mg by mouth once a week. 03/28/20   [provider]  allopurinol (ZYLOPRIM) 100 MG tablet Take 100 mg by mouth daily. 08/10/20   [provider]  atorvastatin (LIPITOR) 20 MG tablet Take 20 mg by mouth daily. 10/01/20   [provider]  cholestyramine (QUESTRAN) 4 g packet Take 1 packet by mouth 2 (two) times daily. 10/10/20   [provider]  cholestyramine light (PREVALITE) 4 g packet Take 1 packet (4 g total) by mouth 2 (two) times daily. 09/22/20   Shawna Clamp, MD  CREON 6000-19000 units CPEP Take 2 capsules by mouth 3 (three) times daily. 05/11/20   [provider]  diphenoxylate-atropine (LOMOTIL) 2.5-0.025 MG tablet Take 1 tablet by mouth  2 (two) times daily as needed. 12/16/20   [provider]  EPINEPHrine 0.3 mg/0.3 mL IJ SOAJ injection SMARTSIG:0.3 Milliliter(s) IM Once PRN 10/01/20   [provider]  famotidine (PEPCID) 40 MG tablet Take 40 mg by mouth daily. 10/01/20   [provider]  furosemide (LASIX) 20 MG tablet Take 20 mg by mouth daily.    [provider]  lisinopril-hydrochlorothiazide (ZESTORETIC) 20-12.5 MG tablet Take 1 tablet by mouth 2 (two) times daily. 10/01/20   [provider]  metoprolol tartrate (LOPRESSOR) 25 MG tablet Take 25 mg by mouth daily. 01/01/21   [provider]  Multiple Vitamin (MULTIVITAMIN WITH MINERALS) TABS tablet Take 1 tablet by mouth daily. 09/14/20   Pahwani, Kasandra Knudsen, MD  omeprazole (PRILOSEC) 40 MG capsule Take 40 mg by mouth daily. 10/15/20   [provider]  pantoprazole (PROTONIX) 40 MG tablet Take 1 tablet (40 mg total) by mouth daily. 09/14/20   Pahwani, Kasandra Knudsen, MD   potassium chloride SA (KLOR-CON) 20 MEQ tablet Take 20 mEq by mouth daily. 07/19/20   [provider]  predniSONE (DELTASONE) 10 MG tablet Take 10 mg by mouth daily with breakfast.    [provider]  sucralfate (CARAFATE) 1 g tablet Take 1 g by mouth 3 (three) times daily. Dissolve 1 tablet in 2 ounces of water 05/27/20   [provider]     Critical care time: 35 minutes

## 2021-01-27 NOTE — Plan of Care (Signed)
  Problem: Education: Goal: Knowledge of General Education information will improve Description: Including pain rating scale, medication(s)/side effects and non-pharmacologic comfort measures Outcome: Progressing   Problem: Health Behavior/Discharge Planning: Goal: Ability to manage health-related needs will improve Outcome: Progressing   Problem: Clinical Measurements: Goal: Ability to maintain clinical measurements within normal limits will improve Outcome: Progressing Goal: Will remain free from infection Outcome: Progressing Goal: Diagnostic test results will improve Outcome: Progressing Goal: Respiratory complications will improve Outcome: Progressing Goal: Cardiovascular complication will be avoided Outcome: Progressing   Problem: Activity: Goal: Risk for activity intolerance will decrease Outcome: Progressing   Problem: Coping: Goal: Level of anxiety will decrease Outcome: Progressing   Problem: Elimination: Goal: Will not experience complications related to bowel motility Outcome: Progressing Goal: Will not experience complications related to urinary retention Outcome: Progressing   Problem: Pain Managment: Goal: General experience of comfort will improve Outcome: Progressing   Problem: Safety: Goal: Ability to remain free from injury will improve Outcome: Progressing   Problem: Nutrition: Goal: Adequate nutrition will be maintained Outcome: Not Progressing Note: Poor appetite.   Problem: Skin Integrity: Goal: Risk for impaired skin integrity will decrease Outcome: Not Progressing Note: Continues to be incontinent of bowel and bladder.

## 2021-01-27 NOTE — Progress Notes (Signed)
eLink Physician-Brief Progress Note Patient Name: Wanda Valdez DOB: Jan 14, 1946 MRN: [id number]   Date of Service  01/27/2021  HPI/Events of Note  K replaced  eICU Interventions       Intervention Category Minor Interventions: Electrolytes abnormality - evaluation and management  Jacinta Shoe 01/27/2021, 6:06 AM

## 2021-01-27 NOTE — Progress Notes (Signed)
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

## 2021-01-28 ENCOUNTER — Inpatient Hospital Stay (HOSPITAL_COMMUNITY): Payer: Medicare Other

## 2021-01-28 DIAGNOSIS — R197 Diarrhea, unspecified: Secondary | ICD-10-CM | POA: Diagnosis not present

## 2021-01-28 DIAGNOSIS — R571 Hypovolemic shock: Secondary | ICD-10-CM | POA: Diagnosis not present

## 2021-01-28 DIAGNOSIS — Z7952 Long term (current) use of systemic steroids: Secondary | ICD-10-CM | POA: Diagnosis not present

## 2021-01-28 DIAGNOSIS — R1310 Dysphagia, unspecified: Secondary | ICD-10-CM | POA: Diagnosis not present

## 2021-01-28 LAB — COMPREHENSIVE METABOLIC PANEL
ALT: 14 U/L (ref 0–44)
ALT: 27 U/L (ref 0–44)
AST: 21 U/L (ref 15–41)
AST: 33 U/L (ref 15–41)
Albumin: 1.9 g/dL — ABNORMAL LOW (ref 3.5–5.0)
Albumin: 2 g/dL — ABNORMAL LOW (ref 3.5–5.0)
Alkaline Phosphatase: 71 U/L (ref 38–126)
Alkaline Phosphatase: 98 U/L (ref 38–126)
Anion gap: 10 (ref 5–15)
Anion gap: 3 — ABNORMAL LOW (ref 5–15)
BUN: 31 mg/dL — ABNORMAL HIGH (ref 8–23)
BUN: 12 mg/dL (ref 8–23)
CO2: 21 mmol/L — ABNORMAL LOW (ref 22–32)
CO2: 14 mmol/L — ABNORMAL LOW (ref 22–32)
Calcium: 8.1 mg/dL — ABNORMAL LOW (ref 8.9–10.3)
Calcium: 7.7 mg/dL — ABNORMAL LOW (ref 8.9–10.3)
Chloride: 108 mmol/L (ref 98–111)
Chloride: 117 mmol/L — ABNORMAL HIGH (ref 98–111)
Creatinine, Ser: 1.5 mg/dL — ABNORMAL HIGH (ref 0.44–1.00)
Creatinine, Ser: 0.91 mg/dL (ref 0.44–1.00)
GFR, Estimated: 36 mL/min — ABNORMAL LOW (ref >60)
GFR, Estimated: >60 mL/min (ref >60)
Glucose, Bld: 136 mg/dL — ABNORMAL HIGH (ref 70–99)
Glucose, Bld: 101 mg/dL — ABNORMAL HIGH (ref 70–99)
Potassium: 2.3 mmol/L — CL (ref 3.5–5.1)
Potassium: 6.1 mmol/L — ABNORMAL HIGH (ref 3.5–5.1)
Sodium: 139 mmol/L (ref 135–145)
Sodium: 134 mmol/L — ABNORMAL LOW (ref 135–145)
Total Bilirubin: 0.8 mg/dL (ref 0.3–1.2)
Total Bilirubin: 0.6 mg/dL (ref 0.3–1.2)
Total Protein: 4.1 g/dL — ABNORMAL LOW (ref 6.5–8.1)
Total Protein: 4.7 g/dL — ABNORMAL LOW (ref 6.5–8.1)

## 2021-01-28 LAB — CBC
HCT: 32.2 % — ABNORMAL LOW (ref 36.0–46.0)
Hemoglobin: 10.6 g/dL — ABNORMAL LOW (ref 12.0–15.0)
MCH: 34.4 pg — ABNORMAL HIGH (ref 26.0–34.0)
MCHC: 32.9 g/dL (ref 30.0–36.0)
MCV: 104.5 fL — ABNORMAL HIGH (ref 80.0–100.0)
Platelets: 172 10*3/uL (ref 150–400)
RBC: 3.08 MIL/uL — ABNORMAL LOW (ref 3.87–5.11)
RDW: 14.5 % (ref 11.5–15.5)
WBC: 7.5 10*3/uL (ref 4.0–10.5)
nRBC: 0 % (ref 0.0–0.2)

## 2021-01-28 LAB — MAGNESIUM: Magnesium: 1.5 mg/dL — ABNORMAL LOW (ref 1.7–2.4)

## 2021-01-28 LAB — PHOSPHORUS: Phosphorus: 1.3 mg/dL — ABNORMAL LOW (ref 2.5–4.6)

## 2021-01-28 LAB — BASIC METABOLIC PANEL
Anion gap: 7 (ref 5–15)
BUN: 10 mg/dL (ref 8–23)
CO2: 14 mmol/L — ABNORMAL LOW (ref 22–32)
Calcium: 7.9 mg/dL — ABNORMAL LOW (ref 8.9–10.3)
Chloride: 114 mmol/L — ABNORMAL HIGH (ref 98–111)
Creatinine, Ser: 0.92 mg/dL (ref 0.44–1.00)
GFR, Estimated: >60 mL/min (ref >60)
Glucose, Bld: 82 mg/dL (ref 70–99)
Potassium: 5 mmol/L (ref 3.5–5.1)
Sodium: 135 mmol/L (ref 135–145)

## 2021-01-28 MED ORDER — SODIUM BICARBONATE 8.4 % IV SOLN
INTRAVENOUS | Status: DC
  Filled 2021-01-28 (×6): qty 1000

## 2021-01-28 MED ORDER — DEXTROSE 50 % IV SOLN
25.0000 mL | Freq: Once | INTRAVENOUS | Status: AC
  Administered 2021-01-28: 25 mL via INTRAVENOUS

## 2021-01-28 MED ORDER — PANTOPRAZOLE SODIUM 40 MG IV SOLR
40.0000 mg | INTRAVENOUS | Status: DC
  Administered 2021-01-28 – 2021-01-29 (×2): 40 mg via INTRAVENOUS
  Filled 2021-01-28: qty 40

## 2021-01-28 MED ORDER — SODIUM CHLORIDE 0.9 % IV SOLN: INTRAVENOUS | Status: DC

## 2021-01-28 MED ORDER — MAGNESIUM SULFATE 2 GM/50ML IV SOLN
2.0000 g | Freq: Once | INTRAVENOUS | Status: AC
  Administered 2021-01-28: 2 g via INTRAVENOUS
  Filled 2021-01-28: qty 50

## 2021-01-28 MED ORDER — INSULIN ASPART 100 UNIT/ML IV SOLN
6.0000 [IU] | Freq: Once | INTRAVENOUS | Status: AC
  Administered 2021-01-28: 6 [IU] via INTRAVENOUS

## 2021-01-28 MED ORDER — SODIUM ZIRCONIUM CYCLOSILICATE 5 G PO PACK
5.0000 g | PACK | Freq: Every day | ORAL | Status: AC
  Administered 2021-01-28: 5 g via ORAL
  Filled 2021-01-28: qty 1

## 2021-01-28 MED ORDER — SODIUM PHOSPHATES 45 MMOLE/15ML IV SOLN
15.0000 mmol | Freq: Once | INTRAVENOUS | Status: AC
  Administered 2021-01-28: 15 mmol via INTRAVENOUS
  Filled 2021-01-28: qty 5

## 2021-01-28 NOTE — Progress Notes (Signed)
PCCM will sign off  Call if needed

## 2021-01-28 NOTE — Progress Notes (Addendum)
PROGRESS NOTE        PATIENT DETAILS Name: Wanda Valdez Age: 75 y.o. Sex: female Date of Birth: 12/12/1945 Admit Date: 01/26/2021 Admitting Physician Juanito Doom, MD [id number], Elenore Rota, PA-C  Brief Narrative: Patient is a 74 y.o. 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  Upon stability-she was [facility] on [facility]/29.  Subjective: Continues to have solid food dysphagia-able to take some meds and liquids.  Diarrhea has slowed down but claims she had 2 loose stools earlier this morning.  Objective: Vitals: Blood pressure 136/85, pulse 68, temperature 97.9 F (36.6 C), temperature source Oral, resp. rate 18, height 4\' 10"  (1.473 m), weight 39.2 kg, SpO2 (!) 72 %.   Exam: Gen Exam:Alert awake-not in any distress HEENT:atraumatic, normocephalic Chest: B/L clear to auscultation anteriorly CVS:S1S2 regular Abdomen:soft non tender, non distended Extremities:no edema Neurology: Non focal Skin: no rash  Pertinent Labs/Radiology: Recent Labs  Lab 01/26/21 1537 01/26/21 1553 01/28/21 0848  WBC 9.1   < > 7.5  HGB 9.4*   < > 10.6*  PLT 162   < > 172  NA 139   < > 134*  K 2.3*   < > 6.1*  CREATININE 1.50*   < > 0.91  AST 21  --  33  ALT 14  --  27  ALKPHOS 71  --  98  BILITOT 0.8  --  0.6   < > = values in this interval not displayed.     Assessment/Plan: Diarrhea: Likely due to flare of her lymphocytic-stool studies for C. difficile/GI pathogen panel negative-diarrhea slowing down-GI following-on regular dosing of steroids.  Hypovolemic shock: Resolved-due to diarrhea and poor oral intake.  Dysphagia: Barium esophagogram pending-GI following for possible endoscopic evaluation.  AKI: Hemodynamically mediated-in the setting of volume  loss from diarrhea and poor oral intake-resolved-creatinine back to baseline.  Hyperkalemia: Stop all potassium supplementation-1 dose of IV insulin/dextrose and Lokelma-recheck electrolytes later today.  Non anion gap metabolic acidosis: Likely due to diarrhea-we will start IVF with bicarb (will help with elevated potassium as well)  Macrocytic anemia: Vitamin B12 level stable-check folate with a.m. labs-and monitor closely.  Hypophosphatemia: Replete and recheck  Hypomagnesemia: Replete and recheck  Debility/deconditioning: Due to acute illness-obtaining PT/OT  Nutrition Status: Nutrition Problem: Severe Malnutrition Etiology: chronic illness (lymphocytic colitis, dysphagia) Signs/Symptoms: severe muscle depletion, severe fat depletion Interventions: Boost Breeze, MVI, Hormel Shake  Underweight: Estimated body mass index is 18.06 kg/m as calculated from the following:   Height as of this encounter: 4\' 10"  (1.473 m).   Weight as of this encounter: 39.2 kg.   Procedures: None Consults: GI,PCCM DVT Prophylaxis: SQ heparin Code Status:Full code  Family Communication: None at bedside  Time spent: 35 minutes-Greater than 50% of this time was spent in counseling, explanation of diagnosis, planning of further management, and coordination of care.   Disposition Plan: Status is: Inpatient  Remains inpatient appropriate because: Resolving hypovolemic shock-dysphagia work-up in progress-severely hypokalemic today.    Diet: Diet Order             Diet full liquid Room service appropriate? Yes; Fluid consistency: Thin  Diet effective now  Antimicrobial agents: Anti-infectives (From admission, onward)    None        MEDICATIONS: Scheduled Meds:  Chlorhexidine Gluconate Cloth  6 each Topical Daily   cholestyramine  1 packet Oral BID   insulin aspart  6 Units Intravenous Once   And   dextrose  25 mL Intravenous Once   feeding supplement  1  Container Oral TID BM   heparin  5,000 Units Subcutaneous Q8H   multivitamin with minerals  1 tablet Oral Daily   pantoprazole  40 mg Oral Daily   predniSONE  10 mg Oral Q breakfast   sodium zirconium cyclosilicate  5 g Oral Daily   Continuous Infusions:  sodium chloride 10 mL/hr at 01/28/21 0318   magnesium sulfate bolus IVPB     sodium bicarbonate 150 mEq in D5W infusion     sodium phosphate  Dextrose 5% IVPB     PRN Meds:.docusate sodium, guaiFENesin, ondansetron (ZOFRAN) IV   I have personally reviewed following labs and imaging studies  LABORATORY DATA: CBC: Recent Labs  Lab 01/26/21 1537 01/26/21 1553 01/26/21 2056 01/27/21 0233 01/28/21 0848  WBC 9.1  --  9.3 7.8 7.5  NEUTROABS 6.5  --   --   --   --   HGB 9.4* 9.5* 10.0* 9.8* 10.6*  HCT 27.9* 28.0* 29.7* 28.0* 32.2*  MCV 103.3*  --  102.1* 101.4* 104.5*  PLT 162  --  160 171 172    Basic Metabolic Panel: Recent Labs  Lab 01/26/21 1537 01/26/21 1553 01/26/21 2056 01/27/21 0233 01/28/21 0848  NA 139 141 136 139 134*  K 2.3* 2.2* 3.2* 3.4* 6.1*  CL 108 105 110 113* 117*  CO2 21*  --  17* 21* 14*  GLUCOSE 136* 125* 129* 145* 101*  BUN 31* 29* 27* 24* 12  CREATININE 1.50* 1.50* 1.23* 1.11* 0.91  CALCIUM 8.1*  --  8.3* 8.1* 7.7*  MG 1.5*  --  QUANTITY NOT SUFFICIENT, UNABLE TO PERFORM TEST 2.2 1.5*  PHOS  --   --  2.3* 2.7 1.3*    GFR: Estimated Creatinine Clearance: 32 mL/min (by C-G formula based on SCr of 0.91 mg/dL).  Liver Function Tests: Recent Labs  Lab 01/26/21 1537 01/28/21 0848  AST 21 33  ALT 14 27  ALKPHOS 71 98  BILITOT 0.8 0.6  PROT 4.1* 4.7*  ALBUMIN 1.9* 2.0*   No results for input(s): LIPASE, AMYLASE in the last 168 hours. No results for input(s): AMMONIA in the last 168 hours.  Coagulation Profile: Recent Labs  Lab 01/26/21 1537  INR 1.1    Cardiac Enzymes: No results for input(s): CKTOTAL, CKMB, CKMBINDEX, TROPONINI in the last 168 hours.  BNP (last 3 results) No  results for input(s): PROBNP in the last 8760 hours.  Lipid Profile: No results for input(s): CHOL, HDL, LDLCALC, TRIG, CHOLHDL, LDLDIRECT in the last 72 hours.  Thyroid Function Tests: Recent Labs    01/27/21 1712  TSH 0.559    Anemia Panel: No results for input(s): VITAMINB12, FOLATE, FERRITIN, TIBC, IRON, RETICCTPCT in the last 72 hours.  Urine analysis:    Component Value Date/Time   COLORURINE YELLOW 01/26/2021 1512   APPEARANCEUR CLEAR 01/26/2021 1512   LABSPEC 1.029 01/26/2021 1512   PHURINE 7.0 01/26/2021 1512   GLUCOSEU NEGATIVE 01/26/2021 1512   HGBUR NEGATIVE 01/26/2021 1512   BILIRUBINUR NEGATIVE 01/26/2021 1512   KETONESUR NEGATIVE 01/26/2021 1512   PROTEINUR NEGATIVE 01/26/2021 1512   NITRITE NEGATIVE 01/26/2021 1512  LEUKOCYTESUR NEGATIVE 01/26/2021 1512    Sepsis Labs: Lactic Acid, Venous    Component Value Date/Time   LATICACIDVEN 1.5 01/26/2021 1712    MICROBIOLOGY: Recent Results (from the past 240 hour(s))  Resp Panel by RT-PCR (Flu A&B, Covid) Nasopharyngeal Swab     Status: None   Collection Time: 01/26/21  3:12 PM   Specimen: Nasopharyngeal Swab; Nasopharyngeal(NP) swabs in vial transport medium  Result Value Ref Range Status   SARS Coronavirus 2 by RT PCR NEGATIVE NEGATIVE Final    Comment: (NOTE) SARS-CoV-2 target nucleic acids are NOT DETECTED.  The SARS-CoV-2 RNA is generally detectable in upper respiratory specimens during the acute phase of infection. The lowest concentration of SARS-CoV-2 viral copies this assay can detect is 138 copies/mL. A negative result does not preclude SARS-Cov-2 infection and should not be used as the sole basis for treatment or other patient management decisions. A negative result may occur with  improper specimen collection/handling, submission of specimen other than nasopharyngeal swab, presence of viral mutation(s) within the areas targeted by this assay, and inadequate number of viral copies(<138  copies/mL). A negative result must be combined with clinical observations, patient history, and epidemiological information. The expected result is Negative.  Fact Sheet for Patients:  EntrepreneurPulse.com.au  Fact Sheet for Healthcare Providers:  IncredibleEmployment.be  This test is no t yet approved or cleared by the [place] FDA and  has been authorized for detection and/or diagnosis of SARS-CoV-2 by FDA under an Emergency Use Authorization (EUA). This EUA will remain  in effect (meaning this test can be used) for the duration of the COVID-19 declaration under Section 564(b)(1) of the Act, 21 U.S.C.section 360bbb-3(b)(1), unless the authorization is terminated  or revoked sooner.       Influenza A by PCR NEGATIVE NEGATIVE Final   Influenza B by PCR NEGATIVE NEGATIVE Final    Comment: (NOTE) The Xpert Xpress SARS-CoV-2/FLU/RSV plus assay is intended as an aid in the diagnosis of influenza from Nasopharyngeal swab specimens and should not be used as a sole basis for treatment. Nasal washings and aspirates are unacceptable for Xpert Xpress SARS-CoV-2/FLU/RSV testing.  Fact Sheet for Patients: EntrepreneurPulse.com.au  Fact Sheet for Healthcare Providers: IncredibleEmployment.be  This test is not yet approved or cleared by the [place] FDA and has been authorized for detection and/or diagnosis of SARS-CoV-2 by FDA under an Emergency Use Authorization (EUA). This EUA will remain in effect (meaning this test can be used) for the duration of the COVID-19 declaration under Section 564(b)(1) of the Act, 21 U.S.C. section 360bbb-3(b)(1), unless the authorization is terminated or revoked.  Performed at [facility], [place] [address] [place], [place] [address]   Blood culture (routine x 2)     Status: None (Preliminary result)   Collection Time: 01/26/21  3:45 PM   Specimen: BLOOD  Result  Value Ref Range Status   Specimen Description BLOOD SITE NOT SPECIFIED  Final   Special Requests   Final    BOTTLES DRAWN AEROBIC AND ANAEROBIC Blood Culture adequate volume   Culture   Final    NO GROWTH 2 DAYS Performed at [facility], [address] [place], [place] [address]    Report Status PENDING  Incomplete  Blood culture (routine x 2)     Status: None (Preliminary result)   Collection Time: 01/26/21  3:50 PM   Specimen: BLOOD RIGHT FOREARM  Result Value Ref Range Status   Specimen Description BLOOD RIGHT FOREARM  Final  Special Requests   Final    BOTTLES DRAWN AEROBIC ONLY Blood Culture results may not be optimal due to an inadequate volume of blood received in culture bottles   Culture   Final    NO GROWTH 2 DAYS Performed at [facility], [place] [address] [place], [place] [address]    Report Status PENDING  Incomplete  C Difficile Quick Screen w PCR reflex     Status: None   Collection Time: 01/26/21  4:00 PM   Specimen: STOOL  Result Value Ref Range Status   C Diff antigen NEGATIVE NEGATIVE Final   C Diff toxin NEGATIVE NEGATIVE Final   C Diff interpretation No C. difficile detected.  Final    Comment: Performed at [facility], [place] [address], [place], [place] [address]  Gastrointestinal Panel by PCR , Stool     Status: None   Collection Time: 01/26/21  4:00 PM   Specimen: STOOL  Result Value Ref Range Status   Campylobacter species NOT DETECTED NOT DETECTED Final   Plesimonas shigelloides NOT DETECTED NOT DETECTED Final   Salmonella species NOT DETECTED NOT DETECTED Final   Yersinia enterocolitica NOT DETECTED NOT DETECTED Final   Vibrio species NOT DETECTED NOT DETECTED Final   Vibrio cholerae NOT DETECTED NOT DETECTED Final   Enteroaggregative E coli (EAEC) NOT DETECTED NOT DETECTED Final   Enteropathogenic E coli (EPEC) NOT DETECTED NOT DETECTED Final   Enterotoxigenic E coli (ETEC) NOT DETECTED NOT DETECTED Final   Shiga like toxin  producing E coli (STEC) NOT DETECTED NOT DETECTED Final   Shigella/Enteroinvasive E coli (EIEC) NOT DETECTED NOT DETECTED Final   Cryptosporidium NOT DETECTED NOT DETECTED Final   Cyclospora cayetanensis NOT DETECTED NOT DETECTED Final   Entamoeba histolytica NOT DETECTED NOT DETECTED Final   Giardia lamblia NOT DETECTED NOT DETECTED Final   Adenovirus F40/41 NOT DETECTED NOT DETECTED Final   Astrovirus NOT DETECTED NOT DETECTED Final   Norovirus GI/GII NOT DETECTED NOT DETECTED Final   Rotavirus A NOT DETECTED NOT DETECTED Final   Sapovirus (I, II, IV, and V) NOT DETECTED NOT DETECTED Final    Comment: Performed at [facility], [address] [place], [place] [address]  MRSA Next Gen by PCR, Nasal     Status: None   Collection Time: 01/26/21  6:38 PM   Specimen: Nasal Mucosa; Nasal Swab  Result Value Ref Range Status   MRSA by PCR Next Gen NOT DETECTED NOT DETECTED Final    Comment: (NOTE) The GeneXpert MRSA Assay (FDA approved for NASAL specimens only), is one component of a comprehensive MRSA colonization surveillance program. It is not intended to diagnose MRSA infection nor to guide or monitor treatment for MRSA infections. Test performance is not FDA approved in patients less than 16 years old. Performed at [facility], [place] [address] [place], [place] [address]     RADIOLOGY STUDIES/RESULTS: DG Chest Portable 1 View  Result Date: 01/26/2021 CLINICAL DATA:  Weakness EXAM: PORTABLE CHEST 1 VIEW COMPARISON:  Chest x-ray 09/19/2020 FINDINGS: Cardiomediastinal silhouette is unchanged with tortuous ectatic thoracic aorta calcified plaques. No focal consolidation identified. No pleural effusion or pneumothorax. IMPRESSION: No acute intrathoracic process identified. Electronically Signed   By: Ofilia Neas M.D.   On: 01/26/2021 15:46   DG ESOPHAGUS W SINGLE CM (SOL OR THIN BA)  Result Date: 01/28/2021 CLINICAL DATA:  Dysphagia. Additional history provided:  Rule out stricture. EXAM: ESOPHOGRAM / BARIUM SWALLOW / BARIUM  TABLET STUDY TECHNIQUE: A single contrast examination performed using barium contrast. Additionally, the patient was observed with fluoroscopy swallowing a 13 mm barium sulphate tablet. FLUOROSCOPY TIME:  Fluoroscopy Time:  2 minutes, 42 seconds Radiation Exposure Index (if provided by the fluoroscopic device): 6.3 mGy. COMPARISON:  MR abdomen 09/11/2020.  CT angiogram chest 01/28/2014. FINDINGS: Imaging acquired by Anders Grant, NP. There is a focus of narrowing at the level of the cervical esophagus. A swallowed 13 mm barium tablet did not pass beyond this point, despite a prolonged period of observation. 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 There is an additional site of apparent significant narrowing at the level of the distal esophagus/GE junction, also suspicious for stricture. Elsewhere, the esophagus appears normal in caliber and smooth in contour. No significant esophageal dysmotility was observed. No appreciable hiatal hernia. No gastroesophageal reflux was observed. There was mild laryngeal penetration at the end of the examination. IMPRESSION: Focus of apparent narrowing at the level of the cervical esophagus. 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 Additional site of apparent significant narrowing at the level of the distal esophagus/GE junction, also suspicious for a stricture. Correlation with findings at endoscopy recommended. Mild laryngeal penetration was observed at the end of the examination. A modified barium swallow examination with speech pathology is recommended to further assess for aspiration. Electronically Signed   By: Jackey Loge D.O.   On: 01/28/2021 11:35     LOS: 2 days   Jeoffrey Massed, MD  [facility]    To contact the attending provider between 7A-7P or the covering  provider during after hours 7P-7A, please log into the web site [email or web address] and access using universal [place] password for that web site. If you do not have the password, please call the hospital operator.  01/28/2021, 12:16 PM

## 2021-01-28 NOTE — Progress Notes (Signed)
[facility] Gastroenterology Progress Note  Wanda Valdez 75 y.o. 1945/09/07  CC:  Dysphagia and Diarrhea   Subjective: Patient is still having diarrhea and trouble swallowing. Just returned from barium swallow test, results pending. Denies abdominal pain, nausea, vomiting.  ROS : Review of Systems  Gastrointestinal:  Positive for diarrhea. Negative for abdominal pain, blood in stool, constipation, heartburn, melena, nausea and vomiting.       Dysphagia  Genitourinary:  Negative for dysuria and urgency.     Objective: Vital signs in last 24 hours: Vitals:   01/28/21 0021 01/28/21 0530  BP: 133/80 136/85  Pulse: 68 68  Resp:    Temp: 98.2 F (36.8 C) 97.9 F (36.6 C)  SpO2: (!) 82% (!) 72%    Physical Exam:  General:  Alert, cooperative, no distress, appears stated age  Head:  Normocephalic, without obvious abnormality, atraumatic  Eyes:  Anicteric sclera, EOM's intact  Lungs:   Clear to auscultation bilaterally, respirations unlabored  Heart:  Regular rate and rhythm, S1, S2 normal  Abdomen:   Soft, non-tender, bowel sounds active all four quadrants,  no masses,     Lab Results: Recent Labs    01/27/21 0233 01/28/21 0848  NA 139 134*  K 3.4* 6.1*  CL 113* 117*  CO2 21* 14*  GLUCOSE 145* 101*  BUN 24* 12  CREATININE 1.11* 0.91  CALCIUM 8.1* 7.7*  MG 2.2 1.5*  PHOS 2.7 1.3*   Recent Labs    01/26/21 1537 01/28/21 0848  AST 21 33  ALT 14 27  ALKPHOS 71 98  BILITOT 0.8 0.6  PROT 4.1* 4.7*  ALBUMIN 1.9* 2.0*   Recent Labs    01/26/21 1537 01/26/21 1553 01/27/21 0233 01/28/21 0848  WBC 9.1   < > 7.8 7.5  NEUTROABS 6.5  --   --   --   HGB 9.4*   < > 9.8* 10.6*  HCT 27.9*   < > 28.0* 32.2*  MCV 103.3*   < > 101.4* 104.5*  PLT 162   < > 171 172   < > = values in this interval not displayed.   Recent Labs    01/26/21 1537  LABPROT 13.9  INR 1.1      Assessment Dysphagia - [facility] unable to do endoscopy due to questionable  upper esophageal sphincter stricture vs inability to pass scope. - Barium swallow study pending.  Diarrhea; history of microscopic colitis. - WBC 7.5 - Negative GI pathogen panel and C. Diff  AKI  Plan: Continue prednisone and cholestyramine for diarrhea. Barium swallow study pending, pending results may consider proceeding with EGD with possible dilation. Continue supportive care. [facility] will follow.  Legrand Como PA-C 01/28/2021, 11:29 AM  Contact #  [phone]

## 2021-01-29 ENCOUNTER — Encounter (HOSPITAL_COMMUNITY)
Admission: EM | Disposition: A | Discharge status: Home / Self Care | Payer: Self-pay | Source: Home / Self Care | Attending: Internal Medicine

## 2021-01-29 ENCOUNTER — Inpatient Hospital Stay (HOSPITAL_COMMUNITY): Payer: Medicare Other | Admitting: Anesthesiology

## 2021-01-29 ENCOUNTER — Encounter (HOSPITAL_COMMUNITY): Payer: Self-pay | Admitting: Pulmonary Disease

## 2021-01-29 DIAGNOSIS — R197 Diarrhea, unspecified: Secondary | ICD-10-CM | POA: Diagnosis not present

## 2021-01-29 DIAGNOSIS — R1310 Dysphagia, unspecified: Secondary | ICD-10-CM | POA: Diagnosis not present

## 2021-01-29 DIAGNOSIS — R571 Hypovolemic shock: Secondary | ICD-10-CM | POA: Diagnosis not present

## 2021-01-29 DIAGNOSIS — Z7952 Long term (current) use of systemic steroids: Secondary | ICD-10-CM | POA: Diagnosis not present

## 2021-01-29 HISTORY — PX: SAVORY DILATION: SHX5439

## 2021-01-29 HISTORY — PX: ESOPHAGOGASTRODUODENOSCOPY (EGD) WITH PROPOFOL: SHX5813

## 2021-01-29 HISTORY — PX: BIOPSY: SHX5522

## 2021-01-29 LAB — BASIC METABOLIC PANEL
Anion gap: 7 (ref 5–15)
BUN: 7 mg/dL — ABNORMAL LOW (ref 8–23)
CO2: 21 mmol/L — ABNORMAL LOW (ref 22–32)
Calcium: 7.5 mg/dL — ABNORMAL LOW (ref 8.9–10.3)
Chloride: 108 mmol/L (ref 98–111)
Creatinine, Ser: 0.84 mg/dL (ref 0.44–1.00)
GFR, Estimated: >60 mL/min (ref >60)
Glucose, Bld: 85 mg/dL (ref 70–99)
Potassium: 4.2 mmol/L (ref 3.5–5.1)
Sodium: 136 mmol/L (ref 135–145)

## 2021-01-29 LAB — PHOSPHORUS: Phosphorus: 2.6 mg/dL (ref 2.5–4.6)

## 2021-01-29 LAB — FOLATE: Folate: 30.9 ng/mL (ref >5.9)

## 2021-01-29 LAB — METHYLMALONIC ACID, SERUM: Methylmalonic Acid, Quantitative: 164 nmol/L (ref 0–378)

## 2021-01-29 LAB — CBC
HCT: 29.8 % — ABNORMAL LOW (ref 36.0–46.0)
Hemoglobin: 10.1 g/dL — ABNORMAL LOW (ref 12.0–15.0)
MCH: 34.9 pg — ABNORMAL HIGH (ref 26.0–34.0)
MCHC: 33.9 g/dL (ref 30.0–36.0)
MCV: 103.1 fL — ABNORMAL HIGH (ref 80.0–100.0)
Platelets: 148 10*3/uL — ABNORMAL LOW (ref 150–400)
RBC: 2.89 MIL/uL — ABNORMAL LOW (ref 3.87–5.11)
RDW: 14.4 % (ref 11.5–15.5)
WBC: 6.7 10*3/uL (ref 4.0–10.5)
nRBC: 0 % (ref 0.0–0.2)

## 2021-01-29 LAB — MAGNESIUM: Magnesium: 1.9 mg/dL (ref 1.7–2.4)

## 2021-01-29 SURGERY — ESOPHAGOGASTRODUODENOSCOPY (EGD) WITH PROPOFOL
Anesthesia: Monitor Anesthesia Care

## 2021-01-29 MED ORDER — LIDOCAINE HCL (CARDIAC) PF 100 MG/5ML IV SOSY
PREFILLED_SYRINGE | INTRAVENOUS | Status: DC | PRN
  Administered 2021-01-29: 100 mg via INTRAVENOUS

## 2021-01-29 MED ORDER — LOPERAMIDE HCL 2 MG PO CAPS: 2.0000 mg | ORAL_CAPSULE | ORAL | Status: DC | PRN

## 2021-01-29 MED ORDER — PHENYLEPHRINE 40 MCG/ML (10ML) SYRINGE FOR IV PUSH (FOR BLOOD PRESSURE SUPPORT)
PREFILLED_SYRINGE | INTRAVENOUS | Status: DC | PRN
  Administered 2021-01-29: 160 ug via INTRAVENOUS

## 2021-01-29 MED ORDER — PROPOFOL 500 MG/50ML IV EMUL
INTRAVENOUS | Status: DC | PRN
  Administered 2021-01-29: 100 ug/kg/min via INTRAVENOUS

## 2021-01-29 MED ORDER — LACTATED RINGERS IV SOLN
INTRAVENOUS | Status: AC | PRN
  Administered 2021-01-29: 1000 mL via INTRAVENOUS

## 2021-01-29 MED ORDER — LACTATED RINGERS IV SOLN: INTRAVENOUS | Status: DC

## 2021-01-29 MED ORDER — PROPOFOL 10 MG/ML IV BOLUS
INTRAVENOUS | Status: DC | PRN
  Administered 2021-01-29: 30 mg via INTRAVENOUS
  Administered 2021-01-29 (×2): 20 mg via INTRAVENOUS

## 2021-01-29 SURGICAL SUPPLY — 14 items

## 2021-01-29 NOTE — Op Note (Signed)
[facility] Patient Name: Wanda Valdez Procedure Date : 01/29/2021 MRN: [id number] Attending MD: Vida Rigger , MD Date of Birth: [birth year] CSN: [id number] Age: 75 Admit Type: Inpatient Procedure:                Upper GI endoscopy Indications:              Dysphagia, Abnormal UGI series Providers:                Vida Rigger, MD, Lorenza Evangelist, RN, Priscella Mann,                            Technician Referring MD:              Medicines:                Propofol total dose 100 mg IV, 100 mg IV lidocaine Complications:            No immediate complications. Estimated Blood Loss:     Estimated blood loss was minimal. Procedure:                Pre-Anesthesia Assessment:                           - Prior to the procedure, a History and Physical                            was performed, and patient medications and                            allergies were reviewed. The patient's tolerance of                            previous anesthesia was also reviewed. The risks                            and benefits of the procedure and the sedation                            options and risks were discussed with the patient.                            All questions were answered, and informed consent                            was obtained. Prior Anticoagulants: The patient has                            taken Lovenox (enoxaparin), last dose was day of                            procedure. ASA Grade Assessment: III - A patient                            with severe systemic disease. After reviewing the  risks and benefits, the patient was deemed in                            satisfactory condition to undergo the procedure.                           After obtaining informed consent, the endoscope was                            passed under direct vision. Throughout the                            procedure, the patient's blood pressure, pulse, and                             oxygen saturations were monitored continuously. The                            [id number]-H190 ([id number]) Olympus endoscope was introduced                            through the mouth, and advanced to the third part                            of duodenum. The upper GI endoscopy was                            accomplished without difficulty. The patient                            tolerated the procedure well. Scope In: Scope Out: Findings:      A Tiny hiatal hernia was present. A quick look at the posterior pharynx       was normal      no benign-appearing, intrinsic stenosis was found. The esophagus was       traversed. At the end of the procedure after the esophagus and posterior       pharynx which we evaluated A guidewire was placed and the scope was       withdrawn. Dilation was performed with a Savary dilator with no       resistance at 15 mm. There was no heme on the dilator however when the       dilation site was examined following endoscope reinsertion and it did       showed mild mucosal disruption and mild improvement in luminal narrowing       In the proximal esophagus and estimated blood loss was minimal.      Patchy mild inflammation characterized by congestion (edema) and       granularity was found in the entire examined stomach.      Few non-bleeding superficial duodenal ulcers with no stigmata of       bleeding were found in the duodenal bulb, in the first portion of the       duodenum and in the second portion of the duodenum.      Two small angiodysplastic lesions without bleeding were found in the  second portion of the duodenum and in the third portion of the duodenum.       Biopsies for histology were taken with a cold forceps for evaluation of       celiac disease.      The exam was otherwise without abnormality. Impression:               - Tiny hiatal hernia.                           -No benign-appearing esophageal stenosis. Dilated                             with minimal proximal disruption as above after                            dilation.                           - Chronic gastritis.                           - Non-bleeding duodenal ulcers with no stigmata of                            bleeding.                           - Two non-bleeding angiodysplastic lesions in the                            duodenum. Biopsied.                           - The examination was otherwise normal. Recommendation:           - Clear liquid diet for 4 hours. If doing well may                            slowly advance to full liquids and then soft solids                           - Continue present medications.                           - Await pathology results to rule out celiac.                           - Return to GI clinic PRN. Repeat dilation as                            needed can follow-up either with [place] or with her                            primary gastroenterology group in [facility]                           -  Telephone GI clinic for pathology results in 1                            week.                           - Telephone GI clinic if symptomatic PRN. We will                            also add stool for fecal fat and white blood cells                            to continue diarrhea work-up Procedure Code(s):        --- Professional ---                           [phone], Esophagogastroduodenoscopy, flexible,                            transoral; with insertion of guide wire followed by                            passage of dilator(s) through esophagus over guide                            wire                           [id number], 59, Esophagogastroduodenoscopy, flexible,                            transoral; with biopsy, single or multiple Diagnosis Code(s):        --- Professional ---                           K44.9, Diaphragmatic hernia without obstruction or                            gangrene                           K22.2,  Esophageal obstruction                           K29.50, Unspecified chronic gastritis without                            bleeding                           K26.9, Duodenal ulcer, unspecified as acute or                            chronic, without hemorrhage or perforation                           K31.819, Angiodysplasia of stomach and duodenum  without bleeding                           R13.10, Dysphagia, unspecified                           R93.3, Abnormal findings on diagnostic imaging of                            other parts of digestive tract CPT copyright 2019 American Medical Association. All rights reserved. The codes documented in this report are preliminary and upon coder review may  be revised to meet current compliance requirements. Vida Rigger, MD 01/29/2021 9:59:48 AM This report has been signed electronically. Number of Addenda: 0

## 2021-01-29 NOTE — Anesthesia Procedure Notes (Signed)
Procedure Name: MAC Date/Time: 01/29/2021 9:21 AM Performed by: Erick Colace, CRNA Pre-anesthesia Checklist: Patient identified, Emergency Drugs available, Suction available and Patient being monitored Patient Re-evaluated:Patient Re-evaluated prior to induction Oxygen Delivery Method: Simple face mask Preoxygenation: Pre-oxygenation with 100% oxygen Induction Type: IV induction

## 2021-01-29 NOTE — Transfer of Care (Signed)
Immediate Anesthesia Transfer of Care Note  Patient: Wanda Valdez  Procedure(s) Performed: ESOPHAGOGASTRODUODENOSCOPY (EGD) WITH PROPOFOL BIOPSY SAVORY DILATION  Patient Location: PACU  Anesthesia Type:MAC  Level of Consciousness: awake and alert   Airway & Oxygen Therapy: Patient Spontanous Breathing  Post-op Assessment: Report given to RN and Post -op Vital signs reviewed and stable  Post vital signs: Reviewed and stable  Last Vitals:  Vitals Value Taken Time  BP 93/53 01/29/21 0945  Temp 36.6 C 01/29/21 0945  Pulse    Resp 16 01/29/21 0945  SpO2      Last Pain:  Vitals:   01/29/21 0817  TempSrc: Other (Comment)  PainSc: 7          Complications: No notable events documented.

## 2021-01-29 NOTE — Progress Notes (Signed)
PROGRESS NOTE        PATIENT DETAILS Name: Wanda Valdez Age: 75 y.o. Sex: female Date of Birth: 07/09/1945 Admit Date: 01/26/2021 Admitting Physician Juanito Doom, MD [id number], Elenore Rota, PA-C  Brief Narrative: Patient is a 75 y.o. 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  Upon stability-she was [facility] to [facility] on 11/29.  Subjective: Diarrhea much better-tolerating liquids well so far. Underwent EGD earlier this am  Objective: Vitals: Blood pressure 123/65, pulse 60, temperature (!) 97.5 F (36.4 C), temperature source Oral, resp. rate 18, height 4\' 10"  (1.473 m), weight 39.2 kg, SpO2 96 %.   Exam: Gen Exam:Alert awake-not in any distress HEENT:atraumatic, normocephalic Chest: B/L clear to auscultation anteriorly CVS:S1S2 regular Abdomen:soft non tender, non distended Extremities:no edema Neurology: Non focal Skin: no rash   Pertinent Labs/Radiology: Recent Labs  Lab 01/26/21 1537 01/26/21 1553 01/28/21 0848 01/28/21 1549 01/29/21 0550  WBC 9.1   < > 7.5  --  6.7  HGB 9.4*   < > 10.6*  --  10.1*  PLT 162   < > 172  --  148*  NA 139   < > 134*   < > 136  K 2.3*   < > 6.1*   < > 4.2  CREATININE 1.50*   < > 0.91   < > 0.84  AST 21  --  33  --   --   ALT 14  --  27  --   --   ALKPHOS 71  --  98  --   --   BILITOT 0.8  --  0.6  --   --    < > = values in this interval not displayed.      Assessment/Plan: Diarrhea: Likely due to flare of her lymphocytic-stool studies for C. difficile/GI pathogen panel negative-diarrhea slowing down-GI following-on regular dosing of steroids.  On as needed Imodium.  Hypovolemic shock: Resolved-this was due to diarrhea and oral intake.  Treated with IV fluids-temporarily required pressors in the  ICU.  Dysphagia: Barium esophagogram showed a possible stricture-s/p EGD on 11/30 which showed a benign-appearing stricture-underwent dilatation-starting clear liquids-we will advance slowly.  Appreciate GI input   AKI: Hemodynamically mediated-in the setting of volume loss from diarrhea and poor oral intake-resolved-creatinine back to baseline.  Hyperkalemia: Resolved.  Non anion gap metabolic acidosis: Likely due to diarrhea-better with bicarb supplementation with IVF.  Macrocytic anemia: Vitamin B12 and folate levels within normal limits.  Supportive care for now.  Hypophosphatemia: Repleted.  Hypomagnesemia: Repleted.  Debility/deconditioning: Due to acute illness-obtaining PT/OT  Nutrition Status: Nutrition Problem: Severe Malnutrition Etiology: chronic illness (lymphocytic colitis, dysphagia) Signs/Symptoms: severe muscle depletion, severe fat depletion Interventions: Boost Breeze, MVI, Hormel Shake  Underweight: Estimated body mass index is 18.06 kg/m as calculated from the following:   Height as of this encounter: 4\' 10"  (1.473 m).   Weight as of this encounter: 39.2 kg.   Procedures: None Consults: GI,PCCM DVT Prophylaxis: SQ heparin Code Status:Full code  Family Communication: None at bedside  Time spent: 35 minutes-Greater than 50% of this time was spent in counseling, explanation of diagnosis, planning of further management, and coordination of care.   Disposition Plan: Status is: Inpatient  Remains inpatient appropriate because: Resolving hypovolemic shock-dysphagia work-up in progress-severely hypokalemic today.    Diet: Diet Order             Diet clear liquid Room service appropriate? Yes; Fluid consistency: Thin  Diet effective now                     Antimicrobial agents: Anti-infectives (From admission, onward)    None        MEDICATIONS: Scheduled Meds:  Chlorhexidine Gluconate Cloth  6 each Topical Daily   cholestyramine  1  packet Oral BID   feeding supplement  1 Container Oral TID BM   heparin  5,000 Units Subcutaneous Q8H   multivitamin with minerals  1 tablet Oral Daily   pantoprazole (PROTONIX) IV  40 mg Intravenous Q24H   predniSONE  10 mg Oral Q breakfast   Continuous Infusions:  sodium chloride 10 mL/hr at 01/29/21 0906   sodium bicarbonate 150 mEq in D5W infusion 75 mL/hr at 01/29/21 0710   PRN Meds:.docusate sodium, guaiFENesin, loperamide, ondansetron (ZOFRAN) IV   I have personally reviewed following labs and imaging studies  LABORATORY DATA: CBC: Recent Labs  Lab 01/26/21 1537 01/26/21 1553 01/26/21 2056 01/27/21 0233 01/28/21 0848 01/29/21 0550  WBC 9.1  --  9.3 7.8 7.5 6.7  NEUTROABS 6.5  --   --   --   --   --   HGB 9.4* 9.5* 10.0* 9.8* 10.6* 10.1*  HCT 27.9* 28.0* 29.7* 28.0* 32.2* 29.8*  MCV 103.3*  --  102.1* 101.4* 104.5* 103.1*  PLT 162  --  160 171 172 148*     Basic Metabolic Panel: Recent Labs  Lab 01/26/21 1537 01/26/21 1553 01/26/21 2056 01/27/21 0233 01/28/21 0848 01/28/21 1549 01/29/21 0550  NA 139   < > 136 139 134* 135 136  K 2.3*   < > 3.2* 3.4* 6.1* 5.0 4.2  CL 108   < > 110 113* 117* 114* 108  CO2 21*  --  17* 21* 14* 14* 21*  GLUCOSE 136*   < > 129* 145* 101* 82 85  BUN 31*   < > 27* 24* 12 10 7*  CREATININE 1.50*   < > 1.23* 1.11* 0.91 0.92 0.84  CALCIUM 8.1*  --  8.3* 8.1* 7.7* 7.9* 7.5*  MG 1.5*  --  QUANTITY NOT SUFFICIENT, UNABLE TO PERFORM TEST 2.2 1.5*  --  1.9  PHOS  --   --  2.3* 2.7 1.3*  --  2.6   < > = values in this interval not displayed.     GFR: Estimated Creatinine Clearance: 34.7 mL/min (by C-G formula based on SCr of 0.84 mg/dL).  Liver Function Tests: Recent Labs  Lab 01/26/21 1537 01/28/21 0848  AST 21 33  ALT 14 27  ALKPHOS 71 98  BILITOT 0.8 0.6  PROT 4.1* 4.7*  ALBUMIN 1.9* 2.0*    No results for input(s): LIPASE, AMYLASE in the last 168 hours. No results for input(s): AMMONIA in the last 168  hours.  Coagulation Profile: Recent Labs  Lab 01/26/21 1537  INR 1.1     Cardiac Enzymes: No results for input(s): CKTOTAL, CKMB, CKMBINDEX, TROPONINI in the last 168 hours.  BNP (last 3 results) No results for input(s): PROBNP in the last 8760 hours.  Lipid Profile: No results for input(s): CHOL, HDL, LDLCALC, TRIG, CHOLHDL, LDLDIRECT in the last 72 hours.  Thyroid Function Tests: Recent Labs    01/27/21 1712  TSH 0.559     Anemia Panel: Recent Labs    01/29/21 0550  FOLATE 30.9    Urine analysis:    Component Value Date/Time   COLORURINE YELLOW 01/26/2021 [place] 01/26/2021 1512   LABSPEC 1.029 01/26/2021 1512   PHURINE 7.0 01/26/2021 1512   GLUCOSEU NEGATIVE 01/26/2021 1512   HGBUR NEGATIVE 01/26/2021 1512   BILIRUBINUR NEGATIVE 01/26/2021 1512   KETONESUR NEGATIVE 01/26/2021 1512   PROTEINUR NEGATIVE 01/26/2021 1512   NITRITE NEGATIVE 01/26/2021 1512   LEUKOCYTESUR NEGATIVE 01/26/2021 1512    Sepsis Labs: Lactic Acid, Venous    Component Value Date/Time   LATICACIDVEN 1.5 01/26/2021 1712    MICROBIOLOGY: Recent Results (from the past 240 hour(s))  Resp Panel by RT-PCR (Flu A&B, Covid) Nasopharyngeal Swab     Status: None   Collection Time: 01/26/21  3:12 PM   Specimen: Nasopharyngeal Swab; Nasopharyngeal(NP) swabs in vial transport medium  Result Value Ref Range Status   SARS Coronavirus 2 by RT PCR NEGATIVE NEGATIVE Final    Comment: (NOTE) SARS-CoV-2 target nucleic acids are NOT DETECTED.  The SARS-CoV-2 RNA is generally detectable in upper respiratory specimens during the acute phase of infection. The lowest concentration of SARS-CoV-2 viral copies this assay can detect is 138 copies/mL. A negative result does not preclude SARS-Cov-2 infection and should not be used as the sole basis for treatment or other patient management decisions. A negative result may occur with  improper specimen collection/handling, submission  of specimen other than nasopharyngeal swab, presence of viral mutation(s) within the areas targeted by this assay, and inadequate number of viral copies(<138 copies/mL). A negative result must be combined with clinical observations, patient history, and epidemiological information. The expected result is Negative.  Fact Sheet for Patients:  EntrepreneurPulse.com.au  Fact Sheet for Healthcare Providers:  IncredibleEmployment.be  This test is no t yet approved or cleared by the [place] FDA and  has been authorized for detection and/or diagnosis of SARS-CoV-2 by FDA under an Emergency Use Authorization (EUA). This EUA will remain  in effect (meaning this test can be used) for the duration of the COVID-19 declaration under Section 564(b)(1) of the Act, 21 U.S.C.section 360bbb-3(b)(1), unless the authorization is terminated  or revoked sooner.       Influenza A by PCR NEGATIVE NEGATIVE Final   Influenza B by PCR NEGATIVE NEGATIVE Final    Comment: (NOTE) The Xpert Xpress SARS-CoV-2/FLU/RSV plus assay is intended as an aid in the diagnosis of influenza from Nasopharyngeal swab specimens and should not be used as a sole basis for treatment. Nasal washings and aspirates are unacceptable for Xpert Xpress SARS-CoV-2/FLU/RSV testing.  Fact Sheet for Patients: EntrepreneurPulse.com.au  Fact Sheet for Healthcare Providers: IncredibleEmployment.be  This test is not yet approved or cleared by the [place] FDA and has been authorized for detection and/or diagnosis of SARS-CoV-2 by FDA under an Emergency Use Authorization (EUA). This EUA will remain in effect (meaning this test can be used) for the duration of the COVID-19 declaration under Section 564(b)(1) of the Act, 21 U.S.C. section 360bbb-3(b)(1), unless the authorization is terminated or revoked.  Performed at [facility], [place] [address],  [place], [place] [address]   Blood culture (routine x 2)     Status: None (Preliminary result)   Collection Time: 01/26/21  3:45 PM   Specimen: BLOOD  Result Value Ref Range Status   Specimen Description BLOOD SITE NOT SPECIFIED  Final   Special Requests  Final    BOTTLES DRAWN AEROBIC AND ANAEROBIC Blood Culture adequate volume   Culture   Final    NO GROWTH 3 DAYS Performed at [facility], [place] [address] [place], [place] [address]    Report Status PENDING  Incomplete  Blood culture (routine x 2)     Status: None (Preliminary result)   Collection Time: 01/26/21  3:50 PM   Specimen: BLOOD RIGHT FOREARM  Result Value Ref Range Status   Specimen Description BLOOD RIGHT FOREARM  Final   Special Requests   Final    BOTTLES DRAWN AEROBIC ONLY Blood Culture results may not be optimal due to an inadequate volume of blood received in culture bottles   Culture   Final    NO GROWTH 3 DAYS Performed at [facility], [place] [address], [place], [place] [address]    Report Status PENDING  Incomplete  C Difficile Quick Screen w PCR reflex     Status: None   Collection Time: 01/26/21  4:00 PM   Specimen: STOOL  Result Value Ref Range Status   C Diff antigen NEGATIVE NEGATIVE Final   C Diff toxin NEGATIVE NEGATIVE Final   C Diff interpretation No C. difficile detected.  Final    Comment: Performed at [facility], [place] [address] [place], [place] [address]  Gastrointestinal Panel by PCR , Stool     Status: None   Collection Time: 01/26/21  4:00 PM   Specimen: STOOL  Result Value Ref Range Status   Campylobacter species NOT DETECTED NOT DETECTED Final   Plesimonas shigelloides NOT DETECTED NOT DETECTED Final   Salmonella species NOT DETECTED NOT DETECTED Final   Yersinia enterocolitica NOT DETECTED NOT DETECTED Final   Vibrio species NOT DETECTED NOT DETECTED Final   Vibrio cholerae NOT DETECTED NOT DETECTED Final   Enteroaggregative E coli (EAEC) NOT DETECTED NOT DETECTED  Final   Enteropathogenic E coli (EPEC) NOT DETECTED NOT DETECTED Final   Enterotoxigenic E coli (ETEC) NOT DETECTED NOT DETECTED Final   Shiga like toxin producing E coli (STEC) NOT DETECTED NOT DETECTED Final   Shigella/Enteroinvasive E coli (EIEC) NOT DETECTED NOT DETECTED Final   Cryptosporidium NOT DETECTED NOT DETECTED Final   Cyclospora cayetanensis NOT DETECTED NOT DETECTED Final   Entamoeba histolytica NOT DETECTED NOT DETECTED Final   Giardia lamblia NOT DETECTED NOT DETECTED Final   Adenovirus F40/41 NOT DETECTED NOT DETECTED Final   Astrovirus NOT DETECTED NOT DETECTED Final   Norovirus GI/GII NOT DETECTED NOT DETECTED Final   Rotavirus A NOT DETECTED NOT DETECTED Final   Sapovirus (I, II, IV, and V) NOT DETECTED NOT DETECTED Final    Comment: Performed at [facility], [place] [place], [place] [address]  MRSA Next Gen by PCR, Nasal     Status: None   Collection Time: 01/26/21  6:38 PM   Specimen: Nasal Mucosa; Nasal Swab  Result Value Ref Range Status   MRSA by PCR Next Gen NOT DETECTED NOT DETECTED Final    Comment: (NOTE) The GeneXpert MRSA Assay (FDA approved for NASAL specimens only), is one component of a comprehensive MRSA colonization surveillance program. It is not intended to diagnose MRSA infection nor to guide or monitor treatment for MRSA infections. Test performance is not FDA approved in patients less than 38 years old. Performed at [facility], [place] [address], [place], [place] [address]     RADIOLOGY STUDIES/RESULTS: DG ESOPHAGUS W SINGLE CM (  SOL OR THIN BA)  Result Date: 01/28/2021 CLINICAL DATA:  Dysphagia. Additional history provided: Rule out stricture. EXAM: ESOPHOGRAM / BARIUM SWALLOW / BARIUM TABLET STUDY TECHNIQUE: A single contrast examination performed using barium contrast. Additionally, the patient was observed with fluoroscopy swallowing a 13 mm barium sulphate tablet. FLUOROSCOPY TIME:  Fluoroscopy Time:  2 minutes,  42 seconds Radiation Exposure Index (if provided by the fluoroscopic device): 6.3 mGy. COMPARISON:  MR abdomen 09/11/2020.  CT angiogram chest 01/28/2014. FINDINGS: Imaging acquired by Rushie Nyhan, NP. There is a focus of narrowing at the level of the cervical esophagus. A swallowed 13 mm barium tablet did not pass beyond this point, despite a prolonged period of observation. 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 There is an additional site of apparent significant narrowing at the level of the distal esophagus/GE junction, also suspicious for stricture. Elsewhere, the esophagus appears normal in caliber and smooth in contour. No significant esophageal dysmotility was observed. No appreciable hiatal hernia. No gastroesophageal reflux was observed. There was mild laryngeal penetration at the end of the examination. IMPRESSION: Focus of apparent narrowing at the level of the cervical esophagus. 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 Additional site of apparent significant narrowing at the level of the distal esophagus/GE junction, also suspicious for a stricture. Correlation with findings at endoscopy recommended. Mild laryngeal penetration was observed at the end of the examination. A modified barium swallow examination with speech pathology is recommended to further assess for aspiration. Electronically Signed   By: Kellie Simmering D.O.   On: 01/28/2021 11:35     LOS: 3 days   Oren Binet, MD  [facility]    To contact the attending provider between 7A-7P or the covering provider during after hours 7P-7A, please log into the web site [email or web address] and access using universal [place] password for that web site. If you do not have the password, please call the hospital operator.  01/29/2021, 11:36 AM

## 2021-01-29 NOTE — Anesthesia Postprocedure Evaluation (Signed)
Anesthesia Post Note  Patient: Wanda Valdez  Procedure(s) Performed: ESOPHAGOGASTRODUODENOSCOPY (EGD) WITH PROPOFOL BIOPSY SAVORY DILATION     Patient location during evaluation: PACU Anesthesia Type: MAC Level of consciousness: awake Pain management: pain level controlled Vital Signs Assessment: post-procedure vital signs reviewed and stable Respiratory status: spontaneous breathing Cardiovascular status: stable Postop Assessment: no apparent nausea or vomiting Anesthetic complications: no   No notable events documented.  Last Vitals:  Vitals:   01/29/21 0945 01/29/21 1000  BP: (!) 93/53 (!) 108/56  Pulse: 72 72  Resp: 16 20  Temp: 36.6 C 36.6 C  SpO2: 95% 98%    Last Pain:  Vitals:   01/29/21 1000  TempSrc:   PainSc: 0-No pain                 John F Salome Arnt

## 2021-01-29 NOTE — Progress Notes (Signed)
Wanda Valdez 8:54 AM  Subjective: Patient seen and examined and does not have any new complaints and see yesterday note for recommendations about her diarrhea  Objective: Vital signs stable afebrile no acute distress exam please see preassessment evaluation labs stable  Assessment: Multiple medical problems including dysphagia and abnormal barium swallow  Plan: Okay to proceed with endoscopy and probable dilation and the risks were discussed again with the patient  [facility]  office [phone] After 5PM or if no answer call [phone]

## 2021-01-29 NOTE — Anesthesia Preprocedure Evaluation (Addendum)
Anesthesia Evaluation  Patient identified by MRN, date of birth, ID band Patient awake    Reviewed: Allergy & Precautions, NPO status , Patient's Chart, lab work & pertinent test results, reviewed documented beta blocker date and time   Airway Mallampati: II  TM Distance: >3 FB Neck ROM: Full    Dental no notable dental hx. (+) Edentulous Upper, Poor Dentition,    Pulmonary Current Smoker and Patient abstained from smoking.,    Pulmonary exam normal breath sounds clear to auscultation       Cardiovascular Exercise Tolerance: Good hypertension, Pt. on medications and Pt. on home beta blockers (-) CHF Normal cardiovascular exam Rhythm:Regular Rate:Normal     Neuro/Psych negative neurological ROS  negative psych ROS   GI/Hepatic Neg liver ROS, GERD  Medicated and Controlled,  Endo/Other  negative endocrine ROS  Renal/GU Renal diseaseLab Results      Component                Value               Date                      CREATININE               1.03 (H)            09/11/2020                BUN                      19                  09/11/2020                NA                       137                 09/11/2020                K                        3.5                 09/11/2020                CL                       116 (H)             09/11/2020                CO2                      16 (L)              09/11/2020             negative genitourinary   Musculoskeletal negative musculoskeletal ROS (+)   Abdominal Normal abdominal exam  (+)   Peds  Hematology  (+) anemia , Lab Results      Component                Value               Date  WBC                      3.8 (L)             09/11/2020                HGB                      9.6 (L)             09/11/2020                HCT                      29.8 (L)            09/11/2020                MCV                      104.2 (H)            09/11/2020                PLT                      151                 09/11/2020              Anesthesia Other Findings BMI 18  Reproductive/Obstetrics                             Anesthesia Physical  Anesthesia Plan  ASA: 3  Anesthesia Plan: MAC   Post-op Pain Management:    Induction: Intravenous  PONV Risk Score and Plan: Treatment may vary due to age or medical condition and Propofol infusion  Airway Management Planned: Natural Airway and Mask  Additional Equipment: None  Intra-op Plan:   Post-operative Plan:   Informed Consent: I have reviewed the patients History and Physical, chart, labs and discussed the procedure including the risks, benefits and alternatives for the proposed anesthesia with the patient or authorized representative who has indicated his/her understanding and acceptance.     Dental advisory given  Plan Discussed with: CRNA  Anesthesia Plan Comments:         Anesthesia Quick Evaluation

## 2021-01-29 NOTE — Evaluation (Signed)
Occupational Therapy Evaluation Patient Details Name: Wanda Valdez MRN: [id number] DOB: 04/07/1945 Today's Date: 01/29/2021   History of Present Illness 75 y.o. female presenting to ER on 11/27 for poor PO intake and profound diarrhea. PMH includes lymphocytic diarrhea, esophageal stricture and HTN   Clinical Impression   Pt independent with ADLs at baseline, reports using SPC intermittently when in community for mobility. Daughter lives with her and assists with IADLs including cooking and household chores. Pt set up - min A for ADLs, Mod I for bed mobility, min guard for transfers. RW used during transfers, pt feels more stable. Pt presenting with decreased balance, activity tolerance, strength, and ROM at this time. Will continue to follow acutely, recommend d/c home with assistance.     Recommendations for follow up therapy are one component of a multi-disciplinary discharge planning process, led by the attending physician.  Recommendations may be updated based on patient status, additional functional criteria and insurance authorization.   Follow Up Recommendations  No OT follow up    Assistance Recommended at Discharge Set up Supervision/Assistance  Functional Status Assessment  Patient has had a recent decline in their functional status and demonstrates the ability to make significant improvements in function in a reasonable and predictable amount of time.  Equipment Recommendations  BSC/3in1;Other (comment) (as shower seat)    Recommendations for Other Services PT consult     Precautions / Restrictions Restrictions Weight Bearing Restrictions: No      Mobility Bed Mobility Overal bed mobility: Modified Independent                  Transfers Overall transfer level: Needs assistance Equipment used: Rolling walker (2 wheels) Transfers: Sit to/from Stand Sit to Stand: Min guard           General transfer comment: requires increased time, slow walking  speed with cues to keep RW close to body      Balance Overall balance assessment: No apparent balance deficits (not formally assessed)                                         ADL either performed or assessed with clinical judgement   ADL Overall ADL's : Needs assistance/impaired Eating/Feeding: Set up;Sitting   Grooming: Set up;Standing;Sitting   Upper Body Bathing: Supervision/ safety;Sitting   Lower Body Bathing: Minimal assistance;Sitting/lateral leans   Upper Body Dressing : Set up;Sitting   Lower Body Dressing: Supervision/safety;Sitting/lateral leans Lower Body Dressing Details (indicate cue type and reason): donned socks using figure 4 technique sitting EOB Toilet Transfer: Min guard;Rolling walker (2 wheels);Ambulation;Regular Social worker and Hygiene: Supervision/safety;Sitting/lateral lean Toileting - Clothing Manipulation Details (indicate cue type and reason): able to complete pericare sitting on commode     Functional mobility during ADLs: Min guard General ADL Comments: RN gave meds at beginning of session, pt clearing throat throughout session despite being given multiple sips of water, coughing up mucus. RN notified     Vision Baseline Vision/History: 1 Wears glasses (for reading only) Vision Assessment?: No apparent visual deficits     Perception     Praxis      Pertinent Vitals/Pain Pain Assessment: No/denies pain     Hand Dominance     Extremity/Trunk Assessment Upper Extremity Assessment Upper Extremity Assessment: Overall WFL for tasks assessed   Lower Extremity Assessment Lower Extremity Assessment: Defer to PT evaluation  Communication Communication Communication: No difficulties   Cognition Arousal/Alertness: Awake/alert Behavior During Therapy: WFL for tasks assessed/performed Overall Cognitive Status: Within Functional Limits for tasks assessed                                        General Comments       Exercises     Shoulder Instructions      Home Living Family/patient expects to be discharged to:: Private residence Living Arrangements: Children Available Help at Discharge: Family Type of Home: House       Home Layout: Laundry or work area in basement;Able to live on main level with bedroom/bathroom     Bathroom Shower/Tub: Chief Strategy Officer: Standard     Home Equipment: Medical laboratory scientific officer - single point   Additional Comments: uses cane within community, not used for household distances      Prior Functioning/Environment Prior Level of Function : Independent/Modified Independent             Mobility Comments: SPC intermittently          OT Problem List: Decreased strength;Decreased range of motion;Decreased activity tolerance;Impaired balance (sitting and/or standing);Pain      OT Treatment/Interventions: Self-care/ADL training;Therapeutic exercise;Patient/family education;Balance training;Therapeutic activities;Energy conservation;DME and/or AE instruction    OT Goals(Current goals can be found in the care plan section) Acute Rehab OT Goals Patient Stated Goal: return home OT Goal Formulation: With patient Time For Goal Achievement: 02/12/21 Potential to Achieve Goals: Good ADL Goals Pt Will Perform Upper Body Dressing: with modified independence;sitting Pt Will Perform Lower Body Dressing: with modified independence;sit to/from stand Pt Will Transfer to Toilet: with modified independence;ambulating;regular height toilet Pt Will Perform Tub/Shower Transfer: with modified independence;ambulating;Tub transfer  OT Frequency: Min 2X/week   Barriers to D/C:            Co-evaluation              AM-PAC OT "6 Clicks" Daily Activity     Outcome Measure Help from another person eating meals?: None Help from another person taking care of personal grooming?: None Help from another person toileting,  which includes using toliet, bedpan, or urinal?: A Little Help from another person bathing (including washing, rinsing, drying)?: A Little Help from another person to put on and taking off regular upper body clothing?: None Help from another person to put on and taking off regular lower body clothing?: None 6 Click Score: 22   End of Session Equipment Utilized During Treatment: Gait belt;Rolling walker (2 wheels) Nurse Communication: Mobility status;Other (comment) (RN notified of pt's continued coughing and use of RW to transfer)  Activity Tolerance: Patient tolerated treatment well Patient left: in chair;with chair alarm set;with call bell/phone within reach  OT Visit Diagnosis: Unsteadiness on feet (R26.81);Other abnormalities of gait and mobility (R26.89);Muscle weakness (generalized) (M62.81);Pain                Time: 2951-8841 OT Time Calculation (min): 30 min Charges:  OT General Charges $OT Visit: 1 Visit OT Evaluation $OT Eval Low Complexity: 1 Low OT Treatments $Self Care/Home Management : 8-22 mins  Alfonzo Beers, OTD, OTR/L Acute Rehab [phone]) [phone]   Mayer Masker 01/29/2021, 11:40 AM

## 2021-01-30 ENCOUNTER — Encounter (HOSPITAL_COMMUNITY): Payer: Self-pay | Admitting: Gastroenterology

## 2021-01-30 LAB — SURGICAL PATHOLOGY

## 2021-01-30 MED ORDER — PREDNISONE 10 MG PO TABS: 10.0000 mg | ORAL_TABLET | Freq: Every day | ORAL | 1 refills | Status: DC

## 2021-01-30 NOTE — Care Management Important Message (Signed)
Important Message  Patient Details  Name: Wanda Valdez MRN: [id number] Date of Birth: August 16, 1945   Medicare Important Message Given:  Yes     Sherilyn Banker 01/30/2021, 2:39 PM

## 2021-01-30 NOTE — Progress Notes (Signed)
Mobility Specialist Progress Note:   01/30/21 1200  Mobility  Activity Ambulated in hall  Level of Assistance Modified independent, requires aide device or extra time  Assistive Device Front wheel walker  Distance Ambulated (ft) 200 ft  Mobility Ambulated independently in hallway  Mobility Response Tolerated well  Mobility performed by Mobility specialist  Bed Position Chair  $Mobility charge 1 Mobility   Pt asx during ambulation. States she feels confident ambulating with RW. Pt in chair with call bell in reach.   Addison Lank Mobility Specialist  Phone [phone]

## 2021-01-30 NOTE — Progress Notes (Signed)
PT Cancellation Note  Patient Details Name: Wanda Valdez MRN: [id number] DOB: [birth year]   Cancelled Treatment:    Reason Eval/Treat Not Completed: Patient declined, no reason specified (pt walked with mobility specialist earlier and reports moving at her baseline. pt declined therapy eval prior to D/C stating no needs. will sign off)   Usha Slager B Ruthie Berch 01/30/2021, 1:34 PM Merryl Hacker, PT Acute Rehabilitation Services Pager: [phone] Office: [phone]

## 2021-01-30 NOTE — Discharge Summary (Signed)
PATIENT DETAILS Name: Wanda Valdez Age: 75 y.o. Sex: female Date of Birth: 04/06/1945 MRN: [id number]. Admitting Physician: Juanito Doom, MD [id number], Eather Colas  Admit Date: 01/26/2021 Discharge date: 01/30/2021  Recommendations for Outpatient Follow-up:  Follow up with PCP in 1-2 weeks Please obtain CMP/CBC in one week Please follow up with gastroenterology.  Admitted From:  Home  Disposition: [place]: No  Equipment/Devices: None  Discharge Condition: Stable  CODE STATUS: FULL CODE  Diet recommendation:  Diet Order             Diet - low sodium heart healthy           DIET DYS 3 Room service appropriate? Yes; Fluid consistency: Thin  Diet effective now                    Brief Summary: Patient is a 76 y.o. 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  Upon stability-she was transferred to the [facility] hospitalist service on 11/29.  Brief Hospital Course: Diarrhea: Likely due to flare of her lymphocytic-stool studies for C. difficile/GI pathogen panel negative-diarrhea has significantly improved and back to patient's past baseline of around 2-3 BMs daily.  Resumed on regular dosing of steroids-continue as needed Lomotil/Imodium.   Hypovolemic shock: Resolved-this was due to diarrhea and oral intake.  Treated with IV fluids-temporarily required pressors in the ICU.   Dysphagia: Barium esophagogram showed a possible stricture-s/p EGD on 11/30 which showed a benign-appearing stricture-underwent dilatation-slowly started on clear liquids and has been advanced to a soft diet which she has tolerated.  Please ensure patient follows with outpatient gastroenterology.    AKI: Hemodynamically mediated-in the setting of volume loss from diarrhea and poor oral  intake-resolved-creatinine back to baseline.   Hyperkalemia: Resolved.   Non anion gap metabolic acidosis: Likely due to diarrhea-better with control of diarrhea and bicarbonate supplementation.   Macrocytic anemia: Vitamin B12 and folate levels within normal limits.  Supportive care for now.   Hypophosphatemia: Repleted.   Hypomagnesemia: Repleted.  HTN: BP controlled without the use of any antihypertensive medications-she appears to be on HCTZ/Lasix/lisinopril and metoprolol.  Will restart metoprolol and have her follow-up with her primary care practitioner and reassess whether she needs to be started on her other antihypertensives   Debility/deconditioning: Due to acute illness-evaluated by rehab services-no recommendations.   Nutrition Status: Nutrition Problem: Severe Malnutrition Etiology: chronic illness (lymphocytic colitis, dysphagia) Signs/Symptoms: severe muscle depletion, severe fat depletion Interventions: Boost Breeze, MVI, Hormel Shake   Underweight: Estimated body mass index is 18.06 kg/m as calculated from the following:   Height as of this encounter: 4\' 10"  (1.473 m).   Weight as of this encounter: 39.2 kg.    Procedures 11/30>>EGD   Discharge Diagnoses:  Principal Problem:   Hypovolemic shock ([place]) Active Problems:   Hypokalemia   Dysphagia   Diarrhea   Lymphocytic colitis   Protein-calorie malnutrition, severe   Current chronic use of systemic steroids   Discharge Instructions:  Activity:  As tolerated with Full fall precautions use walker/cane & assistance as needed   Discharge Instructions     Call MD for:  persistant nausea and vomiting   Complete by: As directed    Diet - low sodium heart healthy   Complete by: As directed    Discharge instructions   Complete by:  As directed    Follow with Primary MD  Bulla, Elenore Rota, PA-C in 1-2 weeks  Please get a complete blood count and chemistry panel checked by your Primary MD at your next visit,  and again as instructed by your Primary MD.  Get Medicines reviewed and adjusted: Please take all your medications with you for your next visit with your Primary MD  Laboratory/radiological data: Please request your Primary MD to go over all hospital tests and procedure/radiological results at the follow up, please ask your Primary MD to get all Hospital records sent to his/her office.  In some cases, they will be blood work, cultures and biopsy results pending at the time of your discharge. Please request that your primary care M.D. follows up on these results.  Also Note the following: If you experience worsening of your admission symptoms, develop shortness of breath, life threatening emergency, suicidal or homicidal thoughts you must seek medical attention immediately by calling 911 or calling your MD immediately  if symptoms less severe.  You must read complete instructions/literature along with all the possible adverse reactions/side effects for all the Medicines you take and that have been prescribed to you. Take any new Medicines after you have completely understood and accpet all the possible adverse reactions/side effects.   Do not drive when taking Pain medications or sleeping medications (Benzodaizepines)  Do not take more than prescribed Pain, Sleep and Anxiety Medications. It is not advisable to combine anxiety,sleep and pain medications without talking with your primary care practitioner  Special Instructions: If you have smoked or chewed Tobacco  in the last 2 yrs please stop smoking, stop any regular Alcohol  and or any Recreational drug use.  Wear Seat belts while driving.  Please note: You were cared for by a hospitalist during your hospital stay. Once you are discharged, your primary care physician will handle any further medical issues. Please note that NO REFILLS for any discharge medications will be authorized once you are discharged, as it is imperative that you return  to your primary care physician (or establish a relationship with a primary care physician if you do not have one) for your post hospital discharge needs so that they can reassess your need for medications and monitor your lab values.   1.  Please ensure that you follow-up with the gastroenterologist.  2.  Your blood pressure has been stable-hence we have only restarted you on metoprolol-your other blood pressure medications have been placed on hold until you follow-up with your primary care doctor and reevaluate at that point.   Increase activity slowly   Complete by: As directed       Allergies as of 01/30/2021   No Known Allergies      Medication List     STOP taking these medications    furosemide 20 MG tablet Commonly known as: LASIX   lisinopril-hydrochlorothiazide 20-12.5 MG tablet Commonly known as: ZESTORETIC   omeprazole 40 MG capsule Commonly known as: PRILOSEC   potassium chloride SA 20 MEQ tablet Commonly known as: KLOR-CON M       TAKE these medications    alendronate 35 MG tablet Commonly known as: FOSAMAX Take 35 mg by mouth once a week.   allopurinol 100 MG tablet Commonly known as: ZYLOPRIM Take 100 mg by mouth daily.   atorvastatin 20 MG tablet Commonly known as: LIPITOR Take 20 mg by mouth daily.   cholestyramine 4 g packet Commonly known as: QUESTRAN Take 1 packet by mouth 2 (  two) times daily.   cholestyramine light 4 g packet Commonly known as: PREVALITE Take 1 packet (4 g total) by mouth 2 (two) times daily.   Creon 6000-19000 units Cpep Generic drug: Pancrelipase (Lip-Prot-Amyl) Take 2 capsules by mouth 3 (three) times daily.   diphenoxylate-atropine 2.5-0.025 MG tablet Commonly known as: LOMOTIL Take 1 tablet by mouth 2 (two) times daily as needed.   EPINEPHrine 0.3 mg/0.3 mL Soaj injection Commonly known as: EPI-PEN SMARTSIG:0.3 Milliliter(s) IM Once PRN   famotidine 40 MG tablet Commonly known as: PEPCID Take 40 mg by  mouth daily.   loperamide 2 MG capsule Commonly known as: IMODIUM Take 4 mg by mouth daily as needed for diarrhea or loose stools.   metoprolol tartrate 25 MG tablet Commonly known as: LOPRESSOR Take 25 mg by mouth daily.   multivitamin with minerals Tabs tablet Take 1 tablet by mouth daily.   naproxen sodium 220 MG tablet Commonly known as: ALEVE Take 220 mg by mouth daily as needed.   pantoprazole 40 MG tablet Commonly known as: PROTONIX Take 1 tablet (40 mg total) by mouth daily.   predniSONE 10 MG tablet Commonly known as: DELTASONE Take 1 tablet (10 mg total) by mouth daily with breakfast. Start taking on: January 31, 2021   sucralfate 1 g tablet Commonly known as: CARAFATE Take 1 g by mouth 3 (three) times daily. Dissolve 1 tablet in 2 ounces of water        Follow-up Information     [place], Eather Colas. Schedule an appointment as soon as possible for a visit in 1 week(s).   Specialty: Internal Medicine Contact information: [address] [place] [address] [phone]         Gastroenterology, Sadie Haber. Call in 2 week(s).   Why: Hospital follow up Contact information: [place] [address] [phone]                No Known Allergies    Consultations: GI and PCCM.   Other Procedures/Studies: DG Chest Portable 1 View  Result Date: 01/26/2021 CLINICAL DATA:  Weakness EXAM: PORTABLE CHEST 1 VIEW COMPARISON:  Chest x-ray 09/19/2020 FINDINGS: Cardiomediastinal silhouette is unchanged with tortuous ectatic thoracic aorta calcified plaques. No focal consolidation identified. No pleural effusion or pneumothorax. IMPRESSION: No acute intrathoracic process identified. Electronically Signed   By: Ofilia Neas M.D.   On: 01/26/2021 15:46   DG ESOPHAGUS W SINGLE CM (SOL OR THIN BA)  Result Date: 01/28/2021 CLINICAL DATA:  Dysphagia. Additional history provided: Rule out stricture. EXAM: ESOPHOGRAM / BARIUM  SWALLOW / BARIUM TABLET STUDY TECHNIQUE: A single contrast examination performed using barium contrast. Additionally, the patient was observed with fluoroscopy swallowing a 13 mm barium sulphate tablet. FLUOROSCOPY TIME:  Fluoroscopy Time:  2 minutes, 42 seconds Radiation Exposure Index (if provided by the fluoroscopic device): 6.3 mGy. COMPARISON:  MR abdomen 09/11/2020.  CT angiogram chest 01/28/2014. FINDINGS: Imaging acquired by Rushie Nyhan, NP. There is a focus of narrowing at the level of the cervical esophagus. A swallowed 13 mm barium tablet did not pass beyond this point, despite a prolonged period of observation. 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 There is an additional site of apparent significant narrowing at the level of the distal esophagus/GE junction, also suspicious for stricture. Elsewhere, the esophagus appears normal in caliber and smooth in contour. No significant esophageal dysmotility was observed. No appreciable hiatal hernia. No gastroesophageal reflux was observed. There was mild laryngeal  penetration at the end of the examination. IMPRESSION: Focus of apparent narrowing at the level of the cervical esophagus. 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 Additional site of apparent significant narrowing at the level of the distal esophagus/GE junction, also suspicious for a stricture. Correlation with findings at endoscopy recommended. Mild laryngeal penetration was observed at the end of the examination. A modified barium swallow examination with speech pathology is recommended to further assess for aspiration. Electronically Signed   By: Kellie Simmering D.O.   On: 01/28/2021 11:35     TODAY-DAY OF DISCHARGE:  Subjective:   Keenan Bachelor today has no headache,no chest abdominal pain,no new weakness tingling or numbness, feels much better wants to go home  today.  Objective:   Blood pressure 126/68, pulse 77, temperature 98.4 F (36.9 C), resp. rate 19, height 4\' 10"  (1.473 m), weight 39.2 kg, SpO2 98 %.  Intake/Output Summary (Last 24 hours) at 01/30/2021 1026 Last data filed at 01/30/2021 0939 Gross per 24 hour  Intake 1575.04 ml  Output 200 ml  Net 1375.04 ml   Filed Weights   01/26/21 1534 01/27/21 0500 01/29/21 0817  Weight: 35.4 kg 39.2 kg 39.2 kg    Exam: Awake Alert, Oriented *3, No new F.N deficits, Normal affect [place].AT,PERRAL Supple Neck,No JVD, No cervical lymphadenopathy appriciated.  Symmetrical Chest wall movement, Good air movement bilaterally, CTAB RRR,No Gallops,Rubs or new Murmurs, No Parasternal Heave +ve B.Sounds, Abd Soft, Non tender, No organomegaly appriciated, No rebound -guarding or rigidity. No Cyanosis, Clubbing or edema, No new Rash or bruise   PERTINENT RADIOLOGIC STUDIES: DG ESOPHAGUS W SINGLE CM (SOL OR THIN BA)  Result Date: 01/28/2021 CLINICAL DATA:  Dysphagia. Additional history provided: Rule out stricture. EXAM: ESOPHOGRAM / BARIUM SWALLOW / BARIUM TABLET STUDY TECHNIQUE: A single contrast examination performed using barium contrast. Additionally, the patient was observed with fluoroscopy swallowing a 13 mm barium sulphate tablet. FLUOROSCOPY TIME:  Fluoroscopy Time:  2 minutes, 42 seconds Radiation Exposure Index (if provided by the fluoroscopic device): 6.3 mGy. COMPARISON:  MR abdomen 09/11/2020.  CT angiogram chest 01/28/2014. FINDINGS: Imaging acquired by Rushie Nyhan, NP. There is a focus of narrowing at the level of the cervical esophagus. A swallowed 13 mm barium tablet did not pass beyond this point, despite a prolonged period of observation. 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 There is an additional site of apparent significant narrowing at the level of the distal esophagus/GE junction, also suspicious for stricture. Elsewhere, the esophagus appears normal in caliber and smooth in  contour. No significant esophageal dysmotility was observed. No appreciable hiatal hernia. No gastroesophageal reflux was observed. There was mild laryngeal penetration at the end of the examination. IMPRESSION: Focus of apparent narrowing at the level of the cervical esophagus. 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 Additional site of apparent significant narrowing at the level of the distal esophagus/GE junction, also suspicious for a stricture. Correlation with findings at endoscopy recommended. Mild laryngeal penetration was observed at the end of the examination. A modified barium swallow examination with speech pathology is recommended to further assess for aspiration. Electronically Signed   By: Kellie Simmering D.O.   On: 01/28/2021 11:35     PERTINENT LAB RESULTS: CBC: Recent Labs  01/28/21 0848 01/29/21 0550  WBC 7.5 6.7  HGB 10.6* 10.1*  HCT 32.2* 29.8*  PLT 172 148*   CMET CMP     Component Value Date/Time   NA 136 01/29/2021 0550   K 4.2 01/29/2021 0550   CL 108 01/29/2021 0550   CO2 21 (L) 01/29/2021 0550   GLUCOSE 85 01/29/2021 0550   BUN 7 (L) 01/29/2021 0550   CREATININE 0.84 01/29/2021 0550   CALCIUM 7.5 (L) 01/29/2021 0550   PROT 4.7 (L) 01/28/2021 0848   ALBUMIN 2.0 (L) 01/28/2021 0848   AST 33 01/28/2021 0848   ALT 27 01/28/2021 0848   ALKPHOS 98 01/28/2021 0848   BILITOT 0.6 01/28/2021 0848   GFRNONAA >60 01/29/2021 0550    GFR Estimated Creatinine Clearance: 34.7 mL/min (by C-G formula based on SCr of 0.84 mg/dL). No results for input(s): LIPASE, AMYLASE in the last 72 hours. No results for input(s): CKTOTAL, CKMB, CKMBINDEX, TROPONINI in the last 72 hours. Invalid input(s): POCBNP No results for input(s): DDIMER in the last 72 hours. No results for input(s): HGBA1C in the last 72 hours. No results for input(s): CHOL, HDL, LDLCALC,  TRIG, CHOLHDL, LDLDIRECT in the last 72 hours. Recent Labs    01/27/21 1712  TSH 0.559   Recent Labs    01/29/21 0550  FOLATE 30.9   Coags: No results for input(s): INR in the last 72 hours.  Invalid input(s): PT Microbiology: Recent Results (from the past 240 hour(s))  Resp Panel by RT-PCR (Flu A&B, Covid) Nasopharyngeal Swab     Status: None   Collection Time: 01/26/21  3:12 PM   Specimen: Nasopharyngeal Swab; Nasopharyngeal(NP) swabs in vial transport medium  Result Value Ref Range Status   SARS Coronavirus 2 by RT PCR NEGATIVE NEGATIVE Final    Comment: (NOTE) SARS-CoV-2 target nucleic acids are NOT DETECTED.  The SARS-CoV-2 RNA is generally detectable in upper respiratory specimens during the acute phase of infection. The lowest concentration of SARS-CoV-2 viral copies this assay can detect is 138 copies/mL. A negative result does not preclude SARS-Cov-2 infection and should not be used as the sole basis for treatment or other patient management decisions. A negative result may occur with  improper specimen collection/handling, submission of specimen other than nasopharyngeal swab, presence of viral mutation(s) within the areas targeted by this assay, and inadequate number of viral copies(<138 copies/mL). A negative result must be combined with clinical observations, patient history, and epidemiological information. The expected result is Negative.  Fact Sheet for Patients:  EntrepreneurPulse.com.au  Fact Sheet for Healthcare Providers:  IncredibleEmployment.be  This test is no t yet approved or cleared by the [place] FDA and  has been authorized for detection and/or diagnosis of SARS-CoV-2 by FDA under an Emergency Use Authorization (EUA). This EUA will remain  in effect (meaning this test can be used) for the duration of the COVID-19 declaration under Section 564(b)(1) of the Act, 21 U.S.C.section 360bbb-3(b)(1), unless  the authorization is terminated  or revoked sooner.       Influenza A by PCR NEGATIVE NEGATIVE Final   Influenza B by PCR NEGATIVE NEGATIVE Final    Comment: (NOTE) The Xpert Xpress SARS-CoV-2/FLU/RSV plus assay is intended as an aid in the diagnosis of influenza from Nasopharyngeal swab specimens and should not be used as a sole basis for treatment. Nasal washings and aspirates are unacceptable for Xpert Xpress SARS-CoV-2/FLU/RSV testing.  Fact Sheet for Patients: EntrepreneurPulse.com.au  Fact Sheet for Healthcare Providers: IncredibleEmployment.be  This test  is not yet approved or cleared by the [place] and has been authorized for detection and/or diagnosis of SARS-CoV-2 by FDA under an Emergency Use Authorization (EUA). This EUA will remain in effect (meaning this test can be used) for the duration of the COVID-19 declaration under Section 564(b)(1) of the Act, 21 U.S.C. section 360bbb-3(b)(1), unless the authorization is terminated or revoked.  Performed at [facility], [address], [place], [place] [address]   Blood culture (routine x 2)     Status: None (Preliminary result)   Collection Time: 01/26/21  3:45 PM   Specimen: BLOOD  Result Value Ref Range Status   Specimen Description BLOOD SITE NOT SPECIFIED  Final   Special Requests   Final    BOTTLES DRAWN AEROBIC AND ANAEROBIC Blood Culture adequate volume   Culture   Final    NO GROWTH 4 DAYS Performed at [facility], [address], [place], [place] [address]    Report Status PENDING  Incomplete  Blood culture (routine x 2)     Status: None (Preliminary result)   Collection Time: 01/26/21  3:50 PM   Specimen: BLOOD RIGHT FOREARM  Result Value Ref Range Status   Specimen Description BLOOD RIGHT FOREARM  Final   Special Requests   Final    BOTTLES DRAWN AEROBIC ONLY Blood Culture results may not be optimal due to an inadequate volume of blood  received in culture bottles   Culture   Final    NO GROWTH 4 DAYS Performed at [facility], [address], [place], [place] [address]    Report Status PENDING  Incomplete  C Difficile Quick Screen w PCR reflex     Status: None   Collection Time: 01/26/21  4:00 PM   Specimen: STOOL  Result Value Ref Range Status   C Diff antigen NEGATIVE NEGATIVE Final   C Diff toxin NEGATIVE NEGATIVE Final   C Diff interpretation No C. difficile detected.  Final    Comment: Performed at [facility], [address], [place], [place] [address]  Gastrointestinal Panel by PCR , Stool     Status: None   Collection Time: 01/26/21  4:00 PM   Specimen: STOOL  Result Value Ref Range Status   Campylobacter species NOT DETECTED NOT DETECTED Final   Plesimonas shigelloides NOT DETECTED NOT DETECTED Final   Salmonella species NOT DETECTED NOT DETECTED Final   Yersinia enterocolitica NOT DETECTED NOT DETECTED Final   Vibrio species NOT DETECTED NOT DETECTED Final   Vibrio cholerae NOT DETECTED NOT DETECTED Final   Enteroaggregative E coli (EAEC) NOT DETECTED NOT DETECTED Final   Enteropathogenic E coli (EPEC) NOT DETECTED NOT DETECTED Final   Enterotoxigenic E coli (ETEC) NOT DETECTED NOT DETECTED Final   Shiga like toxin producing E coli (STEC) NOT DETECTED NOT DETECTED Final   Shigella/Enteroinvasive E coli (EIEC) NOT DETECTED NOT DETECTED Final   Cryptosporidium NOT DETECTED NOT DETECTED Final   Cyclospora cayetanensis NOT DETECTED NOT DETECTED Final   Entamoeba histolytica NOT DETECTED NOT DETECTED Final   Giardia lamblia NOT DETECTED NOT DETECTED Final   Adenovirus F40/41 NOT DETECTED NOT DETECTED Final   Astrovirus NOT DETECTED NOT DETECTED Final   Norovirus GI/GII NOT DETECTED NOT DETECTED Final   Rotavirus A NOT DETECTED NOT DETECTED Final   Sapovirus (I, II, IV, and V) NOT DETECTED NOT DETECTED Final    Comment: Performed at [facility], [address]  [place], [place]  [address]  MRSA Next Gen by PCR, Nasal     Status: None   Collection Time: 01/26/21  6:38 PM   Specimen: Nasal Mucosa; Nasal Swab  Result Value Ref Range Status   MRSA by PCR Next Gen NOT DETECTED NOT DETECTED Final    Comment: (NOTE) The GeneXpert MRSA Assay (FDA approved for NASAL specimens only), is one component of a comprehensive MRSA colonization surveillance program. It is not intended to diagnose MRSA infection nor to guide or monitor treatment for MRSA infections. Test performance is not FDA approved in patients less than 54 years old. Performed at [facility], [place] [address], [place], [place] [address]     FURTHER DISCHARGE INSTRUCTIONS:  Get Medicines reviewed and adjusted: Please take all your medications with you for your next visit with your Primary MD  Laboratory/radiological data: Please request your Primary MD to go over all hospital tests and procedure/radiological results at the follow up, please ask your Primary MD to get all Hospital records sent to his/her office.  In some cases, they will be blood work, cultures and biopsy results pending at the time of your discharge. Please request that your primary care M.D. goes through all the records of your hospital data and follows up on these results.  Also Note the following: If you experience worsening of your admission symptoms, develop shortness of breath, life threatening emergency, suicidal or homicidal thoughts you must seek medical attention immediately by calling 911 or calling your MD immediately  if symptoms less severe.  You must read complete instructions/literature along with all the possible adverse reactions/side effects for all the Medicines you take and that have been prescribed to you. Take any new Medicines after you have completely understood and accpet all the possible adverse reactions/side effects.   Do not drive when taking Pain medications or sleeping medications (Benzodaizepines)  Do not  take more than prescribed Pain, Sleep and Anxiety Medications. It is not advisable to combine anxiety,sleep and pain medications without talking with your primary care practitioner  Special Instructions: If you have smoked or chewed Tobacco  in the last 2 yrs please stop smoking, stop any regular Alcohol  and or any Recreational drug use.  Wear Seat belts while driving.  Please note: You were cared for by a hospitalist during your hospital stay. Once you are discharged, your primary care physician will handle any further medical issues. Please note that NO REFILLS for any discharge medications will be authorized once you are discharged, as it is imperative that you return to your primary care physician (or establish a relationship with a primary care physician if you do not have one) for your post hospital discharge needs so that they can reassess your need for medications and monitor your lab values.  Total Time spent coordinating discharge including counseling, education and face to face time equals 35 minutes.  SignedOren Binet 01/30/2021 10:26 AM

## 2021-01-30 NOTE — Progress Notes (Signed)
Wanda Valdez to be D/C'd  per MD order.  Discussed with the patient and all questions fully answered.  VSS, Skin clean, dry and intact without evidence of skin break down, no evidence of skin tears noted.  IV catheter discontinued intact. Site without signs and symptoms of complications. Dressing and pressure applied.  An After Visit Summary was printed and given to the patient.   D/c re-education completed with patient/family including follow up instructions, medication list, d/c activities limitations if indicated, with other d/c instructions as indicated by MD - patient able to verbalize understanding, all questions fully answered.   Patient instructed to return to ED, call 911, or call MD for any changes in condition.   Patient to be escorted via WC, and D/C home via private auto.

## 2021-01-31 LAB — CULTURE, BLOOD (ROUTINE X 2)
Culture: NO GROWTH
Culture: NO GROWTH
Special Requests: ADEQUATE

## 2021-02-05 ENCOUNTER — Encounter (HOSPITAL_COMMUNITY): Payer: Self-pay

## 2021-02-05 ENCOUNTER — Ambulatory Visit (HOSPITAL_COMMUNITY)
Admission: RE | Admit: 2021-02-05 | Discharge: 2021-02-05 | Disposition: A | Discharge status: Home / Self Care | Payer: Medicare Other | Source: Ambulatory Visit | Attending: Gastroenterology | Admitting: Gastroenterology

## 2021-02-05 ENCOUNTER — Other Ambulatory Visit: Payer: Self-pay

## 2021-02-05 DIAGNOSIS — R1319 Other dysphagia: Secondary | ICD-10-CM

## 2022-01-01 ENCOUNTER — Inpatient Hospital Stay (HOSPITAL_COMMUNITY)
Admission: EM | Admit: 2022-01-01 | Discharge: 2022-01-09 | DRG: 064 | Disposition: A | Discharge status: Home Health | Payer: Medicare Other | Attending: Internal Medicine | Admitting: Internal Medicine

## 2022-01-01 ENCOUNTER — Other Ambulatory Visit: Payer: Self-pay

## 2022-01-01 ENCOUNTER — Emergency Department (HOSPITAL_COMMUNITY): Payer: Medicare Other

## 2022-01-01 ENCOUNTER — Inpatient Hospital Stay (HOSPITAL_COMMUNITY): Payer: Medicare Other

## 2022-01-01 ENCOUNTER — Encounter (HOSPITAL_COMMUNITY): Payer: Self-pay | Admitting: Emergency Medicine

## 2022-01-01 DIAGNOSIS — G928 Other toxic encephalopathy: Secondary | ICD-10-CM

## 2022-01-01 DIAGNOSIS — Z79899 Other long term (current) drug therapy: Secondary | ICD-10-CM

## 2022-01-01 DIAGNOSIS — F05 Delirium due to known physiological condition: Secondary | ICD-10-CM | POA: Diagnosis present

## 2022-01-01 DIAGNOSIS — G8194 Hemiplegia, unspecified affecting left nondominant side: Secondary | ICD-10-CM | POA: Diagnosis present

## 2022-01-01 DIAGNOSIS — R739 Hyperglycemia, unspecified: Secondary | ICD-10-CM | POA: Diagnosis present

## 2022-01-01 DIAGNOSIS — A419 Sepsis, unspecified organism: Secondary | ICD-10-CM | POA: Diagnosis not present

## 2022-01-01 DIAGNOSIS — F17210 Nicotine dependence, cigarettes, uncomplicated: Secondary | ICD-10-CM | POA: Diagnosis present

## 2022-01-01 DIAGNOSIS — Z8719 Personal history of other diseases of the digestive system: Secondary | ICD-10-CM

## 2022-01-01 DIAGNOSIS — D696 Thrombocytopenia, unspecified: Secondary | ICD-10-CM | POA: Diagnosis present

## 2022-01-01 DIAGNOSIS — N1831 Chronic kidney disease, stage 3a: Secondary | ICD-10-CM | POA: Diagnosis present

## 2022-01-01 DIAGNOSIS — Z87440 Personal history of urinary (tract) infections: Secondary | ICD-10-CM

## 2022-01-01 DIAGNOSIS — M25511 Pain in right shoulder: Secondary | ICD-10-CM | POA: Diagnosis present

## 2022-01-01 DIAGNOSIS — R441 Visual hallucinations: Secondary | ICD-10-CM | POA: Diagnosis present

## 2022-01-01 DIAGNOSIS — R29704 NIHSS score 4: Secondary | ICD-10-CM | POA: Diagnosis present

## 2022-01-01 DIAGNOSIS — I129 Hypertensive chronic kidney disease with stage 1 through stage 4 chronic kidney disease, or unspecified chronic kidney disease: Secondary | ICD-10-CM | POA: Diagnosis present

## 2022-01-01 DIAGNOSIS — I9589 Other hypotension: Secondary | ICD-10-CM | POA: Diagnosis present

## 2022-01-01 DIAGNOSIS — E785 Hyperlipidemia, unspecified: Secondary | ICD-10-CM | POA: Diagnosis present

## 2022-01-01 DIAGNOSIS — I771 Stricture of artery: Secondary | ICD-10-CM | POA: Diagnosis present

## 2022-01-01 DIAGNOSIS — I63411 Cerebral infarction due to embolism of right middle cerebral artery: Secondary | ICD-10-CM | POA: Diagnosis present

## 2022-01-01 DIAGNOSIS — I6523 Occlusion and stenosis of bilateral carotid arteries: Secondary | ICD-10-CM | POA: Diagnosis present

## 2022-01-01 DIAGNOSIS — Z8249 Family history of ischemic heart disease and other diseases of the circulatory system: Secondary | ICD-10-CM

## 2022-01-01 DIAGNOSIS — E872 Acidosis, unspecified: Secondary | ICD-10-CM | POA: Diagnosis present

## 2022-01-01 DIAGNOSIS — N136 Pyonephrosis: Secondary | ICD-10-CM | POA: Diagnosis present

## 2022-01-01 DIAGNOSIS — R578 Other shock: Secondary | ICD-10-CM | POA: Diagnosis present

## 2022-01-01 DIAGNOSIS — Z7983 Long term (current) use of bisphosphonates: Secondary | ICD-10-CM | POA: Diagnosis not present

## 2022-01-01 DIAGNOSIS — Z90710 Acquired absence of both cervix and uterus: Secondary | ICD-10-CM

## 2022-01-01 DIAGNOSIS — I6521 Occlusion and stenosis of right carotid artery: Secondary | ICD-10-CM | POA: Diagnosis present

## 2022-01-01 DIAGNOSIS — R54 Age-related physical debility: Secondary | ICD-10-CM | POA: Diagnosis present

## 2022-01-01 DIAGNOSIS — R579 Shock, unspecified: Secondary | ICD-10-CM | POA: Diagnosis present

## 2022-01-01 DIAGNOSIS — T8089XA Other complications following infusion, transfusion and therapeutic injection, initial encounter: Secondary | ICD-10-CM | POA: Diagnosis not present

## 2022-01-01 DIAGNOSIS — H547 Unspecified visual loss: Secondary | ICD-10-CM | POA: Diagnosis present

## 2022-01-01 DIAGNOSIS — E8729 Other acidosis: Secondary | ICD-10-CM | POA: Diagnosis not present

## 2022-01-01 DIAGNOSIS — E860 Dehydration: Secondary | ICD-10-CM | POA: Diagnosis present

## 2022-01-01 DIAGNOSIS — D631 Anemia in chronic kidney disease: Secondary | ICD-10-CM | POA: Diagnosis present

## 2022-01-01 DIAGNOSIS — N3000 Acute cystitis without hematuria: Secondary | ICD-10-CM | POA: Diagnosis not present

## 2022-01-01 DIAGNOSIS — M79632 Pain in left forearm: Secondary | ICD-10-CM | POA: Diagnosis not present

## 2022-01-01 DIAGNOSIS — R569 Unspecified convulsions: Secondary | ICD-10-CM

## 2022-01-01 DIAGNOSIS — E861 Hypovolemia: Secondary | ICD-10-CM

## 2022-01-01 DIAGNOSIS — N179 Acute kidney failure, unspecified: Secondary | ICD-10-CM | POA: Diagnosis present

## 2022-01-01 DIAGNOSIS — Y828 Other medical devices associated with adverse incidents: Secondary | ICD-10-CM | POA: Diagnosis not present

## 2022-01-01 DIAGNOSIS — E538 Deficiency of other specified B group vitamins: Secondary | ICD-10-CM | POA: Diagnosis present

## 2022-01-01 DIAGNOSIS — I6389 Other cerebral infarction: Secondary | ICD-10-CM | POA: Diagnosis not present

## 2022-01-01 DIAGNOSIS — Z7952 Long term (current) use of systemic steroids: Secondary | ICD-10-CM

## 2022-01-01 DIAGNOSIS — I708 Atherosclerosis of other arteries: Secondary | ICD-10-CM | POA: Diagnosis present

## 2022-01-01 DIAGNOSIS — D509 Iron deficiency anemia, unspecified: Secondary | ICD-10-CM | POA: Diagnosis present

## 2022-01-01 DIAGNOSIS — R4182 Altered mental status, unspecified: Secondary | ICD-10-CM | POA: Diagnosis present

## 2022-01-01 DIAGNOSIS — K921 Melena: Secondary | ICD-10-CM | POA: Diagnosis not present

## 2022-01-01 DIAGNOSIS — M25512 Pain in left shoulder: Secondary | ICD-10-CM | POA: Diagnosis present

## 2022-01-01 DIAGNOSIS — R471 Dysarthria and anarthria: Secondary | ICD-10-CM | POA: Diagnosis present

## 2022-01-01 LAB — TSH: TSH: 1.418 u[IU]/mL (ref 0.350–4.500)

## 2022-01-01 LAB — COMPREHENSIVE METABOLIC PANEL
ALT: 15 U/L (ref 0–44)
AST: 31 U/L (ref 15–41)
Albumin: 3.3 g/dL — ABNORMAL LOW (ref 3.5–5.0)
Alkaline Phosphatase: 87 U/L (ref 38–126)
Anion gap: 22 — ABNORMAL HIGH (ref 5–15)
BUN: 13 mg/dL (ref 8–23)
CO2: 13 mmol/L — ABNORMAL LOW (ref 22–32)
Calcium: 9.9 mg/dL (ref 8.9–10.3)
Chloride: 107 mmol/L (ref 98–111)
Creatinine, Ser: 1.6 mg/dL — ABNORMAL HIGH (ref 0.44–1.00)
GFR, Estimated: 33 mL/min — ABNORMAL LOW (ref >60)
Glucose, Bld: 215 mg/dL — ABNORMAL HIGH (ref 70–99)
Potassium: 5 mmol/L (ref 3.5–5.1)
Sodium: 142 mmol/L (ref 135–145)
Total Bilirubin: 0.7 mg/dL (ref 0.3–1.2)
Total Protein: 6.3 g/dL — ABNORMAL LOW (ref 6.5–8.1)

## 2022-01-01 LAB — CBC
HCT: 40.6 % (ref 36.0–46.0)
Hemoglobin: 13.2 g/dL (ref 12.0–15.0)
MCH: 34 pg (ref 26.0–34.0)
MCHC: 32.5 g/dL (ref 30.0–36.0)
MCV: 104.6 fL — ABNORMAL HIGH (ref 80.0–100.0)
Platelets: 262 10*3/uL (ref 150–400)
RBC: 3.88 MIL/uL (ref 3.87–5.11)
RDW: 14.6 % (ref 11.5–15.5)
WBC: 8 10*3/uL (ref 4.0–10.5)
nRBC: 0 % (ref 0.0–0.2)

## 2022-01-01 LAB — DIFFERENTIAL
Abs Immature Granulocytes: 0.01 10*3/uL (ref 0.00–0.07)
Basophils Absolute: 0.1 10*3/uL (ref 0.0–0.1)
Basophils Relative: 1 %
Eosinophils Absolute: 0.8 10*3/uL — ABNORMAL HIGH (ref 0.0–0.5)
Eosinophils Relative: 9 %
Immature Granulocytes: 0 %
Lymphocytes Relative: 41 %
Lymphs Abs: 3.2 10*3/uL (ref 0.7–4.0)
Monocytes Absolute: 1 10*3/uL (ref 0.1–1.0)
Monocytes Relative: 12 %
Neutro Abs: 3 10*3/uL (ref 1.7–7.7)
Neutrophils Relative %: 37 %

## 2022-01-01 LAB — AMMONIA: Ammonia: 33 umol/L (ref 9–35)

## 2022-01-01 LAB — PROTIME-INR
INR: 1 (ref 0.8–1.2)
Prothrombin Time: 13.3 seconds (ref 11.4–15.2)

## 2022-01-01 LAB — I-STAT CHEM 8, ED
BUN: 10 mg/dL (ref 8–23)
Calcium, Ion: 1.11 mmol/L — ABNORMAL LOW (ref 1.15–1.40)
Chloride: 110 mmol/L (ref 98–111)
Creatinine, Ser: 1.3 mg/dL — ABNORMAL HIGH (ref 0.44–1.00)
Glucose, Bld: 211 mg/dL — ABNORMAL HIGH (ref 70–99)
HCT: 41 % (ref 36.0–46.0)
Hemoglobin: 13.9 g/dL (ref 12.0–15.0)
Potassium: 4.8 mmol/L (ref 3.5–5.1)
Sodium: 139 mmol/L (ref 135–145)
TCO2: 14 mmol/L — ABNORMAL LOW (ref 22–32)

## 2022-01-01 LAB — ETHANOL: Alcohol, Ethyl (B): <10 mg/dL (ref <10)

## 2022-01-01 LAB — LACTIC ACID, PLASMA
Lactic Acid, Venous: 4.1 mmol/L (ref 0.5–1.9)
Lactic Acid, Venous: 2 mmol/L (ref 0.5–1.9)

## 2022-01-01 LAB — CBG MONITORING, ED: Glucose-Capillary: 217 mg/dL — ABNORMAL HIGH (ref 70–99)

## 2022-01-01 LAB — VITAMIN B12: Vitamin B-12: 316 pg/mL (ref 180–914)

## 2022-01-01 LAB — APTT: aPTT: 29 seconds (ref 24–36)

## 2022-01-01 MED ORDER — VANCOMYCIN HCL 750 MG/150ML IV SOLN
750.0000 mg | Freq: Once | INTRAVENOUS | Status: AC
  Administered 2022-01-01: 750 mg via INTRAVENOUS
  Filled 2022-01-01: qty 150

## 2022-01-01 MED ORDER — VANCOMYCIN HCL IN DEXTROSE 1-5 GM/200ML-% IV SOLN
1000.0000 mg | Freq: Once | INTRAVENOUS | Status: DC
  Filled 2022-01-01: qty 200

## 2022-01-01 MED ORDER — DEXTROSE 5 % IV SOLN
500.0000 mg | INTRAVENOUS | Status: DC
  Filled 2022-01-01: qty 5

## 2022-01-01 MED ORDER — SODIUM CHLORIDE 0.9 % IV SOLN: 2.0000 g | INTRAVENOUS | Status: DC

## 2022-01-01 MED ORDER — SODIUM CHLORIDE 0.9 % IV SOLN
2.0000 g | Freq: Once | INTRAVENOUS | Status: DC
  Filled 2022-01-01: qty 12.5

## 2022-01-01 MED ORDER — NOREPINEPHRINE 4 MG/250ML-% IV SOLN
0.0000 ug/min | INTRAVENOUS | Status: DC
  Administered 2022-01-01: 2 ug/min via INTRAVENOUS
  Filled 2022-01-01: qty 250

## 2022-01-01 MED ORDER — SODIUM CHLORIDE 0.9 % IV SOLN
INTRAVENOUS | Status: DC | PRN
  Administered 2022-01-01: 1000 mL via INTRAVENOUS

## 2022-01-01 MED ORDER — VANCOMYCIN HCL 750 MG/150ML IV SOLN
750.0000 mg | Freq: Once | INTRAVENOUS | Status: DC
  Filled 2022-01-01: qty 150

## 2022-01-01 MED ORDER — VANCOMYCIN HCL 500 MG/100ML IV SOLN
500.0000 mg | INTRAVENOUS | Status: DC
  Administered 2022-01-03: 500 mg via INTRAVENOUS
  Filled 2022-01-01: qty 100

## 2022-01-01 MED ORDER — LORAZEPAM 2 MG/ML IJ SOLN
INTRAMUSCULAR | Status: AC
  Administered 2022-01-01: 1 mg via INTRAVENOUS
  Filled 2022-01-01: qty 1

## 2022-01-01 MED ORDER — IOHEXOL 350 MG/ML SOLN
75.0000 mL | Freq: Once | INTRAVENOUS | Status: AC | PRN
  Administered 2022-01-01: 75 mL via INTRAVENOUS

## 2022-01-01 MED ORDER — LEVETIRACETAM IN NACL 1500 MG/100ML IV SOLN
1500.0000 mg | Freq: Once | INTRAVENOUS | Status: AC
  Administered 2022-01-01: 1500 mg via INTRAVENOUS
  Filled 2022-01-01: qty 100

## 2022-01-01 MED ORDER — METRONIDAZOLE 500 MG/100ML IV SOLN
500.0000 mg | Freq: Once | INTRAVENOUS | Status: AC
  Administered 2022-01-01: 500 mg via INTRAVENOUS
  Filled 2022-01-01: qty 100

## 2022-01-01 MED ORDER — LACTATED RINGERS IV SOLN: INTRAVENOUS | Status: DC

## 2022-01-01 MED ORDER — SODIUM BICARBONATE 8.4 % IV SOLN
150.0000 meq | Freq: Once | INTRAVENOUS | Status: AC
  Administered 2022-01-01: 150 meq via INTRAVENOUS
  Filled 2022-01-01: qty 50

## 2022-01-01 MED ORDER — LACTATED RINGERS IV BOLUS (SEPSIS)
500.0000 mL | Freq: Once | INTRAVENOUS | Status: AC
  Administered 2022-01-01: 500 mL via INTRAVENOUS

## 2022-01-01 MED ORDER — SODIUM CHLORIDE 0.9 % IV SOLN
2.0000 g | INTRAVENOUS | Status: DC
  Administered 2022-01-01 – 2022-01-04 (×4): 2 g via INTRAVENOUS
  Filled 2022-01-01 (×4): qty 12.5

## 2022-01-01 MED ORDER — LORAZEPAM 2 MG/ML IJ SOLN: 1.0000 mg | Freq: Once | INTRAMUSCULAR | Status: AC

## 2022-01-01 NOTE — Sepsis Progress Note (Addendum)
Notified provider of need to order fluid bolus.  Per MD, patient received 1L NS & 500 ml LR.

## 2022-01-01 NOTE — ED Notes (Signed)
[phone] pt mom would like a update.

## 2022-01-01 NOTE — Sepsis Progress Note (Signed)
Elink monitoring for the code sepsis protocol.  

## 2022-01-01 NOTE — Consult Note (Signed)
Neurology Consultation  Reason for Consult: Code stroke for headache and blurred vision along with generalized shaking  Referring Physician: Dr. Fredderick Phenix  CC: Headache blurred vision whole body shaking  History is obtained from: Patient chart  HPI: Wanda Valdez is a 76 y.o. female past medical history of hypertension, tobacco abuse, chronic diarrhea, dysphagia, poor p.o. intake, admission in December 22 for hypovolemic shock due to poor p.o. intake, AKI-presenting to the emergency room via EMS as a code stroke for dizziness, headache and blurred vision that started sometime this afternoon.  Best last known well is at noon today. After this patient started having some dizziness, headache and blurred vision.  Symptoms did not resolve for which EMS was called. They noted her to be having generalized body jerking movements, she was able to talk to them while these were happening.  They also noted her to be palpably very warm although thermometer did not read fever temperatures. She was brought in as a code stroke.  ED course-hypotensive with systolics in the 60s to 70s at best. Seen and examined at the bridge.  Given 1 mg of Ativan due to whole body jerking movements.  Noncontrasted head CT obtained-reviewed personally.  No acute changes.  CT angio head and neck obtained-reviewed personally-no emergent LVO.  CT perfusion with right-hemispheric watershed looking perfusion deficit.   LKW: 12 PM today IV thrombolysis given?: no, outside the window Premorbid modified Rankin scale (mRS): 3  ROS: Full ROS was performed and is negative except as noted in the HPI   Past Medical History:  Diagnosis Date   Hypertension      Family History  Problem Relation Age of Onset   Cirrhosis Mother    Heart attack Father      Social History:   reports that she has been smoking cigarettes. She has never used smokeless tobacco. She reports current alcohol use. She reports that she does not use  drugs.  Medications  Current Facility-Administered Medications:    0.9 %  sodium chloride infusion, , Intravenous, Continuous PRN, Rolan Bucco, MD, Last Rate: 999 mL/hr at 01/01/22 1929, 1,000 mL at 01/01/22 1929   ceFEPIme (MAXIPIME) 2 g in sodium chloride 0.9 % 100 mL IVPB, 2 g, Intravenous, Once, Stephanie Coup, MD   lactated ringers bolus 500 mL, 500 mL, Intravenous, Once, Stephanie Coup, MD   lactated ringers infusion, , Intravenous, Continuous, Lajean Silvius, Dominique, MD   metroNIDAZOLE (FLAGYL) IVPB 500 mg, 500 mg, Intravenous, Once, Stephanie Coup, MD   norepinephrine (LEVOPHED) 4mg  in (0.016 mg/mL) premix infusion, 0-40 mcg/min, Intravenous, Continuous, , Dominique, MD   vancomycin (VANCOCIN) IVPB 1000 mg/200 mL premix, 1,000 mg, Intravenous, Once, Lajean Silvius, MD  Current Outpatient Medications:    alendronate (FOSAMAX) 35 MG tablet, Take 35 mg by mouth once a week., Disp: , Rfl:    allopurinol (ZYLOPRIM) 100 MG tablet, Take 100 mg by mouth daily., Disp: , Rfl:    atorvastatin (LIPITOR) 20 MG tablet, Take 20 mg by mouth daily., Disp: , Rfl:    cholestyramine (QUESTRAN) 4 g packet, Take 1 packet by mouth 2 (two) times daily., Disp: , Rfl:    cholestyramine light (PREVALITE) 4 g packet, Take 1 packet (4 g total) by mouth 2 (two) times daily., Disp: 30 each, Rfl: 0   CREON 6000-19000 units CPEP, Take 2 capsules by mouth 3 (three) times daily., Disp: , Rfl:    diphenoxylate-atropine (LOMOTIL) 2.5-0.025 MG tablet, Take 1 tablet by mouth 2 (two) times daily as needed.,  Disp: , Rfl:    EPINEPHrine 0.3 mg/0.3 mL IJ SOAJ injection, SMARTSIG:0.3 Milliliter(s) IM Once PRN, Disp: , Rfl:    famotidine (PEPCID) 40 MG tablet, Take 40 mg by mouth daily., Disp: , Rfl:    loperamide (IMODIUM) 2 MG capsule, Take 4 mg by mouth daily as needed for diarrhea or loose stools., Disp: , Rfl:    metoprolol tartrate (LOPRESSOR) 25 MG tablet, Take 25 mg by mouth daily., Disp: ,  Rfl:    Multiple Vitamin (MULTIVITAMIN WITH MINERALS) TABS tablet, Take 1 tablet by mouth daily., Disp: , Rfl:    naproxen sodium (ALEVE) 220 MG tablet, Take 220 mg by mouth daily as needed., Disp: , Rfl:    pantoprazole (PROTONIX) 40 MG tablet, Take 1 tablet (40 mg total) by mouth daily., Disp: 30 tablet, Rfl: 0   predniSONE (DELTASONE) 10 MG tablet, Take 1 tablet (10 mg total) by mouth daily with breakfast., Disp: 30 tablet, Rfl: 1   sucralfate (CARAFATE) 1 g tablet, Take 1 g by mouth 3 (three) times daily. Dissolve 1 tablet in 2 ounces of water, Disp: , Rfl:    Exam: Current vital signs: BP (!) 70/51   Pulse 97   Temp 100.3 F (37.9 C) (Rectal)   Resp 19   Ht 4\' 10"  (1.473 m)   Wt 41.6 kg   SpO2 94%   BMI 19.17 kg/m  Vital signs in last 24 hours: Temp:  [99.3 F (37.4 C)-100.3 F (37.9 C)] 100.3 F (37.9 C) (11/02 1931) Pulse Rate:  [69-112] 97 (11/02 1931) Resp:  [19-25] 19 (11/02 1931) BP: (68-155)/(51-94) 70/51 (11/02 1931) SpO2:  [94 %-100 %] 94 % (11/02 1931) Weight:  [41.6 kg] 41.6 kg (11/02 1903) General: Patient awake, alert HEENT: Normocephalic atraumatic Lungs: Clear Cardiovascular: Regular rate rhythm Abdomen nondistended, lean looking, nontender Neurological exam She is awake alert able to tell her name She was able to tell me the month correctly Her speech is moderately dysarthric There is no evidence of aphasia but she has poor attention concentration Cranial nerves II to XII appear intact Motor examination with generalized weakness and inability to raise any of the extremities antigravity Sensation: Intact Coordination difficult to assess given her inability to move her extremities well NIHSS 1a Level of Conscious.: 0 1b LOC Questions: 2 1c LOC Commands: 0 2 Best Gaze: 0 3 Visual: 0 4 Facial Palsy: 0 5a Motor Arm - left: 2 5b Motor Arm - Right: 2 6a Motor Leg - Left: 3 6b Motor Leg - Right: 3 7 Limb Ataxia: 0 8 Sensory: 0 9 Best Language:  0 10 Dysarthria: 2 11 Extinct. and Inatten.: 0 TOTAL: 14   Labs I have reviewed labs in epic and the results pertinent to this consultation are:  CBC    Component Value Date/Time   WBC 8.0 01/01/2022 1900   RBC 3.88 01/01/2022 1900   HGB 13.9 01/01/2022 1905   HCT 41.0 01/01/2022 1905   PLT 262 01/01/2022 1900   MCV 104.6 (H) 01/01/2022 1900   MCH 34.0 01/01/2022 1900   MCHC 32.5 01/01/2022 1900   RDW 14.6 01/01/2022 1900   LYMPHSABS 3.2 01/01/2022 1900   MONOABS 1.0 01/01/2022 1900   EOSABS 0.8 (H) 01/01/2022 1900   BASOSABS 0.1 01/01/2022 1900    CMP     Component Value Date/Time   NA 139 01/01/2022 1905   K 4.8 01/01/2022 1905   CL 110 01/01/2022 1905   CO2 13 (L) 01/01/2022 1900  GLUCOSE 211 (H) 01/01/2022 1905   BUN 10 01/01/2022 1905   CREATININE 1.30 (H) 01/01/2022 1905   CALCIUM 9.9 01/01/2022 1900   PROT 6.3 (L) 01/01/2022 1900   ALBUMIN 3.3 (L) 01/01/2022 1900   AST 31 01/01/2022 1900   ALT 15 01/01/2022 1900   ALKPHOS 87 01/01/2022 1900   BILITOT 0.7 01/01/2022 1900   GFRNONAA 33 (L) 01/01/2022 1900   Imaging I have reviewed the images obtained:  CT-head: No acute changes CT angio head and neck: Preliminary review with no emergent LVO, but has 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   Assessment:  76 year old with above past medical history presenting for evaluation of dizziness, blurred vision headache and generalized weakness.  Noted to be hypotensive on arrival and generally weak with noncontrasted head CT unremarkable for acute process but CT perfusion studies showing hypoperfusion in the right cerebral hemisphere in the setting of a significantly stenosed right innominate. Likely cerebral hypoperfusion versus toxic metabolic encephalopathy causing asterixis or myoclonic jerking  movements in the setting of deranged renal function-last creatinine was normal today is 1.38.  Impression Cerebral hypoperfusion in the setting of hypotension Evaluate for underlying causes of hypertension including sepsis Evaluate for underlying seizures Evaluate for toxic metabolic encephalopathy   Recommendations: I have ordered a stat EEG to be done to rule out ongoing seizures Given 1 mg Ativan IV and loaded with Keppra 1 g IV.  Hold off on further antiepileptics for now until EEG is done. Check urinalysis and drug screen Check B12, TSH, ammonia levels. I would also check thiamine levels given her poor p.o. intake Sepsis work-up per primary team. MRI of the brain without contrast when able to As said above, not a candidate for IV thrombolysis due to being outside the window and not a candidate for EVT due to no emergent LVO corresponding to symptoms.  Neurology will follow Plan discussed with Dr. Fredderick Phenix   -- Milon Dikes, MD Neurologist [facility] Pager: [phone]  CRITICAL CARE ATTESTATION Performed by: Milon Dikes, MD Total critical care time: 40 minutes Critical care time was exclusive of separately billable procedures and treating other patients and/or supervising APPs/Residents/Students Critical care was necessary to treat or prevent imminent or life-threatening deterioration due to toxic metabolic encephalopathy versus cerebral hypoperfusion This patient is critically ill and at significant risk for neurological worsening and/or death and care requires constant monitoring. Critical care was time spent personally by me on the following activities: development of treatment plan with patient and/or surrogate as well as nursing, discussions with consultants, evaluation of patient's response to treatment, examination of patient, obtaining history from patient or surrogate, ordering and performing treatments and interventions, ordering and review of laboratory  studies, ordering and review of radiographic studies, pulse oximetry, re-evaluation of patient's condition, participation in multidisciplinary rounds and medical decision making of high complexity in the care of this patient.

## 2022-01-01 NOTE — Code Documentation (Signed)
Stroke Response Nurse Documentation Code Documentation  Wanda Valdez is a 76 y.o. female arriving to [facility]  via [facility] on 01/01/22 with past medical hx of HTN, CAD. On No antithrombotic. Code stroke was activated by EMS.   Patient from home where she was LKW at 1200. States she woke up this morning with a headache and blurred vision which improved throughout the morning but restarted around 1200. She also felt to be febrile so took some Tylenol and turned on her furnace. Family noticed she was shaking and having left sided weakness on their arrival so they called EMS. Patient still shaking but A&O on arrival, 1mg  Ativan given at 1902 with little resolve.   Stroke team at the bedside on patient arrival. Labs drawn and patient cleared for CT by EDP. Patient to CT with team. NIHSS 4, see documentation for details and code stroke times. Patient with bilateral leg weakness on exam. The following imaging was completed:  CT Head, CTA, and CTP. Patient is not a candidate for IV Thrombolytic due to no focal deficits. Patient is not a candidate for IR due to no suspected LVO.   Care Plan: Q2 NIHSS/VS   Bedside handoff with ED RN Joellen Jersey.    Meda Klinefelter  Stroke Response RN

## 2022-01-01 NOTE — ED Provider Notes (Signed)
[facility] EMERGENCY DEPARTMENT Provider Note   CSN: [id number] Arrival date & time: 01/01/22  1857     History  Chief Complaint  Patient presents with   Code Stroke   Wanda Valdez is a 76 year old female with history of hypertension, AVM of colon, bilateral carotid artery stenosis, iron deficiency anemia who presents to the emergency department as a code stroke.  Per EMS, the patient woke up feeling more her normal self this morning, but then around 12 to 12:15 PM she began having a headache and blurred vision.  She also felt feverish at home, took Tylenol.  At approximately 6:15 PM, she started having seizure-like activity with jerking movements of her left arm and leg.  She was alert and talking throughout these movements with EMS.  Per EMS, she has no history of seizure or neurologic disease.   The history is provided by the patient, the EMS personnel and medical records.       Home Medications Prior to Admission medications   Medication Sig Start Date End Date Taking? Authorizing Provider  alendronate (FOSAMAX) 35 MG tablet Take 35 mg by mouth once a week. 03/28/20   [provider]  allopurinol (ZYLOPRIM) 100 MG tablet Take 100 mg by mouth daily. 08/10/20   [provider]  atorvastatin (LIPITOR) 20 MG tablet Take 20 mg by mouth daily. 10/01/20   [provider]  cholestyramine (QUESTRAN) 4 g packet Take 1 packet by mouth 2 (two) times daily. 10/10/20   [provider]  cholestyramine light (PREVALITE) 4 g packet Take 1 packet (4 g total) by mouth 2 (two) times daily. 09/22/20   Cipriano Bunker, MD  CREON 6000-19000 units CPEP Take 2 capsules by mouth 3 (three) times daily. 05/11/20   [provider]  diphenoxylate-atropine (LOMOTIL) 2.5-0.025 MG tablet Take 1 tablet by mouth 2 (two) times daily as needed. 12/16/20   [provider]  EPINEPHrine 0.3 mg/0.3 mL IJ SOAJ injection SMARTSIG:0.3 Milliliter(s) IM  Once PRN 10/01/20   [provider]  famotidine (PEPCID) 40 MG tablet Take 40 mg by mouth daily. 10/01/20   [provider]  loperamide (IMODIUM) 2 MG capsule Take 4 mg by mouth daily as needed for diarrhea or loose stools.    [provider]  metoprolol tartrate (LOPRESSOR) 25 MG tablet Take 25 mg by mouth daily. 01/01/21   [provider]  Multiple Vitamin (MULTIVITAMIN WITH MINERALS) TABS tablet Take 1 tablet by mouth daily. 09/14/20   Pahwani, Kasandra Knudsen, MD  naproxen sodium (ALEVE) 220 MG tablet Take 220 mg by mouth daily as needed.    [provider]  pantoprazole (PROTONIX) 40 MG tablet Take 1 tablet (40 mg total) by mouth daily. 09/14/20   Pahwani, Kasandra Knudsen, MD  predniSONE (DELTASONE) 10 MG tablet Take 1 tablet (10 mg total) by mouth daily with breakfast. 01/31/21   Ghimire, Werner Lean, MD  sucralfate (CARAFATE) 1 g tablet Take 1 g by mouth 3 (three) times daily. Dissolve 1 tablet in 2 ounces of water 05/27/20   [provider]      Allergies    Patient has no known allergies.    Review of Systems    Review of Systems See HPI.   Physical Exam Updated Vital Signs BP (!) 142/85   Pulse 71   Temp 100.3 F (37.9 C) (Rectal)   Resp 16   Ht  (1.473 m)   Wt 41.6 kg   SpO2  93%   BMI 19.17 kg/m   Physical Exam General: Patient awake, alert HEENT: Normocephalic atraumatic Lungs: Clear to auscultation bilaterally. Cardiovascular: Regular rate rhythm Abdomen: Soft, nondistended, nontender. Neuro:  She is awake, alert, oriented to person and month. Moderately dysarthric speech. Poor concentration.  Motor examination with generalized weakness, inability to raise any of the 4 extremities antigravity.  Cranial nerves II to XII appear intact grossly.  Sensation: Intact to light touch.  Coordination difficult to assess. Skin: Cap refill 2-3 seconds.    ED Results / Procedures / Treatments   Labs (all labs ordered are listed, but  only abnormal results are displayed) Labs Reviewed  CBC - Abnormal; Notable for the following components:      Result Value   MCV 104.6 (*)    All other components within normal limits  DIFFERENTIAL - Abnormal; Notable for the following components:   Eosinophils Absolute 0.8 (*)    All other components within normal limits  COMPREHENSIVE METABOLIC PANEL - Abnormal; Notable for the following components:   CO2 13 (*)    Glucose, Bld 215 (*)    Creatinine, Ser 1.60 (*)    Total Protein 6.3 (*)    Albumin 3.3 (*)    GFR, Estimated 33 (*)    Anion gap 22 (*)    All other components within normal limits  LACTIC ACID, PLASMA - Abnormal; Notable for the following components:   Lactic Acid, Venous 4.1 (*)    All other components within normal limits  LACTIC ACID, PLASMA - Abnormal; Notable for the following components:   Lactic Acid, Venous 2.0 (*)    All other components within normal limits  I-STAT CHEM 8, ED - Abnormal; Notable for the following components:   Creatinine, Ser 1.30 (*)    Glucose, Bld 211 (*)    Calcium, Ion 1.11 (*)    TCO2 14 (*)    All other components within normal limits  CBG MONITORING, ED - Abnormal; Notable for the following components:   Glucose-Capillary 217 (*)    All other components within normal limits  CULTURE, BLOOD (ROUTINE X 2)  CULTURE, BLOOD (ROUTINE X 2)  URINE CULTURE  ETHANOL  PROTIME-INR  APTT  VITAMIN B12  TSH  AMMONIA  RAPID URINE DRUG SCREEN, HOSP PERFORMED  URINALYSIS, ROUTINE W REFLEX MICROSCOPIC  VITAMIN B1  LACTIC ACID, PLASMA    EKG None  Radiology CT ANGIO HEAD NECK W WO CM W PERF (CODE STROKE)  Result Date: 01/01/2022 CLINICAL DATA:  Initial evaluation for neuro deficit, stroke suspected. EXAM: CT ANGIOGRAPHY HEAD AND NECK CT PERFUSION BRAIN TECHNIQUE: Multidetector CT imaging of the head and neck was performed using the standard protocol during bolus administration of intravenous contrast. Multiplanar CT image  reconstructions and MIPs were obtained to evaluate the vascular anatomy. Carotid stenosis measurements (when applicable) are obtained utilizing NASCET criteria, using the distal internal carotid diameter as the denominator. Multiphase CT imaging of the brain was performed following IV bolus contrast injection. Subsequent parametric perfusion maps were calculated using RAPID software. RADIATION DOSE REDUCTION: This exam was performed according to the departmental dose-optimization program which includes automated exposure control, adjustment of the mA and/or kV according to patient size and/or use of iterative reconstruction technique. CONTRAST:  46mL OMNIPAQUE IOHEXOL 350 MG/ML SOLN COMPARISON:  Head CT from earlier the same day. FINDINGS: CTA NECK FINDINGS Aortic arch: Examination moderately to severely degraded by motion artifact. Aortic arch within normal limits for caliber. Moderate to advanced atheromatous change  about the arch and origin of the great vessels. 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 Right carotid system: Right common and internal carotid arteries are tortuous without dissection or occlusion. Atheromatous change about the right carotid bulb without hemodynamically significant greater than 50% stenosis. Left carotid system: Left common and internal carotid arteries are tortuous without dissection or occlusion. Atheromatous change about the left carotid bulb without hemodynamically significant greater than 50% stenosis. Vertebral arteries: Both vertebral arteries arise from subclavian arteries. No other proximal subclavian artery stenosis. Vertebral arteries are tortuous without visible dissection, stenosis or occlusion. Skeleton: No visible discrete or worrisome osseous lesions. Exaggeration of the normal cervical lordosis noted. Other neck: No other visible soft tissue abnormality within the neck. Upper chest:  Visualized upper chest demonstrates no acute finding. Review of the MIP images confirms the above findings CTA HEAD FINDINGS Anterior circulation: Evaluation of the intracranial circulation limited by motion artifact. /petrous segments patent bilaterally. Atheromatous change within the carotid siphons without definite high-grade stenosis. A1 segments patent. Grossly normal anterior communicating artery complex. Both anterior cerebral arteries grossly patent to their distal aspects without visible stenosis. M1 segments patent bilaterally. No visible proximal MCA branch occlusion. Distal MCA branches grossly perfused and symmetric. Posterior circulation: Both V4 segments patent without visible stenosis. Both PICA patent. Basilar grossly patent to its distal aspect without stenosis. Superior cerebral arteries grossly patent bilaterally. Both PCAs grossly patent to their distal aspects without visible stenosis or occlusion. Probable fetal type origin of the left PCA. Venous sinuses: Patent allowing for timing the contrast bolus and motion. Anatomic variants: As above. Review of the MIP images confirms the above findings CT Brain Perfusion Findings: ASPECTS: 10 CBF (<30%) Volume: 51mL Perfusion (Tmax>6.0s) volume: 49mL Mismatch Volume: 61mL Infarction Location:Negative CT perfusion for acute core infarct. 30 cc delayed perfusion within the right cerebral hemisphere, watershed in distribution. 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 IMPRESSION: CTA HEAD AND NECK IMPRESSION: 1. Motion degraded exam. 2. Negative CTA, with no visible acute large vessel occlusion. 3. 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 4. Mild-to-moderate atheromatous change elsewhere about the major arterial vasculature of the head and neck. No other visible proximal high-grade or correctable stenosis. 5. Diffuse tortuosity of the major arterial vasculature of the  head and neck, suggesting chronic underlying hypertension. CT PERFUSION IMPRESSION: 1. No evidence for acute core infarct. 2. 30 mL area of delayed perfusion within the right cerebral hemisphere, watershed in distribution. 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 Case discussed by telephone at the time of interpretation on 01/01/2022 at 7:25 p.m. with provider ASHISH ARORA. Electronically Signed   By: Jeannine Boga M.D.   On: 01/01/2022 20:21   DG Chest Port 1 View  Result Date: 01/01/2022 CLINICAL DATA:  Weakness EXAM: PORTABLE CHEST 1 VIEW COMPARISON:  10/15/2021 FINDINGS: Lungs are clear. No pneumothorax or pleural effusion. 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 Cardiac size is within normal limits. Pulmonary vascularity is normal. Right total shoulder arthroplasty has been performed. No acute bone abnormality. IMPRESSION: 1. No active disease. 2. 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 This could be better assessed with a standard two view chest radiograph or CT  imaging if indicated. Electronically Signed   By: Helyn Numbers M.D.   On: 01/01/2022 19:53   CT HEAD CODE STROKE WO CONTRAST  Result Date: 01/01/2022 CLINICAL DATA:  Code stroke. Initial evaluation for neuro deficit, headache with blurry vision. EXAM: CT HEAD WITHOUT CONTRAST TECHNIQUE: Contiguous axial images were obtained from the base of the skull through the vertex without intravenous contrast. RADIATION DOSE REDUCTION: This exam was performed according to the departmental dose-optimization program which includes automated exposure control, adjustment of the mA and/or kV according to patient size and/or use of iterative reconstruction technique. COMPARISON:  None available. FINDINGS: Brain: Examination  moderately degraded by motion artifact. No acute intracranial hemorrhage. No visible acute large vessel territory infarct. No mass lesion, midline shift or mass effect. No hydrocephalus. No visible extra-axial fluid collection. Vascular: No visible abnormal hyperdense vessel. Skull: Scalp soft tissues and calvarium within normal limits. Sinuses/Orbits: Globes orbital soft tissues demonstrate no acute finding. Mild left sphenoid sinus disease. Paranasal sinuses are otherwise clear. Other: No mastoid effusion. ASPECTS [facility] Stroke Program Early CT Score) - Ganglionic level infarction (caudate, lentiform nuclei, internal capsule, insula, M1-M3 cortex): 7 - Supraganglionic infarction (M4-M6 cortex): 3 Total score (0-10 with 10 being normal): 10 IMPRESSION: 1. Motion degraded exam. No definite acute intracranial abnormality. 2. ASPECTS is 10. These results were communicated to Dr. Wilford Corner at 7:18 pm on 01/01/2022 by text page via the [facility] messaging system. Electronically Signed   By: Rise Mu M.D.   On: 01/01/2022 19:21    Procedures Procedures    Medications Ordered in ED Medications  0.9 %  sodium chloride infusion (1,000 mLs Intravenous New Bag/Given 01/01/22 1929)  lactated ringers infusion ( Intravenous New Bag/Given 01/01/22 2101)  vancomycin (VANCOREADY) IVPB 750 mg/150 mL (0 mg Intravenous Stopped 01/01/22 2237)    Followed by  vancomycin (VANCOREADY) IVPB 500 mg/100 mL (has no administration in time range)  ceFEPIme (MAXIPIME) 2 g in sodium chloride 0.9 % 100 mL IVPB (0 g Intravenous Stopped 01/01/22 2126)  LORazepam (ATIVAN) injection 1 mg (1 mg Intravenous Given 01/01/22 1902)  levETIRAcetam (KEPPRA) IVPB 1500 mg/ 100 mL premix (0 mg Intravenous Stopped 01/01/22 2024)  iohexol (OMNIPAQUE) 350 MG/ML injection 75 mL (75 mLs Intravenous Contrast Given 01/01/22 1922)  lactated ringers bolus 500 mL (0 mLs Intravenous Stopped 01/01/22 2121)  metroNIDAZOLE (FLAGYL) IVPB 500 mg (0 mg Intravenous  Stopped 01/01/22 2217)  sodium bicarbonate injection 150 mEq (150 mEq Intravenous Given 01/01/22 2249)    ED Course/ Medical Decision Making/ A&P                           Medical Decision Making Problems Addressed: Hypotension due to hypovolemia: acute illness or injury that poses a threat to life or bodily functions Sepsis, due to unspecified organism, unspecified whether acute organ dysfunction present [facility]): acute illness or injury that poses a threat to life or bodily functions  Amount and/or Complexity of Data Reviewed Independent Historian: EMS External Data Reviewed: notes. Labs: ordered. Decision-making details documented in ED Course. Radiology: ordered and independent interpretation performed. Decision-making details documented in ED Course. Discussion of management or test interpretation with external provider(s): Neurology   Risk Prescription drug management. Decision regarding hospitalization.   DG Chest Port 1 View  Final Result    CT ANGIO HEAD NECK W WO CM W PERF (CODE STROKE)  Final Result    CT HEAD CODE STROKE WO CONTRAST  Final Result  Wanda Valdez is a 76 y.o. female with PMHX of hypertension, AVM of colon, bilateral carotid artery stenosis, iron deficiency anemia who presents with headache, blurry vision, and left sided reported weakness with jerky movements on exam, concerning for a stroke. CODE STROKE was initiated on this patient.   The patient was quickly assessed by myself and the attending. The patient was maintaining their own airway. The stroke team was emergently consulted and present at bedside quickly after arrival. LKW 12 pm today. A through neurological exam was performed, and pertinent findings include left arm jerking movements, NIHSS 4, bilateral leg weakness. Patient ordered for 1 mg IV Ativan for jerking movements concerning for possible focal seizure. Patient additionally given Keppra load of 1.5 g.  CT scanner was made  available and the patient was rapidly transported. Head CT full report in chart, but in summary revealed:   - CTH: Moderately degraded by motion artifact.  No acute intracranial hemorrhage.  No visible acute LVO.  No mass lesion, midline shift, or mass effect.  No hydrocephalus.  No visible extra-axial fluid collection. - CTA/CTP: Motion degraded exam.  Negative CTA, with no visible acute large vessel occlusion.  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  No evidence of acute core infarct.  30 mL area of delayed perfusion within the right cerebral hemisphere, watershed in distribution.  Imaging reviewed by myself and considered in medical decision making.  Imaging interpreted by radiology and additionally by me, Neurology team.   Other causes of the neurological findings were considered, and include infectious causes (encephalopathy, meningitis), tumor/abscess, toxic causes (hypoglycemia, renal failure, hepatic failure, toxic ingestion), other neurologic disorders (seizure, Todd paralysis, GBS, status epilepticus, BPPV, disc herniation, diabetic neuropathy), psychiatric causes, migraine,. Will perform further workup to assess for metabolic/infectious causes.   After CT scans, patient subsequently became hypotensive with blood pressure 60s/50s with maps in the 60s.  Patient initiated on 30 cc/KG IV fluid bolus.  Rectal T 100.3.  Sepsis protocol initiated at 7:52 pm, patient ordered for blood cultures and broad-spectrum antibiotics with cefepime, Flagyl, and vancomycin.  Patient is sleepy now after Ativan administration however does awaken easily to verbal stimulation, no seizure-like activity noted at this time.  After IV fluids, MAP did improved but still on the lower side of normal, patient ultimately initiated on Levophed at low-dose for persistent hypotension.  Patient with watershed region infarct, consistent with her hypotension.  Neurology recommended EEG, which  they are attempting to coordinate from the ED. they feel that though jerking movements may have been consistent with seizure and they will further evaluate for such, this could also be secondary to uremia in the setting of AKI.  They felt that overall her symptoms are most likely secondary to hypovolemia in the setting of poor p.o. intake and severe stenosis of the innominate artery.  Workup: Labs notable for Cr 1.60, up from baseline around 0.8, representing AKI.  Glucose 215, no hypoglycemia.  Bicarbonate low at 13 and anion gap elevated at 22, suspect secondary to either sepsis versus seizure.  Electrolytes otherwise within normal limits.  CBC overall unremarkable, no leukocytosis, hemoglobin within normal limits at 13.2.  Ethanol level negative.  Lactic acid elevated at 4.1, consistent with sepsis versus seizure-like activity or combination of both.  Ammonia within normal limits at 33.  TSH within normal limits.  UA pending at time of admission.  Blood cultures drawn in ED.  Critical care consulted. They evaluated the patient in  the ED and turned off norepi drip due to improved BP. As patient has been stable off pressors for 1 hour, they felt admission to floor vs stepdown unit more appropriate than ICU. Patient admitted to Hospitalist for further management.   Patient was discussed with my attending, Dr. Fredderick PhenixBelfi, who provided oversight and voiced agreement with this assessment and plan.          Final Clinical Impression(s) / ED Diagnoses Final diagnoses:  Hypotension due to hypovolemia  Sepsis, due to unspecified organism, unspecified whether acute organ dysfunction present [facility](HCC)    Rx / DC Orders ED Discharge Orders     None         Stephanie CoupGelmann, Raven Harmes, MD 01/02/22 [place]0023    Rolan BuccoBelfi, Melanie, MD 01/02/22 1504

## 2022-01-01 NOTE — Consult Note (Signed)
NAME:  Wanda Valdez, MRN:  [id number], DOB:  07/30/1945, LOS: 0 ADMISSION DATE:  01/01/2022, CONSULTATION DATE:  11/2 REFERRING MD:  Dr. Fredderick Phenix, CHIEF COMPLAINT:  Hypotension   History of Present Illness:  76 year old female with PMH as below, which is significant for HTN, CKD 3a, PVD, GERD, and lymphocytic colitis. She has been admitted previously for dehydration secondary to excessive diarrhea, which has improved with treatment of colitis. Recently underwent rotator cuff repair of the right shoulder, but has not been participating in PT per patient report. She also reports treatment for UTI in August of this year, which did resolve, but symptoms recurred in October and she has not sought treatment. Complains of dysuria for the past month and L flank pain for that last 24 hours.   She presented to [facility] ED from home 11/2. Poor historian, some history obtained via medical record. At around noon she began to complain of headache and blurred vision. Took tylenol for subjective fever. Then at 1815 she developed seizure like activity of the L arm and L leg. She did not lose consciousness during this episode. She arrived to the ED as a code stroke. She was also hypotensive. CT head was unremarkable. CT perfusion showed 70mL area of delayed perfusion in the right cerebral hemisphere with a watershed distribution. 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 She was started on norepinephrine infusion for BP support as SBPs had dipped into the 60s despite fluid resuscitation. PCCM has been asked to evaluate for admission.   Pertinent  Medical History   has a past medical history of Hypertension.   Significant Hospital Events: Including procedures, antibiotic start and stop dates in addition to other pertinent events   11/2 admit  Interim History / Subjective:    Objective   Blood  pressure (!) 119/94, pulse 83, temperature 100.3 F (37.9 C), temperature source Rectal, resp. rate 18, height 4\' 10"  (1.473 m), weight 41.6 kg, SpO2 94 %.        Intake/Output Summary (Last 24 hours) at 01/01/2022 2252 Last data filed at 01/01/2022 2115 Gross per 24 hour  Intake 9.81 ml  Output --  Net 9.81 ml   Filed Weights   01/01/22 1903  Weight: 41.6 kg    Examination: General: Frail eldelry appearing female in NAD HENT: [place]/AT, PERRL, no JVD. Mucous membranes quite dry.  Lungs: Clear bilateral breath sounds Cardiovascular: RRR, no MRG Abdomen: L flank tenderness.  Extremities: Right upper extremity contracted with ROM significantly limited by shoulder pain.  Neuro: Alert, oriented, non-focal GU: extremal catheter draining clear yellow urine  Resolved Hospital Problem list     Assessment & Plan:   Hypotension: hypovolemic vs distributive. Patient reports decreased PO intake the last few days. Does have some complaints consistent with acute cystitis vs pyelonephritis as well. Briefly norepinephrine. I turned this off in ED and she has been maintaining per MAPs well over 65. Still appears dry on exam.  - Continued IVF resuscitation - Pressors fortunately able to be discontinued - Empiric antibiotics.  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  Seizure activity - neurology following - EEG pending - Keppra - PRN ativan  Elevated anion gap acidosis: lactic acid 4.1 on presentation. - repeat lactic acid pending.  - s/p sodium bicarb bolus in ED - Trend chemistry  Barrett's  esophagus  - per primary  Shoulder pain: due to rotator cuff repair. Seems like she has not done PT.  - Will need PT when appropriate.   Remainder per admitting provider.   Best Practice (right click and "Reselect all SmartList Selections" daily)   Per primary  Labs   CBC: Recent Labs  Lab 01/01/22 1900  01/01/22 1905  WBC 8.0  --   NEUTROABS 3.0  --   HGB 13.2 13.9  HCT 40.6 41.0  MCV 104.6*  --   PLT 262  --     Basic Metabolic Panel: Recent Labs  Lab 01/01/22 1900 01/01/22 1905  NA 142 139  K 5.0 4.8  CL 107 110  CO2 13*  --   GLUCOSE 215* 211*  BUN 13 10  CREATININE 1.60* 1.30*  CALCIUM 9.9  --    GFR: Estimated Creatinine Clearance: 23 mL/min (A) (by C-G formula based on SCr of 1.3 mg/dL (H)). Recent Labs  Lab 01/01/22 1900 01/01/22 1950  WBC 8.0  --   LATICACIDVEN  --  4.1*    Liver Function Tests: Recent Labs  Lab 01/01/22 1900  AST 31  ALT 15  ALKPHOS 87  BILITOT 0.7  PROT 6.3*  ALBUMIN 3.3*   No results for input(s): "LIPASE", "AMYLASE" in the last 168 hours. Recent Labs  Lab 01/01/22 1950  AMMONIA 33    ABG    Component Value Date/Time   TCO2 14 (L) 01/01/2022 1905     Coagulation Profile: Recent Labs  Lab 01/01/22 1900  INR 1.0    Cardiac Enzymes: No results for input(s): "CKTOTAL", "CKMB", "CKMBINDEX", "TROPONINI" in the last 168 hours.  HbA1C: No results found for: "HGBA1C"  CBG: Recent Labs  Lab 01/01/22 1859  GLUCAP 217*    Review of Systems:   Bolds are positive  Constitutional: weight loss, gain, night sweats, Fevers, chills, fatigue .  HEENT: headaches, Sore throat, sneezing, nasal congestion, post nasal drip, Difficulty swallowing, Tooth/dental problems, visual complaints visual changes, ear ache CV:  chest pain, radiates:,Orthopnea, PND, swelling in lower extremities, dizziness, palpitations, syncope.  GI  heartburn, indigestion, abdominal pain, nausea, vomiting, diarrhea, change in bowel habits, loss of appetite, bloody stools.  Resp: cough, productive: , hemoptysis, dyspnea, chest pain, pleuritic.  Skin: rash or itching or icterus GU: dysuria, malodorous urine, change in color of urine, urgency or frequency. flank pain, hematuria  MS: joint pain or swelling. decreased range of motion  Psych: change in mood  or affect. depression or anxiety.  Neuro: difficulty with speech, weakness, numbness, ataxia    Past Medical History:  She,  has a past medical history of Hypertension.   Surgical History:   Past Surgical History:  Procedure Laterality Date   ABDOMINAL HYSTERECTOMY     BIOPSY  09/11/2020   Procedure: BIOPSY;  Surgeon: [name], MD;  Location: [facility] ENDOSCOPY;  Service: Gastroenterology;;   BIOPSY  09/13/2020   Procedure: BIOPSY;  Surgeon: [name], MD;  Location: [facility];  Service: Gastroenterology;;   BIOPSY  01/29/2021   Procedure: BIOPSY;  Surgeon: [name], MD;  Location: [facility];  Service: Endoscopy;;   COLONOSCOPY N/A 09/13/2020   Procedure: COLONOSCOPY;  Surgeon: [name], MD;  Location: [facility];  Service: Gastroenterology;  Laterality: N/A;   ESOPHAGOGASTRODUODENOSCOPY N/A 09/11/2020   Procedure: ESOPHAGOGASTRODUODENOSCOPY (EGD);  Surgeon: [name], MD;  Location: [facility];  Service: Gastroenterology;  Laterality: N/A;   ESOPHAGOGASTRODUODENOSCOPY (EGD) WITH PROPOFOL N/A 01/29/2021   Procedure: ESOPHAGOGASTRODUODENOSCOPY (EGD)  WITH PROPOFOL;  Surgeon: Vida Rigger, MD;  Location: [facility] ENDOSCOPY;  Service: Endoscopy;  Laterality: N/A;  With dilation as well and will need ultraslim endoscope in the room   POLYPECTOMY  09/13/2020   Procedure: POLYPECTOMY;  Surgeon: Kathi Der, MD;  Location: [facility];  Service: Gastroenterology;;   Gaspar Bidding DILATION N/A 01/29/2021   Procedure: Gaspar Bidding DILATION;  Surgeon: Vida Rigger, MD;  Location: [facility] ENDOSCOPY;  Service: Endoscopy;  Laterality: N/A;     Social History:   reports that she has been smoking cigarettes. She has never used smokeless tobacco. She reports current alcohol use. She reports that she does not use drugs.   Family History:  Her family history includes Cirrhosis in her mother; Heart attack in her father.   Allergies No Known Allergies   Home Medications  Prior to  Admission medications   Medication Sig Start Date End Date Taking? Authorizing Provider  alendronate (FOSAMAX) 35 MG tablet Take 35 mg by mouth once a week. 03/28/20   [provider]  allopurinol (ZYLOPRIM) 100 MG tablet Take 100 mg by mouth daily. 08/10/20   [provider]  atorvastatin (LIPITOR) 20 MG tablet Take 20 mg by mouth daily. 10/01/20   [provider]  cholestyramine (QUESTRAN) 4 g packet Take 1 packet by mouth 2 (two) times daily. 10/10/20   [provider]  cholestyramine light (PREVALITE) 4 g packet Take 1 packet (4 g total) by mouth 2 (two) times daily. 09/22/20   Cipriano Bunker, MD  CREON 6000-19000 units CPEP Take 2 capsules by mouth 3 (three) times daily. 05/11/20   [provider]  diphenoxylate-atropine (LOMOTIL) 2.5-0.025 MG tablet Take 1 tablet by mouth 2 (two) times daily as needed. 12/16/20   [provider]  EPINEPHrine 0.3 mg/0.3 mL IJ SOAJ injection SMARTSIG:0.3 Milliliter(s) IM Once PRN 10/01/20   [provider]  famotidine (PEPCID) 40 MG tablet Take 40 mg by mouth daily. 10/01/20   [provider]  loperamide (IMODIUM) 2 MG capsule Take 4 mg by mouth daily as needed for diarrhea or loose stools.    [provider]  metoprolol tartrate (LOPRESSOR) 25 MG tablet Take 25 mg by mouth daily. 01/01/21   [provider]  Multiple Vitamin (MULTIVITAMIN WITH MINERALS) TABS tablet Take 1 tablet by mouth daily. 09/14/20   Pahwani, Kasandra Knudsen, MD  naproxen sodium (ALEVE) 220 MG tablet Take 220 mg by mouth daily as needed.    [provider]  pantoprazole (PROTONIX) 40 MG tablet Take 1 tablet (40 mg total) by mouth daily. 09/14/20   Pahwani, Kasandra Knudsen, MD  predniSONE (DELTASONE) 10 MG tablet Take 1 tablet (10 mg total) by mouth daily with breakfast. 01/31/21   Ghimire, Werner Lean, MD  sucralfate (CARAFATE) 1 g tablet Take 1 g by mouth 3 (three) times daily. Dissolve 1 tablet in 2 ounces of water 05/27/20    [provider]     Critical care time:      Joneen Roach, AGACNP-BC [facility]  See Amion for personal pager PCCM on call pager [phone] until 7pm. Please call Elink 7p-7a. [phone]  01/01/2022 11:21 PM

## 2022-01-01 NOTE — ED Triage Notes (Signed)
Per [facility] pt coming from home as code stroke with last known normal of noon. Patient states she woke up not feeling well thought she had a fever along with dizziness and blurred vision. Patient took tylenol but unsure time. Patient c/o shortness of breath and left sided weakness. EMS report seizure like activity however A&0x4 throughout transport.

## 2022-01-01 NOTE — Progress Notes (Addendum)
Pharmacy Antibiotic Note  Wanda Valdez is a 76 y.o. female admitted on 01/01/2022 with sepsis.  Pharmacy has been consulted for vancomycin and cefepime dosing.  Patient presented initially as a code stroke with headache and blurry vision and was experiencing seizure-like activity. CT head showed no hemorrhage or infarct. After CT imaging, patient became hypotensive with blood pressure in 60s/50s. Patient is currently afebrile with no leukocytosis. Team would like to start broad spectrum antibiotics for sepsis. One dose of metronidazole given in ED.  Plan: Give vancomycin 750mg  IV x1 Start vancomycin 500 mg IV q48 hours Start cefepime 2g IV q 24 hours If continuing, recommend giving metronidazole 500mg  IV q12 hours Monitor clinical status, renal function, culture data, and LOT  Height: 4\' 10"  (147.3 cm) Weight: 41.6 kg (91 lb 11.4 oz) IBW/kg (Calculated) : 40.9  Temp (24hrs), Avg:99.8 F (37.7 C), Min:99.3 F (37.4 C), Max:100.3 F (37.9 C)  Recent Labs  Lab 01/01/22 1900 01/01/22 1905  WBC 8.0  --   CREATININE 1.60* 1.30*    Estimated Creatinine Clearance: 23 mL/min (A) (by C-G formula based on SCr of 1.3 mg/dL (H)).    No Known Allergies  Antimicrobials this admission: vancomycin 11/2 >>  cefepime 11/2 >>  Metronidazole 11/2 >>  Dose adjustments this admission:   Microbiology results: 11/2 BCx:   Thank you for allowing pharmacy to be a part of this patient's care.  Louanne Belton, PharmD, [facility] PGY1 Pharmacy Resident 01/01/2022 8:44 PM

## 2022-01-02 ENCOUNTER — Inpatient Hospital Stay (HOSPITAL_COMMUNITY): Payer: Medicare Other

## 2022-01-02 ENCOUNTER — Encounter (HOSPITAL_COMMUNITY): Payer: Self-pay | Admitting: Internal Medicine

## 2022-01-02 DIAGNOSIS — R569 Unspecified convulsions: Secondary | ICD-10-CM

## 2022-01-02 DIAGNOSIS — I6389 Other cerebral infarction: Secondary | ICD-10-CM | POA: Diagnosis not present

## 2022-01-02 DIAGNOSIS — A419 Sepsis, unspecified organism: Secondary | ICD-10-CM | POA: Diagnosis not present

## 2022-01-02 DIAGNOSIS — N3000 Acute cystitis without hematuria: Secondary | ICD-10-CM

## 2022-01-02 DIAGNOSIS — E861 Hypovolemia: Secondary | ICD-10-CM

## 2022-01-02 DIAGNOSIS — I9589 Other hypotension: Secondary | ICD-10-CM | POA: Diagnosis not present

## 2022-01-02 DIAGNOSIS — N179 Acute kidney failure, unspecified: Secondary | ICD-10-CM | POA: Diagnosis not present

## 2022-01-02 DIAGNOSIS — R579 Shock, unspecified: Secondary | ICD-10-CM | POA: Diagnosis not present

## 2022-01-02 DIAGNOSIS — E8729 Other acidosis: Secondary | ICD-10-CM

## 2022-01-02 LAB — RAPID URINE DRUG SCREEN, HOSP PERFORMED
Amphetamines: NOT DETECTED
Barbiturates: NOT DETECTED
Benzodiazepines: NOT DETECTED
Cocaine: NOT DETECTED
Opiates: NOT DETECTED
Tetrahydrocannabinol: NOT DETECTED

## 2022-01-02 LAB — CBC
HCT: 33.9 % — ABNORMAL LOW (ref 36.0–46.0)
Hemoglobin: 11.2 g/dL — ABNORMAL LOW (ref 12.0–15.0)
MCH: 33.8 pg (ref 26.0–34.0)
MCHC: 33 g/dL (ref 30.0–36.0)
MCV: 102.4 fL — ABNORMAL HIGH (ref 80.0–100.0)
Platelets: 83 10*3/uL — ABNORMAL LOW (ref 150–400)
RBC: 3.31 MIL/uL — ABNORMAL LOW (ref 3.87–5.11)
RDW: 14.5 % (ref 11.5–15.5)
WBC: 4.7 10*3/uL (ref 4.0–10.5)
nRBC: 0 % (ref 0.0–0.2)

## 2022-01-02 LAB — LACTATE DEHYDROGENASE: LDH: 154 U/L (ref 98–192)

## 2022-01-02 LAB — ECHOCARDIOGRAM COMPLETE
AV Mean grad: 3 mmHg
AV Peak grad: 5.1 mmHg
Ao pk vel: 1.13 m/s
Area-P 1/2: 3.03 cm2
Height: 58 in
S' Lateral: 2.6 cm
Weight: 1467.38 oz

## 2022-01-02 LAB — HEMOGLOBIN A1C
Hgb A1c MFr Bld: 4.8 % (ref 4.8–5.6)
Mean Plasma Glucose: 91.06 mg/dL

## 2022-01-02 LAB — IRON AND TIBC
Iron: 43 ug/dL (ref 28–170)
Saturation Ratios: 19 % (ref 10.4–31.8)
TIBC: 227 ug/dL — ABNORMAL LOW (ref 250–450)
UIBC: 184 ug/dL

## 2022-01-02 LAB — RETICULOCYTES
Immature Retic Fract: 13.5 % (ref 2.3–15.9)
RBC.: 3.23 MIL/uL — ABNORMAL LOW (ref 3.87–5.11)
Retic Count, Absolute: 56.8 10*3/uL (ref 19.0–186.0)
Retic Ct Pct: 1.8 % (ref 0.4–3.1)

## 2022-01-02 LAB — CREATININE, SERUM
Creatinine, Ser: 1.16 mg/dL — ABNORMAL HIGH (ref 0.44–1.00)
GFR, Estimated: 48 mL/min — ABNORMAL LOW (ref >60)

## 2022-01-02 LAB — URINALYSIS, ROUTINE W REFLEX MICROSCOPIC
Bilirubin Urine: NEGATIVE
Glucose, UA: NEGATIVE mg/dL
Hgb urine dipstick: NEGATIVE
Ketones, ur: 5 mg/dL — AB
Leukocytes,Ua: NEGATIVE
Nitrite: NEGATIVE
Protein, ur: NEGATIVE mg/dL
Specific Gravity, Urine: 1.017 (ref 1.005–1.030)
pH: 8 (ref 5.0–8.0)

## 2022-01-02 LAB — GLUCOSE, CAPILLARY: Glucose-Capillary: 80 mg/dL (ref 70–99)

## 2022-01-02 LAB — FOLATE: Folate: >40 ng/mL (ref >5.9)

## 2022-01-02 LAB — FERRITIN: Ferritin: 55 ng/mL (ref 11–307)

## 2022-01-02 LAB — TECHNOLOGIST SMEAR REVIEW

## 2022-01-02 LAB — LIPID PANEL
Cholesterol: 167 mg/dL (ref 0–200)
HDL: 82 mg/dL (ref >40)
LDL Cholesterol: 76 mg/dL (ref 0–99)
Total CHOL/HDL Ratio: 2 RATIO
Triglycerides: 45 mg/dL (ref <150)
VLDL: 9 mg/dL (ref 0–40)

## 2022-01-02 LAB — LACTIC ACID, PLASMA: Lactic Acid, Venous: 1.6 mmol/L (ref 0.5–1.9)

## 2022-01-02 MED ORDER — LACTATED RINGERS IV SOLN: INTRAVENOUS | Status: AC

## 2022-01-02 MED ORDER — INSULIN ASPART 100 UNIT/ML IJ SOLN: 0.0000 [IU] | Freq: Three times a day (TID) | INTRAMUSCULAR | Status: DC

## 2022-01-02 MED ORDER — ENOXAPARIN SODIUM 30 MG/0.3ML IJ SOSY
30.0000 mg | PREFILLED_SYRINGE | INTRAMUSCULAR | Status: DC
  Administered 2022-01-02 – 2022-01-05 (×4): 30 mg via SUBCUTANEOUS
  Filled 2022-01-02 (×4): qty 0.3

## 2022-01-02 MED ORDER — INSULIN ASPART 100 UNIT/ML IJ SOLN: 0.0000 [IU] | Freq: Every day | INTRAMUSCULAR | Status: DC

## 2022-01-02 MED ORDER — ACETAMINOPHEN 160 MG/5ML PO SOLN: 650.0000 mg | ORAL | Status: DC | PRN

## 2022-01-02 MED ORDER — PREDNISONE 5 MG PO TABS
10.0000 mg | ORAL_TABLET | Freq: Every day | ORAL | Status: DC
  Administered 2022-01-02 – 2022-01-09 (×8): 10 mg via ORAL
  Filled 2022-01-02 (×2): qty 1
  Filled 2022-01-02: qty 2
  Filled 2022-01-02: qty 1
  Filled 2022-01-02 (×3): qty 2
  Filled 2022-01-02: qty 1

## 2022-01-02 MED ORDER — ACETAMINOPHEN 325 MG PO TABS
650.0000 mg | ORAL_TABLET | ORAL | Status: DC | PRN
  Administered 2022-01-02 – 2022-01-07 (×4): 650 mg via ORAL
  Filled 2022-01-02 (×5): qty 2

## 2022-01-02 MED ORDER — STROKE: EARLY STAGES OF RECOVERY BOOK
Freq: Once | Status: AC
  Filled 2022-01-02: qty 1

## 2022-01-02 MED ORDER — HYDROCORTISONE SOD SUC (PF) 100 MG IJ SOLR
50.0000 mg | Freq: Once | INTRAMUSCULAR | Status: AC
  Administered 2022-01-02: 50 mg via INTRAVENOUS
  Filled 2022-01-02: qty 2

## 2022-01-02 MED ORDER — ACETAMINOPHEN 650 MG RE SUPP: 650.0000 mg | RECTAL | Status: DC | PRN

## 2022-01-02 NOTE — Procedures (Signed)
Patient Name: Wanda Valdez  MRN: [id number]  Epilepsy Attending: Lora Havens  Referring Physician/Provider: Amie Portland, MD  Date: 01/01/2022 Duration: 23.14 mins  Patient history: 76 year old female with jerking episodes.  EEG to evaluate for seizure.  Level of alertness: Awake  AEDs during EEG study: Ativan, LEV  Technical aspects: This EEG study was done with scalp electrodes positioned according to the 10-20 International system of electrode placement. Electrical activity was reviewed with band pass filter of 1-70Hz , sensitivity of 7 uV/mm, display speed of 20mm/sec with a 60Hz  notched filter applied as appropriate. EEG data were recorded continuously and digitally stored.  Video monitoring was available and reviewed as appropriate.  Description: The posterior dominant rhythm consists of 7.5 Hz activity of moderate voltage (25-35 uV) seen predominantly in posterior head regions, symmetric and reactive to eye opening and eye closing.  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  Additionally there is cortical dysfunction in right hemisphere likely secondary to underlying structural abnormality, postictal state.  No seizures were seen during the study.  Wanda Valdez

## 2022-01-02 NOTE — H&P (Signed)
History and Physical    Wanda Valdez [id number] DOB: 02-15-1946 DOA: 01/01/2022  Patient coming from: Home.  Chief Complaint: Weakness.  HPI: Wanda Valdez is a 76 y.o. female with history of lymphocytic colitis, hypertension who had a recent right shoulder surgery about a month ago was found to be weak complaining of blurred vision and headache was brought to the ER.  Per patient's daughter who provided the history patient was doing fine 24 hours earlier but has been having some diarrhea and was unable to get up from the bed since yesterday morning.  Did not have any chest pain fever chills shortness of breath.  Patient daughter also was concerned about some jerking spells.  ED Course: Patient was brought in as a code stroke.  Patient on admission in the ER was found to be hypotensive and had to be briefly started on Levophed and was given fluid bolus for concerning features for sepsis was started on empiric antibiotics critical care was consulted.  Patient had CT head which wAS unremarkable but CT perfusion study shows hypoperfusion in the right cerebral hemisphere in the setting of a significantly stenosed right innominate artery.  Patient also had a EEG done.  Patient's lactic acid improved with fluids.  Patient's lab work also shows thrombocytopenia acute renal failure and hyperglycemia.  At the time of my exam patient is more alert awake has some difficulty moving the left lower extremity.  Review of Systems: As per HPI, rest all negative.   Past Medical History:  Diagnosis Date   Hypertension     Past Surgical History:  Procedure Laterality Date   ABDOMINAL HYSTERECTOMY     BIOPSY  09/11/2020   Procedure: BIOPSY;  Surgeon: [name], MD;  Location: [facility];  Service: Gastroenterology;;   BIOPSY  09/13/2020   Procedure: BIOPSY;  Surgeon: [name], MD;  Location: [facility];  Service: Gastroenterology;;   BIOPSY  01/29/2021   Procedure: BIOPSY;   Surgeon: [name], MD;  Location: [facility];  Service: Endoscopy;;   COLONOSCOPY N/A 09/13/2020   Procedure: COLONOSCOPY;  Surgeon: [name], MD;  Location: [facility];  Service: Gastroenterology;  Laterality: N/A;   ESOPHAGOGASTRODUODENOSCOPY N/A 09/11/2020   Procedure: ESOPHAGOGASTRODUODENOSCOPY (EGD);  Surgeon: [name], MD;  Location: [facility];  Service: Gastroenterology;  Laterality: N/A;   ESOPHAGOGASTRODUODENOSCOPY (EGD) WITH PROPOFOL N/A 01/29/2021   Procedure: ESOPHAGOGASTRODUODENOSCOPY (EGD) WITH PROPOFOL;  Surgeon: [name], MD;  Location: [facility];  Service: Endoscopy;  Laterality: N/A;  With dilation as well and will need ultraslim endoscope in the room   POLYPECTOMY  09/13/2020   Procedure: POLYPECTOMY;  Surgeon: [name], MD;  Location: [facility];  Service: Gastroenterology;;   [name] DILATION N/A 01/29/2021   Procedure: [name] DILATION;  Surgeon: [name], MD;  Location: [place];  Service: Endoscopy;  Laterality: N/A;     reports that she has been smoking cigarettes. She has never used smokeless tobacco. She reports current alcohol use. She reports that she does not use drugs.  No Known Allergies  Family History  Problem Relation Age of Onset   Cirrhosis Mother    Heart attack Father     Prior to Admission medications   Medication Sig Start Date End Date Taking? Authorizing Provider  alendronate (FOSAMAX) 35 MG tablet Take 35 mg by mouth once a week. 03/28/20   [provider]  allopurinol (ZYLOPRIM) 100 MG tablet Take 100 mg by mouth daily. 08/10/20   [provider]  atorvastatin (LIPITOR) 20 MG  tablet Take 20 mg by mouth daily. 10/01/20   [provider]  cholestyramine (QUESTRAN) 4 g packet Take 1 packet by mouth 2 (two) times daily. 10/10/20   [provider]  cholestyramine light (PREVALITE) 4 g packet Take 1 packet (4 g total) by mouth 2 (two) times daily. 09/22/20   Shawna Clamp,  MD  CREON 6000-19000 units CPEP Take 2 capsules by mouth 3 (three) times daily. 05/11/20   [provider]  diphenoxylate-atropine (LOMOTIL) 2.5-0.025 MG tablet Take 1 tablet by mouth 2 (two) times daily as needed. 12/16/20   [provider]  EPINEPHrine 0.3 mg/0.3 mL IJ SOAJ injection SMARTSIG:0.3 Milliliter(s) IM Once PRN 10/01/20   [provider]  famotidine (PEPCID) 40 MG tablet Take 40 mg by mouth daily. 10/01/20   [provider]  loperamide (IMODIUM) 2 MG capsule Take 4 mg by mouth daily as needed for diarrhea or loose stools.    [provider]  metoprolol tartrate (LOPRESSOR) 25 MG tablet Take 25 mg by mouth daily. 01/01/21   [provider]  Multiple Vitamin (MULTIVITAMIN WITH MINERALS) TABS tablet Take 1 tablet by mouth daily. 09/14/20   Pahwani, Michell Heinrich, MD  naproxen sodium (ALEVE) 220 MG tablet Take 220 mg by mouth daily as needed.    [provider]  pantoprazole (PROTONIX) 40 MG tablet Take 1 tablet (40 mg total) by mouth daily. 09/14/20   Pahwani, Michell Heinrich, MD  predniSONE (DELTASONE) 10 MG tablet Take 1 tablet (10 mg total) by mouth daily with breakfast. 01/31/21   Ghimire, Henreitta Leber, MD  sucralfate (CARAFATE) 1 g tablet Take 1 g by mouth 3 (three) times daily. Dissolve 1 tablet in 2 ounces of water 05/27/20   [provider]    Physical Exam: Constitutional: Moderately built and nourished. Vitals:   01/01/22 2315 01/01/22 2320 01/01/22 2325 01/02/22 0055  BP: 134/88 (!) 134/91 (!) 142/85   Pulse: 69 71 71   Resp: 18 17 16    Temp:    98.1 F (36.7 C)  TempSrc:    Oral  SpO2: 97% 96% 93%   Weight:      Height:       Eyes: Anicteric no pallor. ENMT: No discharge from the ears eyes nose and mouth. Neck: No mass felt.  No neck rigidity. Respiratory: No rhonchi or crepitations. Cardiovascular: S1-S2 heard. Abdomen: Diffuse tenderness no guarding or rigidity. Musculoskeletal: No edema. Skin: No  rash. Neurologic: Alert awake oriented has difficulty moving right upper extremity due to recent right shoulder surgery and also has weakness of the left lower extremity. Psychiatric: Alert awake oriented to name and place.   Labs on Admission: I have personally reviewed following labs and imaging studies  CBC: Recent Labs  Lab 01/01/22 1900 01/01/22 1905  WBC 8.0  --   NEUTROABS 3.0  --   HGB 13.2 13.9  HCT 40.6 41.0  MCV 104.6*  --   PLT 262  --    Basic Metabolic Panel: Recent Labs  Lab 01/01/22 1900 01/01/22 1905  NA 142 139  K 5.0 4.8  CL 107 110  CO2 13*  --   GLUCOSE 215* 211*  BUN 13 10  CREATININE 1.60* 1.30*  CALCIUM 9.9  --    GFR: Estimated Creatinine Clearance: 23 mL/min (A) (by C-G formula based on SCr of 1.3 mg/dL (H)). Liver Function Tests: Recent Labs  Lab 01/01/22 1900  AST 31  ALT 15  ALKPHOS 87  BILITOT 0.7  PROT 6.3*  ALBUMIN 3.3*   No results for input(s): "LIPASE", "AMYLASE" in the last 168 hours. Recent Labs  Lab 01/01/22 1950  AMMONIA 33   Coagulation Profile: Recent Labs  Lab 01/01/22 1900  INR 1.0   Cardiac Enzymes: No results for input(s): "CKTOTAL", "CKMB", "CKMBINDEX", "TROPONINI" in the last 168 hours. BNP (last 3 results) No results for input(s): "PROBNP" in the last 8760 hours. HbA1C: No results for input(s): "HGBA1C" in the last 72 hours. CBG: Recent Labs  Lab 01/01/22 1859  GLUCAP 217*   Lipid Profile: No results for input(s): "CHOL", "HDL", "LDLCALC", "TRIG", "CHOLHDL", "LDLDIRECT" in the last 72 hours. Thyroid Function Tests: Recent Labs    01/01/22 1950  TSH 1.418   Anemia Panel: Recent Labs    01/01/22 1950  VITAMINB12 316   Urine analysis:    Component Value Date/Time   COLORURINE STRAW (A) 01/02/2022 0120   APPEARANCEUR HAZY (A) 01/02/2022 0120   LABSPEC 1.017 01/02/2022 0120   PHURINE 8.0 01/02/2022 0120   GLUCOSEU NEGATIVE 01/02/2022 0120   HGBUR NEGATIVE 01/02/2022 0120    BILIRUBINUR NEGATIVE 01/02/2022 0120   KETONESUR 5 (A) 01/02/2022 0120   PROTEINUR NEGATIVE 01/02/2022 0120   NITRITE NEGATIVE 01/02/2022 0120   LEUKOCYTESUR NEGATIVE 01/02/2022 0120   Sepsis Labs: @LABRCNTIP (procalcitonin:4,lacticidven:4) ) Recent Results (from the past 240 hour(s))  Culture, blood (routine x 2)     Status: None (Preliminary result)   Collection Time: 01/01/22  7:50 PM   Specimen: BLOOD RIGHT WRIST  Result Value Ref Range Status   Specimen Description BLOOD RIGHT WRIST  Final   Special Requests   Final    BOTTLES DRAWN AEROBIC AND ANAEROBIC Blood Culture results may not be optimal due to an inadequate volume of blood received in culture bottles Performed at [facility], [place] [address] [place], [place] [address]    Culture PENDING  Incomplete   Report Status PENDING  Incomplete     Radiological Exams on Admission: CT ANGIO HEAD NECK W WO CM W PERF (CODE STROKE)  Result Date: 01/01/2022 CLINICAL DATA:  Initial evaluation for neuro deficit, stroke suspected. EXAM: CT ANGIOGRAPHY HEAD AND NECK CT PERFUSION BRAIN TECHNIQUE: Multidetector CT imaging of the head and neck was performed using the standard protocol during bolus administration of intravenous contrast. Multiplanar CT image reconstructions and MIPs were obtained to evaluate the vascular anatomy. Carotid stenosis measurements (when applicable) are obtained utilizing NASCET criteria, using the distal internal carotid diameter as the denominator. Multiphase CT imaging of the brain was performed following IV bolus contrast injection. Subsequent parametric perfusion maps were calculated using RAPID software. RADIATION DOSE REDUCTION: This exam was performed according to the departmental dose-optimization program which includes automated exposure control, adjustment of the mA and/or kV according to patient size and/or use of iterative reconstruction technique. CONTRAST:  11mL OMNIPAQUE IOHEXOL 350 MG/ML SOLN  COMPARISON:  Head CT from earlier the same day. FINDINGS: CTA NECK FINDINGS Aortic arch: Examination moderately to severely degraded by motion artifact. Aortic arch within normal limits for caliber. Moderate to advanced atheromatous change about the arch and origin of the great vessels. 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 Right carotid system: Right common and internal carotid arteries are tortuous without dissection or occlusion. Atheromatous change about the right carotid bulb without hemodynamically significant greater than 50% stenosis. Left carotid system: Left common and internal carotid arteries are tortuous without dissection or occlusion.  Atheromatous change about the left carotid bulb without hemodynamically significant greater than 50% stenosis. Vertebral arteries: Both vertebral arteries arise from subclavian arteries. No other proximal subclavian artery stenosis. Vertebral arteries are tortuous without visible dissection, stenosis or occlusion. Skeleton: No visible discrete or worrisome osseous lesions. Exaggeration of the normal cervical lordosis noted. Other neck: No other visible soft tissue abnormality within the neck. Upper chest: Visualized upper chest demonstrates no acute finding. Review of the MIP images confirms the above findings CTA HEAD FINDINGS Anterior circulation: Evaluation of the intracranial circulation limited by motion artifact. /petrous segments patent bilaterally. Atheromatous change within the carotid siphons without definite high-grade stenosis. A1 segments patent. Grossly normal anterior communicating artery complex. Both anterior cerebral arteries grossly patent to their distal aspects without visible stenosis. M1 segments patent bilaterally. No visible proximal MCA branch occlusion. Distal MCA branches grossly perfused and symmetric. Posterior circulation: Both V4 segments patent without  visible stenosis. Both PICA patent. Basilar grossly patent to its distal aspect without stenosis. Superior cerebral arteries grossly patent bilaterally. Both PCAs grossly patent to their distal aspects without visible stenosis or occlusion. Probable fetal type origin of the left PCA. Venous sinuses: Patent allowing for timing the contrast bolus and motion. Anatomic variants: As above. Review of the MIP images confirms the above findings CT Brain Perfusion Findings: ASPECTS: 10 CBF (<30%) Volume: 9mL Perfusion (Tmax>6.0s) volume: 69mL Mismatch Volume: 74mL Infarction Location:Negative CT perfusion for acute core infarct. 30 cc delayed perfusion within the right cerebral hemisphere, watershed in distribution. 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 IMPRESSION: CTA HEAD AND NECK IMPRESSION: 1. Motion degraded exam. 2. Negative CTA, with no visible acute large vessel occlusion. 3. 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 4. Mild-to-moderate atheromatous change elsewhere about the major arterial vasculature of the head and neck. No other visible proximal high-grade or correctable stenosis. 5. Diffuse tortuosity of the major arterial vasculature of the head and neck, suggesting chronic underlying hypertension. CT PERFUSION IMPRESSION: 1. No evidence for acute core infarct. 2. 30 mL area of delayed perfusion within the right cerebral hemisphere, watershed in distribution. 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 Case discussed by telephone at the time of interpretation on 01/01/2022 at 7:25 p.m. with provider ASHISH ARORA. Electronically Signed   By: Jeannine Boga M.D.   On: 01/01/2022 20:21   DG Chest Port 1 View  Result Date: 01/01/2022 CLINICAL DATA:  Weakness EXAM: PORTABLE CHEST 1 VIEW COMPARISON:  10/15/2021 FINDINGS: Lungs are clear. No pneumothorax or pleural  effusion. 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 Cardiac size is within normal limits. Pulmonary vascularity is normal. Right total shoulder arthroplasty has been performed. No acute bone abnormality. IMPRESSION: 1. No active disease. 2. 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 This could be better assessed with a standard two view chest radiograph or CT imaging if indicated. Electronically Signed   By: Fidela Salisbury M.D.   On: 01/01/2022 19:53   CT HEAD CODE STROKE WO CONTRAST  Result Date: 01/01/2022 CLINICAL DATA:  Code stroke. Initial evaluation for neuro deficit, headache with blurry vision. EXAM: CT HEAD WITHOUT CONTRAST TECHNIQUE: Contiguous axial images were obtained from the base of the skull through the vertex without intravenous contrast. RADIATION DOSE REDUCTION: This exam  was performed according to the departmental dose-optimization program which includes automated exposure control, adjustment of the mA and/or kV according to patient size and/or use of iterative reconstruction technique. COMPARISON:  None available. FINDINGS: Brain: Examination moderately degraded by motion artifact. No acute intracranial hemorrhage. No visible acute large vessel territory infarct. No mass lesion, midline shift or mass effect. No hydrocephalus. No visible extra-axial fluid collection. Vascular: No visible abnormal hyperdense vessel. Skull: Scalp soft tissues and calvarium within normal limits. Sinuses/Orbits: Globes orbital soft tissues demonstrate no acute finding. Mild left sphenoid sinus disease. Paranasal sinuses are otherwise clear. Other: No mastoid effusion. ASPECTS [name] Stroke Program Early CT Score) - Ganglionic level infarction (caudate, lentiform nuclei, internal capsule, insula, M1-M3 cortex): 7 - Supraganglionic infarction (M4-M6  cortex): 3 Total score (0-10 with 10 being normal): 10 IMPRESSION: 1. Motion degraded exam. No definite acute intracranial abnormality. 2. ASPECTS is 10. These results were communicated to Dr. Rory Percy at 7:18 pm on 01/01/2022 by text page via the [facility] messaging system. Electronically Signed   By: Jeannine Boga M.D.   On: 01/01/2022 19:21    EKG: Independently reviewed.  Sinus rhythm with prolonged QTc.  Assessment/Plan Principal Problem:   Sepsis ([place]) Active Problems:   AKI (acute kidney injury) ([place])   Hypotension due to hypovolemia   Acute cystitis without hematuria   High anion gap metabolic acidosis   Seizure ([place])   Shock ([place])    Shock -could be due to hypovolemia or sepsis.  Continue hydration antibiotics follow cultures follow lactate.  Had a similar picture of presentation last December 2022. Abnormal CT perfusion study concerning for possible stroke for which neurology has been consulted.  MRI brain is pending.  EEG as per the neurology was unremarkable.  We will await further recommendations from neurology. Acute renal failure likely from diarrhea which I think will improve with hydration. Thrombocytopenia could be from sepsis.  Follow CBC.  Any further worsening will have to check for hemolytic process.  We will check peripheral smear study and LDH. Anemia follow CBC.  Check anemia panel. Diffuse abdominal tenderness with history of lymphocytic colitis I have ordered a CT abdomen.  Which is pending.  On empiric antibiotics.  Check stool for C. difficile. Right shoulder pain with recent right shoulder surgery.  No obvious swelling or redness or erythema. Hyperglycemia hemoglobin A1c was 4.8 today. History of Hypertension holding antihypertensives in the setting of possible sepsis and presentation of hypotension.  Since patient has possible sepsis with presentation of shock will need close monitoring and inpatient status.   DVT prophylaxis: Lovenox.  Note that patient's  platelets are low admitted to hold Lovenox if there is further decline. Code Status: Full code. Family Communication: Patient's daughter. Disposition Plan: To be determined. Consults called: Critical care and neurologist. Admission status: Inpatient.

## 2022-01-02 NOTE — Progress Notes (Signed)
Pharmacy Antibiotic Note  Wanda Valdez is a 76 y.o. female admitted on 01/01/2022 with sepsis.  Pharmacy has been consulted for vancomycin and cefepime dosing.  Patient presented initially as a code stroke with headache and blurry vision and was experiencing seizure-like activity. CT head showed no hemorrhage or infarct. After CT imaging, patient became hypotensive with blood pressure in 60s/50s. Team would like to start broad spectrum antibiotics for sepsis. One dose of metronidazole given in ED.  WBC down to 4.7, afebrile SCr down to 1.16 LA down to 1.6  Plan: Change vancomycin to 750 mg IV q48h (eAUC~485    >> Goal AUC: 400-550    >> Check trough levels at steady state Continue cefepime 2g IV q 24 hours Monitor daily CBC, temp, SCr, and for clinical signs of improvement  F/u cultures and de-escalate antibiotics as able   Height: 4\' 10"  (147.3 cm) Weight: 41.6 kg (91 lb 11.4 oz) IBW/kg (Calculated) : 40.9  Temp (24hrs), Avg:98.8 F (37.1 C), Min:98.1 F (36.7 C), Max:100.3 F (37.9 C)  Recent Labs  Lab 01/01/22 1900 01/01/22 1905 01/01/22 1950 01/01/22 2311 01/02/22 0516  WBC 8.0  --   --   --  4.7  CREATININE 1.60* 1.30*  --   --  1.16*  LATICACIDVEN  --   --  4.1* 2.0* 1.6     Estimated Creatinine Clearance: 25.8 mL/min (A) (by C-G formula based on SCr of 1.16 mg/dL (H)).    No Known Allergies  Antimicrobials this admission: vancomycin 11/2 >>  cefepime 11/2 >>  Metronidazole 11/2 x1  Dose adjustments this admission:  11/3 Vancomycin dose changed from 500mg  q48h to 750mg  q48h  Microbiology results: 11/2 BCx: NGTD <12h  Thank you for allowing pharmacy to be a part of this patient's care.  Luisa Hart, PharmD, BCPS Clinical Pharmacist 01/02/2022 2:20 PM   Please refer to AMION for pharmacy phone number

## 2022-01-02 NOTE — Progress Notes (Signed)
Rapid notified about NIH scale Q 4 hours.   Foster Simpson Rilen Shukla

## 2022-01-02 NOTE — Progress Notes (Addendum)
[facility] PROGRESS NOTE    Progress Note  Wanda Valdez  ZOX:[id number] DOB: 12-Oct-1945 DOA: 01/01/2022 PCP: Pcp, No     Brief Narrative:   Wanda Valdez is an 76 y.o. female past medical history of lymphocytic colitis, essential hypertension with recent right shoulder surgery about a month ago was found to be weak complaining of blurry vision and headache, accompanied episode of diarrhea unable to get out of bed the day prior to admission comes into the ED code stroke was called as he was also found hypotensive in the ED fluid resuscitated started on Levophed, empiric antibiotics, CT of the head was unremarkable CT perfusion study showed hypoperfusion to the right cerebral hemisphere with a stenosis of the right innominate artery    Assessment/Plan:   Circulatory shock: Started briefly on norepinephrine after a few hours he was weaned down off Levophed fluid resuscitated.  Daughter and patient relates she has been having ongoing diarrhea. Source of his shock could be hypovolemic. His blood pressure is holding steady. Continue IV antibiotics restart her steroids as she is chronically on steroids. He was continue on empiric antibiotics. Culture data has been sent.  Blurry vision/headache/abnormal CT perfusion study: Run through the code stroke. 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 MRI is pending EEG showed no evidence of seizures. Awaiting neurology's further recommendation.  Acute kidney injury: With a baseline creatinine of less than 1 on admission 1.3 was fluid resuscitated creatinine this morning is 1.1 likely prerenal azotemia in the setting of shock.  Thrombocytopenia: Normally she is greater than 150, peripheral smear and LDH have been sent  Macrocytic anemia: CBC shows a hemoglobin at baseline anemia panel is pending.  Diffuse abdominal tenderness: CT of the abdomen pelvis  CT of the abdomen showed mild left hydronephrosis no stones, mild bilateral renal atrophy with chronic perinephric stranding, sending transverse colitis, small pleural effusions and trace ascites.  Hyperglycemia: Likely reactive A1c of 4.8.  Essential hypertension: Holding antihypertensive medication   DVT prophylaxis: Lovenox Family Communication:none Status is: Inpatient Remains inpatient appropriate because: Shock    Code Status:     Code Status Orders  (From admission, onward)           Start     Ordered   01/02/22 0310  Full code  Continuous        01/02/22 0310           Code Status History     Date Active Date Inactive Code Status Order ID Comments User Context   01/26/2021 1846 01/30/2021 2111 Full Code [id number]  [name], [name], MD Inpatient   09/19/2020 2142 09/22/2020 1733 Full Code [id number]  Hillary BowGardner, [name], DO ED   09/10/2020 1849 09/13/2020 2020 Full Code [id number]  [name], [name], MD Inpatient         IV Access:   Peripheral IV   Procedures and diagnostic studies:   CT RENAL STONE STUDY  Result Date: 01/02/2022 CLINICAL DATA:  Flank pain.  Kidney stone suspected. EXAM: CT ABDOMEN AND PELVIS WITHOUT CONTRAST TECHNIQUE: Multidetector CT imaging of the abdomen and pelvis was performed following the standard protocol without IV contrast. RADIATION DOSE REDUCTION: This exam was performed according to the departmental dose-optimization program which includes automated exposure control, adjustment of the mA and/or kV according to patient size and/or use of iterative reconstruction technique. COMPARISON:  CTA chest, abdomen and pelvis 09/19/2020. FINDINGS: Lower chest: There  are small layering pleural effusions and posterior basal linear atelectasis. Lung bases are otherwise clear. There is mild cardiomegaly. No pericardial effusion. There are coronary artery calcifications, tortuosity and heavy calcification of the descending thoracic aorta.  Hepatobiliary: Unremarkable unenhanced liver parenchyma. Surgically absent gallbladder as before. No biliary prominence. Pancreas: Mildly prominent pancreatic duct in the head, neck and body segments, unchanged. No other significant findings in the unenhanced pancreas. Spleen: Unremarkable without contrast. Adrenals/Urinary Tract: There is no adrenal mass. There is a 3.4 cm homogeneous thin walled cyst of the upper pole of the left kidney of 15.6 Hounsfield units. No follow-up is recommended. There are additional subcentimeter too small to characterize hypodensities in both kidneys which are also unchanged. Both kidneys show mild atrophic change. Bilateral perinephric stranding is similar. There is no asymmetric or contrast retention bilaterally but there is mild hydroureteronephrosis on the left. There is no appreciable filling defect but a small stone would be missed due to dense contrast in the collecting systems, ureters and bladder. The right renal collecting system and ureter are decompressed. The bladder is unremarkable. Stomach/Bowel: Unremarkable unopacified stomach and small bowel. An appendix is not seen in this patient. There is wall prominence versus nondistention in the ascending and proximal transverse colon. Scattered diverticulosis with the remainder of the large bowel wall is normal thickness. Vascular/Lymphatic: There is extensive aortoiliac and branch vessel atherosclerotic versus. No AAA. No adenopathy. Reproductive: Status post hysterectomy. No adnexal masses. There are numerous pelvic phleboliths. Other: Limited exam due to metallic artifact from multiple overlying wires and due to beam hardening from the patient's right arm in the field. There is trace ascites in the presacral space. There is no free air, free hemorrhage or free fluid Musculoskeletal: Osteopenia, moderate thoracolumbar dextroscoliosis, and degenerative changes. There is advanced facet hypertrophy at L5-S1 with grade 1  degenerative anterolisthesis. No worrisome regional bone lesion. IMPRESSION: 1. Mild left hydroureteronephrosis but no appreciable stone or filling defect is seen. Findings could be due to a recently passed stone or ascending UTI. 2. A small obstructing stone could be obscured due to dense contrast in the collecting systems, ureters and bladder. The patient had a CTA head yesterday. 3. Bilateral mild renal atrophy with chronic perinephric stranding. 4. Ascending and proximal transverse colitis versus nondistention. 5. Diverticulosis. 6. Aortic and coronary artery atherosclerosis. Cardiomegaly. 7. Small pleural effusions. 8. Trace presacral ascites. 9. Osteopenia, scoliosis and degenerative change. Aortic Atherosclerosis (ICD10-I70.0). Electronically Signed   By: Almira Bar M.D.   On: 01/02/2022 04:16   CT ANGIO HEAD NECK W WO CM W PERF (CODE STROKE)  Result Date: 01/01/2022 CLINICAL DATA:  Initial evaluation for neuro deficit, stroke suspected. EXAM: CT ANGIOGRAPHY HEAD AND NECK CT PERFUSION BRAIN TECHNIQUE: Multidetector CT imaging of the head and neck was performed using the standard protocol during bolus administration of intravenous contrast. Multiplanar CT image reconstructions and MIPs were obtained to evaluate the vascular anatomy. Carotid stenosis measurements (when applicable) are obtained utilizing NASCET criteria, using the distal internal carotid diameter as the denominator. Multiphase CT imaging of the brain was performed following IV bolus contrast injection. Subsequent parametric perfusion maps were calculated using RAPID software. RADIATION DOSE REDUCTION: This exam was performed according to the departmental dose-optimization program which includes automated exposure control, adjustment of the mA and/or kV according to patient size and/or use of iterative reconstruction technique. CONTRAST:  77mL OMNIPAQUE IOHEXOL 350 MG/ML SOLN COMPARISON:  Head CT from earlier the same day. FINDINGS: CTA  NECK FINDINGS Aortic arch:  Examination moderately to severely degraded by motion artifact. Aortic arch within normal limits for caliber. Moderate to advanced atheromatous change about the arch and origin of the great vessels. 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 Right carotid system: Right common and internal carotid arteries are tortuous without dissection or occlusion. Atheromatous change about the right carotid bulb without hemodynamically significant greater than 50% stenosis. Left carotid system: Left common and internal carotid arteries are tortuous without dissection or occlusion. Atheromatous change about the left carotid bulb without hemodynamically significant greater than 50% stenosis. Vertebral arteries: Both vertebral arteries arise from subclavian arteries. No other proximal subclavian artery stenosis. Vertebral arteries are tortuous without visible dissection, stenosis or occlusion. Skeleton: No visible discrete or worrisome osseous lesions. Exaggeration of the normal cervical lordosis noted. Other neck: No other visible soft tissue abnormality within the neck. Upper chest: Visualized upper chest demonstrates no acute finding. Review of the MIP images confirms the above findings CTA HEAD FINDINGS Anterior circulation: Evaluation of the intracranial circulation limited by motion artifact. /petrous segments patent bilaterally. Atheromatous change within the carotid siphons without definite high-grade stenosis. A1 segments patent. Grossly normal anterior communicating artery complex. Both anterior cerebral arteries grossly patent to their distal aspects without visible stenosis. M1 segments patent bilaterally. No visible proximal MCA branch occlusion. Distal MCA branches grossly perfused and symmetric. Posterior circulation: Both V4 segments patent without visible stenosis. Both PICA patent. Basilar grossly patent to its  distal aspect without stenosis. Superior cerebral arteries grossly patent bilaterally. Both PCAs grossly patent to their distal aspects without visible stenosis or occlusion. Probable fetal type origin of the left PCA. Venous sinuses: Patent allowing for timing the contrast bolus and motion. Anatomic variants: As above. Review of the MIP images confirms the above findings CT Brain Perfusion Findings: ASPECTS: 10 CBF (<30%) Volume: 27mL Perfusion (Tmax>6.0s) volume: 31mL Mismatch Volume: 81mL Infarction Location:Negative CT perfusion for acute core infarct. 30 cc delayed perfusion within the right cerebral hemisphere, watershed in distribution. 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 IMPRESSION: CTA HEAD AND NECK IMPRESSION: 1. Motion degraded exam. 2. Negative CTA, with no visible acute large vessel occlusion. 3. 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 4. Mild-to-moderate atheromatous change elsewhere about the major arterial vasculature of the head and neck. No other visible proximal high-grade or correctable stenosis. 5. Diffuse tortuosity of the major arterial vasculature of the head and neck, suggesting chronic underlying hypertension. CT PERFUSION IMPRESSION: 1. No evidence for acute core infarct. 2. 30 mL area of delayed perfusion within the right cerebral hemisphere, watershed in distribution. 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 Case discussed by telephone at the time of interpretation on 01/01/2022 at 7:25 p.m. with provider ASHISH ARORA. Electronically Signed   By: Rise Mu M.D.   On: 01/01/2022 20:21   DG Chest Port 1 View  Result Date: 01/01/2022 CLINICAL DATA:  Weakness EXAM: PORTABLE CHEST 1 VIEW COMPARISON:  10/15/2021 FINDINGS: Lungs are clear. No pneumothorax or pleural effusion. 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 Cardiac size is within normal limits. Pulmonary vascularity is normal. Right total shoulder arthroplasty has been performed. No acute bone abnormality. IMPRESSION: 1. No active disease. 2. 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 This could be better assessed with a standard two view chest radiograph or CT imaging if indicated. Electronically Signed   By: Helyn Numbers M.D.   On: 01/01/2022 19:53   CT HEAD CODE STROKE WO CONTRAST  Result Date: 01/01/2022 CLINICAL DATA:  Code stroke. Initial evaluation for neuro deficit, headache with blurry vision. EXAM: CT HEAD WITHOUT CONTRAST TECHNIQUE: Contiguous axial images were obtained from the base of the skull through the vertex without intravenous contrast. RADIATION DOSE REDUCTION: This exam was performed according to the departmental dose-optimization program which includes automated exposure control, adjustment of the mA and/or kV according to patient size and/or use of iterative reconstruction technique. COMPARISON:  None available. FINDINGS: Brain: Examination moderately degraded by motion artifact. No acute intracranial hemorrhage. No visible acute large vessel territory infarct. No mass lesion, midline shift or mass effect. No hydrocephalus. No visible extra-axial fluid collection. Vascular: No visible abnormal hyperdense vessel. Skull: Scalp soft tissues and calvarium within normal limits. Sinuses/Orbits: Globes orbital soft tissues demonstrate no acute finding. Mild left sphenoid sinus disease. Paranasal sinuses are otherwise clear. Other: No mastoid effusion. ASPECTS [facility] Stroke Program Early CT Score) - Ganglionic level infarction (caudate, lentiform nuclei, internal capsule, insula, M1-M3 cortex): 7 - Supraganglionic infarction (M4-M6 cortex): 3 Total score (0-10 with 10 being normal): 10  IMPRESSION: 1. Motion degraded exam. No definite acute intracranial abnormality. 2. ASPECTS is 10. These results were communicated to Dr. Wilford Corner at 7:18 pm on 01/01/2022 by text page via the [facility] messaging system. Electronically Signed   By: Rise Mu M.D.   On: 01/01/2022 19:21     Medical Consultants:   None.   Subjective:    Wanda Valdez complaining of of right shoulder pain.  Objective:    Vitals:   01/02/22 0445 01/02/22 0500 01/02/22 0515 01/02/22 0645  BP: 134/89 (!) 134/95 129/85 (!) 126/90  Pulse: 62 63 64 68  Resp: 16 16 18  (!) 22  Temp:   98.2 F (36.8 C)   TempSrc:   Oral   SpO2: 97% 100% 98% 96%  Weight:      Height:       SpO2: 96 %   Intake/Output Summary (Last 24 hours) at 01/02/2022 0659 Last data filed at 01/01/2022 2330 Gross per 24 hour  Intake 15.91 ml  Output --  Net 15.91 ml   Filed Weights   01/01/22 1903  Weight: 41.6 kg    Exam: General exam: In no acute distress. Respiratory system: Good air movement and clear to auscultation. Cardiovascular system: S1 & S2 heard, RRR. No JVD. Gastrointestinal system: Abdomen is nondistended, soft and nontender.  Extremities: No pedal edema. Skin: No rashes, lesions or ulcers Psychiatry: Judgement and insight appear normal. Mood & affect appropriate.    Data Reviewed:    Labs: Basic Metabolic Panel: Recent Labs  Lab 01/01/22 1900 01/01/22 1905 01/02/22 0516  NA 142 139  --   K 5.0 4.8  --   CL 107 110  --   CO2 13*  --   --   GLUCOSE 215* 211*  --   BUN 13 10  --   CREATININE 1.60* 1.30* 1.16*  CALCIUM 9.9  --   --    GFR Estimated Creatinine Clearance: 25.8 mL/min (A) (by C-G formula based on SCr of 1.16 mg/dL (H)). Liver Function Tests: Recent Labs  Lab 01/01/22 1900  AST 31  ALT 15  ALKPHOS 87  BILITOT 0.7  PROT 6.3*  ALBUMIN 3.3*   No results for input(s): "LIPASE", "AMYLASE" in the last 168 hours. Recent Labs  Lab 01/01/22 1950  AMMONIA 33    Coagulation profile Recent Labs  Lab 01/01/22 1900  INR 1.0   COVID-19 Labs  No results for input(s): "DDIMER", "FERRITIN", "LDH", "CRP" in the last 72 hours.  Lab Results  Component Value Date   SARSCOV2NAA NEGATIVE 01/26/2021   [place] NEGATIVE 09/19/2020   [place] NEGATIVE 09/10/2020    CBC: Recent Labs  Lab 01/01/22 1900 01/01/22 1905 01/02/22 0516  WBC 8.0  --  4.7  NEUTROABS 3.0  --   --   HGB 13.2 13.9 11.2*  HCT 40.6 41.0 33.9*  MCV 104.6*  --  102.4*  PLT 262  --  83*   Cardiac Enzymes: No results for input(s): "CKTOTAL", "CKMB", "CKMBINDEX", "TROPONINI" in the last 168 hours. BNP (last 3 results) No results for input(s): "PROBNP" in the last 8760 hours. CBG: Recent Labs  Lab 01/01/22 1859  GLUCAP 217*   D-Dimer: No results for input(s): "DDIMER" in the last 72 hours. Hgb A1c: Recent Labs    01/02/22 0516  HGBA1C 4.8   Lipid Profile: Recent Labs    01/02/22 0516  CHOL 167  HDL 82  LDLCALC 76  TRIG 45  CHOLHDL 2.0   Thyroid function studies: Recent Labs    01/01/22 1950  TSH 1.418   Anemia work up: Recent Labs    01/01/22 1950  [place] 316   Sepsis Labs: Recent Labs  Lab 01/01/22 1900 01/01/22 1950 01/01/22 2311 01/02/22 0516  WBC 8.0  --   --  4.7  LATICACIDVEN  --  4.1* 2.0* 1.6   Microbiology Recent Results (from the past 240 hour(s))  Culture, blood (routine x 2)     Status: None (Preliminary result)   Collection Time: 01/01/22  7:50 PM   Specimen: BLOOD RIGHT WRIST  Result Value Ref Range Status   Specimen Description BLOOD RIGHT WRIST  Final   Special Requests   Final    BOTTLES DRAWN AEROBIC AND ANAEROBIC Blood Culture results may not be optimal due to an inadequate volume of blood received in culture bottles Performed at [facility], [place] [address], [place], [place] [address]    Culture PENDING  Incomplete   Report Status PENDING  Incomplete     Medications:    [START ON 01/03/2022]   stroke: early stages of recovery book   Does not apply Once   enoxaparin (LOVENOX) injection  30 mg Subcutaneous Q24H   Continuous Infusions:  sodium chloride Stopped (01/02/22 0412)   ceFEPime (MAXIPIME) IV Stopped (01/01/22 2126)   lactated ringers 100 mL/hr at 01/02/22 0518   [START ON 01/03/2022] vancomycin        LOS: 1 day   Charlynne Cousins  [facility]  01/02/2022, 6:59 AM

## 2022-01-02 NOTE — Progress Notes (Signed)
Messaged for diet order.   Wanda Valdez Wanda Valdez

## 2022-01-02 NOTE — ED Notes (Signed)
Call [facility] twice to transport pt to MRI, only got voicemail

## 2022-01-02 NOTE — Progress Notes (Signed)
EEG complete - results pending 

## 2022-01-02 NOTE — Progress Notes (Signed)
Preliminary EEG review negative for seizures.  Formal read pending.  Neurology will follow.  -- Amie Portland, MD Neurologist [facility] Pager: [phone]

## 2022-01-02 NOTE — ED Notes (Signed)
Rn Ask lab if she could draw patient labs, IV unable to pull blood

## 2022-01-03 ENCOUNTER — Inpatient Hospital Stay (HOSPITAL_COMMUNITY): Payer: Medicare Other

## 2022-01-03 DIAGNOSIS — I9589 Other hypotension: Secondary | ICD-10-CM | POA: Diagnosis not present

## 2022-01-03 DIAGNOSIS — E8729 Other acidosis: Secondary | ICD-10-CM | POA: Diagnosis not present

## 2022-01-03 DIAGNOSIS — R579 Shock, unspecified: Secondary | ICD-10-CM | POA: Diagnosis not present

## 2022-01-03 DIAGNOSIS — R569 Unspecified convulsions: Secondary | ICD-10-CM | POA: Diagnosis not present

## 2022-01-03 DIAGNOSIS — N179 Acute kidney failure, unspecified: Secondary | ICD-10-CM | POA: Diagnosis not present

## 2022-01-03 LAB — GLUCOSE, CAPILLARY
Glucose-Capillary: 67 mg/dL — ABNORMAL LOW (ref 70–99)
Glucose-Capillary: 69 mg/dL — ABNORMAL LOW (ref 70–99)
Glucose-Capillary: 116 mg/dL — ABNORMAL HIGH (ref 70–99)
Glucose-Capillary: 106 mg/dL — ABNORMAL HIGH (ref 70–99)
Glucose-Capillary: 122 mg/dL — ABNORMAL HIGH (ref 70–99)
Glucose-Capillary: 103 mg/dL — ABNORMAL HIGH (ref 70–99)

## 2022-01-03 LAB — CBC WITH DIFFERENTIAL/PLATELET
Abs Immature Granulocytes: 0.01 10*3/uL (ref 0.00–0.07)
Basophils Absolute: 0.1 10*3/uL (ref 0.0–0.1)
Basophils Relative: 1 %
Eosinophils Absolute: 0 10*3/uL (ref 0.0–0.5)
Eosinophils Relative: 1 %
HCT: 35.1 % — ABNORMAL LOW (ref 36.0–46.0)
Hemoglobin: 11.6 g/dL — ABNORMAL LOW (ref 12.0–15.0)
Immature Granulocytes: 0 %
Lymphocytes Relative: 22 %
Lymphs Abs: 0.9 10*3/uL (ref 0.7–4.0)
MCH: 33.4 pg (ref 26.0–34.0)
MCHC: 33 g/dL (ref 30.0–36.0)
MCV: 101.2 fL — ABNORMAL HIGH (ref 80.0–100.0)
Monocytes Absolute: 0.6 10*3/uL (ref 0.1–1.0)
Monocytes Relative: 14 %
Neutro Abs: 2.7 10*3/uL (ref 1.7–7.7)
Neutrophils Relative %: 62 %
Platelets: 182 10*3/uL (ref 150–400)
RBC: 3.47 MIL/uL — ABNORMAL LOW (ref 3.87–5.11)
RDW: 14.4 % (ref 11.5–15.5)
WBC: 4.4 10*3/uL (ref 4.0–10.5)
nRBC: 0 % (ref 0.0–0.2)

## 2022-01-03 LAB — BASIC METABOLIC PANEL
Anion gap: 15 (ref 5–15)
BUN: 14 mg/dL (ref 8–23)
CO2: 27 mmol/L (ref 22–32)
Calcium: 8.7 mg/dL — ABNORMAL LOW (ref 8.9–10.3)
Chloride: 100 mmol/L (ref 98–111)
Creatinine, Ser: 1.2 mg/dL — ABNORMAL HIGH (ref 0.44–1.00)
GFR, Estimated: 46 mL/min — ABNORMAL LOW (ref >60)
Glucose, Bld: 107 mg/dL — ABNORMAL HIGH (ref 70–99)
Potassium: 3.2 mmol/L — ABNORMAL LOW (ref 3.5–5.1)
Sodium: 142 mmol/L (ref 135–145)

## 2022-01-03 LAB — URINE CULTURE

## 2022-01-03 LAB — OCCULT BLOOD X 1 CARD TO LAB, STOOL: Fecal Occult Bld: NEGATIVE

## 2022-01-03 MED ORDER — DEXTROSE 50 % IV SOLN
INTRAVENOUS | Status: AC
  Administered 2022-01-03: 50 mL
  Filled 2022-01-03: qty 50

## 2022-01-03 MED ORDER — PANCRELIPASE (LIP-PROT-AMYL) 12000-38000 UNITS PO CPEP
24000.0000 [IU] | ORAL_CAPSULE | Freq: Three times a day (TID) | ORAL | Status: DC
  Administered 2022-01-03 – 2022-01-09 (×14): 24000 [IU] via ORAL
  Filled 2022-01-03 (×19): qty 2

## 2022-01-03 MED ORDER — LEVETIRACETAM 500 MG PO TABS
500.0000 mg | ORAL_TABLET | Freq: Two times a day (BID) | ORAL | Status: DC
  Administered 2022-01-03 – 2022-01-08 (×11): 500 mg via ORAL
  Filled 2022-01-03 (×11): qty 1

## 2022-01-03 MED ORDER — ATORVASTATIN CALCIUM 10 MG PO TABS
20.0000 mg | ORAL_TABLET | Freq: Every day | ORAL | Status: DC
  Administered 2022-01-03 – 2022-01-09 (×7): 20 mg via ORAL
  Filled 2022-01-03 (×7): qty 2

## 2022-01-03 MED ORDER — METOPROLOL TARTRATE 25 MG PO TABS
25.0000 mg | ORAL_TABLET | Freq: Every day | ORAL | Status: DC
  Administered 2022-01-03 – 2022-01-09 (×7): 25 mg via ORAL
  Filled 2022-01-03 (×7): qty 1

## 2022-01-03 MED ORDER — POTASSIUM CHLORIDE CRYS ER 20 MEQ PO TBCR
40.0000 meq | EXTENDED_RELEASE_TABLET | Freq: Two times a day (BID) | ORAL | Status: AC
  Administered 2022-01-03 (×2): 40 meq via ORAL
  Filled 2022-01-03 (×2): qty 2

## 2022-01-03 MED ORDER — CHOLESTYRAMINE 4 G PO PACK
1.0000 | PACK | Freq: Two times a day (BID) | ORAL | Status: DC
  Administered 2022-01-03 – 2022-01-09 (×10): 1 via ORAL
  Filled 2022-01-03 (×15): qty 1

## 2022-01-03 MED ORDER — PANTOPRAZOLE SODIUM 40 MG PO TBEC
40.0000 mg | DELAYED_RELEASE_TABLET | Freq: Every day | ORAL | Status: DC
  Administered 2022-01-03 – 2022-01-09 (×7): 40 mg via ORAL
  Filled 2022-01-03 (×7): qty 1

## 2022-01-03 MED ORDER — ALLOPURINOL 100 MG PO TABS
100.0000 mg | ORAL_TABLET | Freq: Every day | ORAL | Status: DC
  Administered 2022-01-03 – 2022-01-09 (×7): 100 mg via ORAL
  Filled 2022-01-03 (×7): qty 1

## 2022-01-03 MED ORDER — SUCRALFATE 1 G PO TABS
1.0000 g | ORAL_TABLET | Freq: Three times a day (TID) | ORAL | Status: DC
  Administered 2022-01-03 – 2022-01-09 (×19): 1 g via ORAL
  Filled 2022-01-03 (×19): qty 1

## 2022-01-03 NOTE — Progress Notes (Signed)
MRI rescheduled for the morning because patient is unable to remain still. She constantly removes devices.  Wanda Valdez

## 2022-01-03 NOTE — Evaluation (Signed)
Physical Therapy Evaluation Patient Details Name: Wanda Valdez MRN: [id number] DOB: 09/15/1945 Today's Date: 01/03/2022  History of Present Illness  Wanda Valdez is a 76 y.o. female who was found to be weak complaining of blurred vision and headache was brought to the ER; hypotension, shock (hypovolemic or septic), abnormal CT perfusion study concerning for possible stroke; ARF likely from diarrhea, thrombocytopenia, abdominal tenderness, R shoulder pain (had surgery within the past month); with history of lymphocytic colitis, hypertension who had a recent right shoulder surgery about a month ago  Clinical Impression   Pt admitted with above diagnosis. Lives at home with her daughter, in a two-level home (bedroom is upstairs) with afew steps to enter; Prior to admission, pt was able to manage mobiltiy independently, needed assist with ADLs due to recent R shoulder surgery; Presents to PT with decr functional mobility, decr balance, fear of falling;  Min assist to stand, heavy mod assist to keep balance while step-pivoting to recliner; Noted pt performed better with OT, and hopefully she will make good, quick progress; Pt currently with functional limitations due to the deficits listed below (see PT Problem List). Pt will benefit from skilled PT to increase their independence and safety with mobility to allow discharge to the venue listed below.          Recommendations for follow up therapy are one component of a multi-disciplinary discharge planning process, led by the attending physician.  Recommendations may be updated based on patient status, additional functional criteria and insurance authorization.  Follow Up Recommendations Home health PT      Assistance Recommended at Discharge Intermittent Supervision/Assistance  Patient can return home with the following  A little help with walking and/or transfers    Equipment Recommendations Rollator (4 wheels)  Recommendations for  Other Services  OT consult    Functional Status Assessment Patient has had a recent decline in their functional status and demonstrates the ability to make significant improvements in function in a reasonable and predictable amount of time.     Precautions / Restrictions Precautions Precautions: Fall;Shoulder Type of Shoulder Precautions: recent R shoulder surgery, no sling and unable to locate any precautions/restrictions. Pt states surgery was August 2023 although current chart says 1 month ago Restrictions Other Position/Activity Restrictions: Uncertain at this time, recent shoulder surgery August or Ocotber 2023??      Mobility  Bed Mobility               General bed mobility comments: Sitting EOB upon PT arrival    Transfers Overall transfer level: Needs assistance Equipment used: 1 person hand held assist Transfers: Sit to/from Stand, Bed to chair/wheelchair/BSC Sit to Stand: Min assist   Step pivot transfers: Mod assist       General transfer comment: Min assist to seady with sit to stand; Heavy mod assist to take steps bed to chair; steps with wide BOS and festinating-like quality    Ambulation/Gait               General Gait Details: Very fearful of falling  Stairs            Wheelchair Mobility    Modified Rankin (Stroke Patients Only)       Balance Overall balance assessment: Needs assistance   Sitting balance-Leahy Scale: Good       Standing balance-Leahy Scale: Poor Standing balance comment: mod assist to maintain balance during step pivot to recliner  Pertinent Vitals/Pain Pain Assessment Pain Assessment: No/denies pain    Home Living Family/patient expects to be discharged to:: Private residence Living Arrangements: Children Available Help at Discharge: Family Type of Home: House         Home Layout: Laundry or work area in basement;Able to live on main level with  bedroom/bathroom Home Equipment: Kasandra Knudsen - single point Additional Comments: Above gleaned form chart review; will need verification    Prior Function Prior Level of Function : Independent/Modified Independent             Mobility Comments: SPC intermittently ADLs Comments: pt states that her daughter has been helping her with dressing since September     Hand Dominance   Dominant Hand: Right    Extremity/Trunk Assessment   Upper Extremity Assessment Upper Extremity Assessment: Defer to OT evaluation RUE Deficits / Details: R shoulder surgery, uncertain of timeline of surgery and if any current limitations and restrictions. pt not wearing a sling and with very limited AROM    Lower Extremity Assessment Lower Extremity Assessment: Generalized weakness       Communication   Communication: No difficulties  Cognition Arousal/Alertness: Awake/alert Behavior During Therapy: WFL for tasks assessed/performed Overall Cognitive Status: History of cognitive impairments - at baseline                                 General Comments: pt providing conflicting info about PLOF        General Comments General comments (skin integrity, edema, etc.): Pt values her indpeendence quite a lot    Exercises     Assessment/Plan    PT Assessment Patient needs continued PT services  PT Problem List Decreased strength;Decreased range of motion;Decreased activity tolerance;Decreased balance;Decreased mobility;Decreased coordination;Decreased cognition;Decreased knowledge of use of DME;Decreased safety awareness;Decreased knowledge of precautions       PT Treatment Interventions DME instruction;Gait training;Stair training;Functional mobility training;Therapeutic activities;Therapeutic exercise;Balance training;Neuromuscular re-education;Patient/family education;Cognitive remediation    PT Goals (Current goals can be found in the Care Plan section)  Acute Rehab PT Goals Patient  Stated Goal: Did not specifically state PT Goal Formulation: With patient Time For Goal Achievement: 01/17/22 Potential to Achieve Goals: Good    Frequency Min 3X/week     Co-evaluation               AM-PAC PT "6 Clicks" Mobility  Outcome Measure Help needed turning from your back to your side while in a flat bed without using bedrails?: None Help needed moving from lying on your back to sitting on the side of a flat bed without using bedrails?: None Help needed moving to and from a bed to a chair (including a wheelchair)?: A Lot Help needed standing up from a chair using your arms (e.g., wheelchair or bedside chair)?: A Lot Help needed to walk in hospital room?: A Lot Help needed climbing 3-5 steps with a railing? : A Lot 6 Click Score: 16    End of Session Equipment Utilized During Treatment: Gait belt Activity Tolerance: Patient tolerated treatment well Patient left: in chair;with call bell/phone within reach;with chair alarm set Nurse Communication: Mobility status PT Visit Diagnosis: Unsteadiness on feet (R26.81);Muscle weakness (generalized) (M62.81)    Time: 1610-9604 PT Time Calculation (min) (ACUTE ONLY): 25 min   Charges:   PT Evaluation $PT Eval Moderate Complexity: 1 Mod PT Treatments $Therapeutic Activity: 8-22 mins        Roney Marion,  PT  Acute Rehabilitation Services Office [phone]   [place]letta [place] 01/03/2022, 2:44 PM

## 2022-01-03 NOTE — Progress Notes (Signed)
[facility] PROGRESS NOTE    Progress Note  Wanda Valdez  XBM:[id number] DOB: 11-Apr-1945 DOA: 01/01/2022 PCP: Pcp, No     Brief Narrative:   Wanda Valdez is an 76 y.o. female past medical history of lymphocytic colitis, essential hypertension with recent right shoulder surgery about a month ago was found to be weak complaining of blurry vision and headache, accompanied episode of diarrhea unable to get out of bed the day prior to admission comes into the ED code stroke was called as he was also found hypotensive in the ED fluid resuscitated started on Levophed, empiric antibiotics, CT of the head was unremarkable CT perfusion study showed hypoperfusion to the right cerebral hemisphere with a stenosis of the right innominate artery   Assessment/Plan:   Circulatory shock: Required pressors temporarily  for couple of hours.  Of unclear etiology Daughter and patient relates she has been having ongoing diarrhea. Blood pressure has remained stable. Continue IV antibiotics restart her steroids as she is chronically on steroids. Urine culture inconclusive, blood cultures are pending EEG showed possible seizures, will consult neurology.  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 MRI of the brain is pending. Consult neurology.  Acute kidney injury: With a baseline creatinine of less than 1 on admission 1.3 . She has been fluid resuscitated basic metabolic panels pending this morning.  Thrombocytopenia: Normally she is greater than 150,  LDH 154. CBC is pending this morning  Macrocytic anemia: CBC shows a hemoglobin at baseline, anemia  panel showed ferritin 55 will need further evaluation as an out patient on colonoscopy, will start on ferrous sulfate upon discharge.  Diffuse abdominal tenderness: CT of the abdomen pelvis CT of the abdomen showed mild left hydronephrosis no stones, mild bilateral renal atrophy with chronic perinephric stranding,  transverse colitis, small pleural effusions and trace ascites.  No acute abnormalities to explain her diffuse tenderness. Resume Creon. FOBT stools, she was on steroids and NSAID her hemoglobin has remained relatively stable.  Hyperglycemia: Likely reactive A1c of 4.8.  Discontinue all insulins.  Essential hypertension: Blood pressure is running up   DVT prophylaxis: Lovenox Family Communication:none Status is: Inpatient Remains inpatient appropriate because: Shock    Code Status:     Code Status Orders  (From admission, onward)           Start     Ordered   01/02/22 0310  Full code  Continuous        01/02/22 0310           Code Status History     Date Active Date Inactive Code Status Order ID Comments User Context   01/26/2021 1846 01/30/2021 2111 Full Code [id number]  Lupita Leash, MD Inpatient   09/19/2020 2142 09/22/2020 1733 Full Code [id number]  Hillary Bow, DO ED   09/10/2020 1849 09/13/2020 2020 Full Code [id number]  Rodolph Bong, MD Inpatient         IV Access:   Peripheral IV   Procedures and diagnostic studies:   ECHOCARDIOGRAM COMPLETE  Result Date: 01/02/2022    ECHOCARDIOGRAM REPORT   Patient Name:   Wanda Valdez Date of Exam: 01/02/2022 Medical Rec #:  [id number]  Height:       58.0 in Accession #:    [id number]         Weight:       91.7 lb Date of Birth:  1945-10-29          BSA:          1.307 m Patient Age:    76 years           BP:           126/90 mmHg Patient Gender: F                  HR:           62 bpm. Exam Location:  Inpatient Procedure: 2D Echo, Cardiac Doppler and Color Doppler Indications:     Stroke  History:        Patient has prior history of Echocardiogram examinations, most                 recent 09/20/2020. Risk Factors:Hypertension.  Sonographer:    Gaynell Face Referring Phys: [id number] ARSHAD N KAKRAKANDY IMPRESSIONS  1. Left ventricular ejection fraction, by estimation, is 60 to 65%. The left ventricle has normal function. The left ventricle has no regional wall motion abnormalities. Left ventricular diastolic parameters are consistent with Grade I diastolic dysfunction (impaired relaxation).  2. Right ventricular systolic function is normal. The right ventricular size is normal.  3. Left atrial size was moderately dilated.  4. The mitral valve is normal in structure. Trivial mitral valve regurgitation. No evidence of mitral stenosis.  5. The aortic valve was not well visualized. Aortic valve regurgitation is not visualized. No aortic stenosis is present.  6. Pulmonic valve regurgitation not well visualized. FINDINGS  Left Ventricle: Left ventricular ejection fraction, by estimation, is 60 to 65%. The left ventricle has normal function. The left ventricle has no regional wall motion abnormalities. The left ventricular internal cavity size was normal in size. There is  no left ventricular hypertrophy. Left ventricular diastolic parameters are consistent with Grade I diastolic dysfunction (impaired relaxation). Right Ventricle: The right ventricular size is normal. Right ventricular systolic function is normal. Left Atrium: Left atrial size was moderately dilated. Right Atrium: Right atrial size was normal in size. Pericardium: There is no evidence of pericardial effusion. Mitral Valve: The mitral valve is normal in structure. Mild mitral annular calcification. Trivial mitral valve regurgitation. No evidence of mitral valve stenosis. Tricuspid Valve: The tricuspid valve is normal in structure. Tricuspid valve regurgitation is trivial. No evidence of tricuspid stenosis. Aortic Valve: The aortic valve  was not well visualized. Aortic valve regurgitation is not visualized. No aortic stenosis is present. Aortic valve mean gradient measures 3.0 mmHg. Aortic valve peak gradient measures 5.1 mmHg. Pulmonic Valve: The pulmonic valve was not well visualized. Pulmonic valve regurgitation not well visualized. Aorta: The aortic root is normal in size and structure. Venous: The inferior vena cava was not well visualized. IAS/Shunts: No atrial level shunt detected by color flow Doppler.  LEFT VENTRICLE PLAX 2D LVIDd:         3.90 cm   Diastology LVIDs:         2.60 cm   LV e' medial:    5.59 cm/s LV PW:         1.00 cm   LV E/e' medial:  12.5 LV IVS:        1.00 cm   LV e' lateral:   8.70 cm/s LVOT diam:  2.00 cm   LV E/e' lateral: 8.0 LVOT Area:     3.14 cm  RIGHT VENTRICLE TAPSE (M-Valdez): 1.7 cm LEFT ATRIUM             Index        RIGHT ATRIUM          Index LA Vol (A2C):   57.0 ml 43.60 ml/m  RA Area:     8.38 cm LA Vol (A4C):   57.6 ml 44.05 ml/m  RA Volume:   13.40 ml 10.25 ml/m LA Biplane Vol: 59.8 ml 45.74 ml/m  AORTIC VALVE AV Vmax:      113.00 cm/s AV Vmean:     77.200 cm/s AV VTI:       0.260 m AV Peak Grad: 5.1 mmHg AV Mean Grad: 3.0 mmHg  AORTA Ao Root diam: 3.20 cm MITRAL VALVE MV Area (PHT): 3.03 cm    SHUNTS MV Decel Time: 250 msec    Systemic Diam: 2.00 cm MV E velocity: 69.60 cm/s MV A velocity: 89.20 cm/s MV E/A ratio:  0.78 Olga Millers MD Electronically signed by Olga Millers MD Signature Date/Time: 01/02/2022/1:12:09 PM    Final    EEG adult  Result Date: 01/02/2022 Charlsie Quest, MD     01/02/2022  9:17 AM Patient Name: Wanda Valdez MRN: [id number] Epilepsy Attending: Charlsie Quest Referring Physician/Provider: Milon Dikes, MD Date: 01/01/2022 Duration: 23.14 mins Patient history: 76 year old female with jerking episodes.  EEG to evaluate for seizure. Level of alertness: Awake AEDs during EEG study: Ativan, LEV Technical aspects: This EEG study was done with scalp electrodes  positioned according to the 10-20 International system of electrode placement. Electrical activity was reviewed with band pass filter of 1-70Hz , sensitivity of 7 uV/mm, display speed of 31mm/sec with a  notched filter applied as appropriate. EEG data were recorded continuously and digitally stored.  Video monitoring was available and reviewed as appropriate. Description: The posterior dominant rhythm consists of 7.5 Hz activity of moderate voltage (25-35 uV) seen predominantly in posterior head regions, symmetric and reactive to eye opening and eye closing.  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  Sharp waves were noted in right occipital region which at times appeared periodic with fluctuating frequency of 0.25 to 0.5 Hz ABNORMALITY -Sharp wave, right occipital region -Continuous slow, right hemisphere IMPRESSION: This study showed evidence of epileptogenicity arising from right occipital region.  Additionally there is cortical dysfunction in right hemisphere likely secondary to underlying structural abnormality, postictal state.  No seizures were seen during the study. Charlsie Quest   CT RENAL STONE STUDY  Result Date: 01/02/2022 CLINICAL DATA:  Flank pain.  Kidney stone suspected. EXAM: CT ABDOMEN AND PELVIS WITHOUT CONTRAST TECHNIQUE: Multidetector CT imaging of the abdomen and pelvis was performed following the standard protocol without IV contrast. RADIATION DOSE REDUCTION: This exam was performed according to the departmental dose-optimization program which includes automated exposure control, adjustment of the mA and/or kV according to patient size and/or use of iterative reconstruction technique. COMPARISON:  CTA chest, abdomen and pelvis 09/19/2020. FINDINGS: Lower chest: There are small layering pleural effusions and posterior basal linear atelectasis. Lung bases are otherwise clear. There is mild cardiomegaly. No pericardial effusion. There are coronary artery  calcifications, tortuosity and heavy calcification of the descending thoracic aorta. Hepatobiliary: Unremarkable unenhanced liver parenchyma. Surgically absent gallbladder as before. No biliary prominence. Pancreas: Mildly prominent pancreatic duct in the head, neck and body segments, unchanged. No other significant findings  in the unenhanced pancreas. Spleen: Unremarkable without contrast. Adrenals/Urinary Tract: There is no adrenal mass. There is a 3.4 cm homogeneous thin walled cyst of the upper pole of the left kidney of 15.6 Hounsfield units. No follow-up is recommended. There are additional subcentimeter too small to characterize hypodensities in both kidneys which are also unchanged. Both kidneys show mild atrophic change. Bilateral perinephric stranding is similar. There is no asymmetric or contrast retention bilaterally but there is mild hydroureteronephrosis on the left. There is no appreciable filling defect but a small stone would be missed due to dense contrast in the collecting systems, ureters and bladder. The right renal collecting system and ureter are decompressed. The bladder is unremarkable. Stomach/Bowel: Unremarkable unopacified stomach and small bowel. An appendix is not seen in this patient. There is wall prominence versus nondistention in the ascending and proximal transverse colon. Scattered diverticulosis with the remainder of the large bowel wall is normal thickness. Vascular/Lymphatic: There is extensive aortoiliac and branch vessel atherosclerotic versus. No AAA. No adenopathy. Reproductive: Status post hysterectomy. No adnexal masses. There are numerous pelvic phleboliths. Other: Limited exam due to metallic artifact from multiple overlying wires and due to beam hardening from the patient's right arm in the field. There is trace ascites in the presacral space. There is no free air, free hemorrhage or free fluid Musculoskeletal: Osteopenia, moderate thoracolumbar dextroscoliosis, and  degenerative changes. There is advanced facet hypertrophy at L5-S1 with grade 1 degenerative anterolisthesis. No worrisome regional bone lesion. IMPRESSION: 1. Mild left hydroureteronephrosis but no appreciable stone or filling defect is seen. Findings could be due to a recently passed stone or ascending UTI. 2. A small obstructing stone could be obscured due to dense contrast in the collecting systems, ureters and bladder. The patient had a CTA head yesterday. 3. Bilateral mild renal atrophy with chronic perinephric stranding. 4. Ascending and proximal transverse colitis versus nondistention. 5. Diverticulosis. 6. Aortic and coronary artery atherosclerosis. Cardiomegaly. 7. Small pleural effusions. 8. Trace presacral ascites. 9. Osteopenia, scoliosis and degenerative change. Aortic Atherosclerosis (ICD10-I70.0). Electronically Signed   By: Almira Bar M.D.   On: 01/02/2022 04:16   CT ANGIO HEAD NECK W WO CM W PERF (CODE STROKE)  Result Date: 01/01/2022 CLINICAL DATA:  Initial evaluation for neuro deficit, stroke suspected. EXAM: CT ANGIOGRAPHY HEAD AND NECK CT PERFUSION BRAIN TECHNIQUE: Multidetector CT imaging of the head and neck was performed using the standard protocol during bolus administration of intravenous contrast. Multiplanar CT image reconstructions and MIPs were obtained to evaluate the vascular anatomy. Carotid stenosis measurements (when applicable) are obtained utilizing NASCET criteria, using the distal internal carotid diameter as the denominator. Multiphase CT imaging of the brain was performed following IV bolus contrast injection. Subsequent parametric perfusion maps were calculated using RAPID software. RADIATION DOSE REDUCTION: This exam was performed according to the departmental dose-optimization program which includes automated exposure control, adjustment of the mA and/or kV according to patient size and/or use of iterative reconstruction technique. CONTRAST:  2mL OMNIPAQUE  IOHEXOL 350 MG/ML SOLN COMPARISON:  Head CT from earlier the same day. FINDINGS: CTA NECK FINDINGS Aortic arch: Examination moderately to severely degraded by motion artifact. Aortic arch within normal limits for caliber. Moderate to advanced atheromatous change about the arch and origin of the great vessels. 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 Right carotid system: Right common and internal carotid arteries are tortuous without dissection or occlusion. Atheromatous change about the right  carotid bulb without hemodynamically significant greater than 50% stenosis. Left carotid system: Left common and internal carotid arteries are tortuous without dissection or occlusion. Atheromatous change about the left carotid bulb without hemodynamically significant greater than 50% stenosis. Vertebral arteries: Both vertebral arteries arise from subclavian arteries. No other proximal subclavian artery stenosis. Vertebral arteries are tortuous without visible dissection, stenosis or occlusion. Skeleton: No visible discrete or worrisome osseous lesions. Exaggeration of the normal cervical lordosis noted. Other neck: No other visible soft tissue abnormality within the neck. Upper chest: Visualized upper chest demonstrates no acute finding. Review of the MIP images confirms the above findings CTA HEAD FINDINGS Anterior circulation: Evaluation of the intracranial circulation limited by motion artifact. /petrous segments patent bilaterally. Atheromatous change within the carotid siphons without definite high-grade stenosis. A1 segments patent. Grossly normal anterior communicating artery complex. Both anterior cerebral arteries grossly patent to their distal aspects without visible stenosis. M1 segments patent bilaterally. No visible proximal MCA branch occlusion. Distal MCA branches grossly perfused and symmetric. Posterior circulation: Both V4  segments patent without visible stenosis. Both PICA patent. Basilar grossly patent to its distal aspect without stenosis. Superior cerebral arteries grossly patent bilaterally. Both PCAs grossly patent to their distal aspects without visible stenosis or occlusion. Probable fetal type origin of the left PCA. Venous sinuses: Patent allowing for timing the contrast bolus and motion. Anatomic variants: As above. Review of the MIP images confirms the above findings CT Brain Perfusion Findings: ASPECTS: 10 CBF (<30%) Volume: 0mL Perfusion (Tmax>6.0s) volume: 30mL Mismatch Volume: 30mL Infarction Location:Negative CT perfusion for acute core infarct. 30 cc delayed perfusion within the right cerebral hemisphere, watershed in distribution. 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 IMPRESSION: CTA HEAD AND NECK IMPRESSION: 1. Motion degraded exam. 2. Negative CTA, with no visible acute large vessel occlusion. 3. 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 4. Mild-to-moderate atheromatous change elsewhere about the major arterial vasculature of the head and neck. No other visible proximal high-grade or correctable stenosis. 5. Diffuse tortuosity of the major arterial vasculature of the head and neck, suggesting chronic underlying hypertension. CT PERFUSION IMPRESSION: 1. No evidence for acute core infarct. 2. 30 mL area of delayed perfusion within the right cerebral hemisphere, watershed in distribution. 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 Case discussed by telephone at the time of interpretation on 01/01/2022 at 7:25 p.m. with provider ASHISH ARORA. Electronically Signed   By: Rise Mu M.D.   On: 01/01/2022 20:21   DG Chest Port 1 View  Result Date: 01/01/2022 CLINICAL DATA:  Weakness EXAM: PORTABLE CHEST 1 VIEW COMPARISON:  10/15/2021 FINDINGS: Lungs are clear. No  pneumothorax or pleural effusion. 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 Cardiac size is within normal limits. Pulmonary vascularity is normal. Right total shoulder arthroplasty has been performed. No acute bone abnormality. IMPRESSION: 1. No active disease. 2. 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 This could be better assessed with a standard two view chest radiograph or CT imaging if indicated. Electronically Signed   By: Helyn Numbers M.D.   On: 01/01/2022 19:53   CT HEAD CODE STROKE WO CONTRAST  Result Date: 01/01/2022 CLINICAL DATA:  Code stroke. Initial evaluation for neuro deficit, headache with blurry vision. EXAM: CT HEAD WITHOUT  CONTRAST TECHNIQUE: Contiguous axial images were obtained from the base of the skull through the vertex without intravenous contrast. RADIATION DOSE REDUCTION: This exam was performed according to the departmental dose-optimization program which includes automated exposure control, adjustment of the mA and/or kV according to patient size and/or use of iterative reconstruction technique. COMPARISON:  None available. FINDINGS: Brain: Examination moderately degraded by motion artifact. No acute intracranial hemorrhage. No visible acute large vessel territory infarct. No mass lesion, midline shift or mass effect. No hydrocephalus. No visible extra-axial fluid collection. Vascular: No visible abnormal hyperdense vessel. Skull: Scalp soft tissues and calvarium within normal limits. Sinuses/Orbits: Globes orbital soft tissues demonstrate no acute finding. Mild left sphenoid sinus disease. Paranasal sinuses are otherwise clear. Other: No mastoid effusion. ASPECTS [facility]([place] Stroke Program Early CT Score) - Ganglionic level infarction (caudate, lentiform nuclei, internal capsule, insula, M1-M3 cortex): 7 -  Supraganglionic infarction (M4-M6 cortex): 3 Total score (0-10 with 10 being normal): 10 IMPRESSION: 1. Motion degraded exam. No definite acute intracranial abnormality. 2. ASPECTS is 10. These results were communicated to Dr. Wilford CornerArora at 7:18 pm on 01/01/2022 by text page via the [facility] messaging system. Electronically Signed   By: Rise MuBenjamin  McClintock M.D.   On: 01/01/2022 19:21     Medical Consultants:   None.   Subjective:    Wanda Valdez abdominal pain and shoulder pain are significantly improved.  Objective:    Vitals:   01/02/22 1848 01/02/22 2037 01/03/22 0005 01/03/22 0900  BP: 135/83 116/73 122/85 (!) 148/86  Pulse: 66 (!) 58 62 62  Resp: 19 18 18 18   Temp: 98.1 F (36.7 C) 97.6 F (36.4 C) (!) 97.3 F (36.3 C) 97.6 F (36.4 C)  TempSrc: Oral Oral Oral Oral  SpO2: 100% 100% 99% 95%  Weight:      Height:       SpO2: 95 % O2 Flow Rate (L/min): 3 L/min   Intake/Output Summary (Last 24 hours) at 01/03/2022 0946 Last data filed at 01/03/2022 [id number] Gross per 24 hour  Intake 1620 ml  Output 200 ml  Net 1420 ml    Filed Weights   01/01/22 1903  Weight: 41.6 kg    Exam: General exam: In no acute distress. Respiratory system: Good air movement and clear to auscultation. Cardiovascular system: S1 & S2 heard, RRR. No JVD. Gastrointestinal system: Abdomen is nondistended, soft and nontender.  Extremities: No pedal edema. Skin: No rashes, lesions or ulcers Psychiatry: Judgement and insight appear normal. Mood & affect appropriate. Data Reviewed:    Labs: Basic Metabolic Panel: Recent Labs  Lab 01/01/22 1900 01/01/22 1905 01/02/22 0516  NA 142 139  --   K 5.0 4.8  --   CL 107 110  --   CO2 13*  --   --   GLUCOSE 215* 211*  --   BUN 13 10  --   CREATININE 1.60* 1.30* 1.16*  CALCIUM 9.9  --   --     GFR Estimated Creatinine Clearance: 25.8 mL/min (A) (by C-G formula based on SCr of 1.16 mg/dL (H)). Liver Function Tests: Recent Labs  Lab  01/01/22 1900  AST 31  ALT 15  ALKPHOS 87  BILITOT 0.7  PROT 6.3*  ALBUMIN 3.3*    No results for input(s): "LIPASE", "AMYLASE" in the last 168 hours. Recent Labs  Lab 01/01/22 1950  AMMONIA 33    Coagulation profile Recent Labs  Lab 01/01/22 1900  INR 1.0    COVID-19 Labs  Recent Labs  01/02/22 1128  FERRITIN 55  LDH 154    Lab Results  Component Value Date   SARSCOV2NAA NEGATIVE 01/26/2021   SARSCOV2NAA NEGATIVE 09/19/2020   SARSCOV2NAA NEGATIVE 09/10/2020    CBC: Recent Labs  Lab 01/01/22 1900 01/01/22 1905 01/02/22 0516  WBC 8.0  --  4.7  NEUTROABS 3.0  --   --   HGB 13.2 13.9 11.2*  HCT 40.6 41.0 33.9*  MCV 104.6*  --  102.4*  PLT 262  --  83*    Cardiac Enzymes: No results for input(s): "CKTOTAL", "CKMB", "CKMBINDEX", "TROPONINI" in the last 168 hours. BNP (last 3 results) No results for input(s): "PROBNP" in the last 8760 hours. CBG: Recent Labs  Lab 01/01/22 1859 01/02/22 2150 01/03/22 0753 01/03/22 0856  GLUCAP 217* 80 67* 69*    D-Dimer: No results for input(s): "DDIMER" in the last 72 hours. Hgb A1c: Recent Labs    01/02/22 0516  HGBA1C 4.8    Lipid Profile: Recent Labs    01/02/22 0516  CHOL 167  HDL 82  LDLCALC 76  TRIG 45  CHOLHDL 2.0    Thyroid function studies: Recent Labs    01/01/22 1950  TSH 1.418    Anemia work up: Recent Labs    01/01/22 1950 01/02/22 1128  VITAMINB12 316  --   FOLATE  --  >40.0  FERRITIN  --  55  TIBC  --  227*  IRON  --  43  RETICCTPCT  --  1.8    Sepsis Labs: Recent Labs  Lab 01/01/22 1900 01/01/22 1950 01/01/22 2311 01/02/22 0516  WBC 8.0  --   --  4.7  LATICACIDVEN  --  4.1* 2.0* 1.6    Microbiology Recent Results (from the past 240 hour(s))  Culture, blood (routine x 2)     Status: None (Preliminary result)   Collection Time: 01/01/22  7:50 PM   Specimen: BLOOD RIGHT WRIST  Result Value Ref Range Status   Specimen Description BLOOD RIGHT WRIST   Final   Special Requests   Final    BOTTLES DRAWN AEROBIC AND ANAEROBIC Blood Culture results may not be optimal due to an inadequate volume of blood received in culture bottles   Culture   Final    NO GROWTH 2 DAYS Performed at [facility], [address] [place], [place] [address]    Report Status PENDING  Incomplete  Culture, blood (routine x 2)     Status: None (Preliminary result)   Collection Time: 01/01/22  8:05 PM   Specimen: BLOOD LEFT HAND  Result Value Ref Range Status   Specimen Description BLOOD LEFT HAND  Final   Special Requests   Final    BOTTLES DRAWN AEROBIC AND ANAEROBIC Blood Culture results may not be optimal due to an inadequate volume of blood received in culture bottles   Culture   Final    NO GROWTH 2 DAYS Performed at [facility], [address], [place], [place] [address]    Report Status PENDING  Incomplete  Urine Culture     Status: Abnormal   Collection Time: 01/02/22  1:20 AM   Specimen: In/Out Cath Urine  Result Value Ref Range Status   Specimen Description IN/OUT CATH URINE  Final   Special Requests   Final    NONE Performed at [facility], [address] [place], [place] [address]    Culture MULTIPLE SPECIES PRESENT, SUGGEST RECOLLECTION (A)  Final  Report Status 01/03/2022 FINAL  Final     Medications:    enoxaparin (LOVENOX) injection  30 mg Subcutaneous Q24H   insulin aspart  0-5 Units Subcutaneous QHS   insulin aspart  0-9 Units Subcutaneous TID WC   predniSONE  10 mg Oral Q breakfast   Continuous Infusions:  sodium chloride Stopped (01/02/22 0412)   ceFEPime (MAXIPIME) IV 2 g (01/02/22 2340)   vancomycin        LOS: 2 days   Charlynne Cousins  [facility]  01/03/2022, 9:46 AM

## 2022-01-03 NOTE — Progress Notes (Signed)
Pt has ripped out IV and got blood on the gown and sheets. Pt stated that the IV fell out and she did not pull it out. Pt has also taken telemetry back off and gown. RN will put mitts on pt. RN will continue to monitor pt.   Wanda Valdez

## 2022-01-03 NOTE — Progress Notes (Signed)
Pt woke up this am and stated she cannot see. Pt stated she can only see lights and shadows. RN messaged MD on call to make aware.   Foster Simpson Arlan Birks

## 2022-01-03 NOTE — Progress Notes (Signed)
Pt keeps getting confused and taking off telemetry, gown, and purewick. Pt is easily redirected, however she is very forgetful and keeps doing it again.   [name]

## 2022-01-03 NOTE — Progress Notes (Signed)
Subjective: Lying in bed with no complaints.   Objective: Current vital signs: BP (!) 148/86 (BP Location: Right Arm)   Pulse 62   Temp 97.6 F (36.4 C) (Oral)   Resp 18   Ht 4\' 10"  (1.473 m)   Wt 41.6 kg   SpO2 95%   BMI 19.17 kg/m  Vital signs in last 24 hours: Temp:  [97.2 F (36.2 C)-98.3 F (36.8 C)] 97.6 F (36.4 C) (11/04 0900) Pulse Rate:  [58-90] 62 (11/04 0900) Resp:  [14-28] 18 (11/04 0900) BP: (102-148)/(73-98) 148/86 (11/04 0900) SpO2:  [91 %-100 %] 95 % (11/04 0900)  Intake/Output from previous day: 11/03 0701 - 11/04 0700 In: 1620 [P.O.:120; I.V.:1400; IV Piggyback:100] Out: 200 [Urine:200] Intake/Output this shift: No intake/output data recorded. Nutritional status:  Diet Order             Diet heart healthy/carb modified Room service appropriate? Yes; Fluid consistency: Thin  Diet effective now                  HEENT: [place]/AT Lungs: Respirations unlabored Ext: Warm and well perfused  Neurologic Exam: Ment: Alert and oriented. Speech is normal with no expressive or receptive aphasia. No dysarthria.  CN: Fixates and tracks with full EOM but with saccadic quality of visual pursuits. Face symmetric. Phonation intact.  Motor: 4+/5 BUE and BLE Sensory: Intact to touch x 4 Cerebellar: No ataxia with FNF bilaterally.   Lab Results: Results for orders placed or performed during the hospital encounter of 01/01/22 (from the past 48 hour(s))  CBG monitoring, ED     Status: Abnormal   Collection Time: 01/01/22  6:59 PM  Result Value Ref Range   Glucose-Capillary 217 (H) 70 - 99 mg/dL    Comment: Glucose reference range applies only to samples taken after fasting for at least 8 hours.  Ethanol     Status: None   Collection Time: 01/01/22  7:00 PM  Result Value Ref Range   Alcohol, Ethyl (B) <10 <10 mg/dL    Comment: (NOTE) Lowest detectable limit for serum alcohol is 10 mg/dL.  For medical purposes only. Performed at [facility] Lab, [address], [place], [place]   Protime-INR     Status: None   Collection Time: 01/01/22  7:00 PM  Result Value Ref Range   Prothrombin Time 13.3 11.4 - 15.2 seconds   INR 1.0 0.8 - 1.2    Comment: (NOTE) INR goal varies based on device and disease states. Performed at [facility], [address] [place], [place]   APTT     Status: None   Collection Time: 01/01/22  7:00 PM  Result Value Ref Range   aPTT 29 24 - 36 seconds    Comment: Performed at [facility] Lab, [address] [place], [place]  CBC     Status: Abnormal   Collection Time: 01/01/22  7:00 PM  Result Value Ref Range   WBC 8.0 4.0 - 10.5 K/uL   RBC 3.88 3.87 - 5.11 MIL/uL   Hemoglobin 13.2 12.0 - 15.0 g/dL   HCT 13/02/23 21.3 - 08.6 %   MCV 104.6 (H) 80.0 - 100.0 fL   MCH 34.0 26.0 - 34.0 pg   MCHC 32.5 30.0 - 36.0 g/dL   RDW 57.8 46.9 - 62.9 %   Platelets 262 150 - 400 K/uL   nRBC 0.0 0.0 - 0.2 %    Comment: Performed at  [facility] Lab, [address] [place] [place], [place] [address]  Differential     Status: Abnormal   Collection Time: 01/01/22  7:00 PM  Result Value Ref Range   Neutrophils Relative % 37 %   Neutro Abs 3.0 1.7 - 7.7 K/uL   Lymphocytes Relative 41 %   Lymphs Abs 3.2 0.7 - 4.0 K/uL   Monocytes Relative 12 %   Monocytes Absolute 1.0 0.1 - 1.0 K/uL   Eosinophils Relative 9 %   Eosinophils Absolute 0.8 (H) 0.0 - 0.5 K/uL   Basophils Relative 1 %   Basophils Absolute 0.1 0.0 - 0.1 K/uL   Immature Granulocytes 0 %   Abs Immature Granulocytes 0.01 0.00 - 0.07 K/uL    Comment: Performed at [facility], [address] [place], [place] [address]  Comprehensive metabolic panel     Status: Abnormal   Collection Time: 01/01/22  7:00 PM  Result Value Ref Range   Sodium 142 135 - 145 mmol/L   Potassium 5.0 3.5 - 5.1 mmol/L   Chloride 107 98 - 111 mmol/L   CO2 13 (L) 22 - 32 mmol/L   Glucose, Bld 215 (H) 70 - 99 mg/dL    Comment: Glucose reference range  applies only to samples taken after fasting for at least 8 hours.   BUN 13 8 - 23 mg/dL   Creatinine, Ser 2.72 (H) 0.44 - 1.00 mg/dL   Calcium 9.9 8.9 - 53.6 mg/dL   Total Protein 6.3 (L) 6.5 - 8.1 g/dL   Albumin 3.3 (L) 3.5 - 5.0 g/dL   AST 31 15 - 41 U/L   ALT 15 0 - 44 U/L   Alkaline Phosphatase 87 38 - 126 U/L   Total Bilirubin 0.7 0.3 - 1.2 mg/dL   GFR, Estimated 33 (L) >60 mL/min    Comment: (NOTE) Calculated using the CKD-EPI Creatinine Equation (2021)    Anion gap 22 (H) 5 - 15    Comment: Electrolytes repeated to confirm. Performed at [facility], [address] [place], [place] [address]   I-stat chem 8, ED     Status: Abnormal   Collection Time: 01/01/22  7:05 PM  Result Value Ref Range   Sodium 139 135 - 145 mmol/L   Potassium 4.8 3.5 - 5.1 mmol/L   Chloride 110 98 - 111 mmol/L   BUN 10 8 - 23 mg/dL   Creatinine, Ser 4.74 (H) 0.44 - 1.00 mg/dL   Glucose, Bld 259 (H) 70 - 99 mg/dL    Comment: Glucose reference range applies only to samples taken after fasting for at least 8 hours.   Calcium, Ion 1.11 (L) 1.15 - 1.40 mmol/L   TCO2 14 (L) 22 - 32 mmol/L   Hemoglobin 13.9 12.0 - 15.0 g/dL   HCT 56.3 87.5 - 64.3 %  Vitamin B12     Status: None   Collection Time: 01/01/22  7:50 PM  Result Value Ref Range   Vitamin B-12 316 180 - 914 pg/mL    Comment: (NOTE) This assay is not validated for testing neonatal or myeloproliferative syndrome specimens for Vitamin B12 levels. Performed at [facility] Lab, [address], [place], [place] [address]   TSH     Status: None   Collection Time: 01/01/22  7:50 PM  Result Value Ref Range   TSH 1.418 0.350 - 4.500 uIU/mL    Comment: Performed by a 3rd Generation assay with a functional sensitivity of <=0.01  uIU/mL. Performed at [facility], [address] [place], [place] [address]   Ammonia     Status: None   Collection Time: 01/01/22  7:50 PM  Result Value Ref Range   Ammonia 33 9 - 35 umol/L     Comment: Performed at [facility], [address], [place], [place] [address]  Lactic acid, plasma     Status: Abnormal   Collection Time: 01/01/22  7:50 PM  Result Value Ref Range   Lactic Acid, Venous 4.1 (HH) 0.5 - 1.9 mmol/L    Comment: CRITICAL RESULT CALLED TO, READ BACK BY AND VERIFIED WITH Nicolasa Ducking, RN AT 2120 O N 01/01/22 BY H.HOWARD. Performed at [facility] Lab, [address] [place], [place] [address]   Culture, blood (routine x 2)     Status: None (Preliminary result)   Collection Time: 01/01/22  7:50 PM   Specimen: BLOOD RIGHT WRIST  Result Value Ref Range   Specimen Description BLOOD RIGHT WRIST    Special Requests      BOTTLES DRAWN AEROBIC AND ANAEROBIC Blood Culture results may not be optimal due to an inadequate volume of blood received in culture bottles   Culture      NO GROWTH 2 DAYS Performed at [facility], [address], [place], [place] [address]    Report Status PENDING   Culture, blood (routine x 2)     Status: None (Preliminary result)   Collection Time: 01/01/22  8:05 PM   Specimen: BLOOD LEFT HAND  Result Value Ref Range   Specimen Description BLOOD LEFT HAND    Special Requests      BOTTLES DRAWN AEROBIC AND ANAEROBIC Blood Culture results may not be optimal due to an inadequate volume of blood received in culture bottles   Culture      NO GROWTH 2 DAYS Performed at [facility], [address] [place], [place] [address]    Report Status PENDING   Lactic acid, plasma     Status: Abnormal   Collection Time: 01/01/22 11:11 PM  Result Value Ref Range   Lactic Acid, Venous 2.0 (HH) 0.5 - 1.9 mmol/L    Comment: CRITICAL VALUE NOTED. VALUE IS CONSISTENT WITH PREVIOUSLY REPORTED/CALLED VALUE Performed at [facility], [address] [place], [place] [address]   Urine rapid drug screen (hosp performed)     Status: None   Collection Time: 01/02/22  1:20 AM  Result Value Ref Range   Opiates NONE DETECTED NONE DETECTED    Cocaine NONE DETECTED NONE DETECTED   Benzodiazepines NONE DETECTED NONE DETECTED   Amphetamines NONE DETECTED NONE DETECTED   Tetrahydrocannabinol NONE DETECTED NONE DETECTED   Barbiturates NONE DETECTED NONE DETECTED    Comment: (NOTE) DRUG SCREEN FOR MEDICAL PURPOSES ONLY.  IF CONFIRMATION IS NEEDED FOR ANY PURPOSE, NOTIFY LAB WITHIN 5 DAYS.  LOWEST DETECTABLE LIMITS FOR URINE DRUG SCREEN Drug Class                     Cutoff (ng/mL) Amphetamine and metabolites    1000 Barbiturate and metabolites    200 Benzodiazepine                 200 Opiates and metabolites        300 Cocaine and metabolites        300 THC  50 Performed at [facility], [address] [place], [place] [address]   Urinalysis, Routine w reflex microscopic     Status: Abnormal   Collection Time: 01/02/22  1:20 AM  Result Value Ref Range   Color, Urine STRAW (A) YELLOW   APPearance HAZY (A) CLEAR   Specific Gravity, Urine 1.017 1.005 - 1.030   pH 8.0 5.0 - 8.0   Glucose, UA NEGATIVE NEGATIVE mg/dL   Hgb urine dipstick NEGATIVE NEGATIVE   Bilirubin Urine NEGATIVE NEGATIVE   Ketones, ur 5 (A) NEGATIVE mg/dL   Protein, ur NEGATIVE NEGATIVE mg/dL   Nitrite NEGATIVE NEGATIVE   Leukocytes,Ua NEGATIVE NEGATIVE   RBC / HPF 0-5 0 - 5 RBC/hpf   WBC, UA 0-5 0 - 5 WBC/hpf   Bacteria, UA RARE (A) NONE SEEN   Squamous Epithelial / LPF 6-10 0 - 5   Mucus PRESENT     Comment: Performed at [facility], [address] [place], [place] [address]  Urine Culture     Status: Abnormal   Collection Time: 01/02/22  1:20 AM   Specimen: In/Out Cath Urine  Result Value Ref Range   Specimen Description IN/OUT CATH URINE    Special Requests      NONE Performed at [facility], [address], [place], [place] [address]    Culture MULTIPLE SPECIES PRESENT, SUGGEST RECOLLECTION (A)    Report Status 01/03/2022 FINAL   Lactic acid, plasma     Status: None   Collection Time:  01/02/22  5:16 AM  Result Value Ref Range   Lactic Acid, Venous 1.6 0.5 - 1.9 mmol/L    Comment: Performed at [facility], [address], [place], [place] [address]  Lipid panel     Status: None   Collection Time: 01/02/22  5:16 AM  Result Value Ref Range   Cholesterol 167 0 - 200 mg/dL   Triglycerides 45 <659 mg/dL   HDL 82 >93 mg/dL   Total CHOL/HDL Ratio 2.0 RATIO   VLDL 9 0 - 40 mg/dL   LDL Cholesterol 76 0 - 99 mg/dL    Comment:        Total Cholesterol/HDL:CHD Risk Coronary Heart Disease Risk Table                     Men   Women  1/2 Average Risk   3.4   3.3  Average Risk       5.0   4.4  2 X Average Risk   9.6   7.1  3 X Average Risk  23.4   11.0        Use the calculated Patient Ratio above and the CHD Risk Table to determine the patient's CHD Risk.        ATP III CLASSIFICATION (LDL):  <100     mg/dL   Optimal  570-177  mg/dL   Near or Above                    Optimal  130-159  mg/dL   Borderline  939-030  mg/dL   High  >092     mg/dL   Very High Performed at [facility], [address] [place], [place] [address]   Hemoglobin A1c     Status: None   Collection Time: 01/02/22  5:16 AM  Result Value Ref Range   Hgb A1c MFr Bld 4.8 4.8 - 5.6 %  Comment: (NOTE) Pre diabetes:          5.7%-6.4%  Diabetes:              >6.4%  Glycemic control for   <7.0% adults with diabetes    Mean Plasma Glucose 91.06 mg/dL    Comment: Performed at [facility], [address], [place], [place] [address]  CBC     Status: Abnormal   Collection Time: 01/02/22  5:16 AM  Result Value Ref Range   WBC 4.7 4.0 - 10.5 K/uL   RBC 3.31 (L) 3.87 - 5.11 MIL/uL   Hemoglobin 11.2 (L) 12.0 - 15.0 g/dL   HCT 60.4 (L) 54.0 - 98.1 %   MCV 102.4 (H) 80.0 - 100.0 fL   MCH 33.8 26.0 - 34.0 pg   MCHC 33.0 30.0 - 36.0 g/dL   RDW 19.1 47.8 - 29.5 %   Platelets 83 (L) 150 - 400 K/uL    Comment: Immature Platelet Fraction may be clinically indicated, consider ordering  this additional test [id number] REPEATED TO VERIFY PLATELET COUNT CONFIRMED BY SMEAR    nRBC 0.0 0.0 - 0.2 %    Comment: Performed at [facility] Lab, [address] [place], [place] [address]  Creatinine, serum     Status: Abnormal   Collection Time: 01/02/22  5:16 AM  Result Value Ref Range   Creatinine, Ser 1.16 (H) 0.44 - 1.00 mg/dL   GFR, Estimated 48 (L) >60 mL/min    Comment: (NOTE) Calculated using the CKD-EPI Creatinine Equation (2021) Performed at [facility], [address] [place], [place] [address]   Lactate dehydrogenase     Status: None   Collection Time: 01/02/22 11:28 AM  Result Value Ref Range   LDH 154 98 - 192 U/L    Comment: Performed at [facility], [address] [place], [place] [address]  Technologist smear review     Status: None   Collection Time: 01/02/22 11:28 AM  Result Value Ref Range   WBC MORPHOLOGY MORPHOLOGY UNREMARKABLE    RBC MORPHOLOGY MORPHOLOGY UNREMARKABLE    Plt Morphology MORPHOLOGY UNREMARKABLE    Clinical Information sepsis thromocytopenia.     Comment: Performed at [facility], [address] [place], [place] [address]  Folate     Status: None   Collection Time: 01/02/22 11:28 AM  Result Value Ref Range   Folate >40.0 >5.9 ng/mL    Comment: Performed at [facility], [address] [place], [place] [address]  Iron and TIBC     Status: Abnormal   Collection Time: 01/02/22 11:28 AM  Result Value Ref Range   Iron 43 28 - 170 ug/dL   TIBC 664 (L) 403 - 474 ug/dL   Saturation Ratios 19 10.4 - 31.8 %   UIBC 184 ug/dL    Comment: Performed at [facility] Lab, [address] [place] [address], [place], [place] [address]  Ferritin     Status: None   Collection Time: 01/02/22 11:28 AM  Result Value Ref Range   Ferritin 55 11 - 307 ng/mL    Comment: Performed at [facility], [address] [place], [place] [address]  Reticulocytes     Status: Abnormal   Collection Time: 01/02/22 11:28 AM  Result Value Ref  Range   Retic Ct Pct 1.8 0.4 - 3.1 %   RBC. 3.23 (L) 3.87 - 5.11 MIL/uL   Retic Count, Absolute 56.8 19.0 -  186.0 K/uL   Immature Retic Fract 13.5 2.3 - 15.9 %    Comment: Performed at [facility], [place] [address] [place], [place] [address]  Glucose, capillary     Status: None   Collection Time: 01/02/22  9:50 PM  Result Value Ref Range   Glucose-Capillary 80 70 - 99 mg/dL    Comment: Glucose reference range applies only to samples taken after fasting for at least 8 hours.  Glucose, capillary     Status: Abnormal   Collection Time: 01/03/22  7:53 AM  Result Value Ref Range   Glucose-Capillary 67 (L) 70 - 99 mg/dL    Comment: Glucose reference range applies only to samples taken after fasting for at least 8 hours.  Glucose, capillary     Status: Abnormal   Collection Time: 01/03/22  8:56 AM  Result Value Ref Range   Glucose-Capillary 69 (L) 70 - 99 mg/dL    Comment: Glucose reference range applies only to samples taken after fasting for at least 8 hours.    Recent Results (from the past 240 hour(s))  Culture, blood (routine x 2)     Status: None (Preliminary result)   Collection Time: 01/01/22  7:50 PM   Specimen: BLOOD RIGHT WRIST  Result Value Ref Range Status   Specimen Description BLOOD RIGHT WRIST  Final   Special Requests   Final    BOTTLES DRAWN AEROBIC AND ANAEROBIC Blood Culture results may not be optimal due to an inadequate volume of blood received in culture bottles   Culture   Final    NO GROWTH 2 DAYS Performed at [facility], [place] [address] [place], [place] [address]    Report Status PENDING  Incomplete  Culture, blood (routine x 2)     Status: None (Preliminary result)   Collection Time: 01/01/22  8:05 PM   Specimen: BLOOD LEFT HAND  Result Value Ref Range Status   Specimen Description BLOOD LEFT HAND  Final   Special Requests   Final    BOTTLES DRAWN AEROBIC AND ANAEROBIC Blood Culture results may not be optimal due to an inadequate volume of  blood received in culture bottles   Culture   Final    NO GROWTH 2 DAYS Performed at [facility], [place] [address] [place], [place] [address]    Report Status PENDING  Incomplete  Urine Culture     Status: Abnormal   Collection Time: 01/02/22  1:20 AM   Specimen: In/Out Cath Urine  Result Value Ref Range Status   Specimen Description IN/OUT CATH URINE  Final   Special Requests   Final    NONE Performed at [facility], [place] [address] [place], [place] [address]    Culture MULTIPLE SPECIES PRESENT, SUGGEST RECOLLECTION (A)  Final   Report Status 01/03/2022 FINAL  Final    Lipid Panel Recent Labs    01/02/22 0516  CHOL 167  TRIG 45  HDL 82  CHOLHDL 2.0  VLDL 9  LDLCALC 76    Studies/Results: ECHOCARDIOGRAM COMPLETE  Result Date: 01/02/2022    ECHOCARDIOGRAM REPORT   Patient Name:   Wanda Valdez Date of Exam: 01/02/2022 Medical Rec #:  [id number]          Height:       58.0 in Accession #:    [id number]         Weight:       91.7 lb Date of Birth:  [birth year]  BSA:          1.307 m Patient Age:    76 years           BP:           126/90 mmHg Patient Gender: F                  HR:           62 bpm. Exam Location:  Inpatient Procedure: 2D Echo, Cardiac Doppler and Color Doppler Indications:    Stroke  History:        Patient has prior history of Echocardiogram examinations, most                 recent 09/20/2020. Risk Factors:Hypertension.  Sonographer:    Gaynell Face Referring Phys: [id number] ARSHAD N KAKRAKANDY IMPRESSIONS  1. Left ventricular ejection fraction, by estimation, is 60 to 65%. The left ventricle has normal function. The left ventricle has no regional wall motion abnormalities. Left ventricular diastolic parameters are consistent with Grade I diastolic dysfunction (impaired relaxation).  2. Right ventricular systolic function is normal. The right ventricular size is normal.  3. Left atrial size was moderately dilated.  4. The mitral valve is normal in  structure. Trivial mitral valve regurgitation. No evidence of mitral stenosis.  5. The aortic valve was not well visualized. Aortic valve regurgitation is not visualized. No aortic stenosis is present.  6. Pulmonic valve regurgitation not well visualized. FINDINGS  Left Ventricle: Left ventricular ejection fraction, by estimation, is 60 to 65%. The left ventricle has normal function. The left ventricle has no regional wall motion abnormalities. The left ventricular internal cavity size was normal in size. There is  no left ventricular hypertrophy. Left ventricular diastolic parameters are consistent with Grade I diastolic dysfunction (impaired relaxation). Right Ventricle: The right ventricular size is normal. Right ventricular systolic function is normal. Left Atrium: Left atrial size was moderately dilated. Right Atrium: Right atrial size was normal in size. Pericardium: There is no evidence of pericardial effusion. Mitral Valve: The mitral valve is normal in structure. Mild mitral annular calcification. Trivial mitral valve regurgitation. No evidence of mitral valve stenosis. Tricuspid Valve: The tricuspid valve is normal in structure. Tricuspid valve regurgitation is trivial. No evidence of tricuspid stenosis. Aortic Valve: The aortic valve was not well visualized. Aortic valve regurgitation is not visualized. No aortic stenosis is present. Aortic valve mean gradient measures 3.0 mmHg. Aortic valve peak gradient measures 5.1 mmHg. Pulmonic Valve: The pulmonic valve was not well visualized. Pulmonic valve regurgitation not well visualized. Aorta: The aortic root is normal in size and structure. Venous: The inferior vena cava was not well visualized. IAS/Shunts: No atrial level shunt detected by color flow Doppler.  LEFT VENTRICLE PLAX 2D LVIDd:         3.90 cm   Diastology LVIDs:         2.60 cm   LV e' medial:    5.59 cm/s LV PW:         1.00 cm   LV E/e' medial:  12.5 LV IVS:        1.00 cm   LV e' lateral:    8.70 cm/s LVOT diam:     2.00 cm   LV E/e' lateral: 8.0 LVOT Area:     3.14 cm  RIGHT VENTRICLE TAPSE (M-mode): 1.7 cm LEFT ATRIUM             Index  RIGHT ATRIUM          Index LA Vol (A2C):   57.0 ml 43.60 ml/m  RA Area:     8.38 cm LA Vol (A4C):   57.6 ml 44.05 ml/m  RA Volume:   13.40 ml 10.25 ml/m LA Biplane Vol: 59.8 ml 45.74 ml/m  AORTIC VALVE AV Vmax:      113.00 cm/s AV Vmean:     77.200 cm/s AV VTI:       0.260 m AV Peak Grad: 5.1 mmHg AV Mean Grad: 3.0 mmHg  AORTA Ao Root diam: 3.20 cm MITRAL VALVE MV Area (PHT): 3.03 cm    SHUNTS MV Decel Time: 250 msec    Systemic Diam: 2.00 cm MV E velocity: 69.60 cm/s MV A velocity: 89.20 cm/s MV E/A ratio:  0.78 Olga MillersBrian Crenshaw MD Electronically signed by Olga MillersBrian Crenshaw MD Signature Date/Time: 01/02/2022/1:12:09 PM    Final    EEG adult  Result Date: 01/02/2022 Charlsie QuestYadav, Priyanka O, MD     01/02/2022  9:17 AM Patient Name: Wanda RuaBarbara Valdez MRN: [id number] Epilepsy Attending: Charlsie QuestPriyanka O Yadav Referring Physician/Provider: Milon DikesArora, Ashish, MD Date: 01/01/2022 Duration: 23.14 mins Patient history: 76 year old female with jerking episodes.  EEG to evaluate for seizure. Level of alertness: Awake AEDs during EEG study: Ativan, LEV Technical aspects: This EEG study was done with scalp electrodes positioned according to the 10-20 International system of electrode placement. Electrical activity was reviewed with band pass filter of 1-70Hz , sensitivity of 7 uV/mm, display speed of 7130mm/sec with a 60Hz  notched filter applied as appropriate. EEG data were recorded continuously and digitally stored.  Video monitoring was available and reviewed as appropriate. Description: The posterior dominant rhythm consists of 7.5 Hz activity of moderate voltage (25-35 uV) seen predominantly in posterior head regions, symmetric and reactive to eye opening and eye closing.  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  Additionally there is cortical dysfunction in right hemisphere likely secondary to underlying structural abnormality, postictal state.  No seizures were seen during the study. Charlsie Questriyanka O Yadav   CT RENAL STONE STUDY  Result Date: 01/02/2022 CLINICAL DATA:  Flank pain.  Kidney stone suspected. EXAM: CT ABDOMEN AND PELVIS WITHOUT CONTRAST TECHNIQUE: Multidetector CT imaging of the abdomen and pelvis was performed following the standard protocol without IV contrast. RADIATION DOSE REDUCTION: This exam was performed according to the departmental dose-optimization program which includes automated exposure control, adjustment of the mA and/or kV according to patient size and/or use of iterative reconstruction technique. COMPARISON:  CTA chest, abdomen and pelvis 09/19/2020. FINDINGS: Lower chest: There are small layering pleural effusions and posterior basal linear atelectasis. Lung bases are otherwise clear. There is mild cardiomegaly. No pericardial effusion. There are coronary artery calcifications, tortuosity and heavy calcification of the descending thoracic aorta. Hepatobiliary: Unremarkable unenhanced liver parenchyma. Surgically absent gallbladder as before. No biliary prominence. Pancreas: Mildly prominent pancreatic duct in the head, neck and body segments, unchanged. No other significant findings in the unenhanced pancreas. Spleen: Unremarkable without contrast. Adrenals/Urinary Tract: There is no adrenal mass. There is a 3.4 cm homogeneous thin walled cyst of the upper pole of the left kidney of 15.6 Hounsfield units. No follow-up is recommended. There are additional subcentimeter too small  to characterize hypodensities in both kidneys which are  also unchanged. Both kidneys show mild atrophic change. Bilateral perinephric stranding is similar. There is no asymmetric or contrast retention bilaterally but there is mild hydroureteronephrosis on the left. There is no appreciable filling defect but a small stone would be missed due to dense contrast in the collecting systems, ureters and bladder. The right renal collecting system and ureter are decompressed. The bladder is unremarkable. Stomach/Bowel: Unremarkable unopacified stomach and small bowel. An appendix is not seen in this patient. There is wall prominence versus nondistention in the ascending and proximal transverse colon. Scattered diverticulosis with the remainder of the large bowel wall is normal thickness. Vascular/Lymphatic: There is extensive aortoiliac and branch vessel atherosclerotic versus. No AAA. No adenopathy. Reproductive: Status post hysterectomy. No adnexal masses. There are numerous pelvic phleboliths. Other: Limited exam due to metallic artifact from multiple overlying wires and due to beam hardening from the patient's right arm in the field. There is trace ascites in the presacral space. There is no free air, free hemorrhage or free fluid Musculoskeletal: Osteopenia, moderate thoracolumbar dextroscoliosis, and degenerative changes. There is advanced facet hypertrophy at L5-S1 with grade 1 degenerative anterolisthesis. No worrisome regional bone lesion. IMPRESSION: 1. Mild left hydroureteronephrosis but no appreciable stone or filling defect is seen. Findings could be due to a recently passed stone or ascending UTI. 2. A small obstructing stone could be obscured due to dense contrast in the collecting systems, ureters and bladder. The patient had a CTA head yesterday. 3. Bilateral mild renal atrophy with chronic perinephric stranding. 4. Ascending and proximal transverse colitis versus nondistention. 5. Diverticulosis. 6. Aortic and coronary artery atherosclerosis. Cardiomegaly. 7.  Small pleural effusions. 8. Trace presacral ascites. 9. Osteopenia, scoliosis and degenerative change. Aortic Atherosclerosis (ICD10-I70.0). Electronically Signed   By: Almira Bar M.D.   On: 01/02/2022 04:16   CT ANGIO HEAD NECK W WO CM W PERF (CODE STROKE)  Result Date: 01/01/2022 CLINICAL DATA:  Initial evaluation for neuro deficit, stroke suspected. EXAM: CT ANGIOGRAPHY HEAD AND NECK CT PERFUSION BRAIN TECHNIQUE: Multidetector CT imaging of the head and neck was performed using the standard protocol during bolus administration of intravenous contrast. Multiplanar CT image reconstructions and MIPs were obtained to evaluate the vascular anatomy. Carotid stenosis measurements (when applicable) are obtained utilizing NASCET criteria, using the distal internal carotid diameter as the denominator. Multiphase CT imaging of the brain was performed following IV bolus contrast injection. Subsequent parametric perfusion maps were calculated using RAPID software. RADIATION DOSE REDUCTION: This exam was performed according to the departmental dose-optimization program which includes automated exposure control, adjustment of the mA and/or kV according to patient size and/or use of iterative reconstruction technique. CONTRAST:  75mL OMNIPAQUE IOHEXOL 350 MG/ML SOLN COMPARISON:  Head CT from earlier the same day. FINDINGS: CTA NECK FINDINGS Aortic arch: Examination moderately to severely degraded by motion artifact. Aortic arch within normal limits for caliber. Moderate to advanced atheromatous change about the arch and origin of the great vessels. 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 Right carotid system: Right common and internal carotid arteries are tortuous without dissection or occlusion. Atheromatous change about the right carotid bulb without hemodynamically significant greater than 50% stenosis. Left carotid system: Left common and  internal carotid arteries are tortuous without dissection or occlusion. Atheromatous change about the left carotid bulb without hemodynamically significant greater than 50% stenosis. Vertebral arteries: Both vertebral arteries arise from  subclavian arteries. No other proximal subclavian artery stenosis. Vertebral arteries are tortuous without visible dissection, stenosis or occlusion. Skeleton: No visible discrete or worrisome osseous lesions. Exaggeration of the normal cervical lordosis noted. Other neck: No other visible soft tissue abnormality within the neck. Upper chest: Visualized upper chest demonstrates no acute finding. Review of the MIP images confirms the above findings CTA HEAD FINDINGS Anterior circulation: Evaluation of the intracranial circulation limited by motion artifact. /petrous segments patent bilaterally. Atheromatous change within the carotid siphons without definite high-grade stenosis. A1 segments patent. Grossly normal anterior communicating artery complex. Both anterior cerebral arteries grossly patent to their distal aspects without visible stenosis. M1 segments patent bilaterally. No visible proximal MCA branch occlusion. Distal MCA branches grossly perfused and symmetric. Posterior circulation: Both V4 segments patent without visible stenosis. Both PICA patent. Basilar grossly patent to its distal aspect without stenosis. Superior cerebral arteries grossly patent bilaterally. Both PCAs grossly patent to their distal aspects without visible stenosis or occlusion. Probable fetal type origin of the left PCA. Venous sinuses: Patent allowing for timing the contrast bolus and motion. Anatomic variants: As above. Review of the MIP images confirms the above findings CT Brain Perfusion Findings: ASPECTS: 10 CBF (<30%) Volume: 62mL Perfusion (Tmax>6.0s) volume: 51mL Mismatch Volume: 20mL Infarction Location:Negative CT perfusion for acute core infarct. 30 cc delayed perfusion within the right  cerebral hemisphere, watershed in distribution. 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 IMPRESSION: CTA HEAD AND NECK IMPRESSION: 1. Motion degraded exam. 2. Negative CTA, with no visible acute large vessel occlusion. 3. 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 4. Mild-to-moderate atheromatous change elsewhere about the major arterial vasculature of the head and neck. No other visible proximal high-grade or correctable stenosis. 5. Diffuse tortuosity of the major arterial vasculature of the head and neck, suggesting chronic underlying hypertension. CT PERFUSION IMPRESSION: 1. No evidence for acute core infarct. 2. 30 mL area of delayed perfusion within the right cerebral hemisphere, watershed in distribution. 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 Case discussed by telephone at the time of interpretation on 01/01/2022 at 7:25 p.m. with provider ASHISH ARORA. Electronically Signed   By: Rise Mu M.D.   On: 01/01/2022 20:21   DG Chest Port 1 View  Result Date: 01/01/2022 CLINICAL DATA:  Weakness EXAM: PORTABLE CHEST 1 VIEW COMPARISON:  10/15/2021 FINDINGS: Lungs are clear. No pneumothorax or pleural effusion. 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 Cardiac size is within normal limits. Pulmonary vascularity is normal. Right total shoulder arthroplasty has been performed. No acute bone abnormality. IMPRESSION: 1. No active disease. 2. 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 This could be better assessed with a standard two view chest radiograph or CT imaging if indicated. Electronically Signed   By: Helyn Numbers M.D.   On: 01/01/2022 19:53   CT HEAD CODE STROKE WO CONTRAST  Result  Date: 01/01/2022 CLINICAL DATA:  Code stroke. Initial evaluation for neuro deficit, headache with blurry vision. EXAM: CT HEAD WITHOUT CONTRAST TECHNIQUE: Contiguous axial images were obtained from the base of the skull through the vertex without intravenous contrast. RADIATION DOSE REDUCTION: This exam was performed according to the departmental dose-optimization program which includes automated exposure control, adjustment of the mA and/or kV according to  patient size and/or use of iterative reconstruction technique. COMPARISON:  None available. FINDINGS: Brain: Examination moderately degraded by motion artifact. No acute intracranial hemorrhage. No visible acute large vessel territory infarct. No mass lesion, midline shift or mass effect. No hydrocephalus. No visible extra-axial fluid collection. Vascular: No visible abnormal hyperdense vessel. Skull: Scalp soft tissues and calvarium within normal limits. Sinuses/Orbits: Globes orbital soft tissues demonstrate no acute finding. Mild left sphenoid sinus disease. Paranasal sinuses are otherwise clear. Other: No mastoid effusion. ASPECTS [facility] Stroke Program Early CT Score) - Ganglionic level infarction (caudate, lentiform nuclei, internal capsule, insula, M1-M3 cortex): 7 - Supraganglionic infarction (M4-M6 cortex): 3 Total score (0-10 with 10 being normal): 10 IMPRESSION: 1. Motion degraded exam. No definite acute intracranial abnormality. 2. ASPECTS is 10. These results were communicated to Dr. Wilford Corner at 7:18 pm on 01/01/2022 by text page via the [facility] messaging system. Electronically Signed   By: Rise Mu M.D.   On: 01/01/2022 19:21    Medications: Scheduled:  allopurinol  100 mg Oral Daily   atorvastatin  20 mg Oral Daily   cholestyramine  1 packet Oral BID   enoxaparin (LOVENOX) injection  30 mg Subcutaneous Q24H   lipase/protease/amylase  24,000 Units Oral TID AC   metoprolol tartrate  25 mg Oral Daily   pantoprazole  40 mg Oral Daily    predniSONE  10 mg Oral Q breakfast   sucralfate  1 g Oral TID   Continuous:  sodium chloride Stopped (01/02/22 0412)   ceFEPime (MAXIPIME) IV 2 g (01/02/22 2340)   vancomycin      Assessment: 76 year old presenting for evaluation of dizziness, blurred vision headache and generalized weakness.  Noted to be hypotensive on arrival and generally weak with noncontrasted head CT unremarkable for acute process but CT perfusion studies showing hypoperfusion in the right cerebral hemisphere in the setting of a significantly stenosed right innominate. Loaded with Keppra in the ED. Subsequent EEG reveals evidence for epileptogenicity.  - Exam today reveals intact speech with no bulbar weakness, focal motor deficit or ataxia.  - EEG: Sharp wave, right occipital region; continuous slow, right hemisphere. 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  Additionally there is cortical dysfunction in right hemisphere likely secondary to underlying structural abnormality, postictal state.  No seizures were seen during the study.  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 Mild-to-moderate atheromatous change elsewhere about the major arterial vasculature of the head and neck. No other visible proximal high-grade or correctable stenosis. Diffuse tortuosity of the major arterial vasculature of the head and neck, suggesting chronic underlying hypertension. - CTP: No evidence for acute core infarct. 30 mL area of delayed perfusion within the right cerebral hemisphere, watershed in distribution. 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 - DDx:  - Presentation may be due to hypoperfusion of the right cerebral hemisphere with watershed TIA in the context of the right sided delayed perfusion seen on CTP and the 80-90% stenosis at the origin of  the innominate artery.  - The right hemispheric EEG abnormalities may reflect sequelae of focal right hemisphere hypoperfusion in the setting of her hypotension with possible watershed TIA. However, seizure precipitating autonomic symptom of hypotension is also possible.      Recommendations: Starting scheduled Keppra at 500 mg PO BID. Recommend B12 supplementation for B12 level that is low by  neurological standards. Thiamine level ordered given her poor p.o. intake Sepsis work-up per primary team. 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   LOS: 2 days   @Electronically  signed: Dr. Caryl Pina 01/03/2022  10:13 AM

## 2022-01-03 NOTE — Evaluation (Signed)
Occupational Therapy Evaluation Patient Details Name: Wanda Valdez MRN: [id number] DOB: 11-03-1945 Today's Date: 01/03/2022   History of Present Illness Wanda Valdez is a 76 y.o. female who was found to be weak complaining of blurred vision and headache was brought to the ER; hypotension, shock (hypovolemic or septic), abnormal CT perfusion study concerning for possible stroke; ARF likely from diarrhea, thrombocytopenia, abdominal tenderness, R shoulder pain (had surgery within the past month); with history of lymphocytic colitis, hypertension who had a recent right shoulder surgery about a month ago   Clinical Impression   Pt presents with decline in function and safety with ADLs and ADL mobility with impaired strength, balance, endurance, safety awareness and R UE AROM/function. Pt providing conflicting info about PLOF.. R shoulder surgery, recently but uncertain of timeline of surgery and if any current limitations and restrictions, pt not wearing a sling and with very limited AROM. PTA pt lived at home with her daughter. Per pt, her daughter was assisting her with dressing only. Pt currently requires mod A with UB And LB ADLs, min guard A with mobility 1 person HHA and toilet transfers. Pt would benefit for acute OT services to maximize level of function and safety     Recommendations for follow up therapy are one component of a multi-disciplinary discharge planning process, led by the attending physician.  Recommendations may be updated based on patient status, additional functional criteria and insurance authorization.   Follow Up Recommendations  Home health OT    Assistance Recommended at Discharge Frequent or constant Supervision/Assistance  Patient can return home with the following A lot of help with bathing/dressing/bathroom;A little help with walking and/or transfers;Assistance with cooking/housework;Assist for transportation    Functional Status Assessment  Patient has  had a recent decline in their functional status and demonstrates the ability to make significant improvements in function in a reasonable and predictable amount of time.  Equipment Recommendations  Tub/shower bench    Recommendations for Other Services       Precautions / Restrictions Precautions Precautions: Fall;Shoulder Type of Shoulder Precautions: recent R shoulder surgery, no sling and unable to locate any precautions/restrictions. Pt states surgery was August 2023 although current chart says 1 month ago Restrictions Other Position/Activity Restrictions: Uncertain at this time, recent shoulder surgery August or Ocotber 2023??      Mobility Bed Mobility               General bed mobility comments: pt on BSC upoon arrival    Transfers Overall transfer level: Needs assistance Equipment used: 1 person hand held assist Transfers: Sit to/from Stand, Bed to chair/wheelchair/BSC Sit to Stand: Min guard                  Balance Overall balance assessment: Mild deficits observed, not formally tested                                         ADL either performed or assessed with clinical judgement   ADL Overall ADL's : Needs assistance/impaired Eating/Feeding: Set up;Sitting Eating/Feeding Details (indicate cue type and reason): set up due to R UE imparments Grooming: Wash/dry hands;Wash/dry face;Standing;Min guard Grooming Details (indicate cue type and reason): assist with balance and R UE hygiene Upper Body Bathing: Moderate assistance   Lower Body Bathing: Moderate assistance   Upper Body Dressing : Moderate assistance   Lower Body Dressing: Moderate assistance  Toilet Transfer: Min guard;Ambulation;Cueing for safety;BSC/3in1   Toileting- Clothing Manipulation and Hygiene: Minimal assistance;Sit to/from stand       Functional mobility during ADLs: Min guard       Vision Baseline Vision/History: 1 Wears glasses Ability to See in  Adequate Light: 0 Adequate Patient Visual Report: No change from baseline       Perception     Praxis      Pertinent Vitals/Pain Pain Assessment Pain Assessment: No/denies pain     Hand Dominance Right   Extremity/Trunk Assessment Upper Extremity Assessment Upper Extremity Assessment: Generalized weakness;RUE deficits/detail RUE Deficits / Details: R shoulder surgery, uncertain of timeline of surgery and if any current limitations and restrictions. pt not wearing a sling and with very limited AROM   Lower Extremity Assessment Lower Extremity Assessment: Defer to PT evaluation       Communication Communication Communication: No difficulties   Cognition Arousal/Alertness: Awake/alert Behavior During Therapy: WFL for tasks assessed/performed Overall Cognitive Status: History of cognitive impairments - at baseline                                 General Comments: pt providing conflicting info about PLOF     General Comments       Exercises     Shoulder Instructions      Home Living Family/patient expects to be discharged to:: Private residence Living Arrangements: Children   Type of Home: [place]: Laundry or work area in basement;Able to live on main level with bedroom/bathroom     Bathroom Shower/Tub: Teacher, early years/pre: Standard     Home Equipment: Radio producer - single point          Prior Functioning/Environment Prior Level of Function : Independent/Modified Independent             Mobility Comments: SPC intermittently ADLs Comments: pt states that her daughter has been helping her with dressing since September        OT Problem List: Decreased activity tolerance;Decreased strength;Decreased knowledge of use of DME or AE;Impaired UE functional use;Decreased safety awareness;Decreased coordination;Impaired balance (sitting and/or standing)      OT Treatment/Interventions: Self-care/ADL  training;Balance training;Therapeutic exercise;DME and/or AE instruction;Therapeutic activities;Patient/family education    OT Goals(Current goals can be found in the care plan section) Acute Rehab OT Goals Patient Stated Goal: go home OT Goal Formulation: With patient Time For Goal Achievement: 01/17/22 Potential to Achieve Goals: Good ADL Goals Pt Will Perform Grooming: with supervision;with set-up Pt Will Perform Upper Body Bathing: with min assist;with caregiver independent in assisting Pt Will Perform Lower Body Bathing: with min assist;with caregiver independent in assisting Pt Will Perform Upper Body Dressing: with min assist;with caregiver independent in assisting Pt Will Perform Lower Body Dressing: with min assist;with caregiver independent in assisting Pt Will Transfer to Toilet: with supervision;ambulating Pt Will Perform Toileting - Clothing Manipulation and hygiene: with supervision;sit to/from stand;with caregiver independent in assisting Pt Will Perform Tub/Shower Transfer: with min guard assist;with supervision;ambulating;tub bench  OT Frequency: Min 2X/week    Co-evaluation              AM-PAC OT "6 Clicks" Daily Activity     Outcome Measure Help from another person eating meals?: A Little Help from another person taking care of personal grooming?: A Little Help from another person toileting, which includes using toliet, bedpan, or urinal?: A  Little Help from another person bathing (including washing, rinsing, drying)?: A Lot Help from another person to put on and taking off regular upper body clothing?: A Lot Help from another person to put on and taking off regular lower body clothing?: A Lot 6 Click Score: 15   End of Session Equipment Utilized During Treatment: Gait belt  Activity Tolerance: Patient tolerated treatment well Patient left: in chair;with chair alarm set;with call bell/phone within reach  OT Visit Diagnosis: Unsteadiness on feet  (R26.81);Other abnormalities of gait and mobility (R26.89);Muscle weakness (generalized) (M62.81)                Time: 4403-4742 OT Time Calculation (min): 26 min Charges:  OT General Charges $OT Visit: 1 Visit OT Evaluation $OT Eval Moderate Complexity: 1 Mod OT Treatments $Self Care/Home Management : 8-22 mins    Galen Manila 01/03/2022, 1:36 PM

## 2022-01-04 ENCOUNTER — Inpatient Hospital Stay (HOSPITAL_COMMUNITY): Payer: Medicare Other

## 2022-01-04 DIAGNOSIS — R569 Unspecified convulsions: Secondary | ICD-10-CM | POA: Diagnosis not present

## 2022-01-04 LAB — VITAMIN B1: Vitamin B1 (Thiamine): 88.7 nmol/L (ref 66.5–200.0)

## 2022-01-04 LAB — GLUCOSE, CAPILLARY
Glucose-Capillary: 42 mg/dL — CL (ref 70–99)
Glucose-Capillary: 115 mg/dL — ABNORMAL HIGH (ref 70–99)
Glucose-Capillary: 128 mg/dL — ABNORMAL HIGH (ref 70–99)
Glucose-Capillary: 122 mg/dL — ABNORMAL HIGH (ref 70–99)
Glucose-Capillary: 107 mg/dL — ABNORMAL HIGH (ref 70–99)

## 2022-01-04 MED ORDER — DEXTROSE 5 % IV SOLN: INTRAVENOUS | Status: DC

## 2022-01-04 MED ORDER — HALOPERIDOL LACTATE 5 MG/ML IJ SOLN
1.0000 mg | Freq: Four times a day (QID) | INTRAMUSCULAR | Status: DC | PRN
  Administered 2022-01-07: 1 mg via INTRAVENOUS
  Filled 2022-01-04 (×2): qty 1

## 2022-01-04 MED ORDER — DEXTROSE 50 % IV SOLN
INTRAVENOUS | Status: AC
  Administered 2022-01-04: 50 mL
  Filled 2022-01-04: qty 50

## 2022-01-04 MED ORDER — LORAZEPAM 2 MG/ML IJ SOLN
1.0000 mg | Freq: Once | INTRAMUSCULAR | Status: DC
  Filled 2022-01-04 (×2): qty 1

## 2022-01-04 NOTE — Progress Notes (Signed)
Pt keeps getting out of bed, setting off bed alarm, to use the bathroom. Pt was also looking for her dog "Spike" and was calling for him. This RN was able to redirect pt and she is resting comfortably in the bed at this time. RN will continue to monitor pt.   Wanda Valdez

## 2022-01-04 NOTE — Progress Notes (Signed)
Pt CBG dropped to 42, gave Dextrose 50%  29ml checked again came up to 115. Pt is a picky eater.  Will continue to monitor.

## 2022-01-04 NOTE — Progress Notes (Signed)
Patient has been removing the cardiac monitoring, tried couple of times to put back but she removed it every time.  Will try to put it back later.

## 2022-01-04 NOTE — Progress Notes (Signed)
[facility] PROGRESS NOTE    Progress Note  [name]  BJY:[id number] DOB: Oct 08, 1945 DOA: 01/01/2022 PCP: Pcp, No     BrieLavonna Ruaf Narrative:   Wanda Valdez is an 76 y.o. female past medical history of lymphocytic colitis, essential hypertension with recent right shoulder surgery about a month ago was found to be weak complaining of blurry vision and headache, accompanied episode of diarrhea unable to get out of bed the day prior to admission comes into the ED code stroke was called as he was also found hypotensive in the ED fluid resuscitated started on Levophed, empiric antibiotics, CT of the head was unremarkable CT perfusion study showed hypoperfusion to the right cerebral hemisphere with a stenosis of the right innominate artery.   Assessment/Plan:   Circulatory shock: Required pressors temporarily  for couple of hours.  Of unclear etiology. Question due to seizures. Blood pressure has remained stable. Continue antibiotics for now. Urine culture inconclusive, blood cultures are negative till date. EEG showed possible seizures, will consult neurology.  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 EEG showed evidence of seizures neurology was consulted she was started on 8 AEDs MRI of the brain is pending.  Acute confusional state: She has no diagnosis of dementia spoken to her daughter her mentation fluctuates throughout the day. New Seroquel at night Haldol IV as needed. Try to minimize medication (once daily) at night. I told the daughter that a family member can be with her to orient to her that will help. She is at high risk of aspiration and agitation.  Acute kidney injury: With a baseline creatinine of less than 1 on admission 1.3 . Resolved with fluid resuscitation.  Thrombocytopenia: Normally she is greater than 150,  LDH 154. Resolved  Macrocytic  anemia: CBC shows a hemoglobin at baseline, anemia panel showed ferritin 55 will need further evaluation as an out patient on colonoscopy, will start on ferrous sulfate upon discharge. Folate of 40  Diffuse abdominal tenderness: CT of the abdomen pelvis CT of the abdomen showed mild left hydronephrosis no stones, mild bilateral renal atrophy with chronic perinephric stranding,  transverse colitis, small pleural effusions and trace ascites.  No acute abnormalities to explain her diffuse tenderness. Resume Creon. FOBT stools negative,.  Hyperglycemia: Likely reactive A1c of 4.8.  Discontinue all insulins.  Essential hypertension: Blood pressure is running up   DVT prophylaxis: Lovenox Family Communication:none Status is: Inpatient Remains inpatient appropriate because: Shock    Code Status:     Code Status Orders  (From admission, onward)           Start     Ordered   01/02/22 0310  Full code  Continuous        01/02/22 0310           Code Status History     Date Active Date Inactive Code Status Order ID Comments User Context   01/26/2021 1846 01/30/2021 2111 Full Code [id number]  [name], [name], MD Inpatient   09/19/2020 2142 09/22/2020 1733 Full Code [id number]  Hillary BowGardner, [name], DO ED   09/10/2020 1849 09/13/2020 2020 Full Code [id number]  [name], [name], MD Inpatient         IV Access:   Peripheral IV   Procedures and diagnostic studies:   ECHOCARDIOGRAM COMPLETE  Result Date: 01/02/2022    ECHOCARDIOGRAM REPORT   Patient Name:   Wanda RuaBARBARA Eynon Date of  Exam: 01/02/2022 Medical Rec #:  [id number]          Height:       58.0 in Accession #:    [id number]         Weight:       91.7 lb Date of Birth:  [birth year]          BSA:          1.307 m Patient Age:    76 years           BP:           126/90 mmHg Patient Gender: F                  HR:           62 bpm. Exam Location:  Inpatient Procedure: 2D Echo, Cardiac Doppler and Color Doppler Indications:     Stroke  History:        Patient has prior history of Echocardiogram examinations, most                 recent 09/20/2020. Risk Factors:Hypertension.  Sonographer:    Gaynell Face Referring Phys: [id number] ARSHAD N KAKRAKANDY IMPRESSIONS  1. Left ventricular ejection fraction, by estimation, is 60 to 65%. The left ventricle has normal function. The left ventricle has no regional wall motion abnormalities. Left ventricular diastolic parameters are consistent with Grade I diastolic dysfunction (impaired relaxation).  2. Right ventricular systolic function is normal. The right ventricular size is normal.  3. Left atrial size was moderately dilated.  4. The mitral valve is normal in structure. Trivial mitral valve regurgitation. No evidence of mitral stenosis.  5. The aortic valve was not well visualized. Aortic valve regurgitation is not visualized. No aortic stenosis is present.  6. Pulmonic valve regurgitation not well visualized. FINDINGS  Left Ventricle: Left ventricular ejection fraction, by estimation, is 60 to 65%. The left ventricle has normal function. The left ventricle has no regional wall motion abnormalities. The left ventricular internal cavity size was normal in size. There is  no left ventricular hypertrophy. Left ventricular diastolic parameters are consistent with Grade I diastolic dysfunction (impaired relaxation). Right Ventricle: The right ventricular size is normal. Right ventricular systolic function is normal. Left Atrium: Left atrial size was moderately dilated. Right [facility]: Right atrial size was normal in size. Pericardium: There is no evidence of pericardial effusion. Mitral Valve: The mitral valve is normal in structure. Mild mitral annular calcification. Trivial mitral valve regurgitation. No evidence of mitral valve stenosis. Tricuspid Valve: The tricuspid valve is normal in structure. Tricuspid valve regurgitation is trivial. No evidence of tricuspid stenosis. Aortic Valve: The aortic valve  was not well visualized. Aortic valve regurgitation is not visualized. No aortic stenosis is present. Aortic valve mean gradient measures 3.0 mmHg. Aortic valve peak gradient measures 5.1 mmHg. Pulmonic Valve: The pulmonic valve was not well visualized. Pulmonic valve regurgitation not well visualized. Aorta: The aortic root is normal in size and structure. Venous: The inferior vena cava was not well visualized. IAS/Shunts: No atrial level shunt detected by color flow Doppler.  LEFT VENTRICLE PLAX 2D LVIDd:         3.90 cm   Diastology LVIDs:         2.60 cm   LV e' medial:    5.59 cm/s LV PW:         1.00 cm   LV E/e' medial:  12.5 LV IVS:  1.00 cm   LV e' lateral:   8.70 cm/s LVOT diam:     2.00 cm   LV E/e' lateral: 8.0 LVOT Area:     3.14 cm  RIGHT VENTRICLE TAPSE (M-mode): 1.7 cm LEFT ATRIUM             Index        RIGHT ATRIUM          Index LA Vol (A2C):   57.0 ml 43.60 ml/m  RA Area:     8.38 cm LA Vol (A4C):   57.6 ml 44.05 ml/m  RA Volume:   13.40 ml 10.25 ml/m LA Biplane Vol: 59.8 ml 45.74 ml/m  AORTIC VALVE AV Vmax:      113.00 cm/s AV Vmean:     77.200 cm/s AV VTI:       0.260 m AV Peak Grad: 5.1 mmHg AV Mean Grad: 3.0 mmHg  AORTA Ao Root diam: 3.20 cm MITRAL VALVE MV Area (PHT): 3.03 cm    SHUNTS MV Decel Time: 250 msec    Systemic Diam: 2.00 cm MV E velocity: 69.60 cm/s MV A velocity: 89.20 cm/s MV E/A ratio:  0.78 Olga Millers MD Electronically signed by Olga Millers MD Signature Date/Time: 01/02/2022/1:12:09 PM    Final    EEG adult  Result Date: 01/02/2022 Charlsie Quest, MD     01/02/2022  9:17 AM Patient Name: Bailea Beed MRN: [id number] Epilepsy Attending: Charlsie Quest Referring Physician/Provider: Milon Dikes, MD Date: 01/01/2022 Duration: 23.14 mins Patient history: 76 year old female with jerking episodes.  EEG to evaluate for seizure. Level of alertness: Awake AEDs during EEG study: Ativan, LEV Technical aspects: This EEG study was done with scalp electrodes  positioned according to the 10-20 International system of electrode placement. Electrical activity was reviewed with band pass filter of 1-70Hz , sensitivity of 7 uV/mm, display speed of 3mm/sec with a 60Hz  notched filter applied as appropriate. EEG data were recorded continuously and digitally stored.  Video monitoring was available and reviewed as appropriate. Description: The posterior dominant rhythm consists of 7.5 Hz activity of moderate voltage (25-35 uV) seen predominantly in posterior head regions, symmetric and reactive to eye opening and eye closing.  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  Sharp waves were noted in right occipital region which at times appeared periodic with fluctuating frequency of 0.25 to 0.5 Hz ABNORMALITY -Sharp wave, right occipital region -Continuous slow, right hemisphere IMPRESSION: This study showed evidence of epileptogenicity arising from right occipital region.  Additionally there is cortical dysfunction in right hemisphere likely secondary to underlying structural abnormality, postictal state.  No seizures were seen during the study.     Medical Consultants:   None.   Subjective:    Maranda Talaga no complaints this morning  Objective:    Vitals:   01/03/22 0900 01/03/22 1620 01/03/22 2203 01/04/22 0451  BP: (!) 148/86 (!) 150/89 (!) 153/79 (!) 141/79  Pulse: 62 (!) 55 61 (!) 54  Resp: 18 18 18 16   Temp: 97.6 F (36.4 C) 98 F (36.7 C) 98.1 F (36.7 C) (!) 97.5 F (36.4 C)  TempSrc: Oral Oral Oral Oral  SpO2: 95%  100% 100%  Weight:      Height:       SpO2: 100 % O2 Flow Rate (L/min): 3 L/min   Intake/Output Summary (Last 24 hours) at 01/04/2022 0858 Last data filed at 01/04/2022 0200 Gross per 24 hour  Intake 120 ml  Output 200 ml  Net -80 ml    Filed Weights   01/01/22 1903  Weight: 41.6 kg    Exam: General exam: In no acute distress. Respiratory system: Good air movement and  clear to auscultation. Cardiovascular system: S1 & S2 heard, RRR. No JVD. Gastrointestinal system: Abdomen is nondistended, soft and nontender.  Extremities: No pedal edema. Skin: No rashes, lesions or ulcers Psychiatry: Judgement and insight appear normal. Mood & affect appropriate. Data Reviewed:    Labs: Basic Metabolic Panel: Recent Labs  Lab 01/01/22 1900 01/01/22 1905 01/02/22 0516 01/03/22 1032  NA 142 139  --  142  K 5.0 4.8  --  3.2*  CL 107 110  --  100  CO2 13*  --   --  27  GLUCOSE 215* 211*  --  107*  BUN 13 10  --  14  CREATININE 1.60* 1.30* 1.16* 1.20*  CALCIUM 9.9  --   --  8.7*    GFR Estimated Creatinine Clearance: 24.9 mL/min (A) (by C-G formula based on SCr of 1.2 mg/dL (H)). Liver Function Tests: Recent Labs  Lab 01/01/22 1900  AST 31  ALT 15  ALKPHOS 87  BILITOT 0.7  PROT 6.3*  ALBUMIN 3.3*    No results for input(s): "LIPASE", "AMYLASE" in the last 168 hours. Recent Labs  Lab 01/01/22 1950  AMMONIA 33    Coagulation profile Recent Labs  Lab 01/01/22 1900  INR 1.0    COVID-19 Labs  Recent Labs    01/02/22 1128  FERRITIN 55  LDH 154     Lab Results  Component Value Date   SARSCOV2NAA NEGATIVE 01/26/2021   SARSCOV2NAA NEGATIVE 09/19/2020   SARSCOV2NAA NEGATIVE 09/10/2020    CBC: Recent Labs  Lab 01/01/22 1900 01/01/22 1905 01/02/22 0516 01/03/22 1032  WBC 8.0  --  4.7 4.4  NEUTROABS 3.0  --   --  2.7  HGB 13.2 13.9 11.2* 11.6*  HCT 40.6 41.0 33.9* 35.1*  MCV 104.6*  --  102.4* 101.2*  PLT 262  --  83* 182    Cardiac Enzymes: No results for input(s): "CKTOTAL", "CKMB", "CKMBINDEX", "TROPONINI" in the last 168 hours. BNP (last 3 results) No results for input(s): "PROBNP" in the last 8760 hours. CBG: Recent Labs  Lab 01/03/22 1228 01/03/22 1530 01/03/22 2206 01/04/22 0729 01/04/22 0824  GLUCAP 106* 122* 103* 42* 115*    D-Dimer: No results for input(s): "DDIMER" in the last 72 hours. Hgb  A1c: Recent Labs    01/02/22 0516  HGBA1C 4.8    Lipid Profile: Recent Labs    01/02/22 0516  CHOL 167  HDL 82  LDLCALC 76  TRIG 45  CHOLHDL 2.0    Thyroid function studies: Recent Labs    01/01/22 1950  TSH 1.418    Anemia work up: Recent Labs    01/01/22 1950 01/02/22 1128  VITAMINB12 316  --   FOLATE  --  >40.0  FERRITIN  --  55  TIBC  --  227*  IRON  --  43  RETICCTPCT  --  1.8    Sepsis Labs: Recent Labs  Lab 01/01/22 1900 01/01/22 1950 01/01/22 2311 01/02/22 0516 01/03/22 1032  WBC 8.0  --   --  4.7 4.4  LATICACIDVEN  --  4.1* 2.0* 1.6  --     Microbiology Recent Results (from the past 240 hour(s))  Culture, blood (routine x 2)     Status: None (Preliminary result)   Collection Time:  01/01/22  7:50 PM   Specimen: BLOOD RIGHT WRIST  Result Value Ref Range Status   Specimen Description BLOOD RIGHT WRIST  Final   Special Requests   Final    BOTTLES DRAWN AEROBIC AND ANAEROBIC Blood Culture results may not be optimal due to an inadequate volume of blood received in culture bottles   Culture   Final    NO GROWTH 3 DAYS Performed at [facility], [place] [address] [place], [place] [address]    Report Status PENDING  Incomplete  Culture, blood (routine x 2)     Status: None (Preliminary result)   Collection Time: 01/01/22  8:05 PM   Specimen: BLOOD LEFT HAND  Result Value Ref Range Status   Specimen Description BLOOD LEFT HAND  Final   Special Requests   Final    BOTTLES DRAWN AEROBIC AND ANAEROBIC Blood Culture results may not be optimal due to an inadequate volume of blood received in culture bottles   Culture   Final    NO GROWTH 3 DAYS Performed at [facility], [place] [address], [place], [place] [address]    Report Status PENDING  Incomplete  Urine Culture     Status: Abnormal   Collection Time: 01/02/22  1:20 AM   Specimen: In/Out Cath Urine  Result Value Ref Range Status   Specimen Description IN/OUT CATH URINE  Final    Special Requests   Final    NONE Performed at [facility], [place] [address] [place], [place] [address]    Culture MULTIPLE SPECIES PRESENT, SUGGEST RECOLLECTION (A)  Final   Report Status 01/03/2022 FINAL  Final     Medications:    allopurinol  100 mg Oral Daily   atorvastatin  20 mg Oral Daily   cholestyramine  1 packet Oral BID   enoxaparin (LOVENOX) injection  30 mg Subcutaneous Q24H   levETIRAcetam  500 mg Oral BID   lipase/protease/amylase  24,000 Units Oral TID AC   LORazepam  1 mg Intravenous Once   metoprolol tartrate  25 mg Oral Daily   pantoprazole  40 mg Oral Daily   predniSONE  10 mg Oral Q breakfast   sucralfate  1 g Oral TID   Continuous Infusions:  sodium chloride Stopped (01/02/22 0412)   ceFEPime (MAXIPIME) IV 2 g (01/03/22 2129)   vancomycin 500 mg (01/03/22 2011)      LOS: 3 days   Charlynne Cousins  [facility]  01/04/2022, 8:58 AM

## 2022-01-04 NOTE — Evaluation (Signed)
Speech Language Pathology Evaluation Patient Details Name: Wanda Valdez MRN: [id number] DOB: 01/13/1946 Today's Date: 01/04/2022 Time: 0737-1062 SLP Time Calculation (min) (ACUTE ONLY): 25 min  Problem List:  Patient Active Problem List   Diagnosis Date Noted   Hypotension due to hypovolemia 01/02/2022   Acute cystitis without hematuria 01/02/2022   High anion gap metabolic acidosis 69/48/5462   Seizure ([name]) 01/02/2022   Shock ([name]) 01/02/2022   Sepsis ([name]) 01/01/2022   Hypovolemic shock ([name]) 01/26/2021   Syncope 09/19/2020   Current chronic use of systemic steroids 09/19/2020   Protein-calorie malnutrition, severe 09/12/2020   Hypokalemia 09/10/2020   Dysphagia 09/10/2020   Diarrhea 09/10/2020   Dehydration 09/10/2020   Transaminitis 09/10/2020   AKI (acute kidney injury) ([name]) 09/10/2020   Lymphocytic colitis 09/10/2020   Rib pain on right side 09/10/2020   SOB (shortness of breath)    Past Medical History:  Past Medical History:  Diagnosis Date   Hypertension    Past Surgical History:  Past Surgical History:  Procedure Laterality Date   ABDOMINAL HYSTERECTOMY     BIOPSY  09/11/2020   Procedure: BIOPSY;  Surgeon: [name], MD;  Location: [facility];  Service: Gastroenterology;;   BIOPSY  09/13/2020   Procedure: BIOPSY;  Surgeon: [name], MD;  Location: [facility];  Service: Gastroenterology;;   BIOPSY  01/29/2021   Procedure: BIOPSY;  Surgeon: [name], MD;  Location: [place];  Service: Endoscopy;;   COLONOSCOPY N/A 09/13/2020   Procedure: COLONOSCOPY;  Surgeon: [name], MD;  Location: [facility];  Service: Gastroenterology;  Laterality: N/A;   ESOPHAGOGASTRODUODENOSCOPY N/A 09/11/2020   Procedure: ESOPHAGOGASTRODUODENOSCOPY (EGD);  Surgeon: [name], MD;  Location: [facility];  Service: Gastroenterology;  Laterality: N/A;   ESOPHAGOGASTRODUODENOSCOPY (EGD) WITH PROPOFOL N/A 01/29/2021   Procedure:  ESOPHAGOGASTRODUODENOSCOPY (EGD) WITH PROPOFOL;  Surgeon: [name], MD;  Location: [place];  Service: Endoscopy;  Laterality: N/A;  With dilation as well and will need ultraslim endoscope in the room   POLYPECTOMY  09/13/2020   Procedure: POLYPECTOMY;  Surgeon: [name], MD;  Location: [facility];  Service: Gastroenterology;;   [name] DILATION N/A 01/29/2021   Procedure: [name] DILATION;  Surgeon: [name], MD;  Location: [facility];  Service: Endoscopy;  Laterality: N/A;   HPI:  Patient is a 76 y.o. female with PMH: lymphocytic colitis, HTN, recent right shoulder surgery(one month ago), esophageal stricture s/p EGD with dilation. She was found to be weak by family, complaining of blurred vision and headache and was brought to ER. She was found to be hypotensive, briefly started on Levophed and given fluid bolus and started on empiric antibiotics for features concerning for sepsis. Lab work also showed thrombocytopenia acute renal failure and hyperglycemia. 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   Assessment / Plan / Recommendation Clinical Impression  At time of this evaluation, patient not presenting with suspected significant change in her cognitive function. She was oriented to time, place, situation, telling SLP that she had and MRI today and was waiting to hear the results. She was able to describe recent medical and therapeutic interventions without difficulty. She acknowledged that "My daughter says I've been having hallucinations" and RN had informed SLP that patient appears fine cognitively during the day but in later afternoon and into night, patient has been having hallucinations, acting confused and agitated. Patient did acknowledge that she has been losing her appetite and she thinks her  vision loss has affected this as well because she can't really see what the food is on her  plate. Patient told SLP of her typical routine which involved her taking laundry downstairs to wash and bringing it back up one step at a time. She said her daughter has been doing her laundry currently. From what patient told SLP, it seemed she was not following precautions for her shoulder and she said that after carrying things, etc she would have increased shoulder pain. Patient does not have diagnosis of dementia and per MD note, daughter had reported that patient's mentation fluctuates during the day. SLP is recommending patient have 24/7 supervision upon discharge from the hospital as well as [facility] SLP for cognitive deficits.    SLP Assessment  SLP Recommendation/Assessment: All further Speech Lanaguage Pathology  needs can be addressed in the next venue of care SLP Visit Diagnosis: Cognitive communication deficit (R41.841)    Recommendations for follow up therapy are one component of a multi-disciplinary discharge planning process, led by the attending physician.  Recommendations may be updated based on patient status, additional functional criteria and insurance authorization.    Follow Up Recommendations  Home health SLP    Assistance Recommended at Discharge  Frequent or constant Supervision/Assistance  Functional Status Assessment Patient has had a recent decline in their functional status and demonstrates the ability to make significant improvements in function in a reasonable and predictable amount of time.  Frequency and Duration   N/A        SLP Evaluation Cognition  Overall Cognitive Status: No family/caregiver present to determine baseline cognitive functioning Arousal/Alertness: Awake/alert Orientation Level: Oriented to person;Oriented to place;Oriented to time;Oriented to situation Year: 2023 Month: November Day of Week: Correct Attention: Selective Selective Attention: Appears intact Memory: Appears intact Awareness: Impaired Awareness Impairment: Emergent  impairment;Anticipatory impairment Safety/Judgment: Impaired Comments: From what patient told SLP, it does not seem she was following precautions s/p her right shoulder surgery.       Comprehension  Auditory Comprehension Overall Auditory Comprehension: Appears within functional limits for tasks assessed    Expression Expression Primary Mode of Expression: Verbal Verbal Expression Overall Verbal Expression: Appears within functional limits for tasks assessed   Oral / Motor  Oral Motor/Sensory Function Overall Oral Motor/Sensory Function: Within functional limits Motor Speech Overall Motor Speech: Appears within functional limits for tasks assessed Respiration: Within functional limits Resonance: Within functional limits Articulation: Within functional limitis Intelligibility: Intelligible Motor Planning: Witnin functional limits           Angela Nevin, MA, CCC-SLP Speech Therapy

## 2022-01-05 ENCOUNTER — Encounter (HOSPITAL_COMMUNITY): Payer: Medicare Other

## 2022-01-05 ENCOUNTER — Inpatient Hospital Stay (HOSPITAL_COMMUNITY)
Admit: 2022-01-05 | Discharge: 2022-01-05 | Disposition: A | Discharge status: Home / Self Care | Payer: Medicare Other | Attending: Student in an Organized Health Care Education/Training Program | Admitting: Student in an Organized Health Care Education/Training Program

## 2022-01-05 ENCOUNTER — Inpatient Hospital Stay (HOSPITAL_COMMUNITY): Payer: Medicare Other

## 2022-01-05 DIAGNOSIS — R569 Unspecified convulsions: Secondary | ICD-10-CM | POA: Diagnosis not present

## 2022-01-05 DIAGNOSIS — I63411 Cerebral infarction due to embolism of right middle cerebral artery: Secondary | ICD-10-CM

## 2022-01-05 LAB — GLUCOSE, CAPILLARY
Glucose-Capillary: 59 mg/dL — ABNORMAL LOW (ref 70–99)
Glucose-Capillary: 77 mg/dL (ref 70–99)
Glucose-Capillary: 96 mg/dL (ref 70–99)
Glucose-Capillary: 154 mg/dL — ABNORMAL HIGH (ref 70–99)
Glucose-Capillary: 106 mg/dL — ABNORMAL HIGH (ref 70–99)

## 2022-01-05 LAB — BASIC METABOLIC PANEL
Anion gap: 10 (ref 5–15)
BUN: 8 mg/dL (ref 8–23)
CO2: 19 mmol/L — ABNORMAL LOW (ref 22–32)
Calcium: 8.9 mg/dL (ref 8.9–10.3)
Chloride: 110 mmol/L (ref 98–111)
Creatinine, Ser: 1.04 mg/dL — ABNORMAL HIGH (ref 0.44–1.00)
GFR, Estimated: 55 mL/min — ABNORMAL LOW (ref >60)
Glucose, Bld: 97 mg/dL (ref 70–99)
Potassium: 3.8 mmol/L (ref 3.5–5.1)
Sodium: 139 mmol/L (ref 135–145)

## 2022-01-05 MED ORDER — SODIUM CHLORIDE 0.9% FLUSH
10.0000 mL | Freq: Two times a day (BID) | INTRAVENOUS | Status: DC
  Administered 2022-01-05 – 2022-01-09 (×6): 10 mL

## 2022-01-05 MED ORDER — SODIUM CHLORIDE 0.9% FLUSH: 10.0000 mL | INTRAVENOUS | Status: DC | PRN

## 2022-01-05 MED ORDER — STROKE: EARLY STAGES OF RECOVERY BOOK
Freq: Once | Status: AC
  Filled 2022-01-05: qty 1

## 2022-01-05 MED ORDER — ASPIRIN 81 MG PO TBEC
81.0000 mg | DELAYED_RELEASE_TABLET | Freq: Every day | ORAL | Status: DC
  Administered 2022-01-05 – 2022-01-09 (×5): 81 mg via ORAL
  Filled 2022-01-05 (×5): qty 1

## 2022-01-05 NOTE — Progress Notes (Signed)
EEG complete - results pending 

## 2022-01-05 NOTE — Progress Notes (Cosign Needed Addendum)
    Durable Medical Equipment  (From admission, onward)           Start     Ordered   01/05/22 1606  For home use only DME Bedside commode  Once       Comments: No toilet at level of house where patient will be staying.  Question:  Patient needs a bedside commode to treat with the following condition  Answer:  Gait instability   01/05/22 1606

## 2022-01-05 NOTE — Progress Notes (Signed)
LTM EEG hooked up and running - no initial skin breakdown - push button tested - [facility] monitoring.  

## 2022-01-05 NOTE — Progress Notes (Addendum)

## 2022-01-05 NOTE — TOC Initial Note (Signed)
Transition of Care [facility]) - Initial/Assessment Note    Patient Details  Name: Wanda Valdez MRN: [id number] Date of Birth: March 10, 1945  Transition of Care [facility]) CM/SW Contact:    Tom-Johnson, Renea Ee, RN Phone Number: 01/05/2022, 3:25 PM  Clinical Narrative:                  CM spoke with patient at beside about needs for post hospital transition. Admitted for Sepsis. 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 Had a recent Rt shoulder Sx in August. Patient states she is from home with daughter, has two living children. Daughter transports to and from all appointments. Has a cane at home.   PCP is Malachy Moan, MD and updated on chart. Uses [facility] pharmacy on [address] in [place].  Home health referral called in to [facility] per patient's request. Patient states she was previously with [facility] but would like to use another agency. Chose [facility] from Medicare.gov list. Claiborne Billings voiced acceptance and info on AVS. BSC ordered from [employer] and [facility] to deliver at bedside. CM will continue to follow as patient progresses with care towards discharge.    Expected Discharge Plan: [place] Barriers to Discharge: Continued Medical Work up   Patient Goals and CMS Choice Patient states their goals for this hospitalization and ongoing recovery are:: To return home CMS Medicare.gov Compare Post Acute Care list provided to:: Patient Choice offered to / list presented to : Patient  Expected Discharge Plan and Services Expected Discharge Plan: [place]   Discharge Planning Services: CM Consult Post Acute Care Choice: [place] arrangements for the past 2 months: Single Family Home                 DME Arranged: Bedside commode DME Agency: [facility] Date DME Agency Contacted: 01/05/22 Time DME Agency Contacted: 1520 Representative spoke with at DME Agency:  Jodell Cipro HH Arranged: PT, OT HH Agency: [place] Date [place]: 01/05/22 Time [place]: 3846 Representative spoke with at [place]: Claiborne Billings  Prior Living Arrangements/Services Living arrangements for the past 2 months: [place] with:: Adult Children (Daughter) Patient language and need for interpreter reviewed:: Yes Do you feel safe going back to the place where you live?: Yes      Need for Family Participation in Patient Care: Yes (Comment) Care giver support system in place?: Yes (comment) Current home services: DME Kasandra Knudsen) Criminal Activity/Legal Involvement Pertinent to Current Situation/Hospitalization: No - Comment as needed  Activities of Daily Living      Permission Sought/Granted Permission sought to share information with : Case Manager, Customer service manager, Family Supports Permission granted to share information with : Yes, Verbal Permission Granted              Emotional Assessment Appearance:: Appears stated age Attitude/Demeanor/Rapport: Engaged, Gracious Affect (typically observed): Accepting, Appropriate, Calm, Hopeful, Pleasant Orientation: : Oriented to Self, Oriented to Place, Oriented to  Time, Oriented to Situation Alcohol / Substance Use: Not Applicable Psych Involvement: Yes (comment)  Admission diagnosis:  Shock ([place]) [R57.9] Sepsis ([place]) [A41.9] Hypotension due to hypovolemia [I95.89, E86.1] Sepsis, due to unspecified organism, unspecified whether acute organ dysfunction present [facility]) [A41.9] Patient Active Problem List   Diagnosis Date Noted   Hypotension due to hypovolemia 01/02/2022   Acute cystitis without hematuria 01/02/2022   High anion gap metabolic acidosis 65/99/3570  Seizure (HCC) 01/02/2022   Shock (HCC) 01/02/2022   Sepsis (HCC) 01/01/2022   Hypovolemic shock (HCC) 01/26/2021   Syncope 09/19/2020   Current chronic use of systemic steroids 09/19/2020    Protein-calorie malnutrition, severe 09/12/2020   Hypokalemia 09/10/2020   Dysphagia 09/10/2020   Diarrhea 09/10/2020   Dehydration 09/10/2020   Transaminitis 09/10/2020   AKI (acute kidney injury) (HCC) 09/10/2020   Lymphocytic colitis 09/10/2020   Rib pain on right side 09/10/2020   SOB (shortness of breath)    PCP:  Raquel James, MD Pharmacy:   [id number] - [place], [place] - [address] [place] [address] Phone: [phone] Fax: [phone]  [facility] [address] [place], [place] - [address] [place] [address] Phone: [phone] Fax: [phone]     Social Determinants of Health (SDOH) Interventions    Readmission Risk Interventions     No data to display

## 2022-01-05 NOTE — Care Management Important Message (Signed)
Important Message  Patient Details  Name: Wanda Valdez MRN: [id number] Date of Birth: 1945/05/21   Medicare Important Message Given:  Yes     Orbie Pyo 01/05/2022, 4:15 PM

## 2022-01-05 NOTE — Progress Notes (Addendum)
Occupational Therapy Treatment Patient Details Name: Wanda Valdez MRN: [id number] DOB: 05/16/1945 Today's Date: 01/05/2022   History of present illness Wanda Valdez is a 76 y.o. female who was found to be weak complaining of blurred vision and headache was brought to the ER; hypotension, shock (hypovolemic or septic), abnormal CT perfusion study concerning for possible stroke; ARF likely from diarrhea, thrombocytopenia, abdominal tenderness, R shoulder pain (had surgery within the past month); with history of lymphocytic colitis, hypertension who had a recent right shoulder surgery about a month ago   OT comments  Pt progressing towards OT goals this session. Pt DOES need continued education for decreased vision (new) initiated today. Pt was able to perform bed mobility at min A, required mod A for UB and LB dressing (educated in [place] with R shoulder), min A with HHA to walk to sink, perform grooming leaning against sink, and then walk to recliner. Pt is eager to improve and participate in therapy. OT will continue to follow acutely.   Of note from Neuro notes:  2 months of left eye peripheral vision deficit with blurry vision with multiple ophthalmologic evaluations.    Recommendations for follow up therapy are one component of a multi-disciplinary discharge planning process, led by the attending physician.  Recommendations may be updated based on patient status, additional functional criteria and insurance authorization.    Follow Up Recommendations  Home health OT (therapist with vision experience)    Assistance Recommended at Discharge Frequent or constant Supervision/Assistance  Patient can return home with the following  A lot of help with bathing/dressing/bathroom;A little help with walking and/or transfers;Assistance with cooking/housework;Assist for transportation   Equipment Recommendations  BSC/3in1    Recommendations for Other Services      Precautions /  Restrictions Precautions Precautions: Fall;Shoulder Type of Shoulder Precautions: recent R shoulder surgery, no sling and unable to locate any precautions/restrictions. Pt states surgery was at [facility] in August 2023 Restrictions Weight Bearing Restrictions: No Other Position/Activity Restrictions: Uncertain at this time, recent shoulder surgery in [facility] August or [place] 2023??       Mobility Bed Mobility Overal bed mobility: Needs Assistance Bed Mobility: Supine to Sit     Supine to sit: Min guard, HOB elevated     General bed mobility comments: extra time but no physical assist needed min guard    Transfers Overall transfer level: Needs assistance Equipment used: 1 person hand held assist Transfers: Sit to/from Stand, Bed to chair/wheelchair/BSC Sit to Stand: Min assist     Step pivot transfers: Min assist     General transfer comment: Min assist to seady with sit to stand;  Pt reports since sx needing external assist from furniture and walls etc.     Balance Overall balance assessment: Needs assistance   Sitting balance-Leahy Scale: Good       Standing balance-Leahy Scale: Poor Standing balance comment: requires external assist for balance from HHA or furniture etc                           ADL either performed or assessed with clinical judgement   ADL Overall ADL's : Needs assistance/impaired     Grooming: Wash/dry hands;Wash/dry face;Standing;Min guard Grooming Details (indicate cue type and reason): min guard for balance - Pt leaning against the sink         Upper Body Dressing : Moderate assistance;Sitting Upper Body Dressing Details (indicate cue type and reason): to don extra gown like  robe Lower Body Dressing: Moderate assistance Lower Body Dressing Details (indicate cue type and reason): for socks Toilet Transfer: Ambulation;Cueing for safety;BSC/3in1;Minimal assistance Toilet Transfer Details (indicate cue type and reason): HHA - Pt  actively reaching out for stabilization from environment         Functional mobility during ADLs: Minimal assistance (1 person HHA) General ADL Comments: vision and deficits post-op RUE impacting independence in ADL and mobility    Extremity/Trunk Assessment Upper Extremity Assessment RUE Deficits / Details: R shoulder surgery, uncertain of timeline of surgery and if any current limitations and restrictions. pt not wearing a sling and with very limited AROM   Lower Extremity Assessment Lower Extremity Assessment: Defer to PT evaluation        Vision   Vision Assessment?: Vision impaired- to be further tested in functional context;Yes Eye Alignment: Within Functional Limits Ocular Range of Motion: Impaired-to be further tested in functional context Alignment/Gaze Preference: Within Defined Limits Tracking/Visual Pursuits: Impaired - to be further tested in functional context;Requires cues, head turns, or add eye shifts to track Additional Comments: Pt states all is blurry, unable to read clock in room - reports this is new - she was driving prior to this.   Perception     Praxis      Cognition Arousal/Alertness: Awake/alert Behavior During Therapy: WFL for tasks assessed/performed Overall Cognitive Status: No family/caregiver present to determine baseline cognitive functioning                                 General Comments: pt providing conflicting info about PLOF, very pleasant and coopertivae        Exercises      Shoulder Instructions       General Comments      Pertinent Vitals/ Pain       Pain Assessment Pain Assessment: No/denies pain  Home Living                                          Prior Functioning/Environment              Frequency  Min 2X/week        Progress Toward Goals  OT Goals(current goals can now be found in the care plan section)  Progress towards OT goals: Progressing toward goals  Acute  Rehab OT Goals Patient Stated Goal: get back independence OT Goal Formulation: With patient Time For Goal Achievement: 01/17/22 Potential to Achieve Goals: Good ADL Goals Pt Will Perform Grooming: with supervision;with set-up Pt Will Perform Upper Body Bathing: with min assist;with caregiver independent in assisting Pt Will Perform Lower Body Bathing: with min assist;with caregiver independent in assisting Pt Will Perform Upper Body Dressing: with min assist;with caregiver independent in assisting Pt Will Perform Lower Body Dressing: with min assist;with caregiver independent in assisting Pt Will Transfer to Toilet: with supervision;ambulating Pt Will Perform Toileting - Clothing Manipulation and hygiene: with supervision;sit to/from stand;with caregiver independent in assisting Pt Will Perform Tub/Shower Transfer: with min guard assist;with supervision;ambulating;tub bench  Plan Discharge plan remains appropriate    Co-evaluation                 AM-PAC OT "6 Clicks" Daily Activity     Outcome Measure   Help from another person eating meals?: A Little Help from another person taking  care of personal grooming?: A Little Help from another person toileting, which includes using toliet, bedpan, or urinal?: A Little Help from another person bathing (including washing, rinsing, drying)?: A Lot Help from another person to put on and taking off regular upper body clothing?: A Lot Help from another person to put on and taking off regular lower body clothing?: A Lot 6 Click Score: 15    End of Session Equipment Utilized During Treatment: Gait belt  OT Visit Diagnosis: Unsteadiness on feet (R26.81);Other abnormalities of gait and mobility (R26.89);Muscle weakness (generalized) (M62.81)   Activity Tolerance Patient tolerated treatment well   Patient Left in chair;with chair alarm set;with call bell/phone within reach   Nurse Communication Mobility status        Time:  [id number] OT Time Calculation (min): 24 min  Charges: OT General Charges $OT Visit: 1 Visit OT Treatments $Self Care/Home Management : 23-37 mins  Jesse Sans OTR/L Acute Rehabilitation Services Office: [place] 01/05/2022, 1:11 PM

## 2022-01-05 NOTE — Progress Notes (Signed)
Mobility Specialist Progress Note:   01/05/22 1500  Mobility  Activity Transferred from chair to bed  Level of Assistance Minimal assist, patient does 75% or more  Assistive Device Other (Comment) (HHA)  Distance Ambulated (ft) 3 ft  Activity Response Tolerated well  Mobility Referral Yes  $Mobility charge 1 Mobility   Rn requesting pt to transfer back to bed for procedure. Required minA through HHA to transfer. Pt left with all needs met.  Nelta Numbers Acute Rehab Secure Chat or Office Phone: [phone]

## 2022-01-05 NOTE — Progress Notes (Signed)
[facility] PROGRESS NOTE    Progress Note  Wanda Valdez  YYT:[id number] DOB: [birth year] DOA: 01/01/2022 PCP: Pcp, No     Brief Narrative:   Yamaira Valdez is an 76 y.o. female past medical history of lymphocytic colitis, essential hypertension with recent right shoulder surgery about a month ago was found to be weak complaining of blurry vision and headache, accompanied episode of diarrhea unable to get out of bed the day prior to admission comes into the ED code stroke was called as he was also found hypotensive in the ED fluid resuscitated started on Levophed, empiric antibiotics, CT of the head was unremarkable CT perfusion study showed hypoperfusion to the right cerebral hemisphere with a stenosis of the right innominate artery.   Assessment/Plan:   Circulatory shock: Required pressors temporarily  for couple of hours.  Of unclear etiology. Question due to seizures. Blood pressure has remained stable. Continue antibiotics for now. Culture data has remained negative till date discontinue antibiotics. EEG showed possible seizures, will consult neurology.  Acute CVA: MRI of the brain showed an acute cortical infarct of the right parietal region. HgbA1c, fasting lipid panel  PT, OT evaluated the patient recommended home health. Transthoracic Echo, EF of 55% grade 1 diastolic dysfunction. She was previously not on aspirin and started on aspirin. On statins. BP goal: permissive HTN upto 220/120 mmHg No events telemetry monitoring. Neurology on board awaiting further recommendations.  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 EEG showed evidence of seizures neurology was consulted she was started on 8 AEDs  Acute confusional state: She has no diagnosis of dementia spoken to her daughter her mentation fluctuates throughout the day. Continue Haldol and Seroquel as needed  as needed. Try to minimize medication (once daily) at night. She is at high risk of aspiration and agitation.  Acute kidney injury: With a baseline creatinine of less than 1 on admission 1.3 . Basic metabolic panel is pending.  Thrombocytopenia: Normally she is greater than 150,  LDH 154. Resolved  Macrocytic anemia: CBC shows a hemoglobin at baseline, anemia panel showed ferritin 55 will need further evaluation as an out patient on colonoscopy, will start on ferrous sulfate upon discharge. Folate of 40  Diffuse abdominal tenderness: CT of the abdomen pelvis CT of the abdomen showed mild left hydronephrosis no stones, mild bilateral renal atrophy with chronic perinephric stranding,  transverse colitis, small pleural effusions and trace ascites.  No acute abnormalities to explain her diffuse tenderness. Resume Creon. FOBT stools negative.  Hyperglycemia: Likely reactive A1c of 4.8.  Discontinue all insulins.  Essential hypertension: Blood pressure is running up   DVT prophylaxis: Lovenox Family Communication:none Status is: Inpatient Remains inpatient appropriate because: Shock    Code Status:     Code Status Orders  (From admission, onward)           Start     Ordered   01/02/22 0310  Full code  Continuous        01/02/22 0310           Code Status History     Date Active Date Inactive Code Status Order ID Comments User Context   01/26/2021 1846 01/30/2021 2111 Full Code [id number]  Lupita Leash, MD Inpatient   09/19/2020 2142 09/22/2020 1733 Full Code [id number]  Hillary Bow, DO ED   09/10/2020 1849 09/13/2020 2020 Full Code [id number]  Rodolph Bong, MD  Inpatient         IV Access:   Peripheral IV   Procedures and diagnostic studies:   MR BRAIN WO CONTRAST  Result Date: 01/04/2022 CLINICAL DATA:  Neuro deficit and altered mental status EXAM: MRI HEAD WITHOUT CONTRAST TECHNIQUE: Multiplanar, multiecho pulse sequences of the brain and  surrounding structures were obtained without intravenous contrast. COMPARISON:  Head CT and CTA from 3 days ago FINDINGS: Brain: Generalized mild restricted diffusion involving the right parietal cortex with 2 more discrete punctate infarcts at the right postcentral gyrus. No acute hemorrhage, hydrocephalus, or mass effect. Suspected tiny (5 mm) meningioma in the right posterior fossa anterior to the cerebellar hemisphere. Vascular: Major flow voids are preserved Skull and upper cervical spine: Rounded hypointense areas in the biparietal calvarium which have a benign appearance by CT and there is no reported history of malignancy. Sinuses/Orbits: Secretions layer in the left sphenoid sinus. Bilateral cataract resection. 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 Electronically Signed   By: Tiburcio Pea M.D.   On: 01/04/2022 11:13     Medical Consultants:   None.   Subjective:    Wanda Valdez no complaints this morning.  Objective:    Vitals:   01/04/22 1806 01/04/22 2101 01/05/22 0514 01/05/22 0930  BP: 127/84 138/83 (!) 140/61 126/86  Pulse: (!) 58 (!) 59 65 62  Resp:  18 18   Temp: 98.2 F (36.8 C) 97.6 F (36.4 C) 98 F (36.7 C) 98.1 F (36.7 C)  TempSrc: Oral Oral Oral Oral  SpO2: 100% 100% 98% 100%  Weight:      Height:       SpO2: 100 % O2 Flow Rate (L/min): 3 L/min   Intake/Output Summary (Last 24 hours) at 01/05/2022 0954 Last data filed at 01/05/2022 0514 Gross per 24 hour  Intake 475.38 ml  Output 0 ml  Net 475.38 ml    Filed Weights   01/01/22 1903  Weight: 41.6 kg    Exam: General exam: In no acute distress. Respiratory system: Good air movement and clear to auscultation. Cardiovascular system: S1 & S2 heard, RRR. No JVD. Gastrointestinal system: Abdomen is nondistended, soft and nontender.  Extremities: No  pedal edema. Skin: No rashes, lesions or ulcers Psychiatry: Judgement and insight appear normal. Mood & affect appropriate. Data Reviewed:    Labs: Basic Metabolic Panel: Recent Labs  Lab 01/01/22 1900 01/01/22 1905 01/02/22 0516 01/03/22 1032  NA 142 139  --  142  K 5.0 4.8  --  3.2*  CL 107 110  --  100  CO2 13*  --   --  27  GLUCOSE 215* 211*  --  107*  BUN 13 10  --  14  CREATININE 1.60* 1.30* 1.16* 1.20*  CALCIUM 9.9  --   --  8.7*    GFR Estimated Creatinine Clearance: 24.9 mL/min (A) (by C-G formula based on SCr of 1.2 mg/dL (H)). Liver Function Tests: Recent Labs  Lab 01/01/22 1900  AST 31  ALT 15  ALKPHOS 87  BILITOT 0.7  PROT 6.3*  ALBUMIN 3.3*    No results for input(s): "LIPASE", "AMYLASE" in the last 168 hours. Recent Labs  Lab 01/01/22 1950  AMMONIA 33    Coagulation profile Recent Labs  Lab 01/01/22 1900  INR 1.0    COVID-19 Labs  Recent Labs    01/02/22 1128  FERRITIN 55  LDH 154     Lab Results  Component Value Date   SARSCOV2NAA NEGATIVE 01/26/2021   SARSCOV2NAA NEGATIVE 09/19/2020   SARSCOV2NAA NEGATIVE 09/10/2020    CBC: Recent Labs  Lab 01/01/22 1900 01/01/22 1905 01/02/22 0516 01/03/22 1032  WBC 8.0  --  4.7 4.4  NEUTROABS 3.0  --   --  2.7  HGB 13.2 13.9 11.2* 11.6*  HCT 40.6 41.0 33.9* 35.1*  MCV 104.6*  --  102.4* 101.2*  PLT 262  --  83* 182    Cardiac Enzymes: No results for input(s): "CKTOTAL", "CKMB", "CKMBINDEX", "TROPONINI" in the last 168 hours. BNP (last 3 results) No results for input(s): "PROBNP" in the last 8760 hours. CBG: Recent Labs  Lab 01/04/22 1110 01/04/22 1647 01/04/22 2101 01/05/22 0724 01/05/22 0745  GLUCAP 128* 122* 107* 59* 77    D-Dimer: No results for input(s): "DDIMER" in the last 72 hours. Hgb A1c: No results for input(s): "HGBA1C" in the last 72 hours.  Lipid Profile: No results for input(s): "CHOL", "HDL", "LDLCALC", "TRIG", "CHOLHDL", "LDLDIRECT" in the last  72 hours.  Thyroid function studies: No results for input(s): "TSH", "T4TOTAL", "T3FREE", "THYROIDAB" in the last 72 hours.  Invalid input(s): "FREET3"  Anemia work up: Recent Labs    01/02/22 1128  FOLATE >40.0  FERRITIN 55  TIBC 227*  IRON 43  RETICCTPCT 1.8    Sepsis Labs: Recent Labs  Lab 01/01/22 1900 01/01/22 1950 01/01/22 2311 01/02/22 0516 01/03/22 1032  WBC 8.0  --   --  4.7 4.4  LATICACIDVEN  --  4.1* 2.0* 1.6  --     Microbiology Recent Results (from the past 240 hour(s))  Culture, blood (routine x 2)     Status: None (Preliminary result)   Collection Time: 01/01/22  7:50 PM   Specimen: BLOOD RIGHT WRIST  Result Value Ref Range Status   Specimen Description BLOOD RIGHT WRIST  Final   Special Requests   Final    BOTTLES DRAWN AEROBIC AND ANAEROBIC Blood Culture results may not be optimal due to an inadequate volume of blood received in culture bottles   Culture   Final    NO GROWTH 4 DAYS Performed at [facility], [address], [place], [place] [address]    Report Status PENDING  Incomplete  Culture, blood (routine x 2)     Status: None (Preliminary result)   Collection Time: 01/01/22  8:05 PM   Specimen: BLOOD LEFT HAND  Result Value Ref Range Status   Specimen Description BLOOD LEFT HAND  Final   Special Requests   Final    BOTTLES DRAWN AEROBIC AND ANAEROBIC Blood Culture results may not be optimal due to an inadequate volume of blood received in culture bottles   Culture   Final    NO GROWTH 4 DAYS Performed at [facility], [address], [place], [place] [address]    Report Status PENDING  Incomplete  Urine Culture     Status: Abnormal   Collection Time: 01/02/22  1:20 AM   Specimen: In/Out Cath Urine  Result Value Ref Range Status   Specimen Description IN/OUT CATH URINE  Final   Special Requests   Final    NONE Performed at [facility], [address], [place], [place] [address]    Culture MULTIPLE SPECIES  PRESENT, SUGGEST RECOLLECTION (A)  Final   Report Status 01/03/2022 FINAL  Final     Medications:  allopurinol  100 mg Oral Daily   atorvastatin  20 mg Oral Daily   cholestyramine  1 packet Oral BID   enoxaparin (LOVENOX) injection  30 mg Subcutaneous Q24H   levETIRAcetam  500 mg Oral BID   lipase/protease/amylase  24,000 Units Oral TID AC   LORazepam  1 mg Intravenous Once   metoprolol tartrate  25 mg Oral Daily   pantoprazole  40 mg Oral Daily   predniSONE  10 mg Oral Q breakfast   sodium chloride flush  10-40 mL Intracatheter Q12H   sucralfate  1 g Oral TID   Continuous Infusions:  sodium chloride Stopped (01/02/22 0412)   ceFEPime (MAXIPIME) IV 2 g (01/04/22 2126)   dextrose Stopped (01/04/22 2125)   vancomycin 500 mg (01/03/22 2011)      LOS: 4 days   Charlynne Cousins  [facility]  01/05/2022, 9:54 AM

## 2022-01-05 NOTE — Plan of Care (Signed)
Problem: Education: Goal: Knowledge of disease or condition will improve 01/05/2022 1429 by Tresa Endo, RN Outcome: Progressing 01/05/2022 1428 by Tresa Endo, RN Outcome: Not Progressing Goal: Knowledge of secondary prevention will improve (MUST DOCUMENT ALL) 01/05/2022 1429 by Tresa Endo, RN Outcome: Progressing 01/05/2022 1428 by Tresa Endo, RN Outcome: Not Progressing Goal: Knowledge of patient specific risk factors will improve Loraine Leriche N/A or DELETE if not current risk factor) 01/05/2022 1429 by Tresa Endo, RN Outcome: Progressing 01/05/2022 1428 by Tresa Endo, RN Outcome: Not Progressing   Problem: Ischemic Stroke/TIA Tissue Perfusion: Goal: Complications of ischemic stroke/TIA will be minimized 01/05/2022 1429 by Tresa Endo, RN Outcome: Progressing 01/05/2022 1428 by Tresa Endo, RN Outcome: Not Progressing   Problem: Coping: Goal: Will verbalize positive feelings about self 01/05/2022 1429 by Tresa Endo, RN Outcome: Progressing 01/05/2022 1428 by Tresa Endo, RN Outcome: Not Progressing Goal: Will identify appropriate support needs 01/05/2022 1429 by Tresa Endo, RN Outcome: Progressing 01/05/2022 1428 by Tresa Endo, RN Outcome: Not Progressing   Problem: Health Behavior/Discharge Planning: Goal: Ability to manage health-related needs will improve 01/05/2022 1429 by Tresa Endo, RN Outcome: Progressing 01/05/2022 1428 by Tresa Endo, RN Outcome: Not Progressing Goal: Goals will be collaboratively established with patient/family 01/05/2022 1429 by Tresa Endo, RN Outcome: Progressing 01/05/2022 1428 by Tresa Endo, RN Outcome: Not Progressing   Problem: Self-Care: Goal: Ability to participate in self-care as condition permits will improve 01/05/2022 1429 by Tresa Endo, RN Outcome: Progressing 01/05/2022 1428 by Tresa Endo, RN Outcome: Not Progressing Goal: Verbalization of feelings and concerns over difficulty  with self-care will improve 01/05/2022 1429 by Tresa Endo, RN Outcome: Progressing 01/05/2022 1428 by Tresa Endo, RN Outcome: Not Progressing Goal: Ability to communicate needs accurately will improve 01/05/2022 1429 by Tresa Endo, RN Outcome: Progressing 01/05/2022 1428 by Tresa Endo, RN Outcome: Not Progressing   Problem: Nutrition: Goal: Risk of aspiration will decrease 01/05/2022 1429 by Tresa Endo, RN Outcome: Progressing 01/05/2022 1428 by Tresa Endo, RN Outcome: Not Progressing Goal: Dietary intake will improve 01/05/2022 1429 by Tresa Endo, RN Outcome: Progressing 01/05/2022 1428 by Tresa Endo, RN Outcome: Not Progressing   Problem: Education: Goal: Ability to describe self-care measures that may prevent or decrease complications (Diabetes Survival Skills Education) will improve 01/05/2022 1429 by Tresa Endo, RN Outcome: Progressing 01/05/2022 1428 by Tresa Endo, RN Outcome: Not Progressing Goal: Individualized Educational Video(s) 01/05/2022 1429 by Tresa Endo, RN Outcome: Progressing 01/05/2022 1428 by Tresa Endo, RN Outcome: Not Progressing   Problem: Coping: Goal: Ability to adjust to condition or change in health will improve 01/05/2022 1429 by Tresa Endo, RN Outcome: Progressing 01/05/2022 1428 by Tresa Endo, RN Outcome: Not Progressing   Problem: Fluid Volume: Goal: Ability to maintain a balanced intake and output will improve 01/05/2022 1429 by Tresa Endo, RN Outcome: Progressing 01/05/2022 1428 by Tresa Endo, RN Outcome: Not Progressing   Problem: Health Behavior/Discharge Planning: Goal: Ability to identify and utilize available resources and services will improve 01/05/2022 1429 by Tresa Endo, RN Outcome: Progressing 01/05/2022 1428 by Tresa Endo, RN Outcome: Not Progressing Goal: Ability to manage health-related needs will improve 01/05/2022 1429 by Tresa Endo, RN Outcome:  Progressing 01/05/2022 1428 by Tresa Endo, RN Outcome: Not Progressing   Problem: Metabolic: Goal: Ability to maintain appropriate glucose levels  will improve 01/05/2022 1429 by Virgina Jock, RN Outcome: Progressing 01/05/2022 1428 by Virgina Jock, RN Outcome: Not Progressing   Problem: Nutritional: Goal: Maintenance of adequate nutrition will improve 01/05/2022 1429 by Virgina Jock, RN Outcome: Progressing 01/05/2022 1428 by Virgina Jock, RN Outcome: Not Progressing Goal: Progress toward achieving an optimal weight will improve 01/05/2022 1429 by Virgina Jock, RN Outcome: Progressing 01/05/2022 1428 by Virgina Jock, RN Outcome: Not Progressing   Problem: Skin Integrity: Goal: Risk for impaired skin integrity will decrease 01/05/2022 1429 by Virgina Jock, RN Outcome: Progressing 01/05/2022 1428 by Virgina Jock, RN Outcome: Not Progressing   Problem: Tissue Perfusion: Goal: Adequacy of tissue perfusion will improve 01/05/2022 1429 by Virgina Jock, RN Outcome: Progressing 01/05/2022 1428 by Virgina Jock, RN Outcome: Not Progressing   Problem: Education: Goal: Knowledge of General Education information will improve Description: Including pain rating scale, medication(s)/side effects and non-pharmacologic comfort measures 01/05/2022 1429 by Virgina Jock, RN Outcome: Progressing 01/05/2022 1428 by Virgina Jock, RN Outcome: Not Progressing   Problem: Health Behavior/Discharge Planning: Goal: Ability to manage health-related needs will improve 01/05/2022 1429 by Virgina Jock, RN Outcome: Progressing 01/05/2022 1428 by Virgina Jock, RN Outcome: Not Progressing   Problem: Clinical Measurements: Goal: Ability to maintain clinical measurements within normal limits will improve 01/05/2022 1429 by Virgina Jock, RN Outcome: Progressing 01/05/2022 1428 by Virgina Jock, RN Outcome: Not Progressing Goal: Will remain free from infection 01/05/2022 1429 by  Virgina Jock, RN Outcome: Progressing 01/05/2022 1428 by Virgina Jock, RN Outcome: Not Progressing Goal: Diagnostic test results will improve 01/05/2022 1429 by Virgina Jock, RN Outcome: Progressing 01/05/2022 1428 by Virgina Jock, RN Outcome: Not Progressing Goal: Respiratory complications will improve 01/05/2022 1429 by Virgina Jock, RN Outcome: Progressing 01/05/2022 1428 by Virgina Jock, RN Outcome: Not Progressing Goal: Cardiovascular complication will be avoided 01/05/2022 1429 by Virgina Jock, RN Outcome: Progressing 01/05/2022 1428 by Virgina Jock, RN Outcome: Not Progressing   Problem: Activity: Goal: Risk for activity intolerance will decrease 01/05/2022 1429 by Virgina Jock, RN Outcome: Progressing 01/05/2022 1428 by Virgina Jock, RN Outcome: Not Progressing   Problem: Nutrition: Goal: Adequate nutrition will be maintained 01/05/2022 1429 by Virgina Jock, RN Outcome: Progressing 01/05/2022 1428 by Virgina Jock, RN Outcome: Not Progressing   Problem: Coping: Goal: Level of anxiety will decrease 01/05/2022 1429 by Virgina Jock, RN Outcome: Progressing 01/05/2022 1428 by Virgina Jock, RN Outcome: Not Progressing   Problem: Elimination: Goal: Will not experience complications related to bowel motility 01/05/2022 1429 by Virgina Jock, RN Outcome: Progressing 01/05/2022 1428 by Virgina Jock, RN Outcome: Not Progressing Goal: Will not experience complications related to urinary retention 01/05/2022 1429 by Virgina Jock, RN Outcome: Progressing 01/05/2022 1428 by Virgina Jock, RN Outcome: Not Progressing   Problem: Pain Managment: Goal: General experience of comfort will improve 01/05/2022 1429 by Virgina Jock, RN Outcome: Progressing 01/05/2022 1428 by Virgina Jock, RN Outcome: Not Progressing   Problem: Safety: Goal: Ability to remain free from injury will improve 01/05/2022 1429 by Virgina Jock, RN Outcome:  Progressing 01/05/2022 1428 by Virgina Jock, RN Outcome: Not Progressing   Problem: Skin Integrity: Goal: Risk for impaired skin integrity will decrease 01/05/2022 1429 by Virgina Jock, RN Outcome: Progressing 01/05/2022 1428 by Virgina Jock, RN Outcome: Not Progressing

## 2022-01-05 NOTE — Procedures (Signed)
Patient Name: Wanda Valdez  MRN: [id number]  Epilepsy Attending: Lora Havens  Referring Physician/Provider: Rikki Spearing, NP  Date: 01/05/2022 Duration: 22.41 mins  Patient history: 76 year old female with jerking episodes.  EEG to evaluate for seizure.   Level of alertness: Awake  AEDs during EEG study: LEV  Technical aspects: This EEG study was done with scalp electrodes positioned according to the 10-20 International system of electrode placement. Electrical activity was reviewed with band pass filter of 1-70Hz , sensitivity of 7 uV/mm, display speed of 48mm/sec with a 60Hz  notched filter applied as appropriate. EEG data were recorded continuously and digitally stored.  Video monitoring was available and reviewed as appropriate.  Description: No posterior dominant rhythm was seen. EEG showed 6-9Hz  alpha activity in left hemisphere and continuous 3-5 hz theta-delta slowing in right hemisphere . Lateralized periodic discharges with overriding fast activity at 1hz  were noted in right hemisphere, maximal right occipital region which at times appear rhythmic lasting 2-9 seconds consistent with brief-ictal-interictal rhythmic discharges     ABNORMALITY - Brief-ictal-interictal rhythmic discharges, right hemisphere, maximal right occipital region  - Lateralized periodic discharges with overriding fast activity, ( LPD +F), right hemisphere, maximal right occipital region  -Continuous slow, generalized and lateralized right hemisphere   IMPRESSION: This study showed evidence of epileptogenicity arising from right hemisphere, maximal right occipital region which is on the ictal-interictal continuum with increased risk of seizure recurrence.  Additionally there is cortical dysfunction in right hemisphere likely secondary to underlying structural abnormality. Lastly there is moderate diffuse encephalopathy, non specific etiology.   Recommend long term eeg for further evaluation    Yulianna Folse Barbra Sarks

## 2022-01-05 NOTE — Progress Notes (Addendum)
STROKE TEAM PROGRESS NOTE   INTERVAL HISTORY There is no family at the bedside. Patient states she was shaking at home because she needed to use the restroom but family was concerned for a stroke and activated EMS.  Patient endorses 2 months of left eye peripheral vision deficit with blurry vision with multiple ophthalmologic evaluations.  On exam patient complains of shoulder pain right > left with recent shoulder surgery. Also she complains of left forearm pain related to an infiltrated IV.  Patient complains of ongoing blurry vision, visual hallucinations, and loss of appetite.  MRI scan of the brain shows small acute cortical infarct right parietal lobe.  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   Basic Metabolic Panel:  Recent Labs  Lab 01/01/22 1900 01/01/22 1905 01/02/22 0516 01/03/22 1032  NA 142 139  --  142  K 5.0 4.8  --  3.2*  CL 107 110  --  100  CO2 13*  --   --  27  GLUCOSE 215* 211*  --  107*  BUN 13 10  --  14  CREATININE 1.60* 1.30* 1.16* 1.20*  CALCIUM 9.9  --   --  8.7*   Lipid Panel:  Recent Labs  Lab 01/02/22 0516  CHOL 167  TRIG 45  HDL 82  CHOLHDL 2.0  VLDL 9  LDLCALC 76   HgbA1c:  Recent Labs  Lab 01/02/22 0516  HGBA1C 4.8   Urine Drug Screen:  Recent Labs  Lab  01/02/22 0120  LABOPIA NONE DETECTED  COCAINSCRNUR NONE DETECTED  LABBENZ NONE DETECTED  AMPHETMU NONE DETECTED  THCU NONE DETECTED  LABBARB NONE DETECTED    Alcohol Level  Recent Labs  Lab 01/01/22 1900  ETH <10   IMAGING past 24 hours MR BRAIN WO CONTRAST  Result Date: 01/04/2022 CLINICAL DATA:  Neuro deficit and altered mental status EXAM: MRI HEAD WITHOUT CONTRAST TECHNIQUE: Multiplanar, multiecho pulse sequences of the brain and surrounding structures were obtained without intravenous contrast. COMPARISON:  Head CT and CTA from 3 days ago FINDINGS: Brain: Generalized mild restricted diffusion involving the right parietal cortex with 2 more discrete punctate infarcts at the right postcentral gyrus. No acute hemorrhage, hydrocephalus, or mass effect. Suspected tiny (5 mm) meningioma in the right posterior fossa anterior to the cerebellar hemisphere. Vascular: Major flow voids are preserved Skull and upper cervical spine: Rounded hypointense areas in the biparietal calvarium which have a benign appearance by CT and there is no reported history of malignancy. Sinuses/Orbits: Secretions layer  in the left sphenoid sinus. Bilateral cataract resection. 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 Electronically Signed   By: Tiburcio Pea M.D.   On: 01/04/2022 11:13    PHYSICAL EXAM  Physical Exam  Constitutional: Appears well-developed and well-nourished.  Psych: Affect appropriate to situation, calm and cooperative with exam Eyes: No scleral injection HENT: No OP obstrucion MSK: no joint deformities.  Cardiovascular: Normal rate and regular rhythm.  Respiratory: Effort normal, non-labored breathing on room air GI: Soft.  No distension. There is no tenderness.  Skin: WDI  Neuro: Mental Status: Patient is awake, alert, oriented to person, place, month,  year, and situation. Patient is able to give a clear and coherent history. No signs of aphasia or neglect. Speech is fluent, attention and concentration are intact.  Cranial Nerves: II: Visual Fields are full with complaints of blurred vision in left eye left peripheral field (chronic for 2 months) Pupils are equal, round, and reactive to light.   III,IV, VI: EOMI without ptosis or diploplia.  V: Facial sensation is symmetric to light touch VII: Facial movement is symmetric resting and smiling VIII: Hearing is intact to voice X: Palate elevates symmetrically XI: Shoulder shrug is symmetric. XII: Tongue protrudes midline without atrophy or fasciculations.  Motor: Tone is normal. Bulk is normal. 5/5 strength was present in bilateral lower extremities.  Bilateral upper extremities elevate antigravity with some pain limited strength assessment due to right > left shoulder pain. (Total right shoulder arthoplasty in August 2023).  Prominent asterixis noted in both upper extremities Sensory: Sensation is symmetric to light touch and temperature in the arms and legs. No extinction to DSS present.  Cerebellar: No overt dysmetria noted  ASSESSMENT/PLAN [name] is a 76 y.o. female with history of HTN, tobacco use, chronic diarrhea, poor PO intake, history of hospitalization in December 2022 for hypovolemic shock related to poor intake, and AKI presenting with dizziness, headache, and blurred vision. EMS noted generalized jerking body movements on arrival, though she was able to walk during these movements. On arrival patient was significantly hypotensive with SBP of 60-70s. TNK was not administered due to patient presenting outside of the thrombolytic therapy time window. CTP showed hypoperfusion in the right cerebral hemisphere correlating with significant stenosis of the right innominate. Patient was felt to have cerebral hypoperfusion versus toxic-metabolic encephalopathy with subsequent  asterixis versus myoclonic jerking movements in the setting of deranged renal function. 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    Stroke:  right parietal infarct. 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  Code Stroke CT head motion degraded without acute intracranial abnormality. ASPECTS of 10. CTA head & neck motion degraded. Negative CTA without acute LVO. 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 Diffuse tortuosity of the major arterial vasculature of the head and neck, suggesting chronic underlying hypertension.  CT perfusion No evidence for acute core infarction. 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  2D Echo LVEF  60-65%, left atrial size is moderately dilated. No atrial level shunt detected.  EEG 11/2 with epileptogenicity arising from the right occipital region, cortical dysfunction in the right hemisphere likely secondary to underlying structural abnormality, postictal state. No seizures noted. May need loop recorder at discharge  LDL 76 HgbA1c 4.8 VTE prophylaxis - Lovenox SQ    Diet   Diet heart healthy/carb modified Room service appropriate? Yes; Fluid consistency: Thin   No antithrombotic prior to admission, now on aspirin 81 mg and Plavix 75 mg daily x 3 weeks and then aspirin alone.  Therapy recommendations:  Home Health OT, Home health SLP, Home health PT Disposition:  pending   Concern for possible seizure EEG as above with possible epileptogenicity from right occipital region and cortical dysfunction in the  right hemisphere likely secondary to underlying structural abnormality, postictal state. No seizures noted.  No history of seizures  Continue Keppra 500 mg PO BID Seizure precautions   Hypertension Home meds:  metoprolol  Stable Permissive hypertension (OK if < 220/120) but gradually normalize in 5-7 days Long-term BP goal normotensive  Hypotension with circulatory shock, resolved History of hypovolemic shock related to poor PO intake  Encourage adequate PO intake  Required pressors briefly on hospital arrival   Hyperlipidemia Home meds:  atorvastatin 20 mg PO, resumed in hospital LDL 76, goal < 70 High intensity statin not indicated as LDL is near goal on current dose  Continue statin at discharge  Other Stroke Risk Factors Advanced Age >/= 71  Remote history of Cigarette smoking  Other Active Problems AKI Thrombocytopenia Anemia  Circulatory shock requiring vasopressors on arrival, resolved  Hospital day # 4  Anibal Henderson, AGACNP-BC [facility] Pager: [phone]  STROKE MD NOTE :  I have personally obtained history,examined this patient, reviewed notes, independently viewed imaging studies, participated in medical decision making and plan of care.ROS completed by me personally and pertinent positives fully documented  I have made any additions or clarifications directly to the above note. Agree with note above.  Patient presented with hypotension with systolic blood pressure in the 60s to 70s with dizziness blurred vision and headache.  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  Recommend   aspirin alone due to thrombocytopenia with platelet count 83,000.  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  We will consult interventional neuroradiology for the same.  Continue ongoing stroke work-up.   Aggressive risk factor modification.  Greater than 50% time during this 50-minute visit was spent in counseling and coordination of care about her stroke as well as discussion about brachiocephalic artery stenosis and discussion about revascularization and answering questions  Antony Contras, MD Medical Director [facility] Pager: [phone] 01/05/2022 4:16 PM  To contact Stroke Continuity provider, please refer to [email or web address] After hours, contact General Neurology

## 2022-01-05 NOTE — Plan of Care (Signed)
  Problem: Ischemic Stroke/TIA Tissue Perfusion: Goal: Complications of ischemic stroke/TIA will be minimized Outcome: Progressing   

## 2022-01-06 DIAGNOSIS — R569 Unspecified convulsions: Secondary | ICD-10-CM | POA: Diagnosis not present

## 2022-01-06 LAB — CBC
HCT: 35.1 % — ABNORMAL LOW (ref 36.0–46.0)
Hemoglobin: 11.3 g/dL — ABNORMAL LOW (ref 12.0–15.0)
MCH: 33.3 pg (ref 26.0–34.0)
MCHC: 32.2 g/dL (ref 30.0–36.0)
MCV: 103.5 fL — ABNORMAL HIGH (ref 80.0–100.0)
Platelets: 141 10*3/uL — ABNORMAL LOW (ref 150–400)
RBC: 3.39 MIL/uL — ABNORMAL LOW (ref 3.87–5.11)
RDW: 14 % (ref 11.5–15.5)
WBC: 3.2 10*3/uL — ABNORMAL LOW (ref 4.0–10.5)
nRBC: 0 % (ref 0.0–0.2)

## 2022-01-06 LAB — CULTURE, BLOOD (ROUTINE X 2)
Culture: NO GROWTH
Culture: NO GROWTH

## 2022-01-06 LAB — PROTIME-INR
INR: 1 (ref 0.8–1.2)
Prothrombin Time: 13.1 seconds (ref 11.4–15.2)

## 2022-01-06 LAB — GLUCOSE, CAPILLARY
Glucose-Capillary: 90 mg/dL (ref 70–99)
Glucose-Capillary: 93 mg/dL (ref 70–99)
Glucose-Capillary: 78 mg/dL (ref 70–99)

## 2022-01-06 LAB — BASIC METABOLIC PANEL
Anion gap: 6 (ref 5–15)
BUN: 9 mg/dL (ref 8–23)
CO2: 21 mmol/L — ABNORMAL LOW (ref 22–32)
Calcium: 8.7 mg/dL — ABNORMAL LOW (ref 8.9–10.3)
Chloride: 114 mmol/L — ABNORMAL HIGH (ref 98–111)
Creatinine, Ser: 1.08 mg/dL — ABNORMAL HIGH (ref 0.44–1.00)
GFR, Estimated: 53 mL/min — ABNORMAL LOW (ref >60)
Glucose, Bld: 89 mg/dL (ref 70–99)
Potassium: 3.7 mmol/L (ref 3.5–5.1)
Sodium: 141 mmol/L (ref 135–145)

## 2022-01-06 MED ORDER — ENOXAPARIN SODIUM 30 MG/0.3ML IJ SOSY
30.0000 mg | PREFILLED_SYRINGE | INTRAMUSCULAR | Status: DC
  Administered 2022-01-06 – 2022-01-08 (×3): 30 mg via SUBCUTANEOUS
  Filled 2022-01-06 (×3): qty 0.3

## 2022-01-06 MED ORDER — GUAIFENESIN-DM 100-10 MG/5ML PO SYRP
5.0000 mL | ORAL_SOLUTION | ORAL | Status: DC | PRN
  Administered 2022-01-07: 5 mL via ORAL
  Filled 2022-01-06: qty 5

## 2022-01-06 MED ORDER — VALPROATE SODIUM 100 MG/ML IV SOLN
1000.0000 mg | INTRAVENOUS | Status: DC
  Filled 2022-01-06 (×11): qty 10

## 2022-01-06 MED ORDER — VALPROATE SODIUM 100 MG/ML IV SOLN
1000.0000 mg | Freq: Once | INTRAVENOUS | Status: AC
  Administered 2022-01-06: 1000 mg via INTRAVENOUS
  Filled 2022-01-06: qty 10

## 2022-01-06 MED ORDER — MELATONIN 3 MG PO TABS
3.0000 mg | ORAL_TABLET | Freq: Every day | ORAL | Status: DC
  Administered 2022-01-06 – 2022-01-08 (×3): 3 mg via ORAL
  Filled 2022-01-06 (×3): qty 1

## 2022-01-06 MED ORDER — DIVALPROEX SODIUM 125 MG PO CSDR
250.0000 mg | DELAYED_RELEASE_CAPSULE | Freq: Three times a day (TID) | ORAL | Status: DC
  Administered 2022-01-06 – 2022-01-07 (×2): 250 mg via ORAL
  Filled 2022-01-06 (×4): qty 2

## 2022-01-06 NOTE — Progress Notes (Signed)
STROKE TEAM PROGRESS NOTE   INTERVAL HISTORY Patient is lying comfortably in bed.  She has long-term EEG monitoring hooked up.  EEG yesterday showed evidence of epileptogenic activity in the right occipital region on the ictal/interictal continuum with increased seizure risk.  She has been placed on long-term EEG monitoring since then which has further shown average 3 seizures per hour lasting 1-1/2 minutes arising from the right occipital region.  She was started on Keppra yesterday but seizures continued this morning and Depakote has been added.  Diagnostic cerebral catheter angiogram was planned but is not postponed till tomorrow.  Vital signs are stable.  Neurological exam is unchanged. Vitals:   01/05/22 1733 01/05/22 2241 01/06/22 0455 01/06/22 0850  BP: (!) 148/91 (!) 149/76 125/86 126/77  Pulse: (!) 58 (!) 54 64 68  Resp:  18 18 18   Temp: 98.5 F (36.9 C) 98.1 F (36.7 C) 98.4 F (36.9 C) 98.6 F (37 C)  TempSrc: Oral Oral Oral Oral  SpO2: 100% 98% 96% 98%  Weight:      Height:       CBC:  Recent Labs  Lab 01/01/22 1900 01/01/22 1905 01/03/22 1032 01/06/22 0141  WBC 8.0   < > 4.4 3.2*  NEUTROABS 3.0  --  2.7  --   HGB 13.2   < > 11.6* 11.3*  HCT 40.6   < > 35.1* 35.1*  MCV 104.6*   < > 101.2* 103.5*  PLT 262   < > 182 141*   < > = values in this interval not displayed.   Basic Metabolic Panel:  Recent Labs  Lab 01/05/22 1138 01/06/22 0345  NA 139 141  K 3.8 3.7  CL 110 114*  CO2 19* 21*  GLUCOSE 97 89  BUN 8 9  CREATININE 1.04* 1.08*  CALCIUM 8.9 8.7*   Lipid Panel:  Recent Labs  Lab 01/02/22 0516  CHOL 167  TRIG 45  HDL 82  CHOLHDL 2.0  VLDL 9  LDLCALC 76   HgbA1c:  Recent Labs  Lab 01/02/22 0516  HGBA1C 4.8   Urine Drug Screen:  Recent Labs  Lab 01/02/22 0120  LABOPIA NONE DETECTED  COCAINSCRNUR NONE DETECTED  LABBENZ NONE DETECTED  AMPHETMU NONE DETECTED  THCU NONE DETECTED  LABBARB NONE DETECTED    Alcohol Level  Recent Labs   Lab 01/01/22 1900  ETH <10   IMAGING past 24 hours Overnight EEG with video  Result Date: 01/06/2022 Lora Havens, MD     01/06/2022 10:29 AM Patient Name: Wanda Valdez MRN: [id number] Epilepsy Attending: Lora Havens Referring Physician/Provider: Lora Havens, MD Duration: 01/05/2022 2220 to 01/06/2022 0730  Patient history: 76 year old female with jerking episodes.  EEG to evaluate for seizure.  Level of alertness: Awake, asleep  AEDs during EEG study: LEV  Technical aspects: This EEG study was done with scalp electrodes positioned according to the 10-20 International system of electrode placement. Electrical activity was reviewed with band pass filter of 1-70Hz , sensitivity of 7 uV/mm, display speed of 20mm/sec with a 60Hz  notched filter applied as appropriate. EEG data were recorded continuously and digitally stored.  Video monitoring was available and reviewed as appropriate.  Description: No posterior dominant rhythm was seen. Sleep was characterized by sleep spindles (12 to 14 Hz), maximal frontocentral region.  EEG showed 6-9Hz  alpha activity in left hemisphere and continuous 3-5 hz theta-delta slowing in right hemisphere. Lateralized periodic discharges with overriding fast activity at 1hz  were noted in right hemisphere,  maximal right occipital region which at times appear rhythmic lasting 2-9 seconds consistent with brief-ictal-interictal rhythmic discharges.  Seizures without clinical signs were noted arising from right occipital region during which EEG showed 2.5 to 3 Hz polyspikes in right occipital region admixed with 4 to 5 Hz theta slowing which gradually evolved into 2 to 3 Hz delta slowing.  Average 3 seizures were seen per hour, lasting about 1.5 minutes.  ABNORMALITY -Seizure without clinical signs, right occipital region - Brief-ictal-interictal rhythmic discharges, right hemisphere, maximal right occipital region - Lateralized periodic discharges with overriding fast  activity, ( LPD +F), right hemisphere, maximal right occipital region -Continuous slow, generalized and lateralized right hemisphere  IMPRESSION: This study showed seizures without clinical signs arising from right occipital region, average 3 seizures per hour lasting about 1.5 minutes. There was also evidence of epileptogenicity arising from right hemisphere, maximal right occipital region with increased risk of seizure recurrence.  Additionally there is cortical dysfunction in right hemisphere likely secondary to underlying structural abnormality. Lastly there is moderate diffuse encephalopathy, non specific etiology.  Charlsie Quest   EEG adult  Result Date: 01/05/2022 Charlsie Quest, MD     01/05/2022  5:55 PM Patient Name: Wanda Valdez MRN: [id number] Epilepsy Attending: Charlsie Quest Referring Physician/Provider: Kara Mead, NP Date: 01/05/2022 Duration: 22.41 mins Patient history: 76 year old female with jerking episodes.  EEG to evaluate for seizure. Level of alertness: Awake AEDs during EEG study: LEV Technical aspects: This EEG study was done with scalp electrodes positioned according to the 10-20 International system of electrode placement. Electrical activity was reviewed with band pass filter of 1-70Hz , sensitivity of 7 uV/mm, display speed of 6mm/sec with a 60Hz  notched filter applied as appropriate. EEG data were recorded continuously and digitally stored.  Video monitoring was available and reviewed as appropriate. Description: No posterior dominant rhythm was seen. EEG showed 6-9Hz  alpha activity in left hemisphere and continuous 3-5 hz theta-delta slowing in right hemisphere . Lateralized periodic discharges with overriding fast activity at 1hz  were noted in right hemisphere, maximal right occipital region which at times appear rhythmic lasting 2-9 seconds consistent with brief-ictal-interictal rhythmic discharges   ABNORMALITY - Brief-ictal-interictal rhythmic discharges,  right hemisphere, maximal right occipital region - Lateralized periodic discharges with overriding fast activity, ( LPD +F), right hemisphere, maximal right occipital region -Continuous slow, generalized and lateralized right hemisphere  IMPRESSION: This study showed evidence of epileptogenicity arising from right hemisphere, maximal right occipital region which is on the ictal-interictal continuum with increased risk of seizure recurrence.  Additionally there is cortical dysfunction in right hemisphere likely secondary to underlying structural abnormality. Lastly there is moderate diffuse encephalopathy, non specific etiology.  Recommend long term eeg for further evaluation  Priyanka O Yadav    PHYSICAL EXAM  Physical Exam  Constitutional: Appears well-developed and well-nourished.  Pleasant elderly African-American lady not in distress. Psych: Affect appropriate to situation, calm and cooperative with exam Eyes: No scleral injection HENT: No OP obstrucion MSK: no joint deformities.  Cardiovascular: Normal rate and regular rhythm.  Respiratory: Effort normal, non-labored breathing on room air GI: Soft.  No distension. There is no tenderness.  Skin: WDI  Neuro: Mental Status: Patient is awake, alert, oriented to person, place, month, year, and situation. Patient is able to give a clear and coherent history. No signs of aphasia or neglect. Speech is fluent, attention and concentration are intact.  Cranial Nerves: II: Visual Fields are full with complaints of blurred vision in left eye  left peripheral field (chronic for 2 months) Pupils are equal, round, and reactive to light.   III,IV, VI: EOMI without ptosis or diploplia.  V: Facial sensation is symmetric to light touch VII: Facial movement is symmetric resting and smiling VIII: Hearing is intact to voice X: Palate elevates symmetrically XI: Shoulder shrug is symmetric. XII: Tongue protrudes midline without atrophy or fasciculations.   Motor: Tone is normal. Bulk is normal. 5/5 strength was present in bilateral lower extremities.  Bilateral upper extremities elevate antigravity with some pain limited strength assessment due to right > left shoulder pain. (Total right shoulder arthoplasty in August 2023).  Prominent asterixis noted in both upper extremities Sensory: Sensation is symmetric to light touch and temperature in the arms and legs. No extinction to DSS present.  Cerebellar: No overt dysmetria noted  ASSESSMENT/PLAN [name] is a 76 y.o. female with history of HTN, tobacco use, chronic diarrhea, poor PO intake, history of hospitalization in December 2022 for hypovolemic shock related to poor intake, and AKI presenting with dizziness, headache, and blurred vision. EMS noted generalized jerking body movements on arrival, though she was able to walk during these movements. On arrival patient was significantly hypotensive with SBP of 60-70s. TNK was not administered due to patient presenting outside of the thrombolytic therapy time window. CTP showed hypoperfusion in the right cerebral hemisphere correlating with significant stenosis of the right innominate. Patient was felt to have cerebral hypoperfusion versus toxic-metabolic encephalopathy with subsequent asterixis versus myoclonic jerking movements in the setting of deranged renal function. 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    Stroke:  right parietal infarct. 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 ASPECTS of 10. CTA head & neck motion degraded. Negative CTA without acute LVO. 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 Diffuse tortuosity of the major arterial vasculature of the head and neck, suggesting chronic underlying hypertension.  CT perfusion No evidence for acute core infarction. 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  2D Echo LVEF 60-65%, left atrial size is moderately dilated. No atrial level shunt detected.  EEG 11/2 with epileptogenicity arising from the right occipital region, cortical dysfunction in the right hemisphere likely secondary to underlying structural abnormality, postictal state. No seizures noted. May need loop recorder at discharge  LDL 76 HgbA1c 4.8 VTE prophylaxis - Lovenox SQ    Diet   Diet NPO time specified Except for: Sips with Meds   No antithrombotic prior to admission, now on aspirin 81 mg and Plavix 75 mg daily x 3 weeks and then aspirin alone.  Therapy recommendations:  Home Health OT, Home health SLP, Home health PT Disposition:  pending   Concern for possible seizure EEG as above with possible epileptogenicity from right occipital region and cortical dysfunction in the right hemisphere likely secondary to underlying structural abnormality, postictal state. No seizures noted.  No history of seizures  Continue Keppra 500 mg PO  BID Seizure precautions   Hypertension Home meds:  metoprolol  Stable Permissive hypertension (OK if < 220/120) but gradually normalize in 5-7 days Long-term BP goal normotensive  Hypotension with circulatory shock, resolved History of hypovolemic shock related to poor PO intake  Encourage adequate PO intake  Required pressors briefly on hospital arrival   Hyperlipidemia Home meds:  atorvastatin 20 mg PO, resumed in  hospital LDL 76, goal < 70 High intensity statin not indicated as LDL is near goal on current dose  Continue statin at discharge 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  Recommend   aspirin alone due to thrombocytopenia with platelet count 83,000.  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  Have consulted interventional neuroradiology for the same.    Aggressive risk factor modification.   Added Depakote to Keppra for electrographic seizures.  Continue long-term EEG monitoring overnight.  No family available at the bedside for discussion.  Greater than 50% time during this 50-minute visit was spent in counseling and coordination of care about her stroke as well as discussion about brachiocephalic artery stenosis and discussion about revascularization and answering questions  Delia Heady, MD Medical Director [facility] Pager: [phone] 01/06/2022 2:42 PM  To contact Stroke Continuity provider, please refer to [email or web address]. After hours, contact General Neurology

## 2022-01-06 NOTE — Progress Notes (Signed)
PT Cancellation Note  Patient Details Name: [name] MRN: [id number] DOB: 10-13-1945   Cancelled Treatment:    Reason Eval/Treat Not Completed: Patient at procedure or test/unavailable (pt getting EEG and plan to transfer to [facility] once study is over. Will follow up at later date/time as schedule allows and pt able.)  Gwynneth Albright PT, [place] [facility] [phone]  01/06/22 1:04 PM

## 2022-01-06 NOTE — Progress Notes (Signed)
Patient ID: Wanda Valdez, female   DOB: 1945-12-11, 76 y.o.   MRN: [id number]   Pt scheduled for Cerebral arteriogram today  Is having a continuous EEG til MN tonight---- will plan for Cerebral 11/8 per Dr Estanislado Pandy

## 2022-01-06 NOTE — Consult Note (Signed)
Chief Complaint: Patient was seen in consultation today for Cerebral arteriogram Chief Complaint  Patient presents with   Code Stroke   at the request of Dr Lina SayreP Sethi   Supervising Physician: Julieanne Cottoneveshwar, Sanjeev  Patient Status: [facility] - In-pt  History of Present Illness: Wanda Valdez is a 76 y.o. 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  CT 11/2: T PERFUSION IMPRESSION: 1. No evidence for acute core infarct. 2. 30 mL area of delayed perfusion within the right cerebral hemisphere, watershed in distribution. 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  Scheduled today for Cerebral arteriogram per Dr Pearlean BrownieSethi request Dr Corliss Skainseveshwar has reviewed imaging and approves procedure  Past Medical History:  Diagnosis Date   Hypertension     Past Surgical History:  Procedure Laterality Date   ABDOMINAL HYSTERECTOMY     BIOPSY  09/11/2020   Procedure: BIOPSY;  Surgeon: Kathi DerBrahmbhatt, Parag, MD;  Location: [facility];  Service: Gastroenterology;;   BIOPSY  09/13/2020   Procedure: BIOPSY;  Surgeon: Kathi DerBrahmbhatt, Parag, MD;  Location: [facility];  Service: Gastroenterology;;   BIOPSY  01/29/2021   Procedure: BIOPSY;  Surgeon: Vida RiggerMagod, Marc, MD;  [facility];  Service: Endoscopy;;   COLONOSCOPY N/A 09/13/2020   Procedure: COLONOSCOPY;  Surgeon: Kathi DerBrahmbhatt, Parag, MD;  Location: [facility];  Service: Gastroenterology;  Laterality: N/A;   ESOPHAGOGASTRODUODENOSCOPY N/A 09/11/2020   Procedure:  ESOPHAGOGASTRODUODENOSCOPY (EGD);  Surgeon: Kathi DerBrahmbhatt, Parag, MD;  [facility];  Service: Gastroenterology;  Laterality: N/A;   ESOPHAGOGASTRODUODENOSCOPY (EGD) WITH PROPOFOL N/A 01/29/2021   Procedure: ESOPHAGOGASTRODUODENOSCOPY (EGD) WITH PROPOFOL;  Surgeon: Vida RiggerMagod, Marc, MD;  [facility];  Service: Endoscopy;  Laterality: N/A;  With dilation as well and will need ultraslim endoscope in the room   POLYPECTOMY  09/13/2020   Procedure: POLYPECTOMY;  Surgeon: Kathi DerBrahmbhatt, Parag, MD;  [facility];  Service: Gastroenterology;;   Gaspar BiddingSAVORY DILATION N/A 01/29/2021   Procedure: Gaspar BiddingSAVORY DILATION;  Surgeon: Vida RiggerMagod, Marc, MD;  [facility];  Service: Endoscopy;  Laterality: N/A;    Allergies: Patient has no known allergies.  Medications: Prior to Admission medications   Medication Sig Start Date End Date Taking? Authorizing Provider  alendronate (FOSAMAX) 35 MG tablet Take 35 mg by mouth once a week. 03/28/20  Yes [provider]  allopurinol (ZYLOPRIM) 100 MG tablet Take 100 mg by mouth daily. 08/10/20  Yes [provider]  atorvastatin (LIPITOR) 20 MG tablet Take 20 mg by mouth daily. 10/01/20  Yes [provider]  cholestyramine (QUESTRAN) 4 g packet Take 1 packet by mouth 2 (two) times daily. 10/10/20  Yes [provider]  CREON 6000-19000 units CPEP Take 2 capsules by mouth 3 (three) times daily. 05/11/20  Yes [provider]  diphenoxylate-atropine (LOMOTIL) 2.5-0.025 MG tablet Take 1 tablet by mouth 2 (two) times daily as needed. 12/16/20  Yes [provider]  famotidine (PEPCID) 40 MG tablet Take 40 mg by mouth daily. 10/01/20  Yes [provider]  loperamide (IMODIUM) 2 MG capsule Take 4 mg by mouth daily as needed  for diarrhea or loose stools.   Yes [provider]  metoprolol tartrate (LOPRESSOR) 25 MG tablet Take 25 mg by mouth daily. 01/01/21  Yes [provider]  Multiple Vitamin  (MULTIVITAMIN WITH MINERALS) TABS tablet Take 1 tablet by mouth daily. 09/14/20  Yes Pahwani, Rinka R, MD  pantoprazole (PROTONIX) 40 MG tablet Take 1 tablet (40 mg total) by mouth daily. 09/14/20  Yes Pahwani, Rinka R, MD  sucralfate (CARAFATE) 1 g tablet Take 1 g by mouth 3 (three) times daily. Dissolve 1 tablet in 2 ounces of water 05/27/20  Yes [provider]  EPINEPHrine 0.3 mg/0.3 mL IJ SOAJ injection SMARTSIG:0.3 Milliliter(s) IM Once PRN 10/01/20   [provider]     Family History  Problem Relation Age of Onset   Cirrhosis Mother    Heart attack Father     Social History   Socioeconomic History   Marital status: Divorced    Spouse name: Not on file   Number of children: Not on file   Years of education: Not on file   Highest education level: Not on file  Occupational History   Not on file  Tobacco Use   Smoking status: Some Days    Types: Cigarettes   Smokeless tobacco: Never  Vaping Use   Vaping Use: Never used  Substance and Sexual Activity   Alcohol use: Yes    Comment: occassional   Drug use: Never   Sexual activity: Not on file  Other Topics Concern   Not on file  Social History Narrative   Not on file   Social Determinants of Health   Financial Resource Strain: Not on file  Food Insecurity: Not on file  Transportation Needs: Not on file  Physical Activity: Not on file  Stress: Not on file  Social Connections: Not on file   Review of Systems: A 12 point ROS discussed and pertinent positives are indicated in the HPI above.  All other systems are negative.  Review of Systems  Constitutional:  Positive for activity change and fatigue. Negative for fever.  HENT:  Negative for tinnitus and trouble swallowing.   Eyes:  Negative for visual disturbance.  Respiratory:  Negative for cough and shortness of breath.   Cardiovascular:  Negative for chest pain and leg swelling.  Gastrointestinal:  Negative for abdominal pain.  Musculoskeletal:   Negative for back pain.  Neurological:  Positive for seizures, weakness and headaches. Negative for dizziness, tremors, syncope, facial asymmetry, speech difficulty, light-headedness and numbness.  Psychiatric/Behavioral:  Positive for confusion. Negative for agitation and behavioral problems.     Vital Signs: BP 125/86   Pulse 64   Temp 98.4 F (36.9 C) (Oral)   Resp 18   Ht 4\' 10"  (1.473 m)   Wt 91 lb 11.4 oz (41.6 kg)   SpO2 96%   BMI 19.17 kg/m     Physical Exam Vitals reviewed.  HENT:     Mouth/Throat:     Mouth: Mucous membranes are moist.  Eyes:     Extraocular Movements: Extraocular movements intact.  Cardiovascular:     Rate and Rhythm: Normal rate and regular rhythm.  Pulmonary:     Effort: Pulmonary effort is normal.     Breath sounds: Normal breath sounds.  Abdominal:     Palpations: Abdomen is soft.     Tenderness: There is no abdominal tenderness.  Musculoskeletal:        General: Normal range of motion.     Cervical back:  Normal range of motion.  Skin:    General: Skin is warm.  Neurological:     Mental Status: She is alert.     Comments: Pleasantly confused Able to tell me name and dob Confused to place and time  Psychiatric:     Comments: Consented with Dr Alene Mires via phone     Imaging: EEG adult  Result Date: 01/05/2022 Wanda Quest, MD     01/05/2022  5:55 PM Patient Name: Wanda Valdez MRN: [id number] Epilepsy Attending: Charlsie Valdez Referring Physician/Provider: Kara Mead, NP Date: 01/05/2022 Duration: 22.41 mins Patient history: 76 year old female with jerking episodes.  EEG to evaluate for seizure. Level of alertness: Awake AEDs during EEG study: LEV Technical aspects: This EEG study was done with scalp electrodes positioned according to the 10-20 International system of electrode placement. Electrical activity was reviewed with band pass filter of 1-70Hz , sensitivity of 7 uV/mm, display speed of 25mm/sec with a 60Hz   notched filter applied as appropriate. EEG data were recorded continuously and digitally stored.  Video monitoring was available and reviewed as appropriate. Description: No posterior dominant rhythm was seen. EEG showed 6-9Hz  alpha activity in left hemisphere and continuous 3-5 hz theta-delta slowing in right hemisphere . Lateralized periodic discharges with overriding fast activity at 1hz  were noted in right hemisphere, maximal right occipital region which at times appear rhythmic lasting 2-9 seconds consistent with brief-ictal-interictal rhythmic discharges   ABNORMALITY - Brief-ictal-interictal rhythmic discharges, right hemisphere, maximal right occipital region - Lateralized periodic discharges with overriding fast activity, ( LPD +F), right hemisphere, maximal right occipital region -Continuous slow, generalized and lateralized right hemisphere  IMPRESSION: This study showed evidence of epileptogenicity arising from right hemisphere, maximal right occipital region which is on the ictal-interictal continuum with increased risk of seizure recurrence.  Additionally there is cortical dysfunction in right hemisphere likely secondary to underlying structural abnormality. Lastly there is moderate diffuse encephalopathy, non specific etiology.  Recommend long term eeg for further evaluation  Wanda   MR BRAIN WO CONTRAST  Result Date: 01/04/2022 CLINICAL DATA:  Neuro deficit and altered mental status EXAM: MRI HEAD WITHOUT CONTRAST TECHNIQUE: Multiplanar, multiecho pulse sequences of the brain and surrounding structures were obtained without intravenous contrast. COMPARISON:  Head CT and CTA from 3 days ago FINDINGS: Brain: Generalized mild restricted diffusion involving the right parietal cortex with 2 more discrete punctate infarcts at the right postcentral gyrus. No acute hemorrhage, hydrocephalus, or mass effect. Suspected tiny (5 mm) meningioma in the right posterior fossa anterior to the  cerebellar hemisphere. Vascular: Major flow voids are preserved Skull and upper cervical spine: Rounded hypointense areas in the biparietal calvarium which have a benign appearance by CT and there is no reported history of malignancy. Sinuses/Orbits: Secretions layer in the left sphenoid sinus. Bilateral cataract resection. 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 Electronically Signed   By: Wanda Valdez M.D.   On: 01/04/2022 11:13   ECHOCARDIOGRAM COMPLETE  Result Date: 01/02/2022    ECHOCARDIOGRAM REPORT   Patient Name:   Wanda Valdez Date of Exam: 01/02/2022 Medical Rec #:  Wanda Valdez          Height:       58.0 in Accession #:    13/04/2021         Weight:       91.7 lb Date of Birth:  06-02-1945          BSA:          1.307 m Patient Age:    76 years           BP:           126/90 mmHg Patient Gender: F                  HR:           62 bpm. Exam Location:  Inpatient Procedure: 2D Echo, Cardiac Doppler and Color Doppler Indications:    Stroke  History:        Patient has prior history of Echocardiogram examinations, most                 recent 09/20/2020. Risk Factors:Hypertension.  Sonographer:    Gaynell Face Referring Phys: [id number] ARSHAD N KAKRAKANDY IMPRESSIONS  1. Left ventricular ejection fraction, by estimation, is 60 to 65%. The left ventricle has normal function. The left ventricle has no regional wall motion abnormalities. Left ventricular diastolic parameters are consistent with Grade I diastolic dysfunction (impaired relaxation).  2. Right ventricular systolic function is normal. The right ventricular size is normal.  3. Left atrial size was moderately dilated.  4. The mitral valve is normal in structure. Trivial mitral valve regurgitation. No evidence of mitral stenosis.  5. The aortic valve was not well visualized. Aortic valve regurgitation is not  visualized. No aortic stenosis is present.  6. Pulmonic valve regurgitation not well visualized. FINDINGS  Left Ventricle: Left ventricular ejection fraction, by estimation, is 60 to 65%. The left ventricle has normal function. The left ventricle has no regional wall motion abnormalities. The left ventricular internal cavity size was normal in size. There is  no left ventricular hypertrophy. Left ventricular diastolic parameters are consistent with Grade I diastolic dysfunction (impaired relaxation). Right Ventricle: The right ventricular size is normal. Right ventricular systolic function is normal. Left Atrium: Left atrial size was moderately dilated. Right Atrium: Right atrial size was normal in size. Pericardium: There is no evidence of pericardial effusion. Mitral Valve: The mitral valve is normal in structure. Mild mitral annular calcification. Trivial mitral valve regurgitation. No evidence of mitral valve stenosis. Tricuspid Valve: The tricuspid valve is normal in structure. Tricuspid valve regurgitation is trivial. No evidence of tricuspid stenosis. Aortic Valve: The aortic valve was not well visualized. Aortic valve regurgitation is not visualized. No aortic stenosis is present. Aortic valve mean gradient measures 3.0 mmHg. Aortic valve peak gradient measures 5.1 mmHg. Pulmonic Valve: The pulmonic valve was not well visualized. Pulmonic valve regurgitation not well visualized. Aorta: The aortic root is normal in size and structure. Venous: The inferior vena cava was not well visualized. IAS/Shunts: No atrial level shunt detected by color flow Doppler.  LEFT VENTRICLE PLAX 2D LVIDd:         3.90 cm   Diastology LVIDs:         2.60 cm   LV e' medial:    5.59 cm/s LV PW:         1.00 cm   LV E/e' medial:  12.5 LV IVS:        1.00 cm   LV e' lateral:   8.70 cm/s LVOT diam:     2.00 cm   LV E/e' lateral: 8.0 LVOT Area:     3.14 cm  RIGHT VENTRICLE TAPSE (M-mode): 1.7 cm LEFT ATRIUM  Index         RIGHT ATRIUM          Index LA Vol (A2C):   57.0 ml 43.60 ml/m  RA Area:     8.38 cm LA Vol (A4C):   57.6 ml 44.05 ml/m  RA Volume:   13.40 ml 10.25 ml/m LA Biplane Vol: 59.8 ml 45.74 ml/m  AORTIC VALVE AV Vmax:      113.00 cm/s AV Vmean:     77.200 cm/s AV VTI:       0.260 m AV Peak Grad: 5.1 mmHg AV Mean Grad: 3.0 mmHg  AORTA Ao Root diam: 3.20 cm MITRAL VALVE MV Area (PHT): 3.03 cm    SHUNTS MV Decel Time: 250 msec    Systemic Diam: 2.00 cm MV E velocity: 69.60 cm/s MV A velocity: 89.20 cm/s MV E/A ratio:  0.78 Wanda Ruths MD Electronically signed by Wanda Ruths MD Signature Date/Time: 01/02/2022/1:12:09 PM    Final    EEG adult  Result Date: 01/02/2022 Wanda Havens, MD     01/05/2022  5:50 PM Patient Name: Wanda Valdez MRN: [id number] Epilepsy Attending: Lora Valdez Referring Physician/Provider: Amie Portland, MD Date: 01/01/2022 Duration: 23.14 mins Patient history: 76 year old female with jerking episodes.  EEG to evaluate for seizure. Level of alertness: Awake AEDs during EEG study: Ativan, LEV Technical aspects: This EEG study was done with scalp electrodes positioned according to the 10-20 International system of electrode placement. Electrical activity was reviewed with band pass filter of 1-70Hz , sensitivity of 7 uV/mm, display speed of 65mm/sec with a 60Hz  notched filter applied as appropriate. EEG data were recorded continuously and digitally stored.  Video monitoring was available and reviewed as appropriate. Description: The posterior dominant rhythm consists of 7.5 Hz activity of moderate voltage (25-35 uV) seen predominantly in posterior head regions, symmetric and reactive to eye opening and eye closing.  EEG showed continuous 3-5 hz theta-delta slowing in right hemisphere.  Sharp waves were noted in right occipital region which at times appeared periodic with fluctuating frequency of 0.25 to 0.5 Hz ABNORMALITY -Sharp wave, right occipital region -Continuous slow, right  hemisphere IMPRESSION: This study showed evidence of epileptogenicity arising from right occipital region.  Additionally there is cortical dysfunction in right hemisphere likely secondary to underlying structural abnormality, postictal state.  No seizures were seen during the study. Wanda Valdez   CT RENAL STONE STUDY  Result Date: 01/02/2022 CLINICAL DATA:  Flank pain.  Kidney stone suspected. EXAM: CT ABDOMEN AND PELVIS WITHOUT CONTRAST TECHNIQUE: Multidetector CT imaging of the abdomen and pelvis was performed following the standard protocol without IV contrast. RADIATION DOSE REDUCTION: This exam was performed according to the departmental dose-optimization program which includes automated exposure control, adjustment of the mA and/or kV according to patient size and/or use of iterative reconstruction technique. COMPARISON:  CTA chest, abdomen and pelvis 09/19/2020. FINDINGS: Lower chest: There are small layering pleural effusions and posterior basal linear atelectasis. Lung bases are otherwise clear. There is mild cardiomegaly. No pericardial effusion. There are coronary artery calcifications, tortuosity and heavy calcification of the descending thoracic aorta. Hepatobiliary: Unremarkable unenhanced liver parenchyma. Surgically absent gallbladder as before. No biliary prominence. Pancreas: Mildly prominent pancreatic duct in the head, neck and body segments, unchanged. No other significant findings in the unenhanced pancreas. Spleen: Unremarkable without contrast. Adrenals/Urinary Tract: There is no adrenal mass. There is a 3.4 cm homogeneous thin walled cyst of the upper pole of the left kidney of 15.6 Hounsfield units.  No follow-up is recommended. There are additional subcentimeter too small to characterize hypodensities in both kidneys which are also unchanged. Both kidneys show mild atrophic change. Bilateral perinephric stranding is similar. There is no asymmetric or contrast retention bilaterally  but there is mild hydroureteronephrosis on the left. There is no appreciable filling defect but a small stone would be missed due to dense contrast in the collecting systems, ureters and bladder. The right renal collecting system and ureter are decompressed. The bladder is unremarkable. Stomach/Bowel: Unremarkable unopacified stomach and small bowel. An appendix is not seen in this patient. There is wall prominence versus nondistention in the ascending and proximal transverse colon. Scattered diverticulosis with the remainder of the large bowel wall is normal thickness. Vascular/Lymphatic: There is extensive aortoiliac and branch vessel atherosclerotic versus. No AAA. No adenopathy. Reproductive: Status post hysterectomy. No adnexal masses. There are numerous pelvic phleboliths. Other: Limited exam due to metallic artifact from multiple overlying wires and due to beam hardening from the patient's right arm in the field. There is trace ascites in the presacral space. There is no free air, free hemorrhage or free fluid Musculoskeletal: Osteopenia, moderate thoracolumbar dextroscoliosis, and degenerative changes. There is advanced facet hypertrophy at L5-S1 with grade 1 degenerative anterolisthesis. No worrisome regional bone lesion. IMPRESSION: 1. Mild left hydroureteronephrosis but no appreciable stone or filling defect is seen. Findings could be due to a recently passed stone or ascending UTI. 2. A small obstructing stone could be obscured due to dense contrast in the collecting systems, ureters and bladder. The patient had a CTA head yesterday. 3. Bilateral mild renal atrophy with chronic perinephric stranding. 4. Ascending and proximal transverse colitis versus nondistention. 5. Diverticulosis. 6. Aortic and coronary artery atherosclerosis. Cardiomegaly. 7. Small pleural effusions. 8. Trace presacral ascites. 9. Osteopenia, scoliosis and degenerative change. Aortic Atherosclerosis (ICD10-I70.0). Electronically  Signed   By: Almira Bar M.D.   On: 01/02/2022 04:16   CT ANGIO HEAD NECK W WO CM W PERF (CODE STROKE)  Result Date: 01/01/2022 CLINICAL DATA:  Initial evaluation for neuro deficit, stroke suspected. EXAM: CT ANGIOGRAPHY HEAD AND NECK CT PERFUSION BRAIN TECHNIQUE: Multidetector CT imaging of the head and neck was performed using the standard protocol during bolus administration of intravenous contrast. Multiplanar CT image reconstructions and MIPs were obtained to evaluate the vascular anatomy. Carotid stenosis measurements (when applicable) are obtained utilizing NASCET criteria, using the distal internal carotid diameter as the denominator. Multiphase CT imaging of the brain was performed following IV bolus contrast injection. Subsequent parametric perfusion maps were calculated using RAPID software. RADIATION DOSE REDUCTION: This exam was performed according to the departmental dose-optimization program which includes automated exposure control, adjustment of the mA and/or kV according to patient size and/or use of iterative reconstruction technique. CONTRAST:  75mL OMNIPAQUE IOHEXOL 350 MG/ML SOLN COMPARISON:  Head CT from earlier the same day. FINDINGS: CTA NECK FINDINGS Aortic arch: Examination moderately to severely degraded by motion artifact. Aortic arch within normal limits for caliber. Moderate to advanced atheromatous change about the arch and origin of the great vessels. 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 Right carotid system: Right common and internal carotid arteries are tortuous without dissection or occlusion. Atheromatous change about the right carotid bulb without hemodynamically significant greater than 50% stenosis. Left carotid system: Left common and internal carotid arteries are tortuous without dissection or occlusion. Atheromatous change about the left carotid bulb without hemodynamically significant  greater  than 50% stenosis. Vertebral arteries: Both vertebral arteries arise from subclavian arteries. No other proximal subclavian artery stenosis. Vertebral arteries are tortuous without visible dissection, stenosis or occlusion. Skeleton: No visible discrete or worrisome osseous lesions. Exaggeration of the normal cervical lordosis noted. Other neck: No other visible soft tissue abnormality within the neck. Upper chest: Visualized upper chest demonstrates no acute finding. Review of the MIP images confirms the above findings CTA HEAD FINDINGS Anterior circulation: Evaluation of the intracranial circulation limited by motion artifact. /petrous segments patent bilaterally. Atheromatous change within the carotid siphons without definite high-grade stenosis. A1 segments patent. Grossly normal anterior communicating artery complex. Both anterior cerebral arteries grossly patent to their distal aspects without visible stenosis. M1 segments patent bilaterally. No visible proximal MCA branch occlusion. Distal MCA branches grossly perfused and symmetric. Posterior circulation: Both V4 segments patent without visible stenosis. Both PICA patent. Basilar grossly patent to its distal aspect without stenosis. Superior cerebral arteries grossly patent bilaterally. Both PCAs grossly patent to their distal aspects without visible stenosis or occlusion. Probable fetal type origin of the left PCA. Venous sinuses: Patent allowing for timing the contrast bolus and motion. Anatomic variants: As above. Review of the MIP images confirms the above findings CT Brain Perfusion Findings: ASPECTS: 10 CBF (<30%) Volume: 0mL Perfusion (Tmax>6.0s) volume: 30mL Mismatch Volume: 30mL Infarction Location:Negative CT perfusion for acute core infarct. 30 cc delayed perfusion within the right cerebral hemisphere, watershed in distribution. 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 IMPRESSION:  CTA HEAD AND NECK IMPRESSION: 1. Motion degraded exam. 2. Negative CTA, with no visible acute large vessel occlusion. 3. 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 4. Mild-to-moderate atheromatous change elsewhere about the major arterial vasculature of the head and neck. No other visible proximal high-grade or correctable stenosis. 5. Diffuse tortuosity of the major arterial vasculature of the head and neck, suggesting chronic underlying hypertension. CT PERFUSION IMPRESSION: 1. No evidence for acute core infarct. 2. 30 mL area of delayed perfusion within the right cerebral hemisphere, watershed in distribution. 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 Case discussed by telephone at the time of interpretation on 01/01/2022 at 7:25 p.m. with provider ASHISH ARORA. Electronically Signed   By: Rise Mu M.D.   On: 01/01/2022 20:21   DG Chest Port 1 View  Result Date: 01/01/2022 CLINICAL DATA:  Weakness EXAM: PORTABLE CHEST 1 VIEW COMPARISON:  10/15/2021 FINDINGS: Lungs are clear. No pneumothorax or pleural effusion. 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 Cardiac size is within normal limits. Pulmonary vascularity is normal. Right total shoulder arthroplasty has been performed. No acute bone abnormality. IMPRESSION: 1. No active disease. 2. 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 This could be better assessed with a standard two view chest radiograph or CT imaging if indicated. Electronically Signed   By: Helyn Numbers M.D.   On: 01/01/2022 19:53   CT HEAD CODE STROKE WO CONTRAST  Result Date: 01/01/2022 CLINICAL DATA:  Code stroke. Initial evaluation for neuro deficit, headache with blurry vision. EXAM: CT HEAD WITHOUT CONTRAST TECHNIQUE:  Contiguous axial images were obtained from the base of the skull through the vertex without intravenous contrast. RADIATION DOSE REDUCTION: This exam was performed according to the departmental dose-optimization program which includes automated  exposure control, adjustment of the mA and/or kV according to patient size and/or use of iterative reconstruction technique. COMPARISON:  None available. FINDINGS: Brain: Examination moderately degraded by motion artifact. No acute intracranial hemorrhage. No visible acute large vessel territory infarct. No mass lesion, midline shift or mass effect. No hydrocephalus. No visible extra-axial fluid collection. Vascular: No visible abnormal hyperdense vessel. Skull: Scalp soft tissues and calvarium within normal limits. Sinuses/Orbits: Globes orbital soft tissues demonstrate no acute finding. Mild left sphenoid sinus disease. Paranasal sinuses are otherwise clear. Other: No mastoid effusion. ASPECTS [facility] Stroke Program Early CT Score) - Ganglionic level infarction (caudate, lentiform nuclei, internal capsule, insula, M1-M3 cortex): 7 - Supraganglionic infarction (M4-M6 cortex): 3 Total score (0-10 with 10 being normal): 10 IMPRESSION: 1. Motion degraded exam. No definite acute intracranial abnormality. 2. ASPECTS is 10. These results were communicated to Dr. Wilford Corner at 7:18 pm on 01/01/2022 by text page via the [facility] messaging system. Electronically Signed   By: Rise Mu M.D.   On: 01/01/2022 19:21    Labs:  CBC: Recent Labs    01/01/22 1900 01/01/22 1905 01/02/22 0516 01/03/22 1032 01/06/22 0141  WBC 8.0  --  4.7 4.4 3.2*  HGB 13.2 13.9 11.2* 11.6* 11.3*  HCT 40.6 41.0 33.9* 35.1* 35.1*  PLT 262  --  83* 182 141*    COAGS: Recent Labs    01/26/21 1537 01/01/22 1900 01/06/22 0141  INR 1.1 1.0 1.0  APTT 28 29  --     BMP: Recent Labs    01/01/22 1900 01/01/22 1905 01/02/22 0516 01/03/22 1032 01/05/22 1138 01/06/22 0345  NA 142 139   --  142 139 141  K 5.0 4.8  --  3.2* 3.8 3.7  CL 107 110  --  100 110 114*  CO2 13*  --   --  27 19* 21*  GLUCOSE 215* 211*  --  107* 97 89  BUN 13 10  --  CALCIUM 9.9  --   --  8.7* 8.9 8.7*  CREATININE 1.60* 1.30* 1.16* 1.20* 1.04* 1.08*  GFRNONAA 33*  --  48* 46* 55* 53*    LIVER FUNCTION TESTS: Recent Labs    01/26/21 1537 01/28/21 0848 01/01/22 1900  BILITOT 0.8 0.6 0.7  AST 21 33 31  ALT ALKPHOS 71 98 87  PROT 4.1* 4.7* 6.3*  ALBUMIN 1.9* 2.0* 3.3*    TUMOR MARKERS: No results for input(s): "AFPTM", "CEA", "CA199", "CHROMGRNA" in the last 8760 hours.  Assessment and Plan:  CVA Mild confusion Headache and seizure activity Cerebral artery stenosis on CT Scheduled for cerebral arteriogram today in IR Risks and benefits of cerebral angiogram with intervention were discussed with the patient and Dtr Syniece via phone including, but not limited to bleeding, infection, vascular injury, contrast induced renal failure, stroke or even death.  This interventional procedure involves the use of X-rays and because of the nature of the planned procedure, it is possible that we will have prolonged use of X-ray fluoroscopy.  Potential radiation risks to you include (but are not limited to) the following: - A slightly elevated risk for cancer  several years later in life. This risk is typically less than 0.5% percent. This risk is low in comparison to the normal incidence of human cancer, which is 33% for women and 50% for men according to the [employer] Society. - Radiation induced injury can include skin redness, resembling a rash, tissue breakdown /  ulcers and hair loss (which can be temporary or permanent).   The likelihood of either of these occurring depends on the difficulty of the procedure and whether you are sensitive to radiation due to previous procedures, disease, or genetic conditions.   IF your procedure requires a prolonged use of radiation,  you will be notified and given written instructions for further action.  It is your responsibility to monitor the irradiated area for the 2 weeks following the procedure and to notify your physician if you are concerned that you have suffered a radiation induced injury.    All of the patient's questions were answered, patient and Dtr Syniece are agreeable to proceed.  Consent signed and in chart.  Thank you for this interesting consult.  I greatly enjoyed meeting Ansley Mangiapane and look forward to participating in their care.  A copy of this report was sent to the requesting provider on this date.  Electronically Signed: Robet Leu, PA-C 01/06/2022, 8:13 AM   I spent a total of 40 Minutes    in face to face in clinical consultation, greater than 50% of which was counseling/coordinating care for Cerebral arteriogram

## 2022-01-06 NOTE — Progress Notes (Signed)
[facility] PROGRESS NOTE    Progress Note  Wanda Valdez  YQM:[id number] DOB: 1945/09/14 DOA: 01/01/2022 PCP: Raquel James, MD     Brief Narrative:   Wanda Valdez is an 76 y.o. female past medical history of lymphocytic colitis, essential hypertension with recent right shoulder surgery about a month ago was found to be weak complaining of blurry vision and headache, accompanied episode of diarrhea unable to get out of bed the day prior to admission comes into the ED code stroke was called as he was also found hypotensive in the ED fluid resuscitated started on Levophed, empiric antibiotics, CT of the head was unremarkable CT perfusion study showed hypoperfusion to the right cerebral hemisphere with a stenosis of the right innominate artery.   Assessment/Plan:   Circulatory shock: Required pressors temporarily  for couple of hours.  Of unclear etiology. Question due to seizures. Blood pressure has remained stable. Culture data has remained negative till date discontinue antibiotics. EEG showed possible seizures.  Acute CVA: MRI of the brain showed an acute cortical infarct of the right parietal region. HgbA1c 8, fasting lipid panel LDL 76. 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 PT, OT evaluated the patient recommended home health. Transthoracic Echo, EF of 55% grade 1 diastolic dysfunction. She was previously not on aspirin and started on aspirin. On statins. BP goal: permissive HTN upto 220/120 mmHg No events telemetry monitoring. Neurology was consulted recommended aspirin and elective revascularization with angioplasty. 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 EEG showed evidence of seizures neurology was consulted she was started on  AEDs  Acute confusional state: She has  no diagnosis of dementia, spoken to her daughter her mentation fluctuates throughout the day. Continue Haldol and Seroquel as needed as needed. Try to minimize medication (once daily) at night. She is at high risk of aspiration and agitation.  Acute kidney injury: With a baseline creatinine of less than 1 on admission 1.3 . Basic metabolic panel is pending.  Thrombocytopenia: Normally she is greater than 150,  LDH 154. Resolved  Macrocytic anemia: CBC shows a hemoglobin at baseline, anemia panel showed ferritin 55 will need further evaluation as an out patient on colonoscopy, will start on ferrous sulfate upon discharge. Folate of 40  Diffuse abdominal tenderness: CT of the abdomen pelvis CT of the abdomen showed mild left hydronephrosis no stones, mild bilateral renal atrophy with chronic perinephric stranding,  transverse colitis, small pleural effusions and trace ascites.  No acute abnormalities to explain her diffuse tenderness. Resume Creon. FOBT stools negative.  Hyperglycemia: Likely reactive A1c of 4.8.  Discontinue all insulins.  Essential hypertension: Blood pressure is running up   DVT prophylaxis: Lovenox Family Communication:none Status is: Inpatient Remains inpatient appropriate because: Shock    Code Status:     Code Status Orders  (From admission, onward)           Start     Ordered   01/02/22 0310  Full code  Continuous        01/02/22 0310           Code Status History     Date Active Date Inactive Code Status Order ID Comments User Context   01/26/2021 1846 01/30/2021 2111 Full Code [id number]  Lupita Leash, MD Inpatient   09/19/2020 2142 09/22/2020 1733 Full Code [id number]  Julian Reil,  Heywood Iles, DO ED   09/10/2020 1849 09/13/2020 2020 Full Code [id number]  Rodolph Bong, MD Inpatient         IV Access:   Peripheral IV   Procedures and diagnostic studies:   EEG adult  Result Date: 01/05/2022 Charlsie Quest, MD      01/05/2022  5:55 PM Patient Name: Wanda Valdez MRN: [id number] Epilepsy Attending: Charlsie Quest Referring Physician/Provider: Kara Mead, NP Date: 01/05/2022 Duration: 22.41 mins Patient history: 76 year old female with jerking episodes.  EEG to evaluate for seizure. Level of alertness: Awake AEDs during EEG study: LEV Technical aspects: This EEG study was done with scalp electrodes positioned according to the 10-20 International system of electrode placement. Electrical activity was reviewed with band pass filter of 1-70Hz , sensitivity of 7 uV/mm, display speed of 4mm/sec with a 60Hz  notched filter applied as appropriate. EEG data were recorded continuously and digitally stored.  Video monitoring was available and reviewed as appropriate. Description: No posterior dominant rhythm was seen. EEG showed 6-9Hz  alpha activity in left hemisphere and continuous 3-5 hz theta-delta slowing in right hemisphere . Lateralized periodic discharges with overriding fast activity at 1hz  were noted in right hemisphere, maximal right occipital region which at times appear rhythmic lasting 2-9 seconds consistent with brief-ictal-interictal rhythmic discharges   ABNORMALITY - Brief-ictal-interictal rhythmic discharges, right hemisphere, maximal right occipital region - Lateralized periodic discharges with overriding fast activity, ( LPD +F), right hemisphere, maximal right occipital region -Continuous slow, generalized and lateralized right hemisphere  IMPRESSION: This study showed evidence of epileptogenicity arising from right hemisphere, maximal right occipital region which is on the ictal-interictal continuum with increased risk of seizure recurrence.  Additionally there is cortical dysfunction in right hemisphere likely secondary to underlying structural abnormality. Lastly there is moderate diffuse encephalopathy, non specific etiology.  Recommend long term eeg for further evaluation  Priyanka   MR BRAIN  WO CONTRAST  Result Date: 01/04/2022 CLINICAL DATA:  Neuro deficit and altered mental status EXAM: MRI HEAD WITHOUT CONTRAST TECHNIQUE: Multiplanar, multiecho pulse sequences of the brain and surrounding structures were obtained without intravenous contrast. COMPARISON:  Head CT and CTA from 3 days ago FINDINGS: Brain: Generalized mild restricted diffusion involving the right parietal cortex with 2 more discrete punctate infarcts at the right postcentral gyrus. No acute hemorrhage, hydrocephalus, or mass effect. Suspected tiny (5 mm) meningioma in the right posterior fossa anterior to the cerebellar hemisphere. Vascular: Major flow voids are preserved Skull and upper cervical spine: Rounded hypointense areas in the biparietal calvarium which have a benign appearance by CT and there is no reported history of malignancy. Sinuses/Orbits: Secretions layer in the left sphenoid sinus. Bilateral cataract resection. 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 Electronically Signed   By: Annabelle Harman M.D.   On: 01/04/2022 11:13     Medical Consultants:   None.   Subjective:    Wanda Valdez has noted complaints this morning  Objective:    Vitals:   01/05/22 0930 01/05/22 1733 01/05/22 2241 01/06/22 0455  BP: 126/86 (!) 148/91 (!) 149/76 125/86  Pulse: 62 (!) 58 (!) 54 64  Resp:   18 18  Temp: 98.1 F (36.7 C) 98.5 F (36.9 C) 98.1 F (36.7 C) 98.4 F (36.9 C)  TempSrc: Oral Oral Oral Oral  SpO2: 100% 100% 98% 96%  Weight:  Height:       SpO2: 96 % O2 Flow Rate (L/min): 3 L/min   Intake/Output Summary (Last 24 hours) at 01/06/2022 0826 Last data filed at 01/05/2022 1243 Gross per 24 hour  Intake --  Output 40 ml  Net -40 ml    Filed Weights   01/01/22 1903  Weight: 41.6 kg    Exam: General exam: In no acute distress. Respiratory  system: Good air movement and clear to auscultation. Cardiovascular system: S1 & S2 heard, RRR. No JVD. Gastrointestinal system: Abdomen is nondistended, soft and nontender.  Extremities: No pedal edema. Skin: No rashes, lesions or ulcers Psychiatry: Judgement and insight appear normal. Mood & affect appropriate. Data Reviewed:    Labs: Basic Metabolic Panel: Recent Labs  Lab 01/01/22 1900 01/01/22 1905 01/02/22 0516 01/03/22 1032 01/05/22 1138 01/06/22 0345  NA 142 139  --  142 139 141  K 5.0 4.8  --  3.2* 3.8 3.7  CL 107 110  --  100 110 114*  CO2 13*  --   --  27 19* 21*  GLUCOSE 215* 211*  --  107* 97 89  BUN 13 10  --  14 8 9   CREATININE 1.60* 1.30* 1.16* 1.20* 1.04* 1.08*  CALCIUM 9.9  --   --  8.7* 8.9 8.7*    GFR Estimated Creatinine Clearance: 27.7 mL/min (A) (by C-G formula based on SCr of 1.08 mg/dL (H)). Liver Function Tests: Recent Labs  Lab 01/01/22 1900  AST 31  ALT 15  ALKPHOS 87  BILITOT 0.7  PROT 6.3*  ALBUMIN 3.3*    No results for input(s): "LIPASE", "AMYLASE" in the last 168 hours. Recent Labs  Lab 01/01/22 1950  AMMONIA 33    Coagulation profile Recent Labs  Lab 01/01/22 1900 01/06/22 0141  INR 1.0 1.0    COVID-19 Labs  No results for input(s): "DDIMER", "FERRITIN", "LDH", "CRP" in the last 72 hours.   Lab Results  Component Value Date   SARSCOV2NAA NEGATIVE 01/26/2021   SARSCOV2NAA NEGATIVE 09/19/2020   SARSCOV2NAA NEGATIVE 09/10/2020    CBC: Recent Labs  Lab 01/01/22 1900 01/01/22 1905 01/02/22 0516 01/03/22 1032 01/06/22 0141  WBC 8.0  --  4.7 4.4 3.2*  NEUTROABS 3.0  --   --  2.7  --   HGB 13.2 13.9 11.2* 11.6* 11.3*  HCT 40.6 41.0 33.9* 35.1* 35.1*  MCV 104.6*  --  102.4* 101.2* 103.5*  PLT 262  --  83* 182 141*    Cardiac Enzymes: No results for input(s): "CKTOTAL", "CKMB", "CKMBINDEX", "TROPONINI" in the last 168 hours. BNP (last 3 results) No results for input(s): "PROBNP" in the last 8760  hours. CBG: Recent Labs  Lab 01/05/22 0745 01/05/22 1117 01/05/22 1545 01/05/22 2239 01/06/22 0724  GLUCAP 77 96 154* 106* 90    D-Dimer: No results for input(s): "DDIMER" in the last 72 hours. Hgb A1c: No results for input(s): "HGBA1C" in the last 72 hours.  Lipid Profile: No results for input(s): "CHOL", "HDL", "LDLCALC", "TRIG", "CHOLHDL", "LDLDIRECT" in the last 72 hours.  Thyroid function studies: No results for input(s): "TSH", "T4TOTAL", "T3FREE", "THYROIDAB" in the last 72 hours.  Invalid input(s): "FREET3"  Anemia work up: No results for input(s): "VITAMINB12", "FOLATE", "FERRITIN", "TIBC", "IRON", "RETICCTPCT" in the last 72 hours.  Sepsis Labs: Recent Labs  Lab 01/01/22 1900 01/01/22 1950 01/01/22 2311 01/02/22 0516 01/03/22 1032 01/06/22 0141  WBC 8.0  --   --  4.7 4.4 3.2*  LATICACIDVEN  --  4.1* 2.0* 1.6  --   --     Microbiology Recent Results (from the past 240 hour(s))  Culture, blood (routine x 2)     Status: None   Collection Time: 01/01/22  7:50 PM   Specimen: BLOOD RIGHT WRIST  Result Value Ref Range Status   Specimen Description BLOOD RIGHT WRIST  Final   Special Requests   Final    BOTTLES DRAWN AEROBIC AND ANAEROBIC Blood Culture results may not be optimal due to an inadequate volume of blood received in culture bottles   Culture   Final    NO GROWTH 5 DAYS Performed at [facility], [place] [address], [place], [place] [address]    Report Status 01/06/2022 FINAL  Final  Culture, blood (routine x 2)     Status: None   Collection Time: 01/01/22  8:05 PM   Specimen: BLOOD LEFT HAND  Result Value Ref Range Status   Specimen Description BLOOD LEFT HAND  Final   Special Requests   Final    BOTTLES DRAWN AEROBIC AND ANAEROBIC Blood Culture results may not be optimal due to an inadequate volume of blood received in culture bottles   Culture   Final    NO GROWTH 5 DAYS Performed at [facility], [place] [address], [place],  [place] [address]    Report Status 01/06/2022 FINAL  Final  Urine Culture     Status: Abnormal   Collection Time: 01/02/22  1:20 AM   Specimen: In/Out Cath Urine  Result Value Ref Range Status   Specimen Description IN/OUT CATH URINE  Final   Special Requests   Final    NONE Performed at [facility], [place] [address] [place], [place] [address]    Culture MULTIPLE SPECIES PRESENT, SUGGEST RECOLLECTION (A)  Final   Report Status 01/03/2022 FINAL  Final     Medications:     stroke: early stages of recovery book   Does not apply Once   allopurinol  100 mg Oral Daily   aspirin EC  81 mg Oral Daily   atorvastatin  20 mg Oral Daily   cholestyramine  1 packet Oral BID   enoxaparin (LOVENOX) injection  30 mg Subcutaneous Q24H   levETIRAcetam  500 mg Oral BID   lipase/protease/amylase  24,000 Units Oral TID AC   LORazepam  1 mg Intravenous Once   metoprolol tartrate  25 mg Oral Daily   pantoprazole  40 mg Oral Daily   predniSONE  10 mg Oral Q breakfast   sodium chloride flush  10-40 mL Intracatheter Q12H   sucralfate  1 g Oral TID   Continuous Infusions:  sodium chloride Stopped (01/02/22 0412)      LOS: 5 days   Charlynne Cousins  [facility]  01/06/2022, 8:26 AM

## 2022-01-06 NOTE — Progress Notes (Signed)
MB performed Impedance check

## 2022-01-06 NOTE — Procedures (Signed)
Patient Name: Wanda Valdez  MRN: [id number]  Epilepsy Attending: Lora Havens  Referring Physician/Provider: Lora Havens, MD  Duration: 01/05/2022 2220 to 01/06/2022 2220   Patient history: 76 year old female with jerking episodes.  EEG to evaluate for seizure.    Level of alertness: Awake, asleep   AEDs during EEG study: LEV, VPA   Technical aspects: This EEG study was done with scalp electrodes positioned according to the 10-20 International system of electrode placement. Electrical activity was reviewed with band pass filter of 1-70Hz , sensitivity of 7 uV/mm, display speed of 18mm/sec with a 60Hz  notched filter applied as appropriate. EEG data were recorded continuously and digitally stored.  Video monitoring was available and reviewed as appropriate.   Description: No posterior dominant rhythm was seen. Sleep was characterized by sleep spindles (12 to 14 Hz), maximal frontocentral region.  EEG showed 6-9Hz  alpha activity in left hemisphere and continuous 3-5 hz theta-delta slowing in right hemisphere. Lateralized periodic discharges with overriding fast activity at 1hz  were noted in right hemisphere, maximal right occipital region which at times appear rhythmic lasting 2-9 seconds consistent with brief-ictal-interictal rhythmic discharges.  Seizures without clinical signs were noted arising from right occipital region during which EEG showed 2.5 to 3 Hz polyspikes in right occipital region admixed with 4 to 5 Hz theta slowing which gradually evolved into 2 to 3 Hz delta slowing.  Average 3 seizures were seen per hour, lasting about 1.5 minutes.   ABNORMALITY -Seizure without clinical signs, right occipital region - Brief-ictal-interictal rhythmic discharges, right hemisphere, maximal right occipital region  - Lateralized periodic discharges with overriding fast activity, ( LPD +F), right hemisphere, maximal right occipital region  -Continuous slow, generalized and lateralized  right hemisphere   IMPRESSION: This study showed seizures without clinical signs arising from right occipital region, average 3 seizures per hour lasting about 1.5 minutes. There was also evidence of epileptogenicity arising from right hemisphere, maximal right occipital region with increased risk of seizure recurrence.    Additionally there is cortical dysfunction in right hemisphere likely secondary to underlying structural abnormality. Lastly there is moderate diffuse encephalopathy, non specific etiology.   Bazil Dhanani Barbra Sarks

## 2022-01-06 NOTE — Progress Notes (Signed)
MB performed impedance check

## 2022-01-07 ENCOUNTER — Inpatient Hospital Stay (HOSPITAL_COMMUNITY): Payer: Medicare Other

## 2022-01-07 DIAGNOSIS — R569 Unspecified convulsions: Secondary | ICD-10-CM | POA: Diagnosis not present

## 2022-01-07 HISTORY — PX: IR ANGIO INTRA EXTRACRAN SEL COM CAROTID INNOMINATE BILAT MOD SED: IMG5360

## 2022-01-07 LAB — GLUCOSE, CAPILLARY
Glucose-Capillary: 79 mg/dL (ref 70–99)
Glucose-Capillary: 89 mg/dL (ref 70–99)
Glucose-Capillary: 90 mg/dL (ref 70–99)

## 2022-01-07 LAB — CBC
HCT: 32.8 % — ABNORMAL LOW (ref 36.0–46.0)
Hemoglobin: 10.3 g/dL — ABNORMAL LOW (ref 12.0–15.0)
MCH: 32.7 pg (ref 26.0–34.0)
MCHC: 31.4 g/dL (ref 30.0–36.0)
MCV: 104.1 fL — ABNORMAL HIGH (ref 80.0–100.0)
Platelets: 125 10*3/uL — ABNORMAL LOW (ref 150–400)
RBC: 3.15 MIL/uL — ABNORMAL LOW (ref 3.87–5.11)
RDW: 13.8 % (ref 11.5–15.5)
WBC: 3.6 10*3/uL — ABNORMAL LOW (ref 4.0–10.5)
nRBC: 0 % (ref 0.0–0.2)

## 2022-01-07 LAB — VALPROIC ACID LEVEL: Valproic Acid Lvl: 67 ug/mL (ref 50.0–100.0)

## 2022-01-07 LAB — VITAMIN B1: Vitamin B1 (Thiamine): 80.6 nmol/L (ref 66.5–200.0)

## 2022-01-07 MED ORDER — LIDOCAINE HCL (PF) 1 % IJ SOLN
INTRAMUSCULAR | Status: AC
  Administered 2022-01-07: 15 mL
  Filled 2022-01-07: qty 30

## 2022-01-07 MED ORDER — DIVALPROEX SODIUM 125 MG PO CSDR
500.0000 mg | DELAYED_RELEASE_CAPSULE | Freq: Three times a day (TID) | ORAL | Status: DC
  Administered 2022-01-07 – 2022-01-09 (×7): 500 mg via ORAL
  Filled 2022-01-07 (×7): qty 4

## 2022-01-07 MED ORDER — SODIUM CHLORIDE 0.9 % IV SOLN: INTRAVENOUS | Status: DC

## 2022-01-07 MED ORDER — IOHEXOL 300 MG/ML  SOLN
150.0000 mL | Freq: Once | INTRAMUSCULAR | Status: AC | PRN
  Administered 2022-01-07: 120 mL via INTRA_ARTERIAL

## 2022-01-07 MED ORDER — HEPARIN SODIUM (PORCINE) 1000 UNIT/ML IJ SOLN
INTRAMUSCULAR | Status: AC
  Filled 2022-01-07: qty 10

## 2022-01-07 MED ORDER — MIDAZOLAM HCL 2 MG/2ML IJ SOLN
INTRAMUSCULAR | Status: AC | PRN
  Administered 2022-01-07: 1 mg via INTRAVENOUS

## 2022-01-07 MED ORDER — FENTANYL CITRATE (PF) 100 MCG/2ML IJ SOLN
INTRAMUSCULAR | Status: AC | PRN
  Administered 2022-01-07: 25 ug via INTRAVENOUS

## 2022-01-07 MED ORDER — IOHEXOL 300 MG/ML  SOLN
50.0000 mL | Freq: Once | INTRAMUSCULAR | Status: AC | PRN
  Administered 2022-01-07: 10 mL via INTRA_ARTERIAL

## 2022-01-07 MED ORDER — FENTANYL CITRATE (PF) 100 MCG/2ML IJ SOLN
INTRAMUSCULAR | Status: AC
  Filled 2022-01-07: qty 2

## 2022-01-07 MED ORDER — MIDAZOLAM HCL 2 MG/2ML IJ SOLN
INTRAMUSCULAR | Status: AC
  Filled 2022-01-07: qty 2

## 2022-01-07 MED ORDER — NALOXONE HCL 0.4 MG/ML IJ SOLN
INTRAMUSCULAR | Status: AC
  Filled 2022-01-07: qty 1

## 2022-01-07 MED ORDER — HEPARIN SODIUM (PORCINE) 1000 UNIT/ML IJ SOLN
INTRAMUSCULAR | Status: AC | PRN
  Administered 2022-01-07: 1000 [IU] via INTRAVENOUS

## 2022-01-07 MED ORDER — SODIUM CHLORIDE 0.9 % IV SOLN
200.0000 mg | Freq: Once | INTRAVENOUS | Status: AC
  Administered 2022-01-07: 200 mg via INTRAVENOUS
  Filled 2022-01-07: qty 20

## 2022-01-07 MED ORDER — LACOSAMIDE 50 MG PO TABS
100.0000 mg | ORAL_TABLET | Freq: Two times a day (BID) | ORAL | Status: DC
  Administered 2022-01-07 – 2022-01-09 (×5): 100 mg via ORAL
  Filled 2022-01-07 (×5): qty 2

## 2022-01-07 MED ORDER — FLUMAZENIL 0.5 MG/5ML IV SOLN
INTRAVENOUS | Status: AC
  Filled 2022-01-07: qty 5

## 2022-01-07 NOTE — Procedures (Signed)
INR.   Status post bilateral common carotid arteriograms. Right CFA approach.   Findings. 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 Fatima Sanger MD

## 2022-01-07 NOTE — Progress Notes (Signed)
PT Cancellation Note  Patient Details Name: Wanda Valdez MRN: [id number] DOB: [birth year]   Cancelled Treatment:    Reason Eval/Treat Not Completed: Patient at procedure or test/unavailable. Acute PT to return as able to progress mobility.  Lewis Shock, PT, DPT Acute Rehabilitation Services Secure chat preferred Office #: [phone]    Iona Hansen 01/07/2022, 12:47 PM

## 2022-01-07 NOTE — Progress Notes (Signed)
LTM Maintenance complete, no skin breakdown at all skin sites and all under 10.

## 2022-01-07 NOTE — Evaluation (Signed)
Speech Language Pathology Evaluation Patient Details Name: Wanda Valdez MRN: [id number] DOB: January 04, 1946 Today's Date: 01/07/2022 Time: 1500-1520 SLP Time Calculation (min) (ACUTE ONLY): 20 min  Problem List:  Patient Active Problem List   Diagnosis Date Noted   Cerebrovascular accident (CVA) due to embolism of right middle cerebral artery (HCC) 01/05/2022   Hypotension due to hypovolemia 01/02/2022   Acute cystitis without hematuria 01/02/2022   High anion gap metabolic acidosis 01/02/2022   Seizures (HCC) 01/02/2022   Shock (HCC) 01/02/2022   Sepsis (HCC) 01/01/2022   Hypovolemic shock (HCC) 01/26/2021   Syncope 09/19/2020   Current chronic use of systemic steroids 09/19/2020   Protein-calorie malnutrition, severe 09/12/2020   Hypokalemia 09/10/2020   Dysphagia 09/10/2020   Diarrhea 09/10/2020   Dehydration 09/10/2020   Transaminitis 09/10/2020   AKI (acute kidney injury) (HCC) 09/10/2020   Lymphocytic colitis 09/10/2020   Rib pain on right side 09/10/2020   SOB (shortness of breath)    Past Medical History:  Past Medical History:  Diagnosis Date   Hypertension    Past Surgical History:  Past Surgical History:  Procedure Laterality Date   ABDOMINAL HYSTERECTOMY     BIOPSY  09/11/2020   Procedure: BIOPSY;  Surgeon: Kathi Der, MD;  Location: [facility] ENDOSCOPY;  Service: Gastroenterology;;   BIOPSY  09/13/2020   Procedure: BIOPSY;  Surgeon: Kathi Der, MD;  Location: [facility];  Service: Gastroenterology;;   BIOPSY  01/29/2021   Procedure: BIOPSY;  Surgeon: Vida Rigger, MD;  Location:  [facility] ENDOSCOPY;  Service: Endoscopy;;   COLONOSCOPY N/A 09/13/2020   Procedure: COLONOSCOPY;  Surgeon: Kathi Der, MD;  Location: [facility]OSCOPY;  Service: Gastroenterology;  Laterality: N/A;   ESOPHAGOGASTRODUODENOSCOPY N/A 09/11/2020   Procedure: ESOPHAGOGASTRODUODENOSCOPY (EGD);  Surgeon: Kathi Der, MD;  Location: [facility];  Service: Gastroenterology;   Laterality: N/A;   ESOPHAGOGASTRODUODENOSCOPY (EGD) WITH PROPOFOL N/A 01/29/2021   Procedure: ESOPHAGOGASTRODUODENOSCOPY (EGD) WITH PROPOFOL;  Surgeon: Vida Rigger, MD;  Location: [facility] ENDOSCOPY;  Service: Endoscopy;  Laterality: N/A;  With dilation as well and will need ultraslim endoscope in the room   POLYPECTOMY  09/13/2020   Procedure: POLYPECTOMY;  Surgeon: Kathi Der, MD;  Location: [facility]OS[facility];  Service: Gastroenterology;;   Gaspar Bidding DILATION N/A 01/29/2021   Procedure: Gaspar Bidding DILATION;  Surgeon: Vida Rigger, MD;  Location: [facility] ENDOSCOPY;  Service: Endoscopy;  Laterality: N/A;   HPI:  Patient is a 76 y.o. female with PMH: lymphocytic colitis, HTN, recent right shoulder surgery(one month ago), esophageal stricture s/p EGD with dilation. She was found to be weak by family, complaining of blurred vision and headache and was brought to ER. She was found to be hypotensive, briefly started on Levophed and given fluid bolus and started on empiric antibiotics for features concerning for sepsis. Lab work also showed thrombocytopenia acute renal failure and hyperglycemia. 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 SLE completed on 01/04/2022 and [facility] SLP services recommended but no acute level SLP recommended.[name] SLE reordered by MD on 01/06/22.   Assessment / Plan / Recommendation Clinical Impression  Patient appears significantly more confused during today's evaluation compared to previous one 3 days ago (at approximately the same time of day). When SLP arrived into room, patient was tangled up in her hospital gown and was trying to get out of bed. She was oriented to year, month, date, day of week but not oriented to time or situation, asking SLP, "Is this Wanda Valdez?" (street  she lives on) and saying, "I dont remember driving here". SLP assisted her with putting on a fresh gown but patient requiring frequent cues  to her fidgeting and moving around in bed. SLP then administered the SLUMS examination for which patient received a score of 9 out of a possible 24 (omitted two questions because of her visual impairment). Her score places her in category for 'Dementia' (scores 1-20; adjusted to 1-14). The SLUMS examination does not diagnose dementia but does indicate need for further testing. Per chart review, daughter had reported that patient's mentation flucutates throughout the day. SLP will follow patient briefly and seeing how she functions at different times of day may be beneficial. Recommendation remains for [facility] SLP services and 24/7 supervision.    SLP Assessment  SLP Recommendation/Assessment: Patient needs continued Speech Lanaguage Pathology Services SLP Visit Diagnosis: Cognitive communication deficit (R41.841)    Recommendations for follow up therapy are one component of a multi-disciplinary discharge planning process, led by the attending physician.  Recommendations may be updated based on patient status, additional functional criteria and insurance authorization.    Follow Up Recommendations  Home health SLP    Assistance Recommended at Discharge  Frequent or constant Supervision/Assistance  Functional Status Assessment Patient has had a recent decline in their functional status and demonstrates the ability to make significant improvements in function in a reasonable and predictable amount of time.  Frequency and Duration min 1 x/week  1 week      SLP Evaluation Cognition  Overall Cognitive Status: No family/caregiver present to determine baseline cognitive functioning Arousal/Alertness: Awake/alert Orientation Level: Oriented to person;Oriented to time;Disoriented to place;Disoriented to situation Year: 2023 Month: November Day of Week: Correct Attention: Sustained Sustained Attention: Impaired Sustained Attention Impairment: Verbal basic;Functional basic Memory: Impaired Memory  Impairment: Retrieval deficit;Storage deficit Awareness: Impaired Awareness Impairment: Emergent impairment;Intellectual impairment Executive Function: Organizing;Sequencing Sequencing: Impaired Sequencing Impairment: Verbal basic;Verbal complex;Functional basic Organizing: Impaired Organizing Impairment: Verbal basic;Verbal complex;Functional basic Safety/Judgment: Impaired Comments: Patient attempting to get out of bed and wondering where her car was       Comprehension  Auditory Comprehension Overall Auditory Comprehension: Impaired Conversation: Simple Interfering Components: Attention;Visual impairments;Processing speed;Working memory EffectiveTechniques: Extra processing time;Repetition;Slowed speech;Stressing words Visual Recognition/Discrimination Discrimination: Not tested Reading Comprehension Reading Status: Not tested    Expression Expression Primary Mode of Expression: Verbal Verbal Expression Overall Verbal Expression: Appears within functional limits for tasks assessed   Oral / Motor  Oral Motor/Sensory Function Overall Oral Motor/Sensory Function: Within functional limits Motor Speech Overall Motor Speech: Appears within functional limits for tasks assessed Respiration: Within functional limits Resonance: Within functional limits Articulation: Within functional limitis Intelligibility: Intelligible Motor Planning: Witnin functional limits           Angela Nevin, MA, CCC-SLP Speech Therapy

## 2022-01-07 NOTE — Sedation Documentation (Signed)
Right femoral sheath removed. Manual pressure held at right femoral artery

## 2022-01-07 NOTE — Progress Notes (Addendum)
[facility] PROGRESS NOTE    Progress Note  Wanda Valdez  FAO:[id number] DOB: 11/06/1945 DOA: 01/01/2022 PCP: Raquel James, MD     Brief Narrative:   Wanda Valdez is an 76 y.o. female past medical history of lymphocytic colitis, essential hypertension with recent right shoulder surgery about a month ago was found to be weak complaining of blurry vision and headache, accompanied episode of diarrhea unable to get out of bed the day prior to admission comes into the ED code stroke was called as he was also found hypotensive in the ED fluid resuscitated started on Levophed, empiric antibiotics, CT of the head was unremarkable CT perfusion study showed hypoperfusion to the right cerebral hemisphere with a stenosis of the right innominate artery.   Assessment/Plan:   Circulatory shock: Required pressors temporarily  for couple of hours.  Of unclear etiology. Question due to seizures. Blood pressure has remained stable. Culture data has remained negative till date discontinue antibiotics. Now on a 24-hour EEG hopefully she will complete today.  Acute CVA: MRI of the brain showed an acute cortical infarct of the right parietal region. HgbA1c 8, fasting lipid panel LDL 76. 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 PT, OT evaluated the patient recommended home health. Transthoracic Echo, EF of 55% grade 1 diastolic dysfunction. On statins. BP goal: permissive HTN upto 220/120 mmHg. No events telemetry monitoring. Neurology was consulted recommended aspirin and elective revascularization with angioplasty. 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 EEG showed evidence of seizures neurology was consulted she was started on AEDs. Clinically she is not exhibiting any manifestation of  seizure.  Acute confusional state: She has no diagnosis of dementia, spoken to her daughter her mentation fluctuates throughout the day. Continue Haldol and Seroquel as needed as needed. Try to minimize medication (once daily) at night. She is at high risk of aspiration and agitation.  Acute kidney injury: With a baseline creatinine of less than 1 on admission 1.3 . Basic metabolic panel is pending.  Thrombocytopenia: Normally she is greater than 150,  LDH 154. Resolved  Macrocytic anemia: CBC shows a hemoglobin at baseline, anemia panel showed ferritin 55 will need further evaluation as an out patient on colonoscopy, will start on ferrous sulfate upon discharge. Folate of 40  Diffuse abdominal tenderness: CT of the abdomen pelvis CT of the abdomen showed mild left hydronephrosis no stones, mild bilateral renal atrophy with chronic perinephric stranding,  transverse colitis, small pleural effusions and trace ascites.  No acute abnormalities to explain her diffuse tenderness. Resume Creon. FOBT stools negative.  Hyperglycemia: Likely reactive A1c of 4.8.  Discontinue all insulins.  Essential hypertension: Blood pressure is running up   DVT prophylaxis: Lovenox Family Communication:none Status is: Inpatient Remains inpatient appropriate because: Shock    Code Status:     Code Status Orders  (From admission, onward)           Start     Ordered   01/02/22 0310  Full code  Continuous        01/02/22 0310           Code Status History     Date Active Date Inactive Code Status Order ID Comments User Context   01/26/2021 1846 01/30/2021 2111 Full Code [id number]  Lupita Leash, MD Inpatient   09/19/2020 2142 09/22/2020  1733 Full Code [id number]  Hillary BowGardner, [name], DO ED   09/10/2020 1849 09/13/2020 2020 Full Code [id number]  [name], [name], MD Inpatient         IV Access:   Peripheral IV   Procedures and diagnostic studies:   Overnight EEG with  video  Result Date: 01/06/2022 Charlsie QuestYadav, Priyanka O, MD     01/06/2022 10:29 AM Patient Name: Wanda Valdez MRN: [id number] Epilepsy Attending: Charlsie QuestPriyanka O Yadav Referring Physician/Provider: Charlsie QuestYadav, Priyanka O, MD Duration: 01/05/2022 2220 to 01/06/2022 0730  Patient history: 76 year old female with jerking episodes.  EEG to evaluate for seizure.  Level of alertness: Awake, asleep  AEDs during EEG study: LEV  Technical aspects: This EEG study was done with scalp electrodes positioned according to the 10-20 International system of electrode placement. Electrical activity was reviewed with band pass filter of 1-70Hz , sensitivity of 7 uV/mm, display speed of 7030mm/sec with a 60Hz  notched filter applied as appropriate. EEG data were recorded continuously and digitally stored.  Video monitoring was available and reviewed as appropriate.  Description: No posterior dominant rhythm was seen. Sleep was characterized by sleep spindles (12 to 14 Hz), maximal frontocentral region.  EEG showed 6-9Hz  alpha activity in left hemisphere and continuous 3-5 hz theta-delta slowing in right hemisphere. Lateralized periodic discharges with overriding fast activity at 1hz  were noted in right hemisphere, maximal right occipital region which at times appear rhythmic lasting 2-9 seconds consistent with brief-ictal-interictal rhythmic discharges.  Seizures without clinical signs were noted arising from right occipital region during which EEG showed 2.5 to 3 Hz polyspikes in right occipital region admixed with 4 to 5 Hz theta slowing which gradually evolved into 2 to 3 Hz delta slowing.  Average 3 seizures were seen per hour, lasting about 1.5 minutes.  ABNORMALITY -Seizure without clinical signs, right occipital region - Brief-ictal-interictal rhythmic discharges, right hemisphere, maximal right occipital region - Lateralized periodic discharges with overriding fast activity, ( LPD +F), right hemisphere, maximal right occipital region  -Continuous slow, generalized and lateralized right hemisphere  IMPRESSION: This study showed seizures without clinical signs arising from right occipital region, average 3 seizures per hour lasting about 1.5 minutes. There was also evidence of epileptogenicity arising from right hemisphere, maximal right occipital region with increased risk of seizure recurrence.  Additionally there is cortical dysfunction in right hemisphere likely secondary to underlying structural abnormality. Lastly there is moderate diffuse encephalopathy, non specific etiology.  Charlsie QuestPriyanka O Yadav   EEG adult  Result Date: 01/05/2022 Charlsie QuestYadav, Priyanka O, MD     01/05/2022  5:55 PM Patient Name: Wanda Valdez MRN: [id number] Epilepsy Attending: Charlsie QuestPriyanka O Yadav Referring Physician/Provider: Kara Meadoberman, Stevi W, NP Date: 01/05/2022 Duration: 22.41 mins Patient history: 76 year old female with jerking episodes.  EEG to evaluate for seizure. Level of alertness: Awake AEDs during EEG study: LEV Technical aspects: This EEG study was done with scalp electrodes positioned according to the 10-20 International system of electrode placement. Electrical activity was reviewed with band pass filter of 1-70Hz , sensitivity of 7 uV/mm, display speed of 2130mm/sec with a 60Hz  notched filter applied as appropriate. EEG data were recorded continuously and digitally stored.  Video monitoring was available and reviewed as appropriate. Description: No posterior dominant rhythm was seen. EEG showed 6-9Hz  alpha activity in left hemisphere and continuous 3-5 hz theta-delta slowing in right hemisphere . Lateralized periodic discharges with overriding fast activity at 1hz  were noted in right hemisphere, maximal right occipital region which at times appear rhythmic lasting 2-9 seconds consistent with  brief-ictal-interictal rhythmic discharges   ABNORMALITY - Brief-ictal-interictal rhythmic discharges, right hemisphere, maximal right occipital region - Lateralized periodic  discharges with overriding fast activity, ( LPD +F), right hemisphere, maximal right occipital region -Continuous slow, generalized and lateralized right hemisphere  IMPRESSION: This study showed evidence of epileptogenicity arising from right hemisphere, maximal right occipital region which is on the ictal-interictal continuum with increased risk of seizure recurrence.  Additionally there is cortical dysfunction in right hemisphere likely secondary to underlying structural abnormality. Lastly there is moderate diffuse encephalopathy, non specific etiology.  Recommend long term eeg for further evaluation  Priyanka Annabelle Harman     Medical Consultants:   None.   Subjective:    Wanda Valdez no complains, no events  Objective:    Vitals:   01/06/22 2045 01/06/22 2315 01/07/22 0420 01/07/22 0840  BP: (!) 175/87 (!) 149/83 (!) 172/96 (!) 166/99  Pulse: 68 60 67 67  Resp: 20   16  Temp: 98.3 F (36.8 C) 98 F (36.7 C) 97.8 F (36.6 C) 98.2 F (36.8 C)  TempSrc: Oral Oral Oral Oral  SpO2: 97% 94% 98% 96%  Weight:      Height:       SpO2: 96 % O2 Flow Rate (L/min): 3 L/min   Intake/Output Summary (Last 24 hours) at 01/07/2022 0857 Last data filed at 01/06/2022 1000 Gross per 24 hour  Intake 50 ml  Output 1 ml  Net 49 ml    Filed Weights   01/01/22 1903  Weight: 41.6 kg    Exam: General exam: In no acute distress. Respiratory system: Good air movement and clear to auscultation. Cardiovascular system: S1 & S2 heard, RRR. No JVD. Gastrointestinal system: Abdomen is nondistended, soft and nontender.  Extremities: No pedal edema. Skin: No rashes, lesions or ulcers Psychiatry: Judgement and insight appear normal. Mood & affect appropriate. Data Reviewed:    Labs: Basic Metabolic Panel: Recent Labs  Lab 01/01/22 1900 01/01/22 1905 01/02/22 0516 01/03/22 1032 01/05/22 1138 01/06/22 0345  NA 142 139  --  142 139 141  K 5.0 4.8  --  3.2* 3.8 3.7  CL 107 110  --   100 110 114*  CO2 13*  --   --  27 19* 21*  GLUCOSE 215* 211*  --  107* 97 89  BUN 13 10  --  14 8 9   CREATININE 1.60* 1.30* 1.16* 1.20* 1.04* 1.08*  CALCIUM 9.9  --   --  8.7* 8.9 8.7*    GFR Estimated Creatinine Clearance: 27.7 mL/min (A) (by C-G formula based on SCr of 1.08 mg/dL (H)). Liver Function Tests: Recent Labs  Lab 01/01/22 1900  AST 31  ALT 15  ALKPHOS 87  BILITOT 0.7  PROT 6.3*  ALBUMIN 3.3*    No results for input(s): "LIPASE", "AMYLASE" in the last 168 hours. Recent Labs  Lab 01/01/22 1950  AMMONIA 33    Coagulation profile Recent Labs  Lab 01/01/22 1900 01/06/22 0141  INR 1.0 1.0    COVID-19 Labs  No results for input(s): "DDIMER", "FERRITIN", "LDH", "CRP" in the last 72 hours.   Lab Results  Component Value Date   SARSCOV2NAA NEGATIVE 01/26/2021   SARSCOV2NAA NEGATIVE 09/19/2020   SARSCOV2NAA NEGATIVE 09/10/2020    CBC: Recent Labs  Lab 01/01/22 1900 01/01/22 1905 01/02/22 0516 01/03/22 1032 01/06/22 0141  WBC 8.0  --  4.7 4.4 3.2*  NEUTROABS 3.0  --   --  2.7  --   HGB 13.2 13.9  11.2* 11.6* 11.3*  HCT 40.6 41.0 33.9* 35.1* 35.1*  MCV 104.6*  --  102.4* 101.2* 103.5*  PLT 262  --  83* 182 141*    Cardiac Enzymes: No results for input(s): "CKTOTAL", "CKMB", "CKMBINDEX", "TROPONINI" in the last 168 hours. BNP (last 3 results) No results for input(s): "PROBNP" in the last 8760 hours. CBG: Recent Labs  Lab 01/05/22 2239 01/06/22 0724 01/06/22 1144 01/06/22 2203 01/07/22 0631  GLUCAP 106* 90 93 78 79    D-Dimer: No results for input(s): "DDIMER" in the last 72 hours. Hgb A1c: No results for input(s): "HGBA1C" in the last 72 hours.  Lipid Profile: No results for input(s): "CHOL", "HDL", "LDLCALC", "TRIG", "CHOLHDL", "LDLDIRECT" in the last 72 hours.  Thyroid function studies: No results for input(s): "TSH", "T4TOTAL", "T3FREE", "THYROIDAB" in the last 72 hours.  Invalid input(s): "FREET3"  Anemia work up: No  results for input(s): "VITAMINB12", "FOLATE", "FERRITIN", "TIBC", "IRON", "RETICCTPCT" in the last 72 hours.  Sepsis Labs: Recent Labs  Lab 01/01/22 1900 01/01/22 1950 01/01/22 2311 01/02/22 0516 01/03/22 1032 01/06/22 0141  WBC 8.0  --   --  4.7 4.4 3.2*  LATICACIDVEN  --  4.1* 2.0* 1.6  --   --     Microbiology Recent Results (from the past 240 hour(s))  Culture, blood (routine x 2)     Status: None   Collection Time: 01/01/22  7:50 PM   Specimen: BLOOD RIGHT WRIST  Result Value Ref Range Status   Specimen Description BLOOD RIGHT WRIST  Final   Special Requests   Final    BOTTLES DRAWN AEROBIC AND ANAEROBIC Blood Culture results may not be optimal due to an inadequate volume of blood received in culture bottles   Culture   Final    NO GROWTH 5 DAYS Performed at [facility], [address], [place], [place] [address]    Report Status 01/06/2022 FINAL  Final  Culture, blood (routine x 2)     Status: None   Collection Time: 01/01/22  8:05 PM   Specimen: BLOOD LEFT HAND  Result Value Ref Range Status   Specimen Description BLOOD LEFT HAND  Final   Special Requests   Final    BOTTLES DRAWN AEROBIC AND ANAEROBIC Blood Culture results may not be optimal due to an inadequate volume of blood received in culture bottles   Culture   Final    NO GROWTH 5 DAYS Performed at [facility], [address] [place], [place] [address]    Report Status 01/06/2022 FINAL  Final  Urine Culture     Status: Abnormal   Collection Time: 01/02/22  1:20 AM   Specimen: In/Out Cath Urine  Result Value Ref Range Status   Specimen Description IN/OUT CATH URINE  Final   Special Requests   Final    NONE Performed at [facility], [address] [place], [place] [address]    Culture MULTIPLE SPECIES PRESENT, SUGGEST RECOLLECTION (A)  Final   Report Status 01/03/2022 FINAL  Final     Medications:    allopurinol  100 mg Oral Daily   aspirin EC  81 mg Oral Daily    atorvastatin  20 mg Oral Daily   cholestyramine  1 packet Oral BID   divalproex  250 mg Oral Q8H   enoxaparin (LOVENOX) injection  30 mg Subcutaneous Q24H   levETIRAcetam  500 mg Oral BID   lipase/protease/amylase  24,000 Units Oral TID AC  LORazepam  1 mg Intravenous Once   melatonin  3 mg Oral QHS   metoprolol tartrate  25 mg Oral Daily   pantoprazole  40 mg Oral Daily   predniSONE  10 mg Oral Q breakfast   sodium chloride flush  10-40 mL Intracatheter Q12H   sucralfate  1 g Oral TID   Continuous Infusions:  sodium chloride Stopped (01/02/22 0412)      LOS: 6 days   Marinda Elk  [facility]  01/07/2022, 8:57 AM

## 2022-01-07 NOTE — Progress Notes (Signed)
LTM maint complete - no skin breakdown. Patient removed the majority of leads.  All leads reapplied. [facility] monitored, Event button test confirmed by [facility].

## 2022-01-07 NOTE — Progress Notes (Signed)
LTM EEG discontinued - no skin breakdown at unhook.   

## 2022-01-07 NOTE — Progress Notes (Signed)
STROKE TEAM PROGRESS NOTE   INTERVAL HISTORY Patient is sitting up comfortably in bed.  She has not had any clinical seizures and she denies seeing any positive visual phenomena , blurred vision or twitching's on the left side.  She has long-term EEG monitoring hooked up.  Which continues to show evidence of epileptogenic activity in the right occipital region    She was started on Keppra 2 days ago but yesterday Depakote has been added.  Plan to load with Vimpat today as well but will discontinue long-term EEG in order to get diagnostic cerebral catheter angiogram .  Vital signs are stable.  Neurological exam is unchanged. Vitals:   01/06/22 2315 01/07/22 0420 01/07/22 0840 01/07/22 1303  BP: (!) 149/83 (!) 172/96 (!) 166/99 (!) 154/88  Pulse: 60 67 67 (!) 55  Resp:   16 (!) 23  Temp: 98 F (36.7 C) 97.8 F (36.6 C) 98.2 F (36.8 C)   TempSrc: Oral Oral Oral   SpO2: 94% 98% 96% 100%  Weight:      Height:       CBC:  Recent Labs  Lab 01/01/22 1900 01/01/22 1905 01/03/22 1032 01/06/22 0141  WBC 8.0   < > 4.4 3.2*  NEUTROABS 3.0  --  2.7  --   HGB 13.2   < > 11.6* 11.3*  HCT 40.6   < > 35.1* 35.1*  MCV 104.6*   < > 101.2* 103.5*  PLT 262   < > 182 141*   < > = values in this interval not displayed.   Basic Metabolic Panel:  Recent Labs  Lab 01/05/22 1138 01/06/22 0345  NA 139 141  K 3.8 3.7  CL 110 114*  CO2 19* 21*  GLUCOSE 97 89  BUN 8 9  CREATININE 1.04* 1.08*  CALCIUM 8.9 8.7*   Lipid Panel:  Recent Labs  Lab 01/02/22 0516  CHOL 167  TRIG 45  HDL 82  CHOLHDL 2.0  VLDL 9  LDLCALC 76   HgbA1c:  Recent Labs  Lab 01/02/22 0516  HGBA1C 4.8   Urine Drug Screen:  Recent Labs  Lab 01/02/22 0120  LABOPIA NONE DETECTED  COCAINSCRNUR NONE DETECTED  LABBENZ NONE DETECTED  AMPHETMU NONE DETECTED  THCU NONE DETECTED  LABBARB NONE DETECTED    Alcohol Level  Recent Labs  Lab 01/01/22 1900  ETH <10   IMAGING past 24 hours No results  found.  PHYSICAL EXAM  Physical Exam  Constitutional: Appears well-developed and well-nourished.  Pleasant elderly African-American lady not in distress. Psych: Affect appropriate to situation, calm and cooperative with exam Eyes: No scleral injection HENT: No OP obstrucion MSK: no joint deformities.  Cardiovascular: Normal rate and regular rhythm.  Respiratory: Effort normal, non-labored breathing on room air GI: Soft.  No distension. There is no tenderness.  Skin: WDI  Neuro: Mental Status: Patient is awake, alert, oriented to person, place, month, year, and situation. Patient is able to give a clear and coherent history. No signs of aphasia or neglect. Speech is fluent, attention and concentration are intact.  Cranial Nerves: II: Visual Fields are full with complaints of blurred vision in left eye left peripheral field (chronic for 2 months) Pupils are equal, round, and reactive to light.   III,IV, VI: EOMI without ptosis or diploplia.  V: Facial sensation is symmetric to light touch VII: Facial movement is symmetric resting and smiling VIII: Hearing is intact to voice X: Palate elevates symmetrically XI: Shoulder shrug is symmetric. XII:  Tongue protrudes midline without atrophy or fasciculations.  Motor: Tone is normal. Bulk is normal. 5/5 strength was present in bilateral lower extremities.  Bilateral upper extremities elevate antigravity with some pain limited strength assessment due to right > left shoulder pain. (Total right shoulder arthoplasty in August 2023).  Prominent asterixis noted in both upper extremities Sensory: Sensation is symmetric to light touch and temperature in the arms and legs. No extinction to DSS present.  Cerebellar: No overt dysmetria noted  ASSESSMENT/PLAN [name] is a 76 y.o. female with history of HTN, tobacco use, chronic diarrhea, poor PO intake, history of hospitalization in December 2022 for hypovolemic shock related to poor  intake, and AKI presenting with dizziness, headache, and blurred vision. EMS noted generalized jerking body movements on arrival, though she was able to walk during these movements. On arrival patient was significantly hypotensive with SBP of 60-70s. TNK was not administered due to patient presenting outside of the thrombolytic therapy time window. CTP showed hypoperfusion in the right cerebral hemisphere correlating with significant stenosis of the right innominate. Patient was felt to have cerebral hypoperfusion versus toxic-metabolic encephalopathy with subsequent asterixis versus myoclonic jerking movements in the setting of deranged renal function. 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    Stroke:  right parietal infarct. 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 ASPECTS of 10. CTA head & neck motion degraded. Negative CTA without acute LVO. 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 Diffuse tortuosity of the major arterial vasculature of the head and neck, suggesting chronic underlying hypertension.  CT perfusion No evidence for acute core infarction. 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  2D Echo LVEF 60-65%, left atrial size is moderately dilated. No atrial level shunt detected.  EEG 11/2 with epileptogenicity arising from the right occipital region,  cortical dysfunction in the right hemisphere likely secondary to underlying structural abnormality, postictal state. No seizures noted. May need loop recorder at discharge  LDL 76 HgbA1c 4.8 VTE prophylaxis - Lovenox SQ    Diet   Diet NPO time specified Except for: Sips with Meds   No antithrombotic prior to admission, now on aspirin 81 mg and Plavix 75 mg daily x 3 weeks and then aspirin alone.  Therapy recommendations:  Home Health OT, Home health SLP, Home health PT Disposition:  pending   Concern for possible seizure EEG as above with possible epileptogenicity from right occipital region and cortical dysfunction in the right hemisphere likely secondary to underlying structural abnormality, postictal state. No seizures noted.  No history of seizures  Continue Keppra 500 mg PO BID Seizure precautions   Hypertension Home meds:  metoprolol  Stable Permissive hypertension (OK if < 220/120) but gradually normalize in 5-7 days Long-term BP goal normotensive  Hypotension with circulatory shock, resolved History of hypovolemic shock related to poor PO intake  Encourage adequate PO intake  Required pressors briefly on hospital arrival  Hyperlipidemia Home meds:  atorvastatin 20 mg PO, resumed in hospital LDL 76, goal < 70 High intensity statin not indicated as LDL is near goal on current dose  Continue statin at discharge 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  Recommend   aspirin alone due to thrombocytopenia with platelet count 83,000.  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  Have consulted interventional neuroradiology for the same.    Aggressive risk factor modification.   Added Depakote to Keppra for electrographic seizures yesterday but this still continued hence we will add Vimpat.  Discontinue long-term EEG monitoring in order to get diagnostic cerebral catheter angiogram.  No family available at the bedside for discussion.  Greater than 50% time during this 35-minute visit was spent in counseling and coordination of care about her stroke as well as discussion about brachiocephalic artery stenosis and discussion about revascularization and answering questions Discussed with Dr. Darin Engels and Caprice Red Delia Heady, MD Medical Director [facility] Pager: [phone] 01/07/2022 1:16 PM  To contact Stroke Continuity provider, please refer to [email or web address]. After hours, contact General Neurology

## 2022-01-07 NOTE — Procedures (Addendum)
Patient Name: Wanda Valdez  MRN: [id number]  Epilepsy Attending: Charlsie Quest  Referring Physician/Provider: Charlsie Quest, MD  Duration: 01/06/2022 2220 to 01/07/2022 1144   Patient history: 76 year old female with jerking episodes.  EEG to evaluate for seizure.    Level of alertness: Awake, asleep   AEDs during EEG study: LEV, VPA   Technical aspects: This EEG study was done with scalp electrodes positioned according to the 10-20 International system of electrode placement. Electrical activity was reviewed with band pass filter of 1-70Hz , sensitivity of 7 uV/mm, display speed of 55mm/sec with a 60Hz  notched filter applied as appropriate. EEG data were recorded continuously and digitally stored.  Video monitoring was available and reviewed as appropriate.   Description: No posterior dominant rhythm was seen. Sleep was characterized by sleep spindles (12 to 14 Hz), maximal frontocentral region.  EEG showed 6-9Hz  alpha activity in left hemisphere and continuous 3-5 hz theta-delta slowing in right hemisphere. Lateralized periodic discharges with overriding fast activity at 1hz  were noted in right hemisphere, maximal right occipital region which at times appear rhythmic lasting 2-9 seconds consistent with brief-ictal-interictal rhythmic discharges.  Seizures without clinical signs were noted arising from right occipital region during which EEG showed 2.5 to 3 Hz polyspikes in right occipital region admixed with 4 to 5 Hz theta slowing which gradually evolved into 2 to 3 Hz delta slowing.  Average  seizures were seen per hour, lasting about 1.5 minutes.   ABNORMALITY -Seizure without clinical signs, right occipital region - Brief-ictal-interictal rhythmic discharges, right hemisphere, maximal right occipital region  - Lateralized periodic discharges with overriding fast activity, ( LPD +F), right hemisphere, maximal right occipital region  -Continuous slow, generalized and lateralized  right hemisphere   IMPRESSION: This study showed seizures without clinical signs arising from right occipital region, average 3 seizures per hour lasting about 1.5 minutes. There was also evidence of epileptogenicity arising from right hemisphere, maximal right occipital region with increased risk of seizure recurrence.     Additionally there is cortical dysfunction in right hemisphere likely secondary to underlying structural abnormality. Lastly there is moderate diffuse encephalopathy, non specific etiology.   Ernestina Joe 

## 2022-01-08 DIAGNOSIS — A419 Sepsis, unspecified organism: Secondary | ICD-10-CM | POA: Diagnosis not present

## 2022-01-08 DIAGNOSIS — E861 Hypovolemia: Secondary | ICD-10-CM | POA: Diagnosis not present

## 2022-01-08 DIAGNOSIS — I9589 Other hypotension: Secondary | ICD-10-CM | POA: Diagnosis not present

## 2022-01-08 LAB — BASIC METABOLIC PANEL
Anion gap: 7 (ref 5–15)
BUN: 15 mg/dL (ref 8–23)
CO2: 18 mmol/L — ABNORMAL LOW (ref 22–32)
Calcium: 8.2 mg/dL — ABNORMAL LOW (ref 8.9–10.3)
Chloride: 117 mmol/L — ABNORMAL HIGH (ref 98–111)
Creatinine, Ser: 1.08 mg/dL — ABNORMAL HIGH (ref 0.44–1.00)
GFR, Estimated: 53 mL/min — ABNORMAL LOW (ref >60)
Glucose, Bld: 93 mg/dL (ref 70–99)
Potassium: 3.9 mmol/L (ref 3.5–5.1)
Sodium: 142 mmol/L (ref 135–145)

## 2022-01-08 LAB — GLUCOSE, CAPILLARY
Glucose-Capillary: 78 mg/dL (ref 70–99)
Glucose-Capillary: 96 mg/dL (ref 70–99)
Glucose-Capillary: 108 mg/dL — ABNORMAL HIGH (ref 70–99)
Glucose-Capillary: 96 mg/dL (ref 70–99)
Glucose-Capillary: 96 mg/dL (ref 70–99)

## 2022-01-08 LAB — CBC
HCT: 28.1 % — ABNORMAL LOW (ref 36.0–46.0)
Hemoglobin: 8.9 g/dL — ABNORMAL LOW (ref 12.0–15.0)
MCH: 32.6 pg (ref 26.0–34.0)
MCHC: 31.7 g/dL (ref 30.0–36.0)
MCV: 102.9 fL — ABNORMAL HIGH (ref 80.0–100.0)
Platelets: 128 10*3/uL — ABNORMAL LOW (ref 150–400)
RBC: 2.73 MIL/uL — ABNORMAL LOW (ref 3.87–5.11)
RDW: 13.8 % (ref 11.5–15.5)
WBC: 5.1 10*3/uL (ref 4.0–10.5)
nRBC: 0 % (ref 0.0–0.2)

## 2022-01-08 MED ORDER — CLOPIDOGREL BISULFATE 75 MG PO TABS
75.0000 mg | ORAL_TABLET | Freq: Every day | ORAL | Status: DC
  Administered 2022-01-08 – 2022-01-09 (×2): 75 mg via ORAL
  Filled 2022-01-08 (×2): qty 1

## 2022-01-08 NOTE — Progress Notes (Signed)
[facility] PROGRESS NOTE    Progress Note  Wanda Valdez  JJK:[id number] DOB: 06/23/1945 DOA: 01/01/2022 PCP: Raquel James, MD     Brief Narrative:   Wanda Valdez is an 76 y.o. female past medical history of lymphocytic colitis, essential hypertension with recent right shoulder surgery about a month ago was found to be weak complaining of blurry vision and headache, accompanied episode of diarrhea unable to get out of bed the day prior to admission comes into the ED code stroke was called as he was also found hypotensive in the ED fluid resuscitated started on Levophed, empiric antibiotics, CT of the head was unremarkable CT perfusion study showed hypoperfusion to the right cerebral hemisphere with a stenosis of the right innominate artery.   Assessment/Plan:   Circulatory shock: Required pressors temporarily for couple of hours.  Of unclear etiology. Question due to seizures. Blood pressure has remained stable. Culture data has remained negative: discontinue antibiotics.  Acute CVA: MRI of the brain showed an acute cortical infarct of the right parietal region. HgbA1c 8, fasting lipid panel LDL 76. 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 PT, OT evaluated the patient recommended home health. Transthoracic Echo, EF of 55% grade 1 diastolic dysfunction. On statins. BP goal: permissive HTN upto 220/120 mmHg. No events telemetry monitoring. Neurology was consulted recommended aspirin and elective revascularization with angioplasty. 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  Possible seizures: EEG showed evidence of seizures neurology was  consulted she was started on AEDs. Clinically she is not exhibiting any manifestation of seizure. -Added Depakote to Keppra   Acute confusional state: -appears resolved  Acute kidney injury: -stable around 1  Thrombocytopenia: -minimal  Macrocytic anemia: CBC shows a hemoglobin at baseline, anemia panel showed ferritin 55 will need further evaluation as an out patient with colonoscopy -start on ferrous sulfate upon discharge. Folate: 40  abdominal tenderness: CT of the abdomen pelvis CT of the abdomen showed mild left hydronephrosis no stones, mild bilateral renal atrophy with chronic perinephric stranding,  transverse colitis, small pleural effusions and trace ascites.  No acute abnormalities to explain her diffuse tenderness. Resume Creon. FOBT stools negative.  Hyperglycemia: Likely reactive A1c of 4.8.  Discontinue all insulins.  Essential hypertension: -see above   DVT prophylaxis: Lovenox Family Communication:none Status is: Inpatient Remains inpatient appropriate: PT/OT pending    Code Status: full      IV Access:   Peripheral IV      Medical Consultants:   Neurology IR   Subjective:    No current complaints except hunger--- remained NPO since yesterday  Objective:    Vitals:   01/07/22 1613 01/07/22 2000 01/08/22 0340 01/08/22 0831  BP: 113/83 109/79 125/87 (!) 142/89  Pulse: 62 70 70 71  Resp: 16 16 17 18   Temp: 98.3 F (36.8 C) 98 F (36.7 C) 98 F (36.7 C) (!) 97.5 F (36.4 C)  TempSrc: Oral Oral Oral Oral  SpO2:  100% 93% 96%  Weight:      Height:       SpO2: 96 % O2 Flow Rate (L/min): 2 L/min   Intake/Output Summary (Last 24 hours) at 01/08/2022 0959 Last data filed at 01/08/2022 0700 Gross per 24 hour  Intake 701.41  ml  Output --  Net 701.41 ml   Filed Weights   01/01/22 1903  Weight: 41.6 kg    Exam:  General: Appearance:    Thin female in no acute distress     Lungs:     respirations unlabored  Heart:     Normal heart rate.  MS:   All extremities are intact.   Neurologic:   Awake, alert    Data Reviewed:    Labs: Basic Metabolic Panel: Recent Labs  Lab 01/01/22 1900 01/01/22 1905 01/02/22 0516 01/03/22 1032 01/05/22 1138 01/06/22 0345 01/08/22 0818  NA 142 139  --  142 139 141 142  K 5.0 4.8  --  3.2* 3.8 3.7 3.9  CL 107 110  --  100 110 114* 117*  CO2 13*  --   --  27 19* 21* 18*  GLUCOSE 215* 211*  --  107* 97 89 93  BUN 13 10  --  14 8 9 15   CREATININE 1.60* 1.30* 1.16* 1.20* 1.04* 1.08* 1.08*  CALCIUM 9.9  --   --  8.7* 8.9 8.7* 8.2*   GFR Estimated Creatinine Clearance: 27.7 mL/min (A) (by C-G formula based on SCr of 1.08 mg/dL (H)). Liver Function Tests: Recent Labs  Lab 01/01/22 1900  AST 31  ALT 15  ALKPHOS 87  BILITOT 0.7  PROT 6.3*  ALBUMIN 3.3*   No results for input(s): "LIPASE", "AMYLASE" in the last 168 hours. Recent Labs  Lab 01/01/22 1950  AMMONIA 33   Coagulation profile Recent Labs  Lab 01/01/22 1900 01/06/22 0141  INR 1.0 1.0   COVID-19 Labs  No results for input(s): "DDIMER", "FERRITIN", "LDH", "CRP" in the last 72 hours.   Lab Results  Component Value Date   SARSCOV2NAA NEGATIVE 01/26/2021   SARSCOV2NAA NEGATIVE 09/19/2020   SARSCOV2NAA NEGATIVE 09/10/2020    CBC: Recent Labs  Lab 01/01/22 1900 01/01/22 1905 01/02/22 0516 01/03/22 1032 01/06/22 0141 01/07/22 2141 01/08/22 0818  WBC 8.0  --  4.7 4.4 3.2* 3.6* 5.1  NEUTROABS 3.0  --   --  2.7  --   --   --   HGB 13.2   < > 11.2* 11.6* 11.3* 10.3* 8.9*  HCT 40.6   < > 33.9* 35.1* 35.1* 32.8* 28.1*  MCV 104.6*  --  102.4* 101.2* 103.5* 104.1* 102.9*  PLT 262  --  83* 182 141* 125* 128*   < > = values in this interval not displayed.   Cardiac Enzymes: No results for input(s): "CKTOTAL", "CKMB", "CKMBINDEX", "TROPONINI" in the last 168 hours. BNP (last 3 results) No results for input(s): "PROBNP" in the last 8760 hours. CBG: Recent Labs  Lab 01/06/22 2203  01/07/22 0631 01/07/22 1611 01/07/22 2140 01/08/22 0603  GLUCAP 78 79 89 90 78   D-Dimer: No results for input(s): "DDIMER" in the last 72 hours. Hgb A1c: No results for input(s): "HGBA1C" in the last 72 hours.  Lipid Profile: No results for input(s): "CHOL", "HDL", "LDLCALC", "TRIG", "CHOLHDL", "LDLDIRECT" in the last 72 hours.  Thyroid function studies: No results for input(s): "TSH", "T4TOTAL", "T3FREE", "THYROIDAB" in the last 72 hours.  Invalid input(s): "FREET3"  Anemia work up: No results for input(s): "VITAMINB12", "FOLATE", "FERRITIN", "TIBC", "IRON", "RETICCTPCT" in the last 72 hours.  Sepsis Labs: Recent Labs  Lab 01/01/22 1950 01/01/22 2311 01/02/22 0516 01/03/22 1032 01/06/22 0141 01/07/22 2141 01/08/22 0818  WBC  --   --  4.7 4.4 3.2* 3.6* 5.1  LATICACIDVEN 4.1* 2.0*  1.6  --   --   --   --    Microbiology Recent Results (from the past 240 hour(s))  Culture, blood (routine x 2)     Status: None   Collection Time: 01/01/22  7:50 PM   Specimen: BLOOD RIGHT WRIST  Result Value Ref Range Status   Specimen Description BLOOD RIGHT WRIST  Final   Special Requests   Final    BOTTLES DRAWN AEROBIC AND ANAEROBIC Blood Culture results may not be optimal due to an inadequate volume of blood received in culture bottles   Culture   Final    NO GROWTH 5 DAYS Performed at [facility], [address] [place], [place] [address]    Report Status 01/06/2022 FINAL  Final  Culture, blood (routine x 2)     Status: None   Collection Time: 01/01/22  8:05 PM   Specimen: BLOOD LEFT HAND  Result Value Ref Range Status   Specimen Description BLOOD LEFT HAND  Final   Special Requests   Final    BOTTLES DRAWN AEROBIC AND ANAEROBIC Blood Culture results may not be optimal due to an inadequate volume of blood received in culture bottles   Culture   Final    NO GROWTH 5 DAYS Performed at [facility], [address] [place], [place] [address]    Report Status  01/06/2022 FINAL  Final  Urine Culture     Status: Abnormal   Collection Time: 01/02/22  1:20 AM   Specimen: In/Out Cath Urine  Result Value Ref Range Status   Specimen Description IN/OUT CATH URINE  Final   Special Requests   Final    NONE Performed at [facility], [address] [place], [place] [address]    Culture MULTIPLE SPECIES PRESENT, SUGGEST RECOLLECTION (A)  Final   Report Status 01/03/2022 FINAL  Final     Medications:    allopurinol  100 mg Oral Daily   aspirin EC  81 mg Oral Daily   atorvastatin  20 mg Oral Daily   cholestyramine  1 packet Oral BID   divalproex  500 mg Oral Q8H   enoxaparin (LOVENOX) injection  30 mg Subcutaneous Q24H   lacosamide  100 mg Oral BID   levETIRAcetam  500 mg Oral BID   lipase/protease/amylase  24,000 Units Oral TID AC   LORazepam  1 mg Intravenous Once   melatonin  3 mg Oral QHS   metoprolol tartrate  25 mg Oral Daily   pantoprazole  40 mg Oral Daily   predniSONE  10 mg Oral Q breakfast   sodium chloride flush  10-40 mL Intracatheter Q12H   sucralfate  1 g Oral TID   Continuous Infusions:  sodium chloride Stopped (01/08/22 0809)      LOS: 7 days   Joseph Art  [facility]  01/08/2022, 9:59 AM

## 2022-01-08 NOTE — Progress Notes (Signed)
Mobility Specialist: Progress Note   01/08/22 1659  Mobility  Activity Ambulated with assistance in hallway  Level of Assistance Contact guard assist, steadying assist  Assistive Device Other (Comment) (HHA)  Distance Ambulated (ft) 100 ft  Activity Response Tolerated well  Mobility Referral Yes  $Mobility charge 1 Mobility   Pt received in the bed and agreeable to mobility. Mod I with bed mobility as well as to stand. Min cues for direction during ambulation. No c/o throughout. Pt back to bed after session with call bell and phone at her side.   [facility] Mobility Specialist Please contact via SecureChat or Rehab office at [phone]

## 2022-01-08 NOTE — Progress Notes (Signed)
OT Treatment Note:  Clinical Impression:  Pt progressing towards OT goals this session. Focus on toilet transfer (min A) peri care (mod A in standing) and vision. Pt eager and willing to participate in further vision assessment -  very inconsistent with results. Trouble following directions. During peripheral testing, Pt would say she could see object before therapist had moved it forward into potential field of vision, saccades slow, requiring head adjustments. (See vision section below for more) Pt needs vision handout and follow up with eye MD post-acute to assess true vision now. Glasses present and used throughout session.     01/08/22 1000  OT Visit Information  Last OT Received On 01/08/22  Assistance Needed +1  History of Present Illness Wanda Valdez is a 76 y.o. female who was found to be weak complaining of blurred vision and headache was brought to the ER; hypotension, shock (hypovolemic or septic), abnormal CT perfusion study concerning for possible stroke; ARF likely from diarrhea, thrombocytopenia, abdominal tenderness, R shoulder pain (had surgery within the past month); long-term EEG monitoring hooked up demonstrated evidence of epileptogenic activity in the right occipital regionwith. S/p INR 01/07/22. PMH includes lymphocytic colitis, HTN.  Precautions  Precautions Fall;Shoulder  Type of Shoulder Precautions recent R shoulder surgery, no sling and unable to locate any precautions/restrictions. Pt states surgery was at [facility] in August 2023  Restrictions  Other Position/Activity Restrictions Uncertain at this time, recent shoulder surgery in [facility] August or Oct 2023??  Pain Assessment  Pain Assessment Faces  Faces Pain Scale 2  Pain Location R shoulder  Pain Descriptors / Indicators Aching  Pain Intervention(s) Monitored during session;Repositioned  Cognition  Arousal/Alertness Awake/alert  Behavior During Therapy WFL for tasks assessed/performed  Overall Cognitive  Status No family/caregiver present to determine baseline cognitive functioning  General Comments During visual assessment, Pt not able to follow for reliable assessment  Upper Extremity Assessment  RUE Deficits / Details R shoulder surgery, uncertain of timeline of surgery and if any current limitations and restrictions. pt not wearing a sling and with very limited AROM  Vision- Assessment  Vision Assessment? Vision impaired- to be further tested in functional context;Yes  Eye Alignment [facility]  Ocular Range of Motion Impaired-to be further tested in functional context  Alignment/Gaze Preference WDL  Tracking/Visual Pursuits Impaired - to be further tested in functional context;Requires cues, head turns, or add eye shifts to track  Saccades Decreased speed of saccadic movement;Impaired - to be further tested in functional context;Additional head turns occurred during testing  Convergence Impaired (comment)  Additional Comments Pt cognitively able to comply with testing. Good effort, but unreliable results  ADL  Overall ADL's  Needs assistance/impaired  Grooming Oral care;Wash/dry face;Moderate assistance;Sitting  Grooming Details (indicate cue type and reason) difficult for ptaient to reach mouth for oral care with RUE or LUE today  Toilet Transfer Minimal assistance;Stand-pivot;BSC/3in1  Toilet Transfer Details (indicate cue type and reason) due to urgency  Toileting- Clothing Manipulation and Hygiene Moderate assistance;Sit to/from stand  Toileting - Clothing Manipulation Details (indicate cue type and reason) peri care in standing, mod A for thoroughness  Functional mobility during ADLs Minimal assistance (1 HHA)  General ADL Comments vision and deficits post-op RUE impacting independence in ADL and mobility  Bed Mobility  Overal bed mobility Needs Assistance  Bed Mobility Supine to Sit  Supine to sit Min guard;HOB elevated  Transfers  Overall transfer level Needs assistance  Equipment  used 1 person hand held assist  Transfers Sit to/from  Stand;Bed to chair/wheelchair/BSC  Sit to Stand Min assist  Bed to/from chair/wheelchair/BSC transfer type: Stand pivot  Stand pivot transfers Min assist  General transfer comment Min assist to seady with sit to stand;  Pt reports since sx needing external assist from furniture and walls etc.  Balance  Overall balance assessment Needs assistance  Sitting-balance support No upper extremity supported  Sitting balance-Leahy Scale Good  Standing balance support Single extremity supported  Standing balance-Leahy Scale Fair  General Comments  General comments (skin integrity, edema, etc.) VSS on RA  OT - End of Session  Equipment Utilized During Treatment Gait belt  Activity Tolerance Patient tolerated treatment well  Patient left in chair;with chair alarm set;with call bell/phone within reach  Nurse Communication Mobility status  OT Assessment/Plan  OT Plan Discharge plan remains appropriate  OT Visit Diagnosis Unsteadiness on feet (R26.81);Other abnormalities of gait and mobility (R26.89);Muscle weakness (generalized) (M62.81)  OT Frequency (ACUTE ONLY) Min 2X/week  Follow Up Recommendations Home health OT (therapist with vision experience)  Assistance recommended at discharge Frequent or constant Supervision/Assistance (need to confirm 24 hour supervision with family)  Patient can return home with the following A lot of help with bathing/dressing/bathroom;A little help with walking and/or transfers;Assistance with cooking/housework;Assist for transportation  OT Equipment BSC/3in1  AM-PAC OT "6 Clicks" Daily Activity Outcome Measure (Version 2)  Help from another person eating meals? 3  Help from another person taking care of personal grooming? 2  Help from another person toileting, which includes using toliet, bedpan, or urinal? 3  Help from another person bathing (including washing, rinsing, drying)? 2  Help from another person to  put on and taking off regular upper body clothing? 2  Help from another person to put on and taking off regular lower body clothing? 2  6 Click Score 14  Progressive Mobility  What is the highest level of mobility based on the progressive mobility assessment? Level 5 (Walks with assist in room/hall) - Balance while stepping forward/back and can walk in room with assist - Complete  Activity Transferred to/from [facility];Ambulated with assistance in room  OT Goal Progression  Progress towards OT goals Progressing toward goals  Acute Rehab OT Goals  Patient Stated Goal get back to independent  OT Goal Formulation With patient  Time For Goal Achievement 01/17/22  Potential to Achieve Goals Good  OT Time Calculation  OT Start Time (ACUTE ONLY) 0857  OT Stop Time (ACUTE ONLY) 0921  OT Time Calculation (min) 24 min  OT General Charges  $OT Visit 1 Visit  OT Treatments  $Self Care/Home Management  23-37 mins   Nyoka Cowden OTR/L Acute Rehabilitation Services Office: [phone]

## 2022-01-08 NOTE — Progress Notes (Addendum)
STROKE TEAM PROGRESS NOTE   INTERVAL HISTORY Patient is sitting up comfortably in bed.  She has not had any clinical seizures and she denies seeing any positive visual phenomena , blurred vision or twitching's on the left side.  Diagnostic cerebral catheter angiogram by Dr. Corliss Skains confirms severe right brachiocephalic origin stenosis.  Discussed with Dr. Corliss Skains who plans to do elective revascularization with angioplasty and stenting after patient has finished inpatient rehab.  Continue dual antiplatelet therapy and aggressive risk factor modification.  In.  Vital signs are stable.  Neurological exam is unchanged. Vitals:   01/07/22 2000 01/08/22 0340 01/08/22 0831 01/08/22 1142  BP: 109/79 125/87 (!) 142/89 136/81  Pulse: 70 70 71 68  Resp: 16 17 18 17   Temp: 98 F (36.7 C) 98 F (36.7 C) (!) 97.5 F (36.4 C) 99 F (37.2 C)  TempSrc: Oral Oral Oral Oral  SpO2: 100% 93% 96% 97%  Weight:      Height:       CBC:  Recent Labs  Lab 01/01/22 1900 01/01/22 1905 01/03/22 1032 01/06/22 0141 01/07/22 2141 01/08/22 0818  WBC 8.0   < > 4.4   < > 3.6* 5.1  NEUTROABS 3.0  --  2.7  --   --   --   HGB 13.2   < > 11.6*   < > 10.3* 8.9*  HCT 40.6   < > 35.1*   < > 32.8* 28.1*  MCV 104.6*   < > 101.2*   < > 104.1* 102.9*  PLT 262   < > 182   < > 125* 128*   < > = values in this interval not displayed.   Basic Metabolic Panel:  Recent Labs  Lab 01/06/22 0345 01/08/22 0818  NA 141 142  K 3.7 3.9  CL 114* 117*  CO2 21* 18*  GLUCOSE 89 93  BUN 9 15  CREATININE 1.08* 1.08*  CALCIUM 8.7* 8.2*   Lipid Panel:  Recent Labs  Lab 01/02/22 0516  CHOL 167  TRIG 45  HDL 82  CHOLHDL 2.0  VLDL 9  LDLCALC 76   HgbA1c:  Recent Labs  Lab 01/02/22 0516  HGBA1C 4.8   Urine Drug Screen:  Recent Labs  Lab 01/02/22 0120  LABOPIA NONE DETECTED  COCAINSCRNUR NONE DETECTED  LABBENZ NONE DETECTED  AMPHETMU NONE DETECTED  THCU NONE DETECTED  LABBARB NONE DETECTED    Alcohol Level   Recent Labs  Lab 01/01/22 1900  ETH <10   IMAGING past 24 hours No results found.  PHYSICAL EXAM  Physical Exam  Constitutional: Appears well-developed and well-nourished. pleasant elderly African-American lady not in distress. Psych: Affect appropriate to situation, calm and cooperative with exam Eyes: No scleral injection HENT: No OP obstrucion MSK: no joint deformities.  Cardiovascular: Normal rate and regular rhythm.  Respiratory: Effort normal, non-labored breathing on room air GI: Soft.  No distension. There is no tenderness.  Skin: WDI  Neuro: Mental Status: Patient is awake, alert, oriented to person, place, month, year, and situation. Patient is able to give a clear and coherent history. No signs of aphasia or neglect. Speech is fluent, attention and concentration are intact.  Cranial Nerves: II: Visual Fields are full with complaints of blurred vision in left eye left peripheral field (chronic for 2 months) Pupils are equal, round, and reactive to light.   III,IV, VI: EOMI without ptosis or diploplia.  V: Facial sensation is symmetric to light touch VII: Facial movement is symmetric resting  and smiling VIII: Hearing is intact to voice X: Palate elevates symmetrically XI: Shoulder shrug is symmetric. XII: Tongue protrudes midline without atrophy or fasciculations.  Motor: Tone is normal. Bulk is normal. 5/5 strength was present in bilateral lower extremities.  Bilateral upper extremities elevate antigravity with some pain limited strength assessment due to right > left shoulder pain. (Total right shoulder arthoplasty in August 2023).  Prominent asterixis noted in both upper extremities Sensory: Sensation is symmetric to light touch and temperature in the arms and legs. No extinction to DSS present.  Cerebellar: No overt dysmetria noted  ASSESSMENT/PLAN [name] is a 76 y.o. female with history of HTN, tobacco use, chronic diarrhea, poor PO intake,  history of hospitalization in December 2022 for hypovolemic shock related to poor intake, and AKI presenting with dizziness, headache, and blurred vision. EMS noted generalized jerking body movements on arrival, though she was able to walk during these movements. On arrival patient was significantly hypotensive with SBP of 60-70s. TNK was not administered due to patient presenting outside of the thrombolytic therapy time window. CTP showed hypoperfusion in the right cerebral hemisphere correlating with significant stenosis of the right innominate. Patient was felt to have cerebral hypoperfusion versus toxic-metabolic encephalopathy with subsequent asterixis versus myoclonic jerking movements in the setting of deranged renal function. 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    Stroke:  right parietal infarct. 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 ASPECTS of 10. CTA head & neck motion degraded. Negative CTA without acute LVO. 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 Diffuse tortuosity of the major arterial vasculature of the head and neck, suggesting chronic underlying hypertension.  CT perfusion No evidence for acute core infarction. 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  2D Echo LVEF 60-65%, left atrial size is moderately dilated. No atrial level shunt  detected.  EEG 11/2 with epileptogenicity arising from the right occipital region, cortical dysfunction in the right hemisphere likely secondary to underlying structural abnormality, postictal state. No seizures noted. May need loop recorder at discharge  LDL 76 HgbA1c 4.8 VTE prophylaxis - Lovenox SQ    Diet   Diet regular Room service appropriate? Yes; Fluid consistency: Thin   No antithrombotic prior to admission, now on aspirin 81 mg and Plavix 75 mg daily with elective right brachiocephalic angioplasty stenting Therapy recommendations:  Home Health OT, Home health SLP, Home health PT Disposition:  pending   Concern for possible seizure EEG as above with possible epileptogenicity from right occipital region and cortical dysfunction in the right hemisphere likely secondary to underlying structural abnormality, postictal state. No seizures noted.  No history of seizures  Continue Keppra 500 mg PO BID Seizure precautions   Hypertension Home meds:  metoprolol  Stable Permissive hypertension (OK if < 220/120) but gradually normalize in 5-7 days Long-term BP goal normotensive  Hypotension with circulatory shock, resolved History of hypovolemic shock related to poor  PO intake  Encourage adequate PO intake  Required pressors briefly on hospital arrival   Hyperlipidemia Home meds:  atorvastatin 20 mg PO, resumed in hospital LDL 76, goal < 70 High intensity statin not indicated as LDL is near goal on current dose  Continue statin at discharge 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 Discontinued Keppra on 01/08/2022 Other Stroke Risk Factors Advanced Age >/= 65  Remote history of Cigarette smoking  Other Active Problems AKI Thrombocytopenia Anemia  Circulatory shock requiring vasopressors on arrival, resolved  Hospital day # 7  Patient will need dual antiplatelet therapy and elective right  brachiocephalic origin stenosis revascularization with angioplasty stenting by Dr. Corliss Skains after she finishes rehab.    Aggressive risk factor modification.   Continue Depakote and Vimpat for electrographic seizures .  Discontinue Keppra.  No family available at the bedside for discussion.  Discussed with patient and Dr. Corliss Skains and Dr. Benjamine Mola.  Stroke team will sign off.  Kindly call for questions.  Greater than 50% time during this 35-minute visit was spent in counseling and coordination of care about her stroke as well as discussion about brachiocephalic artery stenosis and discussion about revascularization and answering questions Discussed with Dr.Vann Delia Heady, MD Medical Director [facility] Stroke Center Pager: [phone] 01/08/2022 2:16 PM  To contact Stroke Continuity provider, please refer to [email or web address]. After hours, contact General Neurology

## 2022-01-08 NOTE — Progress Notes (Signed)
Physical Therapy Treatment Patient Details Name: Wanda Valdez MRN: [id number] DOB: 1945-09-12 Today's Date: 01/08/2022   History of Present Illness Wanda Valdez is a 76 y.o. female who was found to be weak complaining of blurred vision and headache was brought to the ER; hypotension, shock (hypovolemic or septic), abnormal CT perfusion study concerning for possible stroke; ARF likely from diarrhea, thrombocytopenia, abdominal tenderness, R shoulder pain (had surgery within the past month); long-term EEG monitoring hooked up demonstrated evidence of epileptogenic activity in the right occipital regionwith. S/p INR 01/07/22. PMH includes lymphocytic colitis, HTN.    PT Comments    Pt with good progress towards acute goals, however pt continues to be most limited by impaired cognition and visual deficits. Pt needing up to min assist for transfers and gait with HHA throughout with no overt LOB noted, pt needing cues for environmental awareness as pt with tendency to drift to R to touch furniture secondary to visual deficits. Current plan remains appropriate to address deficits and maximize functional independence and safety. Pt continues to benefit from skilled PT services to progress toward functional mobility goals.     Recommendations for follow up therapy are one component of a multi-disciplinary discharge planning process, led by the attending physician.  Recommendations may be updated based on patient status, additional functional criteria and insurance authorization.  Follow Up Recommendations  Home health PT     Assistance Recommended at Discharge Intermittent Supervision/Assistance  Patient can return home with the following A little help with walking and/or transfers   Equipment Recommendations  Rollator (4 wheels)    Recommendations for Other Services       Precautions / Restrictions Precautions Precautions: Fall;Shoulder Type of Shoulder Precautions: recent R  shoulder surgery, no sling and unable to locate any precautions/restrictions. Pt states surgery was at [facility] in August 2023 Restrictions Other Position/Activity Restrictions: Uncertain at this time, recent shoulder surgery in [facility] August or Oct 2023??     Mobility  Bed Mobility Overal bed mobility: Needs Assistance             General bed mobility comments: pt OOB in recliner pre and post session    Transfers Overall transfer level: Needs assistance Equipment used: 1 person hand held assist Transfers: Sit to/from Stand Sit to Stand: Min assist           General transfer comment: assist to steady on rise    Ambulation/Gait Ambulation/Gait assistance: Min assist Gait Distance (Feet): 100 Feet Assistive device: 1 person hand held assist Gait Pattern/deviations: Step-through pattern, Decreased stride length, Trunk flexed Gait velocity: decr     General Gait Details: slow gait with hand held assist, no overt LOB, pt with R bias in hall, walking close to and against furniture secondary to visual deficits, pt able to find room on return with cues   Stairs             Wheelchair Mobility    Modified Rankin (Stroke Patients Only)       Balance Overall balance assessment: Needs assistance Sitting-balance support: No upper extremity supported, Feet unsupported Sitting balance-Leahy Scale: Good     Standing balance support: Single extremity supported, During functional activity Standing balance-Leahy Scale: Fair Standing balance comment: able to static stand without UE suport to wash hands at sink                            Cognition Arousal/Alertness: Awake/alert Behavior During Therapy:  WFL for tasks assessed/performed Overall Cognitive Status: No family/caregiver present to determine baseline cognitive functioning                                 General Comments: pt not oriented to place        Exercises      General  Comments General comments (skin integrity, edema, etc.): VSS on RA      Pertinent Vitals/Pain Pain Assessment Pain Assessment: Faces Faces Pain Scale: Hurts a little bit Pain Location: R shoulder Pain Descriptors / Indicators: Aching Pain Intervention(s): Monitored during session, Limited activity within patient's tolerance    Home Living                          Prior Function            PT Goals (current goals can now be found in the care plan section) Acute Rehab PT Goals PT Goal Formulation: With patient Time For Goal Achievement: 01/17/22 Progress towards PT goals: Progressing toward goals    Frequency    Min 3X/week      PT Plan      Co-evaluation              AM-PAC PT "6 Clicks" Mobility   Outcome Measure  Help needed turning from your back to your side while in a flat bed without using bedrails?: None Help needed moving from lying on your back to sitting on the side of a flat bed without using bedrails?: None Help needed moving to and from a bed to a chair (including a wheelchair)?: A Lot Help needed standing up from a chair using your arms (e.g., wheelchair or bedside chair)?: A Lot Help needed to walk in hospital room?: A Little Help needed climbing 3-5 steps with a railing? : A Lot 6 Click Score: 17    End of Session Equipment Utilized During Treatment: Gait belt Activity Tolerance: Patient tolerated treatment well Patient left: in chair;with call bell/phone within reach;with chair alarm set Nurse Communication: Mobility status PT Visit Diagnosis: Unsteadiness on feet (R26.81);Muscle weakness (generalized) (M62.81)     Time: 1040-1059 PT Time Calculation (min) (ACUTE ONLY): 19 min  Charges:  $Therapeutic Activity: 8-22 mins                     Wanda Valdez R. PTA Acute Rehabilitation Services Office: [phone]    Catalina Antigua 01/08/2022, 11:12 AM

## 2022-01-09 ENCOUNTER — Other Ambulatory Visit (HOSPITAL_COMMUNITY): Payer: Self-pay

## 2022-01-09 DIAGNOSIS — I9589 Other hypotension: Secondary | ICD-10-CM | POA: Diagnosis not present

## 2022-01-09 DIAGNOSIS — I63411 Cerebral infarction due to embolism of right middle cerebral artery: Secondary | ICD-10-CM | POA: Diagnosis not present

## 2022-01-09 DIAGNOSIS — E861 Hypovolemia: Secondary | ICD-10-CM | POA: Diagnosis not present

## 2022-01-09 LAB — CBC
HCT: 24.7 % — ABNORMAL LOW (ref 36.0–46.0)
Hemoglobin: 8.4 g/dL — ABNORMAL LOW (ref 12.0–15.0)
MCH: 33.7 pg (ref 26.0–34.0)
MCHC: 34 g/dL (ref 30.0–36.0)
MCV: 99.2 fL (ref 80.0–100.0)
Platelets: 126 10*3/uL — ABNORMAL LOW (ref 150–400)
RBC: 2.49 MIL/uL — ABNORMAL LOW (ref 3.87–5.11)
RDW: 13.7 % (ref 11.5–15.5)
WBC: 5.1 10*3/uL (ref 4.0–10.5)
nRBC: 0 % (ref 0.0–0.2)

## 2022-01-09 LAB — GLUCOSE, CAPILLARY: Glucose-Capillary: 79 mg/dL (ref 70–99)

## 2022-01-09 LAB — BASIC METABOLIC PANEL
Anion gap: 9 (ref 5–15)
BUN: 18 mg/dL (ref 8–23)
CO2: 17 mmol/L — ABNORMAL LOW (ref 22–32)
Calcium: 8.6 mg/dL — ABNORMAL LOW (ref 8.9–10.3)
Chloride: 117 mmol/L — ABNORMAL HIGH (ref 98–111)
Creatinine, Ser: 1.11 mg/dL — ABNORMAL HIGH (ref 0.44–1.00)
GFR, Estimated: 51 mL/min — ABNORMAL LOW (ref >60)
Glucose, Bld: 87 mg/dL (ref 70–99)
Potassium: 4.2 mmol/L (ref 3.5–5.1)
Sodium: 143 mmol/L (ref 135–145)

## 2022-01-09 MED ORDER — ASPIRIN 81 MG PO TBEC
81.0000 mg | DELAYED_RELEASE_TABLET | Freq: Every day | ORAL | 3 refills | Status: DC
  Filled 2022-01-09: qty 30, 30d supply, fill #0

## 2022-01-09 MED ORDER — CLOPIDOGREL BISULFATE 75 MG PO TABS
75.0000 mg | ORAL_TABLET | Freq: Every day | ORAL | 3 refills | Status: DC
  Filled 2022-01-09: qty 30, 30d supply, fill #0

## 2022-01-09 MED ORDER — LACOSAMIDE 100 MG PO TABS
100.0000 mg | ORAL_TABLET | Freq: Two times a day (BID) | ORAL | 3 refills | Status: DC
  Filled 2022-01-09: qty 60, 30d supply, fill #0

## 2022-01-09 MED ORDER — DIVALPROEX SODIUM 125 MG PO CSDR
500.0000 mg | DELAYED_RELEASE_CAPSULE | Freq: Three times a day (TID) | ORAL | 3 refills | Status: DC
  Filled 2022-01-09: qty 360, 30d supply, fill #0

## 2022-01-09 NOTE — TOC Transition Note (Signed)
Transition of Care [facility]) - CM/SW Discharge Note   Patient Details  Name: Laverle Pillard MRN: [id number] Date of Birth: Jul 31, 1945  Transition of Care [facility]) CM/SW Contact:  Kermit Balo, RN Phone Number: 01/09/2022, 1:03 PM   Clinical Narrative:    Pt is discharging home today with home health services through [facility]. Information on the AVS and Kelly with [facility] aware of d/c.  DME for home in the patients room per [employer].  Pts daughter to provide transport home.   Final next level of care: Home w Home Health Services Barriers to Discharge: No Barriers Identified   Patient Goals and CMS Choice Patient states their goals for this hospitalization and ongoing recovery are:: To return home CMS Medicare.gov Compare Post Acute Care list provided to:: Patient Represenative (must comment) Choice offered to / list presented to : Adult Children  Discharge Placement                       Discharge Plan and Services   Discharge Planning Services: CM Consult Post Acute Care Choice: Home Health          DME Arranged: Bedside commode, Walker rolling with seat DME Agency: [employer] Date DME Agency Contacted: 01/09/22 Time DME Agency Contacted: 1520 Representative spoke with at DME Agency: Lawernce Keas HH Arranged: PT, OT, Speech Therapy HH Agency: [facility] Date [facility] Agency Contacted: 01/09/22 Time HH Agency Contacted: 1515 Representative spoke with at [facility] Agency: Tresa Endo  Social Determinants of Health (SDOH) Interventions     Readmission Risk Interventions     No data to display

## 2022-01-09 NOTE — Discharge Summary (Signed)
Physician Discharge Summary  Wanda Valdez JWJ:[id number] DOB: 25-Mar-1945 DOA: 01/01/2022  PCP: Wanda JamesZakhia, Karl, MD  Admit date: 01/01/2022 Discharge date: 01/09/2022  Admitted From: home Discharge disposition: home with home health and family support   Recommendations for Outpatient Follow-Up:   Asa/plavix per neurology Follow-up with IR for stent once rehabed Home health Outpateint GI follow up for colonoscopy Smoking cessation   Discharge Diagnosis:   Principal Problem:   Sepsis (HCC) Active Problems:   AKI (acute kidney injury) (HCC)   Hypotension due to hypovolemia   Acute cystitis without hematuria   High anion gap metabolic acidosis   Seizures (HCC)   Shock (HCC)   Cerebrovascular accident (CVA) due to embolism of right middle cerebral artery (HCC)    Discharge Condition: Improved.  Diet recommendation: Low sodium, heart healthy.  Wound care: None.  Code status: Full.   History of Present Illness:   Wanda RuaBarbara Valdez is an 76 y.o. female past medical history of lymphocytic colitis, essential hypertension with recent right shoulder surgery about a month ago was found to be weak complaining of blurry vision and headache, accompanied episode of diarrhea unable to get out of bed the day prior to admission comes into the ED code stroke was called as he was also found hypotensive in the ED fluid resuscitated started on Levophed, empiric antibiotics, CT of the head was unremarkable CT perfusion study showed hypoperfusion to the right cerebral hemisphere with a stenosis of the right innominate artery.     Hospital Course by Problem:  No infection causing sepsis  Circulatory shock: Required pressors temporarily for couple of hours.  Of unclear etiology. Question due to seizures. Blood pressure has remained stable. Culture data has remained negative: discontinue antibiotics.   Acute CVA: MRI of the brain showed an acute cortical infarct of the right  parietal region. HgbA1c 8, fasting lipid panel LDL 76. 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 PT, OT evaluated the patient recommended home health. Transthoracic Echo, EF of 55% grade 1 diastolic dysfunction. On statins. BP goal: permissive HTN upto 220/120 mmHg. No events telemetry monitoring. Neurology was consulted recommended aspirin and elective revascularization with angioplasty. 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  -will need outpatient stent -ASA/plavix   Possible seizures: EEG showed evidence of seizures neurology was consulted she was started on AEDs. Clinically she is not exhibiting any manifestation of seizure. -depakote./vimpat -keppra d/c'd  [place] [facility] [place], patients with seizures are not allowed to drive until they have been seizure-free for six months.    Use caution when using heavy equipment or power tools. Avoid working on ladders or at heights. Take showers instead of baths. Ensure the water temperature is not too high on the home water heater. Do not go swimming alone. Do not lock yourself in a room alone (i.e. bathroom). When caring for infants or small children, sit down when holding, feeding, or changing them to minimize risk of injury to the child in the event you have a seizure. Maintain good sleep hygiene. Avoid alcohol.    If patient has another seizure, call 911 and bring them back to the ED if: A.  The seizure lasts longer than 5 minutes.  B.  The patient doesn't wake shortly after the seizure or has new problems such as difficulty seeing, speaking or moving following the seizure C.  The patient was injured  during the seizure D.  The patient has a temperature over 102 F (39C) E.  The patient vomited during the seizure and now is having trouble breathing   Acute confusional state: -appears resolved   Acute kidney injury: -Cr stable around 1 -outpatient follow up   Thrombocytopenia: -minimal   Macrocytic anemia: CBC shows a hemoglobin at baseline, anemia panel showed ferritin 55 will need further evaluation as an out patient with colonoscopy -start on ferrous sulfate upon discharge. Folate: 40   abdominal tenderness: CT of the abdomen pelvis CT of the abdomen showed mild left hydronephrosis no stones, mild bilateral renal atrophy with chronic perinephric stranding,  transverse colitis, small pleural effusions and trace ascites.  No acute abnormalities to explain her diffuse tenderness. Resume Creon. FOBT stools negative.   Hyperglycemia: Likely reactive A1c of 4.8.  Discontinue all insulins.   Essential hypertension: -see above     Medical Consultants:   IR neurology   Discharge Exam:   Vitals:   01/09/22 0400 01/09/22 0815  BP: 134/89 (!) 150/83  Pulse: 69 63  Resp: 18 19  Temp: 97.9 F (36.6 C) 97.9 F (36.6 C)  SpO2: 100% 100%   Vitals:   01/08/22 2000 01/08/22 2323 01/09/22 0400 01/09/22 0815  BP: (!) 148/88 (!) 159/89 134/89 (!) 150/83  Pulse: 68 70 69 63  Resp: 18 18 18 19   Temp: 97.9 F (36.6 C) 97.8 F (36.6 C) 97.9 F (36.6 C) 97.9 F (36.6 C)  TempSrc: Oral Oral Oral Oral  SpO2: 100% 100% 100% 100%  Weight:      Height:        General exam: Appears calm and comfortable.    The results of significant diagnostics from this hospitalization (including imaging, microbiology, ancillary and laboratory) are listed below for reference.     Procedures and Diagnostic Studies:   Overnight EEG with video  Result Date: 01/06/2022 13/08/2021, MD     01/07/2022  9:43 AM Patient Name: Wanda Valdez MRN: Wanda Valdez Epilepsy Attending: [id number] Referring Physician/Provider: Charlsie Quest, MD Duration: 01/05/2022 2220 to 01/06/2022 2220  Patient history: 76 year old female with jerking episodes.  EEG to evaluate for seizure.  Level of alertness: Awake, asleep  AEDs during EEG study: LEV, VPA  Technical aspects: This EEG study was done with scalp electrodes positioned according to the 10-20 International system of electrode placement. Electrical activity was reviewed with band pass filter of 1-70Hz , sensitivity of 7 uV/mm, display speed of 57mm/sec with a 60Hz  notched filter applied as appropriate. EEG data were recorded continuously and digitally stored.  Video monitoring was available and reviewed as appropriate.  Description: No posterior dominant rhythm was seen. Sleep was characterized by sleep spindles (12 to 14 Hz), maximal frontocentral region.  EEG showed 6-9Hz  alpha activity in left hemisphere and continuous 3-5 hz theta-delta slowing in right hemisphere. Lateralized periodic discharges with overriding fast activity at 1hz  were noted in right hemisphere, maximal right occipital region which at times appear rhythmic lasting 2-9 seconds consistent with brief-ictal-interictal rhythmic discharges.  Seizures without clinical signs were noted arising from right occipital region during which EEG showed 2.5 to 3 Hz polyspikes in right occipital region admixed with 4 to 5 Hz theta slowing which gradually evolved into 2 to 3 Hz delta slowing.  Average  3 seizures were seen per hour, lasting about 1.5 minutes.  ABNORMALITY -Seizure without clinical signs, right occipital region - Brief-ictal-interictal rhythmic discharges, right hemisphere, maximal right occipital region - Lateralized periodic discharges with overriding fast activity, ( LPD +F), right hemisphere, maximal right occipital region -Continuous slow, generalized and lateralized right hemisphere  IMPRESSION: This study showed seizures without clinical signs arising from right occipital  region, average 3 seizures per hour lasting about 1.5 minutes. There was also evidence of epileptogenicity arising from right hemisphere, maximal right occipital region with increased risk of seizure recurrence.  Additionally there is cortical dysfunction in right hemisphere likely secondary to underlying structural abnormality. Lastly there is moderate diffuse encephalopathy, non specific etiology.  Wanda Valdez   EEG adult  Result Date: 01/05/2022 Wanda Quest, MD     01/05/2022  5:55 PM Patient Name: Wanda Valdez MRN: [id number] Epilepsy Attending: Charlsie Valdez Referring Physician/Provider: Kara Mead, NP Date: 01/05/2022 Duration: 22.41 mins Patient history: 76 year old female with jerking episodes.  EEG to evaluate for seizure. Level of alertness: Awake AEDs during EEG study: LEV Technical aspects: This EEG study was done with scalp electrodes positioned according to the 10-20 International system of electrode placement. Electrical activity was reviewed with band pass filter of 1-70Hz , sensitivity of 7 uV/mm, display speed of 19mm/sec with a 60Hz  notched filter applied as appropriate. EEG data were recorded continuously and digitally stored.  Video monitoring was available and reviewed as appropriate. Description: No posterior dominant rhythm was seen. EEG showed 6-9Hz  alpha activity in left hemisphere and continuous 3-5 hz theta-delta slowing in right hemisphere . Lateralized periodic discharges with overriding fast activity at 1hz  were noted in right hemisphere, maximal right occipital region which at times appear rhythmic lasting 2-9 seconds consistent with brief-ictal-interictal rhythmic discharges   ABNORMALITY - Brief-ictal-interictal rhythmic discharges, right hemisphere, maximal right occipital region - Lateralized periodic discharges with overriding fast activity, ( LPD +F), right hemisphere, maximal right occipital region -Continuous slow, generalized and lateralized right  hemisphere  IMPRESSION: This study showed evidence of epileptogenicity arising from right hemisphere, maximal right occipital region which is on the ictal-interictal continuum with increased risk of seizure recurrence.  Additionally there is cortical dysfunction in right hemisphere likely secondary to underlying structural abnormality. Lastly there is moderate diffuse encephalopathy, non specific etiology.  Recommend long term eeg for further evaluation      Labs:   Basic Metabolic Panel: Recent Labs  Lab 01/03/22 1032 01/05/22 1138 01/06/22 0345 01/08/22 0818 01/09/22 0457  NA 142 139 141 142 143  K 3.2* 3.8 3.7 3.9 4.2  CL 100 110 114* 117* 117*  CO2 27 19* 21* 18* 17*  GLUCOSE 107* 97 89 93 87  BUN 14 8 9 15 18   CREATININE 1.20* 1.04* 1.08* 1.08* 1.11*  CALCIUM 8.7* 8.9 8.7* 8.2* 8.6*   GFR Estimated Creatinine Clearance: 27 mL/min (A) (by C-G formula based on SCr of 1.11 mg/dL (H)). Liver Function Tests: No results for input(s): "AST", "ALT", "ALKPHOS", "BILITOT", "PROT", "ALBUMIN" in the last 168 hours. No results for input(s): "LIPASE", "AMYLASE" in the last 168 hours. No results for input(s): "AMMONIA" in the last 168 hours. Coagulation profile Recent Labs  Lab 01/06/22 0141  INR 1.0    CBC: Recent Labs  Lab 01/03/22 1032 01/06/22 0141 01/07/22 2141 01/08/22 0818 01/09/22 0457  WBC 4.4 3.2* 3.6* 5.1 5.1  NEUTROABS 2.7  --   --   --   --  HGB 11.6* 11.3* 10.3* 8.9* 8.4*  HCT 35.1* 35.1* 32.8* 28.1* 24.7*  MCV 101.2* 103.5* 104.1* 102.9* 99.2  PLT 182 141* 125* 128* 126*   Cardiac Enzymes: No results for input(s): "CKTOTAL", "CKMB", "CKMBINDEX", "TROPONINI" in the last 168 hours. BNP: Invalid input(s): "POCBNP" CBG: Recent Labs  Lab 01/08/22 1145 01/08/22 1615 01/08/22 2145 01/08/22 2154 01/09/22 0615  GLUCAP 96 108* 96 96 79   D-Dimer No results for input(s): "DDIMER" in the last 72 hours. Hgb A1c No results for input(s):  "HGBA1C" in the last 72 hours. Lipid Profile No results for input(s): "CHOL", "HDL", "LDLCALC", "TRIG", "CHOLHDL", "LDLDIRECT" in the last 72 hours. Thyroid function studies No results for input(s): "TSH", "T4TOTAL", "T3FREE", "THYROIDAB" in the last 72 hours.  Invalid input(s): "FREET3" Anemia work up No results for input(s): "VITAMINB12", "FOLATE", "FERRITIN", "TIBC", "IRON", "RETICCTPCT" in the last 72 hours. Microbiology Recent Results (from the past 240 hour(s))  Culture, blood (routine x 2)     Status: None   Collection Time: 01/01/22  7:50 PM   Specimen: BLOOD RIGHT WRIST  Result Value Ref Range Status   Specimen Description BLOOD RIGHT WRIST  Final   Special Requests   Final    BOTTLES DRAWN AEROBIC AND ANAEROBIC Blood Culture results may not be optimal due to an inadequate volume of blood received in culture bottles   Culture   Final    NO GROWTH 5 DAYS Performed at [facility], [address] [place], [place] [address]    Report Status 01/06/2022 FINAL  Final  Culture, blood (routine x 2)     Status: None   Collection Time: 01/01/22  8:05 PM   Specimen: BLOOD LEFT HAND  Result Value Ref Range Status   Specimen Description BLOOD LEFT HAND  Final   Special Requests   Final    BOTTLES DRAWN AEROBIC AND ANAEROBIC Blood Culture results may not be optimal due to an inadequate volume of blood received in culture bottles   Culture   Final    NO GROWTH 5 DAYS Performed at [facility], [address] [place], [place] [address]    Report Status 01/06/2022 FINAL  Final  Urine Culture     Status: Abnormal   Collection Time: 01/02/22  1:20 AM   Specimen: In/Out Cath Urine  Result Value Ref Range Status   Specimen Description IN/OUT CATH URINE  Final   Special Requests   Final    NONE Performed at [facility], [address], [place], [place] [address]    Culture MULTIPLE SPECIES PRESENT, SUGGEST RECOLLECTION (A)  Final   Report Status 01/03/2022 FINAL   Final     Discharge Instructions:   Discharge Instructions     Ambulatory referral to Neurology   Complete by: As directed    An appointment is requested in approximately: 4 weeks   Diet general   Complete by: As directed    Discharge instructions   Complete by: As directed    Home health Will need outpatient follow up for stent with Dr. Erlene Senters   Increase activity slowly   Complete by: As directed    No wound care   Complete by: As directed       Allergies as of 01/09/2022   No Known Allergies      Medication List     STOP taking these medications    famotidine 40 MG tablet Commonly known as: PEPCID   loperamide 2 MG  capsule Commonly known as: IMODIUM       TAKE these medications    alendronate 35 MG tablet Commonly known as: FOSAMAX Take 35 mg by mouth once a week.   allopurinol 100 MG tablet Commonly known as: ZYLOPRIM Take 100 mg by mouth daily.   aspirin EC 81 MG tablet Take 1 tablet (81 mg total) by mouth daily. Swallow whole. Start taking on: January 10, 2022   atorvastatin 20 MG tablet Commonly known as: LIPITOR Take 20 mg by mouth daily.   cholestyramine 4 g packet Commonly known as: QUESTRAN Take 1 packet by mouth 2 (two) times daily.   clopidogrel 75 MG tablet Commonly known as: PLAVIX Take 1 tablet (75 mg total) by mouth daily. Start taking on: January 10, 2022   Creon 6000-19000 units Cpep Generic drug: Pancrelipase (Lip-Prot-Amyl) Take 2 capsules by mouth 3 (three) times daily.   diphenoxylate-atropine 2.5-0.025 MG tablet Commonly known as: LOMOTIL Take 1 tablet by mouth 2 (two) times daily as needed.   divalproex 125 MG capsule Commonly known as: DEPAKOTE SPRINKLE Take 4 capsules (500 mg total) by mouth every 8 (eight) hours.   EPINEPHrine 0.3 mg/0.3 mL Soaj injection Commonly known as: EPI-PEN SMARTSIG:0.3 Milliliter(s) IM Once PRN   Lacosamide 100 MG Tabs Take 1 tablet (100 mg total) by mouth 2 (two) times  daily.   metoprolol tartrate 25 MG tablet Commonly known as: LOPRESSOR Take 25 mg by mouth daily.   multivitamin with minerals Tabs tablet Take 1 tablet by mouth daily.   pantoprazole 40 MG tablet Commonly known as: PROTONIX Take 1 tablet (40 mg total) by mouth daily.   sucralfate 1 g tablet Commonly known as: CARAFATE Take 1 g by mouth 3 (three) times daily. Dissolve 1 tablet in 2 ounces of water               Durable Medical Equipment  (From admission, onward)           Start     Ordered   01/09/22 1055  For home use only DME 4 wheeled rolling walker with seat  Once       Question:  Patient needs a walker to treat with the following condition  Answer:  Weakness   01/09/22 1054   01/05/22 1606  For home use only DME Bedside commode  Once       Comments: No toilet at level of house where patient will be staying.  Question:  Patient needs a bedside commode to treat with the following condition  Answer:  Gait instability   01/05/22 1606            Follow-up Information     [facility] Follow up.   Specialty: Home Health Services Why: Someone will call you to schedule first home visit. Contact information: [address] [place] [address] [phone]         Wanda James, MD Follow up.   Specialty: Family Medicine Contact information: [address] [place] [address] [phone]         Julieanne Cotton, MD Follow up.   Specialties: Interventional Radiology, Radiology Why: will be scheduled for stent Contact information: [address] [place],  [place] [address] [place] [address] [phone]                  Time coordinating discharge: 45 min  Signed:  Joseph Art DO  [facility] 01/09/2022, 11:49 AM

## 2022-01-09 NOTE — Progress Notes (Signed)
Discharge instructions have been printed and placed in patients belongings bag for daughter to review.  Bedside commode and Wanda Valdez rolling with seat placed in daughters car.

## 2022-01-09 NOTE — Progress Notes (Signed)
Physical Therapy Treatment Patient Details Name: Wanda Valdez MRN: [id number] DOB: [birth year] Today's Date: 01/09/2022   History of Present Illness Wanda Valdez is a 76 y.o. female who was found to be weak complaining of blurred vision and headache was brought to the ER; hypotension, shock (hypovolemic or septic), abnormal CT perfusion study concerning for possible stroke; ARF likely from diarrhea, thrombocytopenia, abdominal tenderness, R shoulder pain (had surgery within the past month); long-term EEG monitoring hooked up demonstrated evidence of epileptogenic activity in the right occipital regionwith. S/p INR 01/07/22. PMH includes lymphocytic colitis, HTN.    PT Comments    PT with good progress towards acute goals this session. Pt awake, alert and oriented throughout session. Session focused on gait with rollator for increased activity tolerance and safety with mobility. Pt with noted increase in gait speed with rollator with larger stride and increased stability, pt endorsing increased stability with rollator and agreeable to use post acutely. Current plan remains appropriate to address deficits and maximize functional independence and decrease caregiver burden. Pt continues to benefit from skilled PT services to progress toward functional mobility goals.    Recommendations for follow up therapy are one component of a multi-disciplinary discharge planning process, led by the attending physician.  Recommendations may be updated based on patient status, additional functional criteria and insurance authorization.  Follow Up Recommendations  Home health PT     Assistance Recommended at Discharge Intermittent Supervision/Assistance  Patient can return home with the following A little help with walking and/or transfers   Equipment Recommendations  Rollator (4 wheels)    Recommendations for Other Services       Precautions / Restrictions Precautions Precautions:  Fall;Shoulder Type of Shoulder Precautions: recent R shoulder surgery, no sling and unable to locate any precautions/restrictions. Pt states surgery was at [facility] in August 2023 Restrictions Weight Bearing Restrictions: No Other Position/Activity Restrictions: Uncertain at this time, recent shoulder surgery in [facility] August or Oct 2023??     Mobility  Bed Mobility Overal bed mobility: Needs Assistance Bed Mobility: Supine to Sit     Supine to sit: Min guard, HOB elevated, Min assist     General bed mobility comments: light assist to scoot to EOB    Transfers Overall transfer level: Needs assistance Equipment used: Rollator (4 wheels) Transfers: Sit to/from Stand Sit to Stand: Min guard           General transfer comment: min guard for safety    Ambulation/Gait Ambulation/Gait assistance: Min guard, Min assist (assist for direction) Gait Distance (Feet): 130 Feet Assistive device: Rollator (4 wheels) Gait Pattern/deviations: Step-through pattern, Decreased stride length, Trunk flexed Gait velocity: decr     General Gait Details: improved gait speed and stability with rollator , no LOB, assist for direction as pt with tendency to drift right secondary to visual deficits, able to navigate back to room with one turn   Stairs             Wheelchair Mobility    Modified Rankin (Stroke Patients Only)       Balance Overall balance assessment: Needs assistance Sitting-balance support: No upper extremity supported, Feet unsupported Sitting balance-Leahy Scale: Good     Standing balance support: Single extremity supported, During functional activity Standing balance-Leahy Scale: Fair Standing balance comment: able to static stand without UE suport to wash hands at sink  Cognition Arousal/Alertness: Awake/alert Behavior During Therapy: WFL for tasks assessed/performed Overall Cognitive Status: No family/caregiver present  to determine baseline cognitive functioning                                 General Comments: A&O x4        Exercises      General Comments General comments (skin integrity, edema, etc.): VSS on RA      Pertinent Vitals/Pain Pain Assessment Pain Assessment: Faces Faces Pain Scale: No hurt Pain Intervention(s): Monitored during session    Home Living                          Prior Function            PT Goals (current goals can now be found in the care plan section) Acute Rehab PT Goals Patient Stated Goal: Did not specifically state PT Goal Formulation: With patient Time For Goal Achievement: 01/17/22    Frequency    Min 3X/week      PT Plan      Co-evaluation              AM-PAC PT "6 Clicks" Mobility   Outcome Measure  Help needed turning from your back to your side while in a flat bed without using bedrails?: None Help needed moving from lying on your back to sitting on the side of a flat bed without using bedrails?: None Help needed moving to and from a bed to a chair (including a wheelchair)?: A Little Help needed standing up from a chair using your arms (e.g., wheelchair or bedside chair)?: A Little Help needed to walk in hospital room?: A Little Help needed climbing 3-5 steps with a railing? : A Lot 6 Click Score: 19    End of Session Equipment Utilized During Treatment: Gait belt Activity Tolerance: Patient tolerated treatment well Patient left: in chair;with call bell/phone within reach;with chair alarm set Nurse Communication: Mobility status PT Visit Diagnosis: Unsteadiness on feet (R26.81);Muscle weakness (generalized) (M62.81)     Time: 3785-8850 PT Time Calculation (min) (ACUTE ONLY): 20 min  Charges:  $Gait Training: 8-22 mins                    Wanda Valdez R. PTA Acute Rehabilitation Services Office: [phone]    Catalina Antigua 01/09/2022, 11:22 AM

## 2022-01-09 NOTE — Progress Notes (Signed)
Mobility Specialist: Progress Note   01/09/22 1345  Mobility  Activity Ambulated with assistance in hallway  Level of Assistance Contact guard assist, steadying assist  Assistive Device Four wheel walker  Distance Ambulated (ft) 200 ft  Activity Response Tolerated well  Mobility Referral Yes  $Mobility charge 1 Mobility   Received pt in chair having no complaints and agreeable to mobility. Pt was asymptomatic throughout ambulation and returned to room w/o fault. Left in chair w/ call bell in reach and all needs met. Chair alarm is on.  Gillis Garyn [place] Mobility Specialist Please contact via SecureChat or Rehab office at [phone]

## 2022-01-09 NOTE — Discharge Instructions (Addendum)
STOP SMOKING Per [facility] statutes, patients with seizures are not allowed to drive until they have been seizure-free for six months.    Use caution when using heavy equipment or power tools. Avoid working on ladders or at heights. Take showers instead of baths. Ensure the water temperature is not too high on the home water heater. Do not go swimming alone. Do not lock yourself in a room alone (i.e. bathroom). When caring for infants or small children, sit down when holding, feeding, or changing them to minimize risk of injury to the child in the event you have a seizure. Maintain good sleep hygiene. Avoid alcohol.    If patient has another seizure, call 911 and bring them back to the ED if: A.  The seizure lasts longer than 5 minutes.      B.  The patient doesn't wake shortly after the seizure or has new problems such as difficulty seeing, speaking or moving following the seizure C.  The patient was injured during the seizure D.  The patient has a temperature over 102 F (39C) E.  The patient vomited during the seizure and now is having trouble breathing

## 2022-01-12 ENCOUNTER — Other Ambulatory Visit (HOSPITAL_COMMUNITY): Payer: Self-pay | Admitting: Interventional Radiology

## 2022-01-12 DIAGNOSIS — I771 Stricture of artery: Secondary | ICD-10-CM

## 2022-01-28 ENCOUNTER — Emergency Department (HOSPITAL_BASED_OUTPATIENT_CLINIC_OR_DEPARTMENT_OTHER): Payer: Medicare Other

## 2022-01-28 ENCOUNTER — Inpatient Hospital Stay (HOSPITAL_BASED_OUTPATIENT_CLINIC_OR_DEPARTMENT_OTHER)
Admission: EM | Admit: 2022-01-28 | Discharge: 2022-02-17 | DRG: 377 | Disposition: A | Discharge status: Skilled Nursing Facility | Payer: Medicare Other | Attending: Internal Medicine | Admitting: Internal Medicine

## 2022-01-28 ENCOUNTER — Encounter (HOSPITAL_COMMUNITY): Payer: Self-pay

## 2022-01-28 ENCOUNTER — Encounter (HOSPITAL_BASED_OUTPATIENT_CLINIC_OR_DEPARTMENT_OTHER): Payer: Self-pay | Admitting: Urology

## 2022-01-28 ENCOUNTER — Other Ambulatory Visit: Payer: Self-pay

## 2022-01-28 DIAGNOSIS — Z515 Encounter for palliative care: Secondary | ICD-10-CM

## 2022-01-28 DIAGNOSIS — E876 Hypokalemia: Secondary | ICD-10-CM | POA: Diagnosis present

## 2022-01-28 DIAGNOSIS — I10 Essential (primary) hypertension: Secondary | ICD-10-CM | POA: Diagnosis present

## 2022-01-28 DIAGNOSIS — D531 Other megaloblastic anemias, not elsewhere classified: Secondary | ICD-10-CM | POA: Diagnosis present

## 2022-01-28 DIAGNOSIS — Z23 Encounter for immunization: Secondary | ICD-10-CM | POA: Diagnosis not present

## 2022-01-28 DIAGNOSIS — E43 Unspecified severe protein-calorie malnutrition: Secondary | ICD-10-CM | POA: Diagnosis present

## 2022-01-28 DIAGNOSIS — K31811 Angiodysplasia of stomach and duodenum with bleeding: Secondary | ICD-10-CM | POA: Diagnosis present

## 2022-01-28 DIAGNOSIS — K269 Duodenal ulcer, unspecified as acute or chronic, without hemorrhage or perforation: Secondary | ICD-10-CM | POA: Diagnosis not present

## 2022-01-28 DIAGNOSIS — Z20822 Contact with and (suspected) exposure to covid-19: Secondary | ICD-10-CM | POA: Diagnosis present

## 2022-01-28 DIAGNOSIS — D62 Acute posthemorrhagic anemia: Secondary | ICD-10-CM | POA: Diagnosis present

## 2022-01-28 DIAGNOSIS — F0392 Unspecified dementia, unspecified severity, with psychotic disturbance: Secondary | ICD-10-CM | POA: Diagnosis present

## 2022-01-28 DIAGNOSIS — F17210 Nicotine dependence, cigarettes, uncomplicated: Secondary | ICD-10-CM | POA: Diagnosis present

## 2022-01-28 DIAGNOSIS — R451 Restlessness and agitation: Secondary | ICD-10-CM

## 2022-01-28 DIAGNOSIS — Z7189 Other specified counseling: Secondary | ICD-10-CM

## 2022-01-28 DIAGNOSIS — Z7952 Long term (current) use of systemic steroids: Secondary | ICD-10-CM

## 2022-01-28 DIAGNOSIS — G40909 Epilepsy, unspecified, not intractable, without status epilepticus: Secondary | ICD-10-CM | POA: Diagnosis present

## 2022-01-28 DIAGNOSIS — K2970 Gastritis, unspecified, without bleeding: Secondary | ICD-10-CM | POA: Diagnosis not present

## 2022-01-28 DIAGNOSIS — G9341 Metabolic encephalopathy: Secondary | ICD-10-CM | POA: Diagnosis not present

## 2022-01-28 DIAGNOSIS — Z681 Body mass index (BMI) 19 or less, adult: Secondary | ICD-10-CM

## 2022-01-28 DIAGNOSIS — Z8249 Family history of ischemic heart disease and other diseases of the circulatory system: Secondary | ICD-10-CM

## 2022-01-28 DIAGNOSIS — Z79899 Other long term (current) drug therapy: Secondary | ICD-10-CM | POA: Diagnosis not present

## 2022-01-28 DIAGNOSIS — R64 Cachexia: Secondary | ICD-10-CM | POA: Diagnosis present

## 2022-01-28 DIAGNOSIS — R627 Adult failure to thrive: Secondary | ICD-10-CM | POA: Diagnosis present

## 2022-01-28 DIAGNOSIS — K264 Chronic or unspecified duodenal ulcer with hemorrhage: Secondary | ICD-10-CM | POA: Diagnosis present

## 2022-01-28 DIAGNOSIS — R1310 Dysphagia, unspecified: Secondary | ICD-10-CM | POA: Diagnosis present

## 2022-01-28 DIAGNOSIS — Z7902 Long term (current) use of antithrombotics/antiplatelets: Secondary | ICD-10-CM

## 2022-01-28 DIAGNOSIS — Z90710 Acquired absence of both cervix and uterus: Secondary | ICD-10-CM

## 2022-01-28 DIAGNOSIS — K259 Gastric ulcer, unspecified as acute or chronic, without hemorrhage or perforation: Secondary | ICD-10-CM | POA: Diagnosis not present

## 2022-01-28 DIAGNOSIS — R54 Age-related physical debility: Secondary | ICD-10-CM | POA: Diagnosis present

## 2022-01-28 DIAGNOSIS — I63411 Cerebral infarction due to embolism of right middle cerebral artery: Secondary | ICD-10-CM | POA: Diagnosis present

## 2022-01-28 DIAGNOSIS — K922 Gastrointestinal hemorrhage, unspecified: Secondary | ICD-10-CM

## 2022-01-28 DIAGNOSIS — K254 Chronic or unspecified gastric ulcer with hemorrhage: Secondary | ICD-10-CM | POA: Diagnosis present

## 2022-01-28 DIAGNOSIS — Z8673 Personal history of transient ischemic attack (TIA), and cerebral infarction without residual deficits: Secondary | ICD-10-CM

## 2022-01-28 DIAGNOSIS — Z7982 Long term (current) use of aspirin: Secondary | ICD-10-CM

## 2022-01-28 DIAGNOSIS — K31819 Angiodysplasia of stomach and duodenum without bleeding: Secondary | ICD-10-CM | POA: Diagnosis not present

## 2022-01-28 DIAGNOSIS — Z66 Do not resuscitate: Secondary | ICD-10-CM | POA: Diagnosis present

## 2022-01-28 DIAGNOSIS — R52 Pain, unspecified: Secondary | ICD-10-CM | POA: Diagnosis not present

## 2022-01-28 DIAGNOSIS — F0394 Unspecified dementia, unspecified severity, with anxiety: Secondary | ICD-10-CM | POA: Diagnosis present

## 2022-01-28 DIAGNOSIS — D649 Anemia, unspecified: Principal | ICD-10-CM

## 2022-01-28 DIAGNOSIS — R4589 Other symptoms and signs involving emotional state: Secondary | ICD-10-CM

## 2022-01-28 DIAGNOSIS — K921 Melena: Secondary | ICD-10-CM | POA: Diagnosis not present

## 2022-01-28 DIAGNOSIS — R569 Unspecified convulsions: Secondary | ICD-10-CM

## 2022-01-28 DIAGNOSIS — K2981 Duodenitis with bleeding: Secondary | ICD-10-CM | POA: Diagnosis present

## 2022-01-28 DIAGNOSIS — Z751 Person awaiting admission to adequate facility elsewhere: Secondary | ICD-10-CM

## 2022-01-28 DIAGNOSIS — J690 Pneumonitis due to inhalation of food and vomit: Secondary | ICD-10-CM | POA: Diagnosis not present

## 2022-01-28 DIAGNOSIS — Z7983 Long term (current) use of bisphosphonates: Secondary | ICD-10-CM

## 2022-01-28 HISTORY — DX: Cerebral infarction, unspecified: I63.9

## 2022-01-28 LAB — CBC
HCT: 22.2 % — ABNORMAL LOW (ref 36.0–46.0)
Hemoglobin: 7 g/dL — ABNORMAL LOW (ref 12.0–15.0)
MCH: 32.7 pg (ref 26.0–34.0)
MCHC: 31.5 g/dL (ref 30.0–36.0)
MCV: 103.7 fL — ABNORMAL HIGH (ref 80.0–100.0)
Platelets: 245 10*3/uL (ref 150–400)
RBC: 2.14 MIL/uL — ABNORMAL LOW (ref 3.87–5.11)
RDW: 15.3 % (ref 11.5–15.5)
WBC: 8 10*3/uL (ref 4.0–10.5)
nRBC: 0 % (ref 0.0–0.2)

## 2022-01-28 LAB — COMPREHENSIVE METABOLIC PANEL
ALT: 10 U/L (ref 0–44)
AST: 24 U/L (ref 15–41)
Albumin: 2.4 g/dL — ABNORMAL LOW (ref 3.5–5.0)
Alkaline Phosphatase: 70 U/L (ref 38–126)
Anion gap: 9 (ref 5–15)
BUN: 26 mg/dL — ABNORMAL HIGH (ref 8–23)
CO2: 23 mmol/L (ref 22–32)
Calcium: 8.9 mg/dL (ref 8.9–10.3)
Chloride: 107 mmol/L (ref 98–111)
Creatinine, Ser: 1.18 mg/dL — ABNORMAL HIGH (ref 0.44–1.00)
GFR, Estimated: 47 mL/min — ABNORMAL LOW (ref >60)
Glucose, Bld: 113 mg/dL — ABNORMAL HIGH (ref 70–99)
Potassium: 3.8 mmol/L (ref 3.5–5.1)
Sodium: 139 mmol/L (ref 135–145)
Total Bilirubin: 0.6 mg/dL (ref 0.3–1.2)
Total Protein: 6.6 g/dL (ref 6.5–8.1)

## 2022-01-28 LAB — DIFFERENTIAL
Abs Immature Granulocytes: 0.09 10*3/uL — ABNORMAL HIGH (ref 0.00–0.07)
Basophils Absolute: 0.1 10*3/uL (ref 0.0–0.1)
Basophils Relative: 1 %
Eosinophils Absolute: 0.1 10*3/uL (ref 0.0–0.5)
Eosinophils Relative: 1 %
Immature Granulocytes: 1 %
Lymphocytes Relative: 21 %
Lymphs Abs: 1.7 10*3/uL (ref 0.7–4.0)
Monocytes Absolute: 0.7 10*3/uL (ref 0.1–1.0)
Monocytes Relative: 8 %
Neutro Abs: 5.4 10*3/uL (ref 1.7–7.7)
Neutrophils Relative %: 68 %

## 2022-01-28 LAB — URINALYSIS, ROUTINE W REFLEX MICROSCOPIC
Bilirubin Urine: NEGATIVE
Glucose, UA: NEGATIVE mg/dL
Ketones, ur: NEGATIVE mg/dL
Leukocytes,Ua: NEGATIVE
Nitrite: NEGATIVE
Protein, ur: NEGATIVE mg/dL
Specific Gravity, Urine: 1.01 (ref 1.005–1.030)
pH: 7 (ref 5.0–8.0)

## 2022-01-28 LAB — RESP PANEL BY RT-PCR (FLU A&B, COVID) ARPGX2
Influenza A by PCR: NEGATIVE
Influenza B by PCR: NEGATIVE
SARS Coronavirus 2 by RT PCR: NEGATIVE

## 2022-01-28 LAB — TROPONIN I (HIGH SENSITIVITY)
Troponin I (High Sensitivity): 7 ng/L (ref <18)
Troponin I (High Sensitivity): 6 ng/L (ref <18)

## 2022-01-28 LAB — ETHANOL: Alcohol, Ethyl (B): <10 mg/dL (ref <10)

## 2022-01-28 LAB — URINALYSIS, MICROSCOPIC (REFLEX): WBC, UA: NONE SEEN WBC/hpf (ref 0–5)

## 2022-01-28 LAB — VALPROIC ACID LEVEL: Valproic Acid Lvl: 43 ug/mL — ABNORMAL LOW (ref 50.0–100.0)

## 2022-01-28 LAB — RAPID URINE DRUG SCREEN, HOSP PERFORMED
Amphetamines: NOT DETECTED
Barbiturates: NOT DETECTED
Benzodiazepines: NOT DETECTED
Cocaine: NOT DETECTED
Opiates: NOT DETECTED
Tetrahydrocannabinol: NOT DETECTED

## 2022-01-28 LAB — CBG MONITORING, ED: Glucose-Capillary: 84 mg/dL (ref 70–99)

## 2022-01-28 LAB — OCCULT BLOOD X 1 CARD TO LAB, STOOL: Fecal Occult Bld: POSITIVE — AB

## 2022-01-28 LAB — PROTIME-INR
INR: 1.1 (ref 0.8–1.2)
Prothrombin Time: 14.3 seconds (ref 11.4–15.2)

## 2022-01-28 LAB — APTT: aPTT: 35 seconds (ref 24–36)

## 2022-01-28 MED ORDER — INFLUENZA VAC A&B SA ADJ QUAD 0.5 ML IM PRSY
0.5000 mL | PREFILLED_SYRINGE | INTRAMUSCULAR | Status: DC
  Filled 2022-01-28: qty 0.5

## 2022-01-28 MED ORDER — PANTOPRAZOLE SODIUM 40 MG IV SOLR
INTRAVENOUS | Status: AC
  Filled 2022-01-28: qty 20

## 2022-01-28 MED ORDER — IOHEXOL 350 MG/ML SOLN
75.0000 mL | Freq: Once | INTRAVENOUS | Status: AC | PRN
  Administered 2022-01-28: 75 mL via INTRAVENOUS

## 2022-01-28 MED ORDER — PNEUMOCOCCAL 20-VAL CONJ VACC 0.5 ML IM SUSY
0.5000 mL | PREFILLED_SYRINGE | INTRAMUSCULAR | Status: DC
  Filled 2022-01-28: qty 0.5

## 2022-01-28 MED ORDER — PANTOPRAZOLE INFUSION (NEW) - SIMPLE MED
8.0000 mg/h | INTRAVENOUS | Status: DC
  Administered 2022-01-28 – 2022-01-30 (×4): 8 mg/h via INTRAVENOUS
  Filled 2022-01-28: qty 100
  Filled 2022-01-28: qty 80
  Filled 2022-01-28 (×3): qty 100
  Filled 2022-01-28: qty 80
  Filled 2022-01-28: qty 100

## 2022-01-28 MED ORDER — SODIUM CHLORIDE 0.9 % IV BOLUS
1000.0000 mL | Freq: Once | INTRAVENOUS | Status: AC
  Administered 2022-01-28: 1000 mL via INTRAVENOUS

## 2022-01-28 MED ORDER — PANTOPRAZOLE 80MG IVPB - SIMPLE MED
80.0000 mg | Freq: Once | INTRAVENOUS | Status: AC
  Administered 2022-01-28: 80 mg via INTRAVENOUS
  Filled 2022-01-28: qty 100

## 2022-01-28 NOTE — Progress Notes (Signed)
Called daughter, Cam Harnden to make her aware that her mother is here at [facility] in room [phone]. Advised her that she seems a bit confused, daughter states that is part of the reason she is at the hospital.

## 2022-01-28 NOTE — ED Notes (Signed)
RN called and updated pt's daughter, Alene Mires, about pt being transferred to [facility]. Daughter updated on POC.

## 2022-01-28 NOTE — H&P (Signed)
PCP:   Raquel James, MD   Chief Complaint:  Weakness  HPI: This is a 76 year old female with PMHx of lymphocytic colitis, HTN, recent right shoulder surgery.  She was also recently admitted 01/01/2022 to 01/09/2022.  During that hospitalization she was diagnosed with acute cystitis and acute CVA, started on aspirin and Plavix.  She also had a EEG that showed evidence of seizures.  She was discharged home with home health.  Since discharge, patient states she is continue to feel ill.    She went to [facility], where labs revealed hemoglobin of 7, down from 8.4 01/09/2022.  Patient with heme positive stool.  Hospitalist have been asked  Review of Systems:  The patient denies anorexia, fever, weight loss,, vision loss, decreased hearing, hoarseness, chest pain, syncope, dyspnea on exertion, peripheral edema, balance deficits, hemoptysis, abdominal pain, melena, hematochezia, severe indigestion/heartburn, hematuria, incontinence, genital sores, muscle weakness, suspicious skin lesions, transient blindness, difficulty walking, depression, unusual weight change, abnormal bleeding, enlarged lymph nodes, angioedema, and breast masses. Positives: Weakness, heme positive stool  Past Medical History: Past Medical History:  Diagnosis Date   Hypertension    Stroke [facility])    Past Surgical History:  Procedure Laterality Date   ABDOMINAL HYSTERECTOMY     BIOPSY  09/11/2020   Procedure: BIOPSY;  Surgeon: Kathi Der, MD;  Location: [facility] ENDOSCOPY;  Service: Gastroenterology;;   BIOPSY  09/13/2020   Procedure: BIOPSY;  Surgeon: Kathi Der, MD;  Location: [facility] ENDOSCOPY;  Service: Gastroenterology;;   BIOPSY  01/29/2021   Procedure: BIOPSY;  Surgeon: Vida Rigger, MD;  Location: [facility] ENDOSCOPY;  Service: Endoscopy;;   COLONOSCOPY N/A 09/13/2020   Procedure: COLONOSCOPY;  Surgeon: Kathi Der, MD;  Location: [facility] ENDOSCOPY;  Service: Gastroenterology;  Laterality: N/A;    ESOPHAGOGASTRODUODENOSCOPY N/A 09/11/2020   Procedure: ESOPHAGOGASTRODUODENOSCOPY (EGD);  Surgeon: Kathi Der, MD;  Location: [facility] ENDOSCOPY;  Service: Gastroenterology;  Laterality: N/A;   ESOPHAGOGASTRODUODENOSCOPY (EGD) WITH PROPOFOL N/A 01/29/2021   Procedure: ESOPHAGOGASTRODUODENOSCOPY (EGD) WITH PROPOFOL;  Surgeon: Vida Rigger, MD;  Location: [facility] ENDOSCOPY;  Service: Endoscopy;  Laterality: N/A;  With dilation as well and will need ultraslim endoscope in the room   IR ANGIO INTRA EXTRACRAN SEL COM CAROTID INNOMINATE BILAT MOD SED  01/07/2022   POLYPECTOMY  09/13/2020   Procedure: POLYPECTOMY;  Surgeon: Kathi Der, MD;  Location: [facility] ENDOSCOPY;  Service: Gastroenterology;;   Gaspar Bidding DILATION N/A 01/29/2021   Procedure: Gaspar Bidding DILATION;  Surgeon: Vida Rigger, MD;  Location: [facility]COPY;  Service: Endoscopy;  Laterality: N/A;    Medications: Prior to Admission medications   Medication Sig Start Date End Date Taking? Authorizing Provider  alendronate (FOSAMAX) 35 MG tablet Take 35 mg by mouth once a week. 03/28/20   [provider]  allopurinol (ZYLOPRIM) 100 MG tablet Take 100 mg by mouth daily. 08/10/20   [provider]  aspirin EC 81 MG tablet Take 1 tablet (81 mg total) by mouth daily. Swallow whole. 01/10/22   Joseph Art, DO  atorvastatin (LIPITOR) 20 MG tablet Take 20 mg by mouth daily. 10/01/20   [provider]  cholestyramine (QUESTRAN) 4 g packet Take 1 packet by mouth 2 (two) times daily. 10/10/20   [provider]  clopidogrel (PLAVIX) 75 MG tablet Take 1 tablet (75 mg total) by mouth daily. 01/10/22   Marlin Canary U, DO  CREON 6000-19000 units CPEP Take 2 capsules by mouth 3 (three) times daily. 05/11/20   [provider]  diphenoxylate-atropine (LOMOTIL) 2.5-0.025 MG tablet  Take 1 tablet by mouth 2 (two) times daily as needed. 12/16/20   [provider]  divalproex (DEPAKOTE SPRINKLE) 125 MG capsule Take 4 capsules  (500 mg total) by mouth every 8 (eight) hours. 01/09/22   Joseph Art, DO  EPINEPHrine 0.3 mg/0.3 mL IJ SOAJ injection SMARTSIG:0.3 Milliliter(s) IM Once PRN 10/01/20   [provider]  Lacosamide 100 MG TABS Take 1 tablet (100 mg total) by mouth 2 (two) times daily. 01/09/22   Joseph Art, DO  metoprolol tartrate (LOPRESSOR) 25 MG tablet Take 25 mg by mouth daily. 01/01/21   [provider]  Multiple Vitamin (MULTIVITAMIN WITH MINERALS) TABS tablet Take 1 tablet by mouth daily. 09/14/20   Pahwani, Kasandra Knudsen, MD  pantoprazole (PROTONIX) 40 MG tablet Take 1 tablet (40 mg total) by mouth daily. 09/14/20   Pahwani, Kasandra Knudsen, MD  sucralfate (CARAFATE) 1 g tablet Take 1 g by mouth 3 (three) times daily. Dissolve 1 tablet in 2 ounces of water 05/27/20   [provider]    Allergies:  No Known Allergies  Social History:  reports that she has been smoking cigarettes. She has never used smokeless tobacco. She reports current alcohol use. She reports that she does not use drugs.  Family History: Family History  Problem Relation Age of Onset   Cirrhosis Mother    Heart attack Father     Physical Exam: Vitals:   01/28/22 2215 01/28/22 2230 01/28/22 2236 01/28/22 2324  BP: (!) 168/68 (!) 154/72  (!) 170/80  Pulse: 71 70  71  Resp: 18 (!) 23  18  Temp:   97.6 F (36.4 C) 97.8 F (36.6 C)  TempSrc:   Oral Oral  SpO2: 100% 100%  100%  Weight:      Height:        General:  Alert and oriented times three, well developed and nourished, weak, frail elderly female. Eyes: PERRLA, pink conjunctiva, no scleral icterus ENT: Moist oral mucosa, neck supple, no thyromegaly Lungs: clear to ascultation, no wheeze, no crackles, no use of accessory muscles Cardiovascular: regular rate and rhythm, no regurgitation, no gallops, no murmurs. No carotid bruits, no JVD Abdomen: soft, positive BS, non-tender, non-distended, no organomegaly, not an acute abdomen GU: not examined Neuro:  CN II - XII grossly intact, sensation intact Musculoskeletal: strength 5/5 all extremities, no clubbing, cyanosis or edema Skin: no rash, no subcutaneous crepitation, no decubitus Psych: appropriate patient   Labs on Admission:  Recent Labs    01/28/22 1713  NA 139  K 3.8  CL 107  CO2 23  GLUCOSE 113*  BUN 26*  CREATININE 1.18*  CALCIUM 8.9   Recent Labs    01/28/22 1713  AST 24  ALT 10  ALKPHOS 70  BILITOT 0.6  PROT 6.6  ALBUMIN 2.4*    Recent Labs    01/28/22 1713  WBC 8.0  NEUTROABS 5.4  HGB 7.0*  HCT 22.2*  MCV 103.7*  PLT 245    Micro Results: Recent Results (from the past 240 hour(s))  Resp Panel by RT-PCR (Flu A&B, Covid) Anterior Nasal Swab     Status: None   Collection Time: 01/28/22  5:20 PM   Specimen: Anterior Nasal Swab  Result Value Ref Range Status   SARS Coronavirus 2 by RT PCR NEGATIVE NEGATIVE Final    Comment: (NOTE) SARS-CoV-2 target nucleic acids are NOT DETECTED.  The SARS-CoV-2 RNA is generally detectable in upper respiratory specimens during the acute phase of  infection. The lowest concentration of SARS-CoV-2 viral copies this assay can detect is 138 copies/mL. A negative result does not preclude SARS-Cov-2 infection and should not be used as the sole basis for treatment or other patient management decisions. A negative result may occur with  improper specimen collection/handling, submission of specimen other than nasopharyngeal swab, presence of viral mutation(s) within the areas targeted by this assay, and inadequate number of viral copies(<138 copies/mL). A negative result must be combined with clinical observations, patient history, and epidemiological information. The expected result is Negative.  Fact Sheet for Patients:  BloggerCourse.comhttps://www.fda.gov/media/152166/download  Fact Sheet for Healthcare Providers:  SeriousBroker.ithttps://www.fda.gov/media/152162/download  This test is no t yet approved or cleared by the [place] FDA and   has been authorized for detection and/or diagnosis of SARS-CoV-2 by FDA under an Emergency Use Authorization (EUA). This EUA will remain  in effect (meaning this test can be used) for the duration of the COVID-19 declaration under Section 564(b)(1) of the Act, 21 U.S.C.section 360bbb-3(b)(1), unless the authorization is terminated  or revoked sooner.       Influenza A by PCR NEGATIVE NEGATIVE Final   Influenza B by PCR NEGATIVE NEGATIVE Final    Comment: (NOTE) The Xpert Xpress SARS-CoV-2/FLU/RSV plus assay is intended as an aid in the diagnosis of influenza from Nasopharyngeal swab specimens and should not be used as a sole basis for treatment. Nasal washings and aspirates are unacceptable for Xpert Xpress SARS-CoV-2/FLU/RSV testing.  Fact Sheet for Patients: BloggerCourse.comhttps://www.fda.gov/media/152166/download  Fact Sheet for Healthcare Providers: SeriousBroker.ithttps://www.fda.gov/media/152162/download  This test is not yet approved or cleared by the [place] FDA and has been authorized for detection and/or diagnosis of SARS-CoV-2 by FDA under an Emergency Use Authorization (EUA). This EUA will remain in effect (meaning this test can be used) for the duration of the COVID-19 declaration under Section 564(b)(1) of the Act, 21 U.S.C. section 360bbb-3(b)(1), unless the authorization is terminated or revoked.  Performed at [facility] [place], [address] [place], [place] [address]      Radiological Exams on Admission: CT ANGIO HEAD NECK W WO CM  Result Date: 01/28/2022 CLINICAL DATA:  Stroke follow-up. Stroke 4 weeks ago now with difficulty walking, memory, incontinence of bowel and bladder EXAM: CT ANGIOGRAPHY HEAD AND NECK TECHNIQUE: Multidetector CT imaging of the head and neck was performed using the standard protocol during bolus administration of intravenous contrast. Multiplanar CT image reconstructions and MIPs were obtained to evaluate the vascular anatomy. Carotid stenosis  measurements (when applicable) are obtained utilizing NASCET criteria, using the distal internal carotid diameter as the denominator. RADIATION DOSE REDUCTION: This exam was performed according to the departmental dose-optimization program which includes automated exposure control, adjustment of the mA and/or kV according to patient size and/or use of iterative reconstruction technique. CONTRAST:  75mL OMNIPAQUE IOHEXOL 350 MG/ML SOLN COMPARISON:  MRI head 01/04/2022.  CT angio head neck 01/01/2022 FINDINGS: CT HEAD FINDINGS Brain: Hypodensity in the right posterior parietal lobe. This is site of restricted diffusion on 01/04/2022 and is compatible with resolving infarct. No new infarct. No hemorrhage or mass. Ventricle size normal. Vascular: Negative for hyperdense vessel Skull: Negative Sinuses/Orbits: Small air-fluid levels in the maxillary sinus bilaterally. Remaining sinuses clear. Bilateral cataract extraction Other: None Review of the MIP images confirms the above findings CTA NECK FINDINGS Aortic arch: Atherosclerotic calcification in the aortic arch. 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 Right carotid system: Atherosclerotic calcification right carotid bifurcation and proximal right internal carotid artery.  Proximally 25% diameter stenosis proximal right internal carotid artery unchanged from the prior study. Left carotid system: Atherosclerotic calcification left carotid bifurcation and proximal left internal carotid artery. No significant stenosis. Vertebral arteries: Left vertebral artery dominant. Both vertebral arteries patent to the skull base without stenosis. 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 Cervical spondylosis. No acute skeletal abnormality. Other neck: Negative for mass or adenopathy Upper chest: Lung apices clear bilaterally. Review of the MIP images confirms the above findings CTA HEAD FINDINGS Anterior circulation: Mild atherosclerotic disease  in the cavernous carotid bilaterally. No significant stenosis. Anterior and middle cerebral arteries patent bilaterally without stenosis or large vessel occlusion. Posterior circulation: Both vertebral arteries patent to the basilar. PICA patent bilaterally. Basilar patent. Fetal origin posterior cerebral artery bilaterally. No stenosis or large vessel occlusion. Venous sinuses: Normal venous enhancement Anatomic variants: None Review of the MIP images confirms the above findings IMPRESSION: 1. Evolving infarct right posterior parietal lobe. No acute intracranial abnormality. 2. Atherosclerotic disease in the aortic arch and carotid bifurcation bilaterally. 25% diameter stenosis proximal right internal carotid artery unchanged. No significant left carotid stenosis. Both vertebral arteries patent without stenosis. 3. 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 4. No intracranial large vessel occlusion or significant stenosis. 5. Small air-fluid levels in the maxillary sinus bilaterally. 6. Aortic atherosclerosis. Aortic Atherosclerosis (ICD10-I70.0). Electronically Signed   By: Marlan Palau M.D.   On: 01/28/2022 18:52   DG Chest Port 1 View  Result Date: 01/28/2022 CLINICAL DATA:  Cough EXAM: PORTABLE CHEST 1 VIEW COMPARISON:  01/01/2022 FINDINGS: Aortic is cardiac silhouette appears prominent. No pneumothorax or pleural effusion identified. Normal pulmonary vasculature. The ascending thoracic aorta is ectatic and tortuous. There is aortic calcification. IMPRESSION: Prominent cardiac silhouette. Lungs are clear. Tortuous ectatic thoracic aorta. Electronically Signed   By: Layla Maw M.D.   On: 01/28/2022 18:38    Assessment/Plan Present on Admission:  GI bleeding/symptomatic anemia/megaloblastic anemia -Admit to MedSurg -N.p.o., IV fluid hydration -Transfuse if hemoglobin less than 7 -Patient was started on Protonix IV 80 mg for the next  72 hours. -Serial H&H -GI consult  in a.m.   Protein-calorie malnutrition, severe -N.p.o. for now.  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 team  HTN -Home meds resumed, as needed blood pressure medications  Seizures -EEG positive.  Patient started on divalproex 125 mg, 4 tablets every 8 hours.  Resume.  Depakote level ordered  Severe stenosis innominate artery   Wanda Valdez 01/28/2022, 11:54 PM

## 2022-01-28 NOTE — ED Provider Notes (Signed)
[facility] EMERGENCY DEPARTMENT Provider Note   CSN: [id number] Arrival date & time: 01/28/22  1701     History  No chief complaint on file.   Wanda Valdez is a 76 y.o. female history of hypertension, recent stroke and seizure, here presenting with multiple complaints.  Patient just was admitted to the hospital and had UTI and also an acute stroke.  She has been discharged from the hospital for about 2 weeks.  Over the last 2 weeks, patient has been having blurry vision and also some trouble speaking and shortness of breath.  Patient also has some visual hallucination as well.  Patient is compliant with her medicines and did not have any new seizures.  Patient went to PCP for follow-up today and was sent here because she is not feeling better.  The history is provided by the patient and a relative.       Home Medications Prior to Admission medications   Medication Sig Start Date End Date Taking? Authorizing Provider  alendronate (FOSAMAX) 35 MG tablet Take 35 mg by mouth once a week. 03/28/20   [provider]  allopurinol (ZYLOPRIM) 100 MG tablet Take 100 mg by mouth daily. 08/10/20   [provider]  aspirin EC 81 MG tablet Take 1 tablet (81 mg total) by mouth daily. Swallow whole. 01/10/22   Joseph Art, DO  atorvastatin (LIPITOR) 20 MG tablet Take 20 mg by mouth daily. 10/01/20   [provider]  cholestyramine (QUESTRAN) 4 g packet Take 1 packet by mouth 2 (two) times daily. 10/10/20   [provider]  clopidogrel (PLAVIX) 75 MG tablet Take 1 tablet (75 mg total) by mouth daily. 01/10/22   Marlin Canary U, DO  CREON 6000-19000 units CPEP Take 2 capsules by mouth 3 (three) times daily. 05/11/20   [provider]  diphenoxylate-atropine (LOMOTIL) 2.5-0.025 MG tablet Take 1 tablet by mouth 2 (two) times daily as needed. 12/16/20   [provider]  divalproex (DEPAKOTE SPRINKLE) 125 MG capsule Take 4 capsules (500  mg total) by mouth every 8 (eight) hours. 01/09/22   Joseph Art, DO  EPINEPHrine 0.3 mg/0.3 mL IJ SOAJ injection SMARTSIG:0.3 Milliliter(s) IM Once PRN 10/01/20   [provider]  Lacosamide 100 MG TABS Take 1 tablet (100 mg total) by mouth 2 (two) times daily. 01/09/22   Joseph Art, DO  metoprolol tartrate (LOPRESSOR) 25 MG tablet Take 25 mg by mouth daily. 01/01/21   [provider]  Multiple Vitamin (MULTIVITAMIN WITH MINERALS) TABS tablet Take 1 tablet by mouth daily. 09/14/20   Pahwani, Kasandra Knudsen, MD  pantoprazole (PROTONIX) 40 MG tablet Take 1 tablet (40 mg total) by mouth daily. 09/14/20   Pahwani, Kasandra Knudsen, MD  sucralfate (CARAFATE) 1 g tablet Take 1 g by mouth 3 (three) times daily. Dissolve 1 tablet in 2 ounces of water 05/27/20   [provider]      Allergies    Patient has no known allergies.    Review of Systems   Review of Systems  Psychiatric/Behavioral:  Positive for hallucinations.   All other systems reviewed and are negative.   Physical Exam Updated Vital Signs BP (!) 141/66   Pulse 62   Temp (!) 97.3 F (36.3 C) (Oral)   Resp 18   Ht 4\' 10"  (1.473 m)   Wt 41.6 kg   SpO2 100%   BMI 19.17 kg/m  Physical Exam Vitals and nursing note reviewed.  Constitutional:  Comments: Chronically ill-appearing and pale  HENT:     Head: Normocephalic.     Nose: Nose normal.     Mouth/Throat:     Mouth: Mucous membranes are dry.  Eyes:     Extraocular Movements: Extraocular movements intact.     Pupils: Pupils are equal, round, and reactive to light.  Cardiovascular:     Rate and Rhythm: Normal rate and regular rhythm.     Pulses: Normal pulses.     Heart sounds: Normal heart sounds.  Pulmonary:     Effort: Pulmonary effort is normal.     Breath sounds: Normal breath sounds.  Abdominal:     Palpations: Abdomen is soft.  Genitourinary:    Comments: Rectal- melena  Musculoskeletal:        General: Normal range of motion.      Cervical back: Normal range of motion and neck supple.  Skin:    General: Skin is warm.     Capillary Refill: Capillary refill takes less than 2 seconds.  Neurological:     Comments: Patient's strength is 4 out of 5 of the left side which is unchanged since previous.  Patient has no obvious facial droop.  No obvious slurred speech.  Psychiatric:        Mood and Affect: Mood normal.        Behavior: Behavior normal.     ED Results / Procedures / Treatments   Labs (all labs ordered are listed, but only abnormal results are displayed) Labs Reviewed  CBC - Abnormal; Notable for the following components:      Result Value   RBC 2.14 (*)    Hemoglobin 7.0 (*)    HCT 22.2 (*)    MCV 103.7 (*)    All other components within normal limits  DIFFERENTIAL - Abnormal; Notable for the following components:   Abs Immature Granulocytes 0.09 (*)    All other components within normal limits  COMPREHENSIVE METABOLIC PANEL - Abnormal; Notable for the following components:   Glucose, Bld 113 (*)    BUN 26 (*)    Creatinine, Ser 1.18 (*)    Albumin 2.4 (*)    GFR, Estimated 47 (*)    All other components within normal limits  RESP PANEL BY RT-PCR (FLU A&B, COVID) ARPGX2  ETHANOL  PROTIME-INR  APTT  URINALYSIS, ROUTINE W REFLEX MICROSCOPIC  RAPID URINE DRUG SCREEN, HOSP PERFORMED  OCCULT BLOOD X 1 CARD TO LAB, STOOL  CBG MONITORING, ED  TROPONIN I (HIGH SENSITIVITY)    EKG EKG Interpretation  Date/Time:  Wednesday January 28 2022 17:14:59 EST Ventricular Rate:  69 PR Interval:  177 QRS Duration: 102 QT Interval:  453 QTC Calculation: 486 R Axis:   -12 Text Interpretation: Sinus rhythm Abnormal R-wave progression, early transition Abnormal T, consider ischemia, diffuse leads TWI new since previous Confirmed by Wandra Arthurs [phone]) on 01/28/2022 5:22:54 PM  Radiology No results found.  Procedures Procedures    CRITICAL CARE Performed by: Wandra Arthurs   Total critical care  time: 30 minutes  Critical care time was exclusive of separately billable procedures and treating other patients.  Critical care was necessary to treat or prevent imminent or life-threatening deterioration.  Critical care was time spent personally by me on the following activities: development of treatment plan with patient and/or surrogate as well as nursing, discussions with consultants, evaluation of patient's response to treatment, examination of patient, obtaining history from patient or surrogate, ordering and performing treatments  and interventions, ordering and review of laboratory studies, ordering and review of radiographic studies, pulse oximetry and re-evaluation of patient's condition.   Medications Ordered in ED Medications  iohexol (OMNIPAQUE) 350 MG/ML injection 75 mL (75 mLs Intravenous Contrast Given 01/28/22 1758)    ED Course/ Medical Decision Making/ A&P                           Medical Decision Making Otisha Davignon is a 76 y.o. female here presenting with weakness.  Patient has left-sided weakness since her recent stroke 4 weeks ago.  Patient was discharged 2 weeks ago from the hospital.  Patient is started on Plavix and also on Depakote for seizure.  Concern for hemorrhagic conversion versus another stroke versus electrolyte abnormalities versus symptomatic anemia.  Plan to get CBC and CMP and CTA head and neck and chest x-ray and COVID test.  7:57 PM Patient's hemoglobin dropped to 7.  Patient's guaiac is positive with black stools.  Concern for possible slow GI bleed from Plavix.  Patient follows up with [facility] medical for GI.  At this point I ordered Protonix bolus and drip.  Patient will likely need admission for symptomatic anemia from upper GI bleed from Plavix.  Patient will likely need blood transfusion when patient gets admitted.  I have messaged Dr. Paulita Fujita from GI to see patient   Problems Addressed: Anemia, unspecified type: acute illness or  injury  Amount and/or Complexity of Data Reviewed Labs: ordered. Decision-making details documented in ED Course. Radiology: ordered and independent interpretation performed. Decision-making details documented in ED Course.  Risk Prescription drug management.   Final Clinical Impression(s) / ED Diagnoses Final diagnoses:  None    Rx / DC Orders ED Discharge Orders     None         Drenda Freeze, MD 01/28/22 2025

## 2022-01-28 NOTE — ED Triage Notes (Signed)
Per daughters, pt had a stroke 4 weeks ago and since being discharged 2 weeks ago has been having trouble walking, trouble with memory, incontinence of bowels and bladder  Pt states runny nose and trouble walking and cough  Stroke had some left sided deficits   Pt A&O x4, NAD noted at this time

## 2022-01-28 NOTE — ED Notes (Signed)
Patient transported to CT 

## 2022-01-28 NOTE — Progress Notes (Signed)
Plan of Care Note for accepted transfer   Patient: Wanda Valdez MRN: [id number]   DOA: 01/28/2022  Facility requesting transfer: [facility] ED  Requesting Provider: Charlynne Pander, MD  Reason for transfer: Acute blood loss anemia due to GI bleeding Facility course: Wanda Valdez is a 76 y.o. female history of hypertension, recent stroke and seizure, here presenting with multiple complaints.  Patient just was admitted to the hospital and had UTI and also an acute stroke.  She has been discharged from the hospital for about 2 weeks.  Over the last 2 weeks, patient has been having blurry vision and also some trouble speaking and shortness of breath.  Patient also has some visual hallucination as well.  Patient is compliant with her medicines and did not have any new seizures.  Patient went to PCP for follow-up today and was sent here because she is not feeling better.  Upon presentation to the ER vital signs were within normal.  Labs revealed unremarkable BMP.  CBC showed hemoglobin of 7 down from 8.4 and hematocrit 22.2 down from 24.7 on 01/09/2022.  Alcohol level was less than 10.  Stool Hemoccult came back positive  EKG showed normal sinus rhythm with a rate of 69 with T wave inversion in anterolateral and inferior leads.  Portable chest x-ray showed prominent cardiac silhouette with tortuous ectatic thoracic aorta with no acute cardiopulmonary disease.  CTA of the head and neck revealed: 1. Evolving infarct right posterior parietal lobe. No acute intracranial abnormality. 2. Atherosclerotic disease in the aortic arch and carotid bifurcation bilaterally. 25% diameter stenosis proximal right internal carotid artery unchanged. No significant left carotid stenosis. Both vertebral arteries patent without stenosis. 3. 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 4. No intracranial large vessel occlusion or significant stenosis. 5. Small air-fluid levels in the  maxillary sinus bilaterally. 6. Aortic atherosclerosis.  The patient was given IV Protonix bolus and drip and 1 L bolus of IV normal saline.  Plan of care: The patient is accepted for admission to Telemetry unit, at [facility]..   She will be under the care and responsibility of the ER physician till she arrives to [facility].  Author: Hannah Beat, MD 01/28/2022  Check [email or web address] for on-call coverage.  Nursing staff, Please call [facility] Admits & Consults System-Wide number on Amion as soon as patient's arrival, so appropriate admitting provider can evaluate the pt.

## 2022-01-29 DIAGNOSIS — D62 Acute posthemorrhagic anemia: Secondary | ICD-10-CM

## 2022-01-29 DIAGNOSIS — R569 Unspecified convulsions: Secondary | ICD-10-CM

## 2022-01-29 DIAGNOSIS — K921 Melena: Secondary | ICD-10-CM

## 2022-01-29 DIAGNOSIS — K922 Gastrointestinal hemorrhage, unspecified: Secondary | ICD-10-CM | POA: Diagnosis not present

## 2022-01-29 DIAGNOSIS — E43 Unspecified severe protein-calorie malnutrition: Secondary | ICD-10-CM | POA: Diagnosis not present

## 2022-01-29 DIAGNOSIS — I63411 Cerebral infarction due to embolism of right middle cerebral artery: Secondary | ICD-10-CM

## 2022-01-29 LAB — BASIC METABOLIC PANEL
Anion gap: 6 (ref 5–15)
BUN: 21 mg/dL (ref 8–23)
CO2: 21 mmol/L — ABNORMAL LOW (ref 22–32)
Calcium: 8.1 mg/dL — ABNORMAL LOW (ref 8.9–10.3)
Chloride: 112 mmol/L — ABNORMAL HIGH (ref 98–111)
Creatinine, Ser: 0.96 mg/dL (ref 0.44–1.00)
GFR, Estimated: >60 mL/min (ref >60)
Glucose, Bld: 82 mg/dL (ref 70–99)
Potassium: 3.6 mmol/L (ref 3.5–5.1)
Sodium: 139 mmol/L (ref 135–145)

## 2022-01-29 LAB — PREPARE RBC (CROSSMATCH)

## 2022-01-29 LAB — HEMOGLOBIN AND HEMATOCRIT, BLOOD
HCT: 19.4 % — ABNORMAL LOW (ref 36.0–46.0)
HCT: 28.4 % — ABNORMAL LOW (ref 36.0–46.0)
Hemoglobin: 5.9 g/dL — CL (ref 12.0–15.0)
Hemoglobin: 9.4 g/dL — ABNORMAL LOW (ref 12.0–15.0)

## 2022-01-29 LAB — MAGNESIUM: Magnesium: 1.8 mg/dL (ref 1.7–2.4)

## 2022-01-29 LAB — ABO/RH: ABO/RH(D): O POS

## 2022-01-29 LAB — VALPROIC ACID LEVEL: Valproic Acid Lvl: 34 ug/mL — ABNORMAL LOW (ref 50.0–100.0)

## 2022-01-29 MED ORDER — ACETAMINOPHEN 325 MG PO TABS
650.0000 mg | ORAL_TABLET | Freq: Four times a day (QID) | ORAL | Status: DC | PRN
  Administered 2022-02-02: 650 mg via ORAL
  Filled 2022-01-29: qty 2

## 2022-01-29 MED ORDER — ACETAMINOPHEN 650 MG RE SUPP: 650.0000 mg | Freq: Four times a day (QID) | RECTAL | Status: DC | PRN

## 2022-01-29 MED ORDER — SODIUM CHLORIDE 0.9 % IV SOLN: INTRAVENOUS | Status: DC

## 2022-01-29 MED ORDER — SODIUM CHLORIDE 0.9% IV SOLUTION: Freq: Once | INTRAVENOUS | Status: AC

## 2022-01-29 MED ORDER — PANCRELIPASE (LIP-PROT-AMYL) 12000-38000 UNITS PO CPEP: 12000.0000 [IU] | ORAL_CAPSULE | Freq: Three times a day (TID) | ORAL | Status: DC

## 2022-01-29 MED ORDER — SUCRALFATE 1 GM/10ML PO SUSP
1.0000 g | Freq: Three times a day (TID) | ORAL | Status: AC
  Administered 2022-01-29 – 2022-02-05 (×22): 1 g via ORAL
  Filled 2022-01-29 (×22): qty 10

## 2022-01-29 MED ORDER — PANTOPRAZOLE SODIUM 40 MG IV SOLR: 40.0000 mg | Freq: Two times a day (BID) | INTRAVENOUS | Status: DC

## 2022-01-29 MED ORDER — SODIUM CHLORIDE 0.9 % IV SOLN
100.0000 mg | Freq: Two times a day (BID) | INTRAVENOUS | Status: DC
  Administered 2022-01-29 – 2022-01-31 (×5): 100 mg via INTRAVENOUS
  Filled 2022-01-29 (×7): qty 10

## 2022-01-29 MED ORDER — METOPROLOL TARTRATE 25 MG PO TABS: 25.0000 mg | ORAL_TABLET | Freq: Every day | ORAL | Status: DC

## 2022-01-29 MED ORDER — VALPROATE SODIUM 100 MG/ML IV SOLN
500.0000 mg | Freq: Three times a day (TID) | INTRAVENOUS | Status: DC
  Administered 2022-01-29 (×2): 500 mg via INTRAVENOUS
  Filled 2022-01-29 (×3): qty 5

## 2022-01-29 MED ORDER — DIVALPROEX SODIUM 125 MG PO CSDR
500.0000 mg | DELAYED_RELEASE_CAPSULE | Freq: Three times a day (TID) | ORAL | Status: DC
  Filled 2022-01-29: qty 4

## 2022-01-29 MED ORDER — HYDRALAZINE HCL 20 MG/ML IJ SOLN
5.0000 mg | INTRAMUSCULAR | Status: DC | PRN
  Administered 2022-02-01: 5 mg via INTRAVENOUS
  Filled 2022-01-29: qty 1

## 2022-01-29 MED ORDER — VALPROATE SODIUM 100 MG/ML IV SOLN
500.0000 mg | Freq: Three times a day (TID) | INTRAVENOUS | Status: DC
  Administered 2022-01-30 – 2022-01-31 (×4): 500 mg via INTRAVENOUS
  Filled 2022-01-29 (×6): qty 5

## 2022-01-29 MED ORDER — SENNOSIDES-DOCUSATE SODIUM 8.6-50 MG PO TABS: 1.0000 | ORAL_TABLET | Freq: Every evening | ORAL | Status: DC | PRN

## 2022-01-29 NOTE — Evaluation (Signed)
Clinical/Bedside Swallow Evaluation Patient Details  Name: Wanda Valdez MRN: [id number] Date of Birth: 1945/07/30  Today's Date: 01/29/2022 Time: SLP Start Time (ACUTE ONLY): 1231 SLP Stop Time (ACUTE ONLY): 1245 SLP Time Calculation (min) (ACUTE ONLY): 14 min  Past Medical History:  Past Medical History:  Diagnosis Date   Hypertension    Stroke [facility])    Past Surgical History:  Past Surgical History:  Procedure Laterality Date   ABDOMINAL HYSTERECTOMY     BIOPSY  09/11/2020   Procedure: BIOPSY;  Surgeon: Kathi Der, MD;  Location: [facility] ENDOSCOPY;  Service: Gastroenterology;;   BIOPSY  09/13/2020   Procedure: BIOPSY;  Surgeon: Kathi Der, MD;  Location: [facility] ENDOSCOPY;  Service: Gastroenterology;;   BIOPSY  01/29/2021   Procedure: BIOPSY;  Surgeon: Vida Rigger, MD;  Location: [facility] ENDOSCOPY;  Service: Endoscopy;;   COLONOSCOPY N/A 09/13/2020   Procedure: COLONOSCOPY;  Surgeon: Kathi Der, MD;  Location: [facility] ENDOSCOPY;  Service: Gastroenterology;  Laterality: N/A;   ESOPHAGOGASTRODUODENOSCOPY N/A 09/11/2020   Procedure: ESOPHAGOGASTRODUODENOSCOPY (EGD);  Surgeon: Kathi Der, MD;  Location: [facility] ENDOSCOPY;  Service: Gastroenterology;  Laterality: N/A;   ESOPHAGOGASTRODUODENOSCOPY (EGD) WITH PROPOFOL N/A 01/29/2021   Procedure: ESOPHAGOGASTRODUODENOSCOPY (EGD) WITH PROPOFOL;  Surgeon: Vida Rigger, MD;  Location: [facility] ENDOSCOPY;  Service: Endoscopy;  Laterality: N/A;  With dilation as well and will need ultraslim endoscope in the room   IR ANGIO INTRA EXTRACRAN SEL COM CAROTID INNOMINATE BILAT MOD SED  01/07/2022   POLYPECTOMY  09/13/2020   Procedure: POLYPECTOMY;  Surgeon: Kathi Der, MD;  Location: [facility] ENDOSCOPY;  Service: Gastroenterology;;   Gaspar Bidding DILATION N/A 01/29/2021   Procedure: Gaspar Bidding DILATION;  Surgeon: Vida Rigger, MD;  Location: [facility] ENDOSCOPY;  Service: Endoscopy;  Laterality: N/A;   HPI:  Patient is a 76 y.o. female with PMH: lymphocytic colitis,  HTN, recent right shoulder surgery(one month ago), esophageal stricture s/p EGD with dilation. 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 SLE completed on 01/04/2022 and 01/06/2022.  HH SLP services recommended but no acute level SLP recommended.Pt dc'd and then returned to hospital on 11/29 due to concern for GI bleed - she is on Eliquis.  Swallow eval ordered.  GI referral pending.  Pt is receiving blood transfusion at this time.  Prior esophagram showed narrowing - cervical and distally 12/2020 and pt is s/p endoscopy- empirically dilated to 15 mm proximal.  CXR showed tortulous thoracic aorta.  Medication list includes Carafate, Protonix and depakote sprinkles.  CT head showed evolving right posterior parietal CVA.  Pt endorses cough recently - productive to secretions and she eats soft foods at home.    Assessment / Plan / Recommendation  Clinical Impression  Minimal po provided due to pt's GI bleed concerns and low hemoglobin.  Pt with congested productive cough with expectoration of thick secretions - after oral care with toothpaste.  Cough immediately post swlalow with ice chips boluses - none with tsps of clears.  Clinically delayed swallow noted.  Pt also with very poor dentition.  Expectoration of white secretion initially and then yellow noted. GI NP arrived to room and reports tentatively plan is for endoscopy 12/1.  Today recommend clear liquids via tsp and will plan for MBS 12/2 if GI and hospitalist agree as pt was scheduled for OP MBS in the past but it was not conducted.  Thanks. RN and NT informed SLP Visit Diagnosis: Dysphagia, oral phase (R13.11)    Aspiration Risk  Moderate aspiration risk    Diet Recommendation Thin liquid (tsps  clears)   Supervision: Full supervision/cueing for compensatory strategies Compensations: Slow rate;Small sips/bites Postural Changes: Seated upright at 90 degrees;Remain upright for at least 30 minutes after po intake    Other   Recommendations Oral Care Recommendations: Oral care BID    Recommendations for follow up therapy are one component of a multi-disciplinary discharge planning process, led by the attending physician.  Recommendations may be updated based on patient status, additional functional criteria and insurance authorization.  Follow up Recommendations Home health SLP      Assistance Recommended at Discharge    Functional Status Assessment Patient has had a recent decline in their functional status and demonstrates the ability to make significant improvements in function in a reasonable and predictable amount of time.  Frequency and Duration min 1 x/week  1 week       Prognosis Prognosis for Safe Diet Advancement: Good      Swallow Study   General HPI: Patient is a 76 y.o. female with PMH: lymphocytic colitis, HTN, recent right shoulder surgery(one month ago), esophageal stricture s/p EGD with dilation. 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 SLE completed on 01/04/2022 and 01/06/2022.  HH SLP services recommended but no acute level SLP recommended.Pt dc'd and then returned to hospital on 11/29 due to concern for GI bleed - she is on Eliquis.  Swallow eval ordered.  GI referral pending.  Pt is receiving blood transfusion at this time.  Prior esophagram showed narrowing - cervical and distally 12/2020 and pt is s/p endoscopy- empirically dilated to 15 mm proximal.  CXR showed tortulous thoracic aorta.  Medication list includes Carafate, Protonix and depakote sprinkles.  CT head showed evolving right posterior parietal CVA.  Pt endorses cough recently - productive to secretions and she eats soft foods at home. Type of Study: Bedside Swallow Evaluation Previous Swallow Assessment: see HPI Diet Prior to this Study: NPO Temperature Spikes Noted: No Respiratory Status: Room air History of Recent Intubation: No Behavior/Cognition: Alert;Cooperative Oral Care Completed by SLP: Yes Oral Cavity  - Dentition: Other (Comment) (no upper teeth, few lower teeth- anterior only) Vision: Functional for self-feeding Self-Feeding Abilities: Needs assist Patient Positioning: Upright in bed Baseline Vocal Quality: Normal Volitional Cough: Strong Volitional Swallow: Able to elicit    Oral/Motor/Sensory Function Overall Oral Motor/Sensory Function: Within functional limits   Ice Chips Ice chips: Impaired Presentation: Self Fed Oral Phase Impairments: Poor awareness of bolus Oral Phase Functional Implications: Prolonged oral transit Pharyngeal Phase Impairments: Cough - Immediate   Thin Liquid Thin Liquid: Impaired Presentation: Spoon Pharyngeal  Phase Impairments: Suspected delayed Swallow    Nectar Thick Nectar Thick Liquid: Not tested   Honey Thick Honey Thick Liquid: Not tested   Puree Puree: Not tested   Solid     Solid: Not tested      Chales Abrahams 01/29/2022,1:00 PM   Rolena Infante, MS [facility] SLP Acute Rehab Services Office [phone] Pager [phone]

## 2022-01-29 NOTE — Consult Note (Signed)
[facility] Gastroenterology Consult  Referring Provider: No ref. provider found Primary Care Physician:  Raquel James, MD Primary Gastroenterologist: Dr. Ewing Schlein [facility] GI)  Reason for Consultation: melena, anemia  SUBJECTIVE:   HPI: Wanda Valdez is a 76 y.o. female with past medical history significant for CVA 4 weeks prior with residual left-sided deficits, seizure, hypertension.  Presented to [facility] on 01/28/2022 from PCP office feeling generally unwell.  Labs on presentation showed hemoglobin 7.0 (now 5.5 status post 1 unit PRBC, was 8.4 on 01/09/2022), WBC 8.0, platelet 245, INR 1.1. CTA head showed evolving right posterior parietal lobe infarct. CXR completed showed prominent cardiac silhouette.   Patient noted that she has some confusion with recent events.  She has a legal guardian, her daughter.  She denied any abdominal pain.  Noted that she has been experiencing dark stools for the last 2 weeks and has been having some loose stools for the past few days.  No chest pain or shortness of breath.  No dysphagia.  EGD last completed by Dr. Ewing Schlein on 01/29/2021 for dysphagia and abnormal upper GI series with findings of small hiatal hernia, benign-appearing intrinsic stenosis status post dilation with savory 15 mm (mild mucosal disruption), patchy diffuse gastric inflammation, few nonbleeding superficial duodenal ulcers, small AVM x 2 and D2/D3.  Last colonoscopy completed by Dr. Levora Angel on 09/13/2020 for abnormal CT of the GI tract with findings of 2 mm ascending colon polyp status post cold forcep polypectomy, 3 mm transverse colon polyp status post cold forcep polypectomy, 2 mm sigmoid colon polyp status post cold forcep polypectomy, congestion in the transverse colon and ascending colon status post biopsy, medium sized internal hemorrhoids.  Past Medical History:  Diagnosis Date   Hypertension    Stroke [facility])    Past Surgical History:  Procedure Laterality Date    ABDOMINAL HYSTERECTOMY     BIOPSY  09/11/2020   Procedure: BIOPSY;  Surgeon: Kathi Der, MD;  Location: [facility] ENDOSCOPY;  Service: Gastroenterology;;   BIOPSY  09/13/2020   Procedure: BIOPSY;  Surgeon: Kathi Der, MD;  Location: [facility] ENDOSCOPY;  Service: Gastroenterology;;   BIOPSY  01/29/2021   Procedure: BIOPSY;  Surgeon: Vida Rigger, MD;  Location: [facility] ENDOSCOPY;  Service: Endoscopy;;   COLONOSCOPY N/A 09/13/2020   Procedure: COLONOSCOPY;  Surgeon: Kathi Der, MD;  Location: [facility] ENDOSCOPY;  Service: Gastroenterology;  Laterality: N/A;   ESOPHAGOGASTRODUODENOSCOPY N/A 09/11/2020   Procedure: ESOPHAGOGASTRODUODENOSCOPY (EGD);  Surgeon: Kathi Der, MD;  Location: [facility] ENDOSCOPY;  Service: Gastroenterology;  Laterality: N/A;   ESOPHAGOGASTRODUODENOSCOPY (EGD) WITH PROPOFOL N/A 01/29/2021   Procedure: ESOPHAGOGASTRODUODENOSCOPY (EGD) WITH PROPOFOL;  Surgeon: Vida Rigger, MD;  Location: [facility] ENDOSCOPY;  Service: Endoscopy;  Laterality: N/A;  With dilation as well and will need ultraslim endoscope in the room   IR ANGIO INTRA EXTRACRAN SEL COM CAROTID INNOMINATE BILAT MOD SED  01/07/2022   POLYPECTOMY  09/13/2020   Procedure: POLYPECTOMY;  Surgeon: Kathi Der, MD;  Location: [facility] ENDOSCOPY;  Service: Gastroenterology;;   Gaspar Bidding DILATION N/A 01/29/2021   Procedure: Gaspar Bidding DILATION;  Surgeon: Vida Rigger, MD;  Location: [facility] ENDOSCOPY;  Service: Endoscopy;  Laterality: N/A;   Prior to Admission medications   Medication Sig Start Date End Date Taking? Authorizing Provider  alendronate (FOSAMAX) 35 MG tablet Take 35 mg by mouth once a week. 03/28/20  Yes [provider]  CREON 6000-19000 units CPEP Take 1 capsule by mouth 3 (three) times daily. 05/11/20  Yes [provider]  allopurinol (ZYLOPRIM) 100 MG tablet Take  100 mg by mouth daily. Patient not taking: Reported on 01/29/2022 08/10/20   [provider]  atorvastatin (LIPITOR) 20 MG tablet Take 20 mg by  mouth daily. 10/01/20   [provider]  cholestyramine (QUESTRAN) 4 g packet Take 1 packet by mouth 2 (two) times daily. 10/10/20   [provider]  clopidogrel (PLAVIX) 75 MG tablet Take 1 tablet (75 mg total) by mouth daily. 01/10/22   Joseph Art, DO  diphenoxylate-atropine (LOMOTIL) 2.5-0.025 MG tablet Take 1 tablet by mouth 2 (two) times daily as needed. 12/16/20   [provider]  divalproex (DEPAKOTE SPRINKLE) 125 MG capsule Take 4 capsules (500 mg total) by mouth every 8 (eight) hours. 01/09/22   Joseph Art, DO  EPINEPHrine 0.3 mg/0.3 mL IJ SOAJ injection SMARTSIG:0.3 Milliliter(s) IM Once PRN 10/01/20   [provider]  Lacosamide 100 MG TABS Take 1 tablet (100 mg total) by mouth 2 (two) times daily. 01/09/22   Joseph Art, DO  metoprolol tartrate (LOPRESSOR) 25 MG tablet Take 25 mg by mouth daily. 01/01/21   [provider]  Multiple Vitamin (MULTIVITAMIN WITH MINERALS) TABS tablet Take 1 tablet by mouth daily. 09/14/20   Pahwani, Kasandra Knudsen, MD  pantoprazole (PROTONIX) 40 MG tablet Take 1 tablet (40 mg total) by mouth daily. 09/14/20   Pahwani, Kasandra Knudsen, MD  sucralfate (CARAFATE) 1 g tablet Take 1 g by mouth 3 (three) times daily. Dissolve 1 tablet in 2 ounces of water 05/27/20   [provider]   Current Facility-Administered Medications  Medication Dose Route Frequency Provider Last Rate Last Admin   0.9 %  sodium chloride infusion   Intravenous Continuous Joneen Roach, Debby, MD 75 mL/hr at 01/29/22 0230 New Bag at 01/29/22 0230   acetaminophen (TYLENOL) tablet 650 mg  650 mg Oral Q6H PRN Gery Pray, MD       Or   acetaminophen (TYLENOL) suppository 650 mg  650 mg Rectal Q6H PRN Crosley, Debby, MD       hydrALAZINE (APRESOLINE) injection 5 mg  5 mg Intravenous Q4H PRN Crosley, Debby, MD       influenza vaccine adjuvanted (FLUAD) injection 0.5 mL  0.5 mL Intramuscular Tomorrow-1000 Mansy, Jan A, MD       lacosamide (VIMPAT) 100 mg  in sodium chloride 0.9 % 25 mL IVPB  100 mg Intravenous BID Noralee Stain, DO 70 mL/hr at 01/29/22 1315 100 mg at 01/29/22 1315   pantoprozole (PROTONIX) 80 mg /NS 100 mL infusion  8 mg/hr Intravenous Continuous Charlynne Pander, MD 10 mL/hr at 01/29/22 0720 8 mg/hr at 01/29/22 0720   pneumococcal 20-valent conjugate vaccine (PREVNAR 20) injection 0.5 mL  0.5 mL Intramuscular Tomorrow-1000 Mansy, Jan A, MD       sucralfate (CARAFATE) 1 GM/10ML suspension 1 g  1 g Oral TID WC & HS Liliane Shi H, DO       valproate (DEPACON) 500 mg in dextrose 5 % 50 mL IVPB  500 mg Intravenous Q8H Noralee Stain, DO 55 mL/hr at 01/29/22 1348 500 mg at 01/29/22 1348   Allergies as of 01/28/2022   (No Known Allergies)   Family History  Problem Relation Age of Onset   Cirrhosis Mother    Heart attack Father    Social History   Socioeconomic History   Marital status: Divorced    Spouse name: Not on file   Number of children: Not on file   Years of education: Not on file  Highest education level: Not on file  Occupational History   Not on file  Tobacco Use   Smoking status: Some Days    Types: Cigarettes   Smokeless tobacco: Never  Vaping Use   Vaping Use: Never used  Substance and Sexual Activity   Alcohol use: Yes    Comment: occassional   Drug use: Never   Sexual activity: Not on file  Other Topics Concern   Not on file  Social History Narrative   Not on file   Social Determinants of Health   Financial Resource Strain: Not on file  Food Insecurity: No Food Insecurity (01/28/2022)   Hunger Vital Sign    Worried About Running Out of Food in the Last Year: Never true    Ran Out of Food in the Last Year: Never true  Transportation Needs: No Transportation Needs (01/28/2022)   PRAPARE - Administrator, Civil Service (Medical): No    Lack of Transportation (Non-Medical): No  Physical Activity: Not on file  Stress: Not on file  Social Connections: Not on file  Intimate  Partner Violence: Not At Risk (01/28/2022)   Humiliation, Afraid, Rape, and Kick questionnaire    Fear of Current or Ex-Partner: No    Emotionally Abused: No    Physically Abused: No    Sexually Abused: No   Review of Systems:  Review of Systems  Respiratory:  Negative for shortness of breath.   Cardiovascular:  Negative for chest pain.  Gastrointestinal:  Positive for diarrhea and melena. Negative for abdominal pain.       No dysphagia.    OBJECTIVE:   Temp:  [97.3 F (36.3 C)-98 F (36.7 C)] 97.7 F (36.5 C) (11/30 1345) Pulse Rate:  [62-80] 72 (11/30 1345) Resp:  [14-23] 16 (11/30 1345) BP: (107-174)/(62-88) 174/84 (11/30 1345) SpO2:  [97 %-100 %] 100 % (11/30 1345) Weight:  [41.6 kg] 41.6 kg (11/29 1708) Last BM Date : 01/28/22 Physical Exam Constitutional:      General: She is not in acute distress.    Appearance: She is not ill-appearing, toxic-appearing or diaphoretic.  HENT:     Mouth/Throat:     Comments: Wet cough appreciated surrounding SLP evaluation. Cardiovascular:     Rate and Rhythm: Normal rate and regular rhythm.  Pulmonary:     Effort: No respiratory distress.     Breath sounds: Normal breath sounds.  Abdominal:     General: Bowel sounds are normal. There is no distension.     Palpations: Abdomen is soft.     Tenderness: There is abdominal tenderness (diffuse to palpation). There is no guarding.     Labs: Recent Labs    01/28/22 1713 01/29/22 0544  WBC 8.0  --   HGB 7.0* 5.9*  HCT 22.2* 19.4*  PLT 245  --    BMET Recent Labs    01/28/22 1713 01/29/22 0544  NA 139 139  K 3.8 3.6  CL 107 112*  CO2 23 21*  GLUCOSE 113* 82  BUN 26* 21  CREATININE 1.18* 0.96  CALCIUM 8.9 8.1*   LFT Recent Labs    01/28/22 1713  PROT 6.6  ALBUMIN 2.4*  AST 24  ALT 10  ALKPHOS 70  BILITOT 0.6   PT/INR Recent Labs    01/28/22 1713  LABPROT 14.3  INR 1.1    Diagnostic imaging: CT ANGIO HEAD NECK W WO CM  Result Date:  01/28/2022 CLINICAL DATA:  Stroke follow-up. Stroke 4 weeks ago now  with difficulty walking, memory, incontinence of bowel and bladder EXAM: CT ANGIOGRAPHY HEAD AND NECK TECHNIQUE: Multidetector CT imaging of the head and neck was performed using the standard protocol during bolus administration of intravenous contrast. Multiplanar CT image reconstructions and MIPs were obtained to evaluate the vascular anatomy. Carotid stenosis measurements (when applicable) are obtained utilizing NASCET criteria, using the distal internal carotid diameter as the denominator. RADIATION DOSE REDUCTION: This exam was performed according to the departmental dose-optimization program which includes automated exposure control, adjustment of the mA and/or kV according to patient size and/or use of iterative reconstruction technique. CONTRAST:  75mL OMNIPAQUE IOHEXOL 350 MG/ML SOLN COMPARISON:  MRI head 01/04/2022.  CT angio head neck 01/01/2022 FINDINGS: CT HEAD FINDINGS Brain: Hypodensity in the right posterior parietal lobe. This is site of restricted diffusion on 01/04/2022 and is compatible with resolving infarct. No new infarct. No hemorrhage or mass. Ventricle size normal. Vascular: Negative for hyperdense vessel Skull: Negative Sinuses/Orbits: Small air-fluid levels in the maxillary sinus bilaterally. Remaining sinuses clear. Bilateral cataract extraction Other: None Review of the MIP images confirms the above findings CTA NECK FINDINGS Aortic arch: Atherosclerotic calcification in the aortic arch. 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 Right carotid system: Atherosclerotic calcification right carotid bifurcation and proximal right internal carotid artery. Proximally 25% diameter stenosis proximal right internal carotid artery unchanged from the prior study. Left carotid system: Atherosclerotic calcification left carotid bifurcation and proximal left internal carotid artery. No significant stenosis.  Vertebral arteries: Left vertebral artery dominant. Both vertebral arteries patent to the skull base without stenosis. 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 Cervical spondylosis. No acute skeletal abnormality. Other neck: Negative for mass or adenopathy Upper chest: Lung apices clear bilaterally. Review of the MIP images confirms the above findings CTA HEAD FINDINGS Anterior circulation: Mild atherosclerotic disease in the cavernous carotid bilaterally. No significant stenosis. Anterior and middle cerebral arteries patent bilaterally without stenosis or large vessel occlusion. Posterior circulation: Both vertebral arteries patent to the basilar. PICA patent bilaterally. Basilar patent. Fetal origin posterior cerebral artery bilaterally. No stenosis or large vessel occlusion. Venous sinuses: Normal venous enhancement Anatomic variants: None Review of the MIP images confirms the above findings IMPRESSION: 1. Evolving infarct right posterior parietal lobe. No acute intracranial abnormality. 2. Atherosclerotic disease in the aortic arch and carotid bifurcation bilaterally. 25% diameter stenosis proximal right internal carotid artery unchanged. No significant left carotid stenosis. Both vertebral arteries patent without stenosis. 3. 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 4. No intracranial large vessel occlusion or significant stenosis. 5. Small air-fluid levels in the maxillary sinus bilaterally. 6. Aortic atherosclerosis. Aortic Atherosclerosis (ICD10-I70.0). Electronically Signed   By: Marlan Palau M.D.   On: 01/28/2022 18:52   DG Chest Port 1 View  Result Date: 01/28/2022 CLINICAL DATA:  Cough EXAM: PORTABLE CHEST 1 VIEW COMPARISON:  01/01/2022 FINDINGS: Aortic is cardiac silhouette appears prominent. No pneumothorax or pleural effusion identified. Normal pulmonary vasculature. The ascending thoracic aorta is ectatic and tortuous. There is aortic calcification.  IMPRESSION: Prominent cardiac silhouette. Lungs are clear. Tortuous ectatic thoracic aorta. Electronically Signed   By: Layla Maw M.D.   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 Recent CVA, on dual antiplatelet therapy prior to admission, on hold Hypertension Personal history colon polyps  PLAN: -Recommend EGD to further evaluate melena and anemia, suspect related to known duodenal ulcers versus AVM -Ideally hold  clopidogrel x 5 days prior to endoscopy, though given anemia and active bleeding would recommend EGD earlier -Called and discussed above recommendations with patient's guardian, her daughter, discussed benefits, alternatives and risks of EGD procedure (including bleeding, infection, perforation, complications with anesthesia), patient's daughter provided verbal consent to proceed with procedure -Continue IV PPI every 12 hours -Continue Carafate solution p.o. 4 times daily -N.p.o. at midnight for EGD on 01/30/2022   LOS: 1 day   Liliane Shilaire Suly Vukelich, DO [facility]

## 2022-01-29 NOTE — TOC Initial Note (Addendum)
Transition of Care [facility]) - Initial/Assessment Note    Patient Details  Name: Wanda Valdez MRN: [id number] Date of Birth: [birth year]  Transition of Care [facility]) CM/SW Contact:    Beckie Busing, RN Phone [phone]  01/29/2022, 3:07 PM  Clinical Narrative:                  Transition of Care [facility]) Screening Note   Patient Details  Name: Wanda Valdez Date of Birth: 07-19-1945   Transition of Care [facility]) CM/SW Contact:    Beckie Busing, RN Phone Number: 01/29/2022, 3:07 PM    Transition of Care Department [facility]) has reviewed patient and no TOC needs have been identified at this time. We will continue to monitor patient advancement through interdisciplinary progression rounds. If new patient transition needs arise, please place a TOC consult.  Patient is currently active with [facility] for home health. CM verified per Tresa Endo with [facility]. MD will need to enter home health orders at discharge in order to resume services.         Patient Goals and CMS Choice        Expected Discharge Plan and Services                                                Prior Living Arrangements/Services                       Activities of Daily Living Home Assistive Devices/Equipment: Dan Humphreys (specify type) ADL Screening (condition at time of admission) Patient's cognitive ability adequate to safely complete daily activities?: Yes Is the patient deaf or have difficulty hearing?: No Does the patient have difficulty seeing, even when wearing glasses/contacts?: Yes Does the patient have difficulty concentrating, remembering, or making decisions?: Yes Patient able to express need for assistance with ADLs?: Yes Does the patient have difficulty dressing or bathing?: Yes Independently performs ADLs?: No Does the patient have difficulty walking or climbing stairs?: Yes Weakness of Legs: None Weakness of Arms/Hands: None  Permission Sought/Granted                   Emotional Assessment              Admission diagnosis:  GI bleeding [K92.2] Anemia, unspecified type [D64.9] Patient Active Problem List   Diagnosis Date Noted   Anemia due to blood loss, acute 01/29/2022   Upper GI bleed 01/29/2022   GI bleeding 01/28/2022   Cerebrovascular accident (CVA) due to embolism of right middle cerebral artery (HCC) 01/05/2022   Hypotension due to hypovolemia 01/02/2022   Acute cystitis without hematuria 01/02/2022   High anion gap metabolic acidosis 01/02/2022   Seizures (HCC) 01/02/2022   Shock (HCC) 01/02/2022   Sepsis (HCC) 01/01/2022   Hypovolemic shock (HCC) 01/26/2021   Syncope 09/19/2020   Current chronic use of systemic steroids 09/19/2020   Protein-calorie malnutrition, severe 09/12/2020   Hypokalemia 09/10/2020   Dysphagia 09/10/2020   Diarrhea 09/10/2020   Dehydration 09/10/2020   Transaminitis 09/10/2020   AKI (acute kidney injury) (HCC) 09/10/2020   Lymphocytic colitis 09/10/2020   Rib pain on right side 09/10/2020   SOB (shortness of breath)    PCP:  Raquel James, MD Pharmacy:   [id number] - [place], [place] - [address] [place] [address]  Phone: [phone] Fax: [phone]  [facility] [address] [place], [place] - [address] [place] [address] Phone: [phone] Fax: [phone]  [facility] [address] [place] [address] Phone: [phone] Fax: [phone]     Social Determinants of Health (SDOH) Interventions    Readmission Risk Interventions     No data to display

## 2022-01-29 NOTE — Progress Notes (Signed)
PROGRESS NOTE    Wanda Valdez  ZOX:[id number] DOB: 03/12/1945 DOA: 01/28/2022 PCP: Raquel JamesZakhia, Karl, MD     Brief Narrative:  Wanda Valdez is a 76 year old female with PMHx of lymphocytic colitis, HTN, recent right shoulder surgery.  She was also recently admitted 01/01/2022 to 01/09/2022.  During that hospitalization she was diagnosed with acute cystitis and acute CVA, started on aspirin and Plavix.  She also had a EEG that showed evidence of seizures.  She was discharged home with home health.  Since discharge, patient states she is continue to feel ill.   Labs revealed anemia with hemoglobin of 7, heme positive stool.  Patient was referred for admission and GI consulted.  New events last 24 hours / Subjective: Patient is a very poor historian, does not know what medications she is on, states that she takes Tylenol.  Does not know anything about aspirin or Plavix.  Admits to some melanotic stools  Assessment & Plan:   Principal Problem:   GI bleeding Active Problems:   Dysphagia   Protein-calorie malnutrition, severe   Current chronic use of systemic steroids   Seizures (HCC)   Cerebrovascular accident (CVA) due to embolism of right middle cerebral artery (HCC)   Anemia due to blood loss, acute   Upper GI bleed   GI bleeding, symptomatic anemia, acute blood loss anemia -Transfused 2 unit packed red blood cells today -Aspirin and Plavix on hold -GI consulted -PPI IV -Clear liquid diet per GI  History of CVA -Aspirin and Plavix on hold due to GI bleeding  Seizure disorder -Continue Depakote, Vimpat  Dysphagia -MBS planned    DVT prophylaxis:  SCDs Start: 01/29/22 0215  Code Status: Full Family Communication: None at bedside  Disposition Plan:  Status is: Inpatient Remains inpatient appropriate because: Blood transfusion today, GI consulted  Antimicrobials:  Anti-infectives (From admission, onward)    None        Objective: Vitals:   01/29/22  0905 01/29/22 0942 01/29/22 1132 01/29/22 1200  BP: 133/67 (!) 144/74 (!) 153/72 (!) 152/87  Pulse: 71 66 65 71  Resp: 16  14   Temp: 97.6 F (36.4 C) 97.6 F (36.4 C) (!) 97.5 F (36.4 C) 97.7 F (36.5 C)  TempSrc: Oral Oral Oral Oral  SpO2: 97% 100% 100% 100%  Weight:      Height:        Intake/Output Summary (Last 24 hours) at 01/29/2022 1234 Last data filed at 01/29/2022 1204 Gross per 24 hour  Intake 1583.42 ml  Output 180 ml  Net 1403.42 ml   Filed Weights   01/28/22 1708  Weight: 41.6 kg    Examination:  General exam: Appears calm and comfortable, frail-appearing Respiratory system: Clear to auscultation. Respiratory effort normal.  Cardiovascular system: S1 & S2 heard, RRR. No murmurs. No pedal edema. Gastrointestinal system: Abdomen is nondistended, soft and nontender. Normal bowel sounds heard. Central nervous system: Alert Extremities: Symmetric in appearance  Skin: No rashes, lesions or ulcers on exposed skin  Psychiatry: Very poor historian  Data Reviewed: I have personally reviewed following labs and imaging studies  CBC: Recent Labs  Lab 01/28/22 1713 01/29/22 0544  WBC 8.0  --   NEUTROABS 5.4  --   HGB 7.0* 5.9*  HCT 22.2* 19.4*  MCV 103.7*  --   PLT 245  --    Basic Metabolic Panel: Recent Labs  Lab 01/28/22 1713 01/29/22 0544  NA 139 139  K 3.8 3.6  CL 107 112*  CO2  23 21*  GLUCOSE 113* 82  BUN 26* 21  CREATININE 1.18* 0.96  CALCIUM 8.9 8.1*  MG  --  1.8   GFR: Estimated Creatinine Clearance: 31.2 mL/min (by C-G formula based on SCr of 0.96 mg/dL). Liver Function Tests: Recent Labs  Lab 01/28/22 1713  AST 24  ALT 10  ALKPHOS 70  BILITOT 0.6  PROT 6.6  ALBUMIN 2.4*   No results for input(s): "LIPASE", "AMYLASE" in the last 168 hours. No results for input(s): "AMMONIA" in the last 168 hours. Coagulation Profile: Recent Labs  Lab 01/28/22 1713  INR 1.1   Cardiac Enzymes: No results for input(s): "CKTOTAL", "CKMB",  "CKMBINDEX", "TROPONINI" in the last 168 hours. BNP (last 3 results) No results for input(s): "PROBNP" in the last 8760 hours. HbA1C: No results for input(s): "HGBA1C" in the last 72 hours. CBG: Recent Labs  Lab 01/28/22 1715  GLUCAP 84   Lipid Profile: No results for input(s): "CHOL", "HDL", "LDLCALC", "TRIG", "CHOLHDL", "LDLDIRECT" in the last 72 hours. Thyroid Function Tests: No results for input(s): "TSH", "T4TOTAL", "FREET4", "T3FREE", "THYROIDAB" in the last 72 hours. Anemia Panel: No results for input(s): "VITAMINB12", "FOLATE", "FERRITIN", "TIBC", "IRON", "RETICCTPCT" in the last 72 hours. Sepsis Labs: No results for input(s): "PROCALCITON", "LATICACIDVEN" in the last 168 hours.  Recent Results (from the past 240 hour(s))  Resp Panel by RT-PCR (Flu A&B, Covid) Anterior Nasal Swab     Status: None   Collection Time: 01/28/22  5:20 PM   Specimen: Anterior Nasal Swab  Result Value Ref Range Status   SARS Coronavirus 2 by RT PCR NEGATIVE NEGATIVE Final    Comment: (NOTE) SARS-CoV-2 target nucleic acids are NOT DETECTED.  The SARS-CoV-2 RNA is generally detectable in upper respiratory specimens during the acute phase of infection. The lowest concentration of SARS-CoV-2 viral copies this assay can detect is 138 copies/mL. A negative result does not preclude SARS-Cov-2 infection and should not be used as the sole basis for treatment or other patient management decisions. A negative result may occur with  improper specimen collection/handling, submission of specimen other than nasopharyngeal swab, presence of viral mutation(s) within the areas targeted by this assay, and inadequate number of viral copies(<138 copies/mL). A negative result must be combined with clinical observations, patient history, and epidemiological information. The expected result is Negative.  Fact Sheet for Patients:  BloggerCourse.com  Fact Sheet for Healthcare Providers:   SeriousBroker.it  This test is no t yet approved or cleared by the [place] FDA and  has been authorized for detection and/or diagnosis of SARS-CoV-2 by FDA under an Emergency Use Authorization (EUA). This EUA will remain  in effect (meaning this test can be used) for the duration of the COVID-19 declaration under Section 564(b)(1) of the Act, 21 U.S.C.section 360bbb-3(b)(1), unless the authorization is terminated  or revoked sooner.       Influenza A by PCR NEGATIVE NEGATIVE Final   Influenza B by PCR NEGATIVE NEGATIVE Final    Comment: (NOTE) The Xpert Xpress SARS-CoV-2/FLU/RSV plus assay is intended as an aid in the diagnosis of influenza from Nasopharyngeal swab specimens and should not be used as a sole basis for treatment. Nasal washings and aspirates are unacceptable for Xpert Xpress SARS-CoV-2/FLU/RSV testing.  Fact Sheet for Patients: BloggerCourse.com  Fact Sheet for Healthcare Providers: SeriousBroker.it  This test is not yet approved or cleared by the [place] FDA and has been authorized for detection and/or diagnosis of SARS-CoV-2 by FDA under an Emergency Use Authorization (EUA).  This EUA will remain in effect (meaning this test can be used) for the duration of the COVID-19 declaration under Section 564(b)(1) of the Act, 21 U.S.C. section 360bbb-3(b)(1), unless the authorization is terminated or revoked.  Performed at [facility], [address] [place], [place] [address]       Radiology Studies: CT ANGIO HEAD NECK W WO CM  Result Date: 01/28/2022 CLINICAL DATA:  Stroke follow-up. Stroke 4 weeks ago now with difficulty walking, memory, incontinence of bowel and bladder EXAM: CT ANGIOGRAPHY HEAD AND NECK TECHNIQUE: Multidetector CT imaging of the head and neck was performed using the standard protocol during bolus administration of intravenous contrast.  Multiplanar CT image reconstructions and MIPs were obtained to evaluate the vascular anatomy. Carotid stenosis measurements (when applicable) are obtained utilizing NASCET criteria, using the distal internal carotid diameter as the denominator. RADIATION DOSE REDUCTION: This exam was performed according to the departmental dose-optimization program which includes automated exposure control, adjustment of the mA and/or kV according to patient size and/or use of iterative reconstruction technique. CONTRAST:  62mL OMNIPAQUE IOHEXOL 350 MG/ML SOLN COMPARISON:  MRI head 01/04/2022.  CT angio head neck 01/01/2022 FINDINGS: CT HEAD FINDINGS Brain: Hypodensity in the right posterior parietal lobe. This is site of restricted diffusion on 01/04/2022 and is compatible with resolving infarct. No new infarct. No hemorrhage or mass. Ventricle size normal. Vascular: Negative for hyperdense vessel Skull: Negative Sinuses/Orbits: Small air-fluid levels in the maxillary sinus bilaterally. Remaining sinuses clear. Bilateral cataract extraction Other: None Review of the MIP images confirms the above findings CTA NECK FINDINGS Aortic arch: Atherosclerotic calcification in the aortic arch. 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 Right carotid system: Atherosclerotic calcification right carotid bifurcation and proximal right internal carotid artery. Proximally 25% diameter stenosis proximal right internal carotid artery unchanged from the prior study. Left carotid system: Atherosclerotic calcification left carotid bifurcation and proximal left internal carotid artery. No significant stenosis. Vertebral arteries: Left vertebral artery dominant. Both vertebral arteries patent to the skull base without stenosis. 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 Cervical spondylosis. No acute skeletal abnormality. Other neck: Negative for mass or adenopathy Upper chest: Lung apices clear bilaterally. Review of  the MIP images confirms the above findings CTA HEAD FINDINGS Anterior circulation: Mild atherosclerotic disease in the cavernous carotid bilaterally. No significant stenosis. Anterior and middle cerebral arteries patent bilaterally without stenosis or large vessel occlusion. Posterior circulation: Both vertebral arteries patent to the basilar. PICA patent bilaterally. Basilar patent. Fetal origin posterior cerebral artery bilaterally. No stenosis or large vessel occlusion. Venous sinuses: Normal venous enhancement Anatomic variants: None Review of the MIP images confirms the above findings IMPRESSION: 1. Evolving infarct right posterior parietal lobe. No acute intracranial abnormality. 2. Atherosclerotic disease in the aortic arch and carotid bifurcation bilaterally. 25% diameter stenosis proximal right internal carotid artery unchanged. No significant left carotid stenosis. Both vertebral arteries patent without stenosis. 3. 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 4. No intracranial large vessel occlusion or significant stenosis. 5. Small air-fluid levels in the maxillary sinus bilaterally. 6. Aortic atherosclerosis. Aortic Atherosclerosis (ICD10-I70.0). Electronically Signed   By: Marlan Palau M.D.   On: 01/28/2022 18:52   DG Chest Port 1 View  Result Date: 01/28/2022 CLINICAL DATA:  Cough EXAM: PORTABLE CHEST 1 VIEW COMPARISON:  01/01/2022 FINDINGS: Aortic is cardiac silhouette appears prominent. No pneumothorax or pleural effusion identified. Normal pulmonary vasculature. The ascending thoracic aorta is ectatic and tortuous. There is  aortic calcification. IMPRESSION: Prominent cardiac silhouette. Lungs are clear. Tortuous ectatic thoracic aorta. Electronically Signed   By: Layla Maw M.D.   On: 01/28/2022 18:38      Scheduled Meds:  influenza vaccine adjuvanted  0.5 mL Intramuscular Tomorrow-1000   pneumococcal 20-valent conjugate vaccine  0.5 mL Intramuscular  Tomorrow-1000   sucralfate  1 g Oral TID WC & HS   Continuous Infusions:  sodium chloride 75 mL/hr at 01/29/22 0230   lacosamide (VIMPAT) IV     pantoprazole 8 mg/hr (01/29/22 0720)   valproate sodium       LOS: 1 day     Noralee Stain, DO [facility] 01/29/2022, 12:34 PM   Available via Epic secure chat 7am-7pm After these hours, please refer to coverage provider listed on [email or web address]

## 2022-01-29 NOTE — Progress Notes (Signed)
Assumed care of pt at this time, pt in bed, pure-wick in place, tele box re-applied, bed alarm activated and audible, no signs of distress, call bell in reach

## 2022-01-29 NOTE — Progress Notes (Signed)
Date and time results received: 01/29/22 0600  Reported by: Corrie Dandy, Hematology Reported to: Pamelia Hoit, RN   Test: Hemoglobin Critical Value: 5.9  Name of Provider Notified: A. Virgel Manifold  Orders Received? Or Actions Taken?: MD notified, awaiting orders

## 2022-01-30 ENCOUNTER — Encounter (HOSPITAL_COMMUNITY)
Admission: EM | Disposition: A | Discharge status: Skilled Nursing Facility | Payer: Self-pay | Source: Home / Self Care | Attending: Internal Medicine

## 2022-01-30 ENCOUNTER — Inpatient Hospital Stay (HOSPITAL_COMMUNITY): Payer: Medicare Other | Admitting: Anesthesiology

## 2022-01-30 ENCOUNTER — Encounter (HOSPITAL_COMMUNITY): Payer: Self-pay | Admitting: Family Medicine

## 2022-01-30 DIAGNOSIS — K269 Duodenal ulcer, unspecified as acute or chronic, without hemorrhage or perforation: Secondary | ICD-10-CM | POA: Diagnosis not present

## 2022-01-30 DIAGNOSIS — K2970 Gastritis, unspecified, without bleeding: Secondary | ICD-10-CM

## 2022-01-30 DIAGNOSIS — F17210 Nicotine dependence, cigarettes, uncomplicated: Secondary | ICD-10-CM

## 2022-01-30 DIAGNOSIS — K921 Melena: Secondary | ICD-10-CM | POA: Diagnosis not present

## 2022-01-30 DIAGNOSIS — K31819 Angiodysplasia of stomach and duodenum without bleeding: Secondary | ICD-10-CM

## 2022-01-30 DIAGNOSIS — K259 Gastric ulcer, unspecified as acute or chronic, without hemorrhage or perforation: Secondary | ICD-10-CM | POA: Diagnosis not present

## 2022-01-30 DIAGNOSIS — I10 Essential (primary) hypertension: Secondary | ICD-10-CM

## 2022-01-30 HISTORY — PX: ESOPHAGOGASTRODUODENOSCOPY: SHX5428

## 2022-01-30 HISTORY — PX: HEMOSTASIS CLIP PLACEMENT: SHX6857

## 2022-01-30 HISTORY — PX: BIOPSY: SHX5522

## 2022-01-30 LAB — BPAM RBC
Blood Product Expiration Date: 202312252359
Blood Product Expiration Date: 202312252359
ISSUE DATE / TIME: 202311300922
ISSUE DATE / TIME: 202311301325
Unit Type and Rh: 5100
Unit Type and Rh: 5100

## 2022-01-30 LAB — TYPE AND SCREEN
ABO/RH(D): O POS
Antibody Screen: NEGATIVE
Unit division: 0
Unit division: 0

## 2022-01-30 LAB — URINE CULTURE: Culture: 70000 — AB

## 2022-01-30 LAB — CBC
HCT: 30.2 % — ABNORMAL LOW (ref 36.0–46.0)
Hemoglobin: 10 g/dL — ABNORMAL LOW (ref 12.0–15.0)
MCH: 31.3 pg (ref 26.0–34.0)
MCHC: 33.1 g/dL (ref 30.0–36.0)
MCV: 94.7 fL (ref 80.0–100.0)
Platelets: 164 10*3/uL (ref 150–400)
RBC: 3.19 MIL/uL — ABNORMAL LOW (ref 3.87–5.11)
RDW: 18.6 % — ABNORMAL HIGH (ref 11.5–15.5)
WBC: 9.8 10*3/uL (ref 4.0–10.5)
nRBC: 0.2 % (ref 0.0–0.2)

## 2022-01-30 LAB — HEMOGLOBIN AND HEMATOCRIT, BLOOD
HCT: 28.4 % — ABNORMAL LOW (ref 36.0–46.0)
Hemoglobin: 9.4 g/dL — ABNORMAL LOW (ref 12.0–15.0)

## 2022-01-30 SURGERY — ESOPHAGOGASTRODUODENOSCOPY (EGD)
Anesthesia: Monitor Anesthesia Care

## 2022-01-30 MED ORDER — EPHEDRINE SULFATE-NACL 50-0.9 MG/10ML-% IV SOSY
PREFILLED_SYRINGE | INTRAVENOUS | Status: DC | PRN
  Administered 2022-01-30: 5 mg via INTRAVENOUS

## 2022-01-30 MED ORDER — PHENYLEPHRINE 80 MCG/ML (10ML) SYRINGE FOR IV PUSH (FOR BLOOD PRESSURE SUPPORT)
PREFILLED_SYRINGE | INTRAVENOUS | Status: DC | PRN
  Administered 2022-01-30: 80 ug via INTRAVENOUS

## 2022-01-30 MED ORDER — PROPOFOL 10 MG/ML IV BOLUS
INTRAVENOUS | Status: DC | PRN
  Administered 2022-01-30: 10 mg via INTRAVENOUS
  Administered 2022-01-30 (×2): 20 mg via INTRAVENOUS

## 2022-01-30 MED ORDER — PROPOFOL 500 MG/50ML IV EMUL
INTRAVENOUS | Status: DC | PRN
  Administered 2022-01-30: 100 ug/kg/min via INTRAVENOUS

## 2022-01-30 MED ORDER — PANTOPRAZOLE SODIUM 40 MG PO TBEC
40.0000 mg | DELAYED_RELEASE_TABLET | Freq: Two times a day (BID) | ORAL | Status: DC
  Administered 2022-01-30 – 2022-02-06 (×13): 40 mg via ORAL
  Filled 2022-01-30 (×12): qty 1

## 2022-01-30 MED ORDER — LACTATED RINGERS IV SOLN: INTRAVENOUS | Status: DC | PRN

## 2022-01-30 NOTE — Progress Notes (Signed)
PROGRESS NOTE    Wanda Valdez  NTI:[id number] DOB: 1946-01-21 DOA: 01/28/2022 PCP: Raquel James, MD     Brief Narrative:  Wanda Valdez is a 76 year old female with PMHx of lymphocytic colitis, HTN, recent right shoulder surgery.  She was also recently admitted 01/01/2022 to 01/09/2022.  During that hospitalization she was diagnosed with acute cystitis and acute CVA, started on aspirin and Plavix.  She also had a EEG that showed evidence of seizures.  She was discharged home with home health.  Since discharge, patient states she is continue to feel ill.   Labs revealed anemia with hemoglobin of 7, heme positive stool.  Patient was referred for admission and GI consulted.  New events last 24 hours / Subjective: Patient is a very poor historian.  She is oriented to place (doctors office), year (23), but not to situation.  When I first entered the room, she stated "Spike is right outside there."   Assessment & Plan:   Principal Problem:   GI bleeding Active Problems:   Dysphagia   Protein-calorie malnutrition, severe   Current chronic use of systemic steroids   Seizures (HCC)   Cerebrovascular accident (CVA) due to embolism of right middle cerebral artery (HCC)   Anemia due to blood loss, acute   Upper GI bleed   GI bleeding, symptomatic anemia, acute blood loss anemia -S/p 2 units blood transfusions.  Hemoglobin stable this morning -Aspirin and Plavix on hold -GI consulted -PPI IV -EGD planned for today  History of CVA -Aspirin and Plavix on hold due to GI bleeding  Seizure disorder -Continue Depakote, Vimpat  Dysphagia -MBS planned   Bacteriuria -Urine culture showing gram-positive cocci.  Hold off on antibiotics at this time   DVT prophylaxis:  SCDs Start: 01/29/22 0215  Code Status: Full Family Communication: None at bedside  Disposition Plan:  Status is: Inpatient Remains inpatient appropriate because: EGD today  Antimicrobials:  Anti-infectives  (From admission, onward)    None        Objective: Vitals:   01/29/22 1423 01/29/22 1630 01/29/22 2048 01/30/22 0452  BP: (!) 158/84 (!) 171/84 (!) 149/85 (!) 157/91  Pulse: 73 64 72 79  Resp: 18  14 14   Temp: 97.9 F (36.6 C) 98.4 F (36.9 C) 98.6 F (37 C) 98.5 F (36.9 C)  TempSrc: Oral Oral Oral Oral  SpO2: 100% 100% 100% 100%  Weight:      Height:        Intake/Output Summary (Last 24 hours) at 01/30/2022 1059 Last data filed at 01/30/2022 0100 Gross per 24 hour  Intake 2633.84 ml  Output 350 ml  Net 2283.84 ml    Filed Weights   01/28/22 1708  Weight: 41.6 kg    Examination:  General exam: Appears calm and comfortable, frail-appearing Respiratory system: Clear to auscultation. Respiratory effort normal.  Cardiovascular system: S1 & S2 heard, RRR. No murmurs. No pedal edema. Gastrointestinal system: Abdomen is nondistended, soft and nontender. Normal bowel sounds heard. Central nervous system: Alert Extremities: Symmetric in appearance  Skin: No rashes, lesions or ulcers on exposed skin  Psychiatry: Very poor historian  Data Reviewed: I have personally reviewed following labs and imaging studies  CBC: Recent Labs  Lab 01/28/22 1713 01/29/22 0544 01/29/22 2113 01/30/22 0257 01/30/22 0556  WBC 8.0  --   --  9.8  --   NEUTROABS 5.4  --   --   --   --   HGB 7.0* 5.9* 9.4* 10.0* 9.4*  HCT 22.2*  19.4* 28.4* 30.2* 28.4*  MCV 103.7*  --   --  94.7  --   PLT 245  --   --  164  --     Basic Metabolic Panel: Recent Labs  Lab 01/28/22 1713 01/29/22 0544  NA 139 139  K 3.8 3.6  CL 107 112*  CO2 23 21*  GLUCOSE 113* 82  BUN 26* 21  CREATININE 1.18* 0.96  CALCIUM 8.9 8.1*  MG  --  1.8    GFR: Estimated Creatinine Clearance: 31.2 mL/min (by C-G formula based on SCr of 0.96 mg/dL). Liver Function Tests: Recent Labs  Lab 01/28/22 1713  AST 24  ALT 10  ALKPHOS 70  BILITOT 0.6  PROT 6.6  ALBUMIN 2.4*    No results for input(s): "LIPASE",  "AMYLASE" in the last 168 hours. No results for input(s): "AMMONIA" in the last 168 hours. Coagulation Profile: Recent Labs  Lab 01/28/22 1713  INR 1.1    Cardiac Enzymes: No results for input(s): "CKTOTAL", "CKMB", "CKMBINDEX", "TROPONINI" in the last 168 hours. BNP (last 3 results) No results for input(s): "PROBNP" in the last 8760 hours. HbA1C: No results for input(s): "HGBA1C" in the last 72 hours. CBG: Recent Labs  Lab 01/28/22 1715  GLUCAP 84    Lipid Profile: No results for input(s): "CHOL", "HDL", "LDLCALC", "TRIG", "CHOLHDL", "LDLDIRECT" in the last 72 hours. Thyroid Function Tests: No results for input(s): "TSH", "T4TOTAL", "FREET4", "T3FREE", "THYROIDAB" in the last 72 hours. Anemia Panel: No results for input(s): "VITAMINB12", "FOLATE", "FERRITIN", "TIBC", "IRON", "RETICCTPCT" in the last 72 hours. Sepsis Labs: No results for input(s): "PROCALCITON", "LATICACIDVEN" in the last 168 hours.  Recent Results (from the past 240 hour(s))  Resp Panel by RT-PCR (Flu A&B, Covid) Anterior Nasal Swab     Status: None   Collection Time: 01/28/22  5:20 PM   Specimen: Anterior Nasal Swab  Result Value Ref Range Status   SARS Coronavirus 2 by RT PCR NEGATIVE NEGATIVE Final    Comment: (NOTE) SARS-CoV-2 target nucleic acids are NOT DETECTED.  The SARS-CoV-2 RNA is generally detectable in upper respiratory specimens during the acute phase of infection. The lowest concentration of SARS-CoV-2 viral copies this assay can detect is 138 copies/mL. A negative result does not preclude SARS-Cov-2 infection and should not be used as the sole basis for treatment or other patient management decisions. A negative result may occur with  improper specimen collection/handling, submission of specimen other than nasopharyngeal swab, presence of viral mutation(s) within the areas targeted by this assay, and inadequate number of viral copies(<138 copies/mL). A negative result must be combined  with clinical observations, patient history, and epidemiological information. The expected result is Negative.  Fact Sheet for Patients:  BloggerCourse.com  Fact Sheet for Healthcare Providers:  SeriousBroker.it  This test is no t yet approved or cleared by the [place] FDA and  has been authorized for detection and/or diagnosis of SARS-CoV-2 by FDA under an Emergency Use Authorization (EUA). This EUA will remain  in effect (meaning this test can be used) for the duration of the COVID-19 declaration under Section 564(b)(1) of the Act, 21 U.S.C.section 360bbb-3(b)(1), unless the authorization is terminated  or revoked sooner.       Influenza A by PCR NEGATIVE NEGATIVE Final   Influenza B by PCR NEGATIVE NEGATIVE Final    Comment: (NOTE) The Xpert Xpress SARS-CoV-2/FLU/RSV plus assay is intended as an aid in the diagnosis of influenza from Nasopharyngeal swab specimens and should not be used  as a sole basis for treatment. Nasal washings and aspirates are unacceptable for Xpert Xpress SARS-CoV-2/FLU/RSV testing.  Fact Sheet for Patients: BloggerCourse.com  Fact Sheet for Healthcare Providers: SeriousBroker.it  This test is not yet approved or cleared by the [place] FDA and has been authorized for detection and/or diagnosis of SARS-CoV-2 by FDA under an Emergency Use Authorization (EUA). This EUA will remain in effect (meaning this test can be used) for the duration of the COVID-19 declaration under Section 564(b)(1) of the Act, 21 U.S.C. section 360bbb-3(b)(1), unless the authorization is terminated or revoked.  Performed at [facility], [address] [place], [place] [address]   Urine Culture     Status: Abnormal (Preliminary result)   Collection Time: 01/28/22  9:14 PM   Specimen: Urine, Clean Catch  Result Value Ref Range Status   Specimen  Description   Final    URINE, CLEAN CATCH Performed at [facility], [address] [place], [place] [address]    Special Requests   Final    NONE Performed at [facility], [address] [place], [place] [address]    Culture (A)  Final    70,000 COLONIES/mL GRAM POSITIVE COCCI IDENTIFICATION AND SUSCEPTIBILITIES TO FOLLOW Performed at [facility], [address], [place], [place] [address]    Report Status PENDING  Incomplete      Radiology Studies: CT ANGIO HEAD NECK W WO CM  Result Date: 01/28/2022 CLINICAL DATA:  Stroke follow-up. Stroke 4 weeks ago now with difficulty walking, memory, incontinence of bowel and bladder EXAM: CT ANGIOGRAPHY HEAD AND NECK TECHNIQUE: Multidetector CT imaging of the head and neck was performed using the standard protocol during bolus administration of intravenous contrast. Multiplanar CT image reconstructions and MIPs were obtained to evaluate the vascular anatomy. Carotid stenosis measurements (when applicable) are obtained utilizing NASCET criteria, using the distal internal carotid diameter as the denominator. RADIATION DOSE REDUCTION: This exam was performed according to the departmental dose-optimization program which includes automated exposure control, adjustment of the mA and/or kV according to patient size and/or use of iterative reconstruction technique. CONTRAST:  40mL OMNIPAQUE IOHEXOL 350 MG/ML SOLN COMPARISON:  MRI head 01/04/2022.  CT angio head neck 01/01/2022 FINDINGS: CT HEAD FINDINGS Brain: Hypodensity in the right posterior parietal lobe. This is site of restricted diffusion on 01/04/2022 and is compatible with resolving infarct. No new infarct. No hemorrhage or mass. Ventricle size normal. Vascular: Negative for hyperdense vessel Skull: Negative Sinuses/Orbits: Small air-fluid levels in the maxillary sinus bilaterally. Remaining sinuses clear. Bilateral cataract extraction Other: None Review of the MIP images  confirms the above findings CTA NECK FINDINGS Aortic arch: Atherosclerotic calcification in the aortic arch. 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 Right carotid system: Atherosclerotic calcification right carotid bifurcation and proximal right internal carotid artery. Proximally 25% diameter stenosis proximal right internal carotid artery unchanged from the prior study. Left carotid system: Atherosclerotic calcification left carotid bifurcation and proximal left internal carotid artery. No significant stenosis. Vertebral arteries: Left vertebral artery dominant. Both vertebral arteries patent to the skull base without stenosis. 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 Cervical spondylosis. No acute skeletal abnormality. Other neck: Negative for mass or adenopathy Upper chest: Lung apices clear bilaterally. Review of the MIP images confirms the above findings CTA HEAD FINDINGS Anterior circulation: Mild atherosclerotic disease in the cavernous carotid bilaterally. No significant stenosis. Anterior and middle cerebral arteries patent bilaterally without stenosis  or large vessel occlusion. Posterior circulation: Both vertebral arteries patent to the basilar. PICA patent bilaterally. Basilar patent. Fetal origin posterior cerebral artery bilaterally. No stenosis or large vessel occlusion. Venous sinuses: Normal venous enhancement Anatomic variants: None Review of the MIP images confirms the above findings IMPRESSION: 1. Evolving infarct right posterior parietal lobe. No acute intracranial abnormality. 2. Atherosclerotic disease in the aortic arch and carotid bifurcation bilaterally. 25% diameter stenosis proximal right internal carotid artery unchanged. No significant left carotid stenosis. Both vertebral arteries patent without stenosis. 3. 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 4. No intracranial large vessel occlusion or significant stenosis. 5.  Small air-fluid levels in the maxillary sinus bilaterally. 6. Aortic atherosclerosis. Aortic Atherosclerosis (ICD10-I70.0). Electronically Signed   By: Marlan Palauharles  Clark M.D.   On: 01/28/2022 18:52   DG Chest Port 1 View  Result Date: 01/28/2022 CLINICAL DATA:  Cough EXAM: PORTABLE CHEST 1 VIEW COMPARISON:  01/01/2022 FINDINGS: Aortic is cardiac silhouette appears prominent. No pneumothorax or pleural effusion identified. Normal pulmonary vasculature. The ascending thoracic aorta is ectatic and tortuous. There is aortic calcification. IMPRESSION: Prominent cardiac silhouette. Lungs are clear. Tortuous ectatic thoracic aorta. Electronically Signed   By: Layla MawJoshua  Pleasure M.D.   On: 01/28/2022 18:38      Scheduled Meds:  influenza vaccine adjuvanted  0.5 mL Intramuscular Tomorrow-1000   pneumococcal 20-valent conjugate vaccine  0.5 mL Intramuscular Tomorrow-1000   sucralfate  1 g Oral TID WC & HS   Continuous Infusions:  sodium chloride 75 mL/hr at 01/30/22 0059   lacosamide (VIMPAT) IV 100 mg (01/30/22 1022)   pantoprazole 8 mg/hr (01/30/22 0550)   valproate sodium 500 mg (01/30/22 0552)     LOS: 2 days     Noralee StainJennifer Norman Bier, DO [facility] 01/30/2022, 10:59 AM   Available via Epic secure chat 7am-7pm After these hours, please refer to coverage provider listed on [email or web address]

## 2022-01-30 NOTE — Anesthesia Procedure Notes (Signed)
Procedure Name: MAC Date/Time: 01/30/2022 11:54 AM  Performed by: Niel Hummer, CRNAPre-anesthesia Checklist: Patient identified, Emergency Drugs available, Suction available and Patient being monitored Oxygen Delivery Method: Simple face mask

## 2022-01-30 NOTE — Plan of Care (Signed)
Discussed earlier in front of patient and family plan of care for the evening, pain management and medications with some teach back displayed.  Patient requested door stay slightly opened.   Problem: Education: Goal: Understanding of CV disease, CV risk reduction, and recovery process will improve Outcome: Progressing

## 2022-01-30 NOTE — Transfer of Care (Signed)
Immediate Anesthesia Transfer of Care Note  Patient: Wanda Valdez  Procedure(s) Performed: ESOPHAGOGASTRODUODENOSCOPY (EGD) HEMOSTASIS CLIP PLACEMENT BIOPSY  Patient Location: Endoscopy Unit  Anesthesia Type:MAC  Level of Consciousness: drowsy  Airway & Oxygen Therapy: Patient Spontanous Breathing and Patient connected to face mask oxygen  Post-op Assessment: Report given to RN and Post -op Vital signs reviewed and stable  Post vital signs: Reviewed and stable  Last Vitals:  Vitals Value Taken Time  BP    Temp    Pulse 60 01/30/22 1235  Resp 23 01/30/22 1235  SpO2 100 % 01/30/22 1235  Vitals shown include unvalidated device data.  Last Pain:  Vitals:   01/30/22 1109  TempSrc: Temporal  PainSc: 0-No pain         Complications: No notable events documented.

## 2022-01-30 NOTE — Op Note (Signed)
[facility] Patient Name: Wanda Valdez Procedure Date: 01/30/2022 MRN: [id number] Attending MD: Liliane Shi DO, DO, [id number] Date of Birth: [birth year] CSN: [id number] Age: 76 Admit Type: Inpatient Procedure:                Upper GI endoscopy Indications:              Melena Providers:                Liliane Shi DO, DO, Marge Duncans, RN, [facility] Technician, Technician Referring MD:              Medicines:                See the Anesthesia note for documentation of the                            administered medications Complications:            No immediate complications. Estimated blood loss:                            Minimal. Estimated Blood Loss:     Estimated blood loss was minimal. Procedure:                Pre-Anesthesia Assessment:                           - ASA Grade Assessment: III - A patient with severe                            systemic disease.                           - The risks and benefits of the procedure and the                            sedation options and risks were discussed with the                            patient. All questions were answered and informed                            consent was obtained.                           After obtaining informed consent, the endoscope was                            passed under direct vision. Throughout the                            procedure, the patient's blood pressure, pulse, and                            oxygen saturations were monitored continuously. The  [id number]-[id number] ([id number]) Olympus endoscope was introduced                            through the mouth, and advanced to the second part                            of duodenum. The upper GI endoscopy was somewhat                            difficult due to a J-shaped stomach which made                            pyloric intubation difficult. Successful completion                             of the procedure was aided by straightening and                            shortening the scope to obtain bowel loop                            reduction. The patient tolerated the procedure well. Scope In: Scope Out: Findings:      The Z-line was regular and was found 36 cm from the incisors. No       biopsies or other specimens were collected for this exam. Some mucosal       disruption occurred after upper endoscopy, no biopsies were obtained, no       dilatation was performed.      One non-bleeding linear gastric ulcer with a clean ulcer base (Forrest       Class III) was found at the pylorus. The lesion was 4 mm in largest       dimension. No biopsies or other specimens were collected for this exam.      Localized mild inflammation characterized by congestion (edema) and       erythema was found in the gastric antrum. Biopsies were taken with a       cold forceps for Helicobacter pylori testing.      Localized mild inflammation characterized by erythema was found in the       duodenal bulb. No biopsies or other specimens were collected for this       exam.      Two non-bleeding cratered duodenal ulcers with a clean ulcer base       (Forrest Class III) were found in the duodenal bulb. The largest lesion       was 4 mm in largest dimension. No biopsies or other specimens were       collected for this exam.      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 To prevent bleeding post-intervention,       two hemostatic clips were successfully placed. There was no bleeding at       the end of the procedure. Impression:               - Z-line regular, 36 cm from the incisors. No  specimens collected.                           - Non-bleeding gastric ulcer with a clean ulcer                            base (Forrest Class III). No specimens collected.                           -  Gastritis. Biopsied.                           - Duodenitis. No specimens collected.                           - Non-bleeding duodenal ulcers with a clean ulcer                            base (Forrest Class III). No specimens collected.                           - Two non-bleeding angiodysplastic lesions in the                            duodenum. Clips were placed. Moderate Sedation:      See the other procedure note for documentation of moderate sedation with       intraservice time. Recommendation:           - Return patient to hospital ward for ongoing care.                           - Use Protonix (pantoprazole) 40 mg PO BID for 2                            months.                           - Resume previous diet indefinitely.                           - Await pathology results. Procedure Code(s):        --- Professional ---                           [phone], Esophagogastroduodenoscopy, flexible,                            transoral; with biopsy, single or multiple Diagnosis Code(s):        --- Professional ---                           K25.9, Gastric ulcer, unspecified as acute or                            chronic, without hemorrhage or perforation  K29.70, Gastritis, unspecified, without bleeding                           K26.9, Duodenal ulcer, unspecified as acute or                            chronic, without hemorrhage or perforation                           K31.819, Angiodysplasia of stomach and duodenum                            without bleeding                           K92.1, Melena (includes Hematochezia) CPT copyright 2022 American Medical Association. All rights reserved. The codes documented in this report are preliminary and upon coder review may  be revised to meet current compliance requirements. Dr Liliane Shilaire Adrielle Polakowski, DO Liliane Shilaire Theophile Harvie DO, DO 01/30/2022 12:35:53 PM Number of Addenda: 0

## 2022-01-30 NOTE — Anesthesia Postprocedure Evaluation (Signed)
Anesthesia Post Note  Patient: Wanda Valdez  Procedure(s) Performed: ESOPHAGOGASTRODUODENOSCOPY (EGD) HEMOSTASIS CLIP PLACEMENT BIOPSY     Patient location during evaluation: Endoscopy Anesthesia Type: MAC Level of consciousness: awake and alert, patient cooperative and oriented Pain management: pain level controlled Vital Signs Assessment: post-procedure vital signs reviewed and stable Respiratory status: nonlabored ventilation, spontaneous breathing and respiratory function stable Cardiovascular status: blood pressure returned to baseline and stable Postop Assessment: no apparent nausea or vomiting Anesthetic complications: no   No notable events documented.  Last Vitals:  Vitals:   01/30/22 1300 01/30/22 1347  BP:  (!) 145/70  Pulse: 69 (!) 57  Resp: 15 18  Temp:  36.4 C  SpO2: 100% 97%    Last Pain:  Vitals:   01/30/22 1347  TempSrc: Oral  PainSc:                  Geary Rufo,E. Keiston Manley

## 2022-01-30 NOTE — Anesthesia Preprocedure Evaluation (Addendum)
Anesthesia Evaluation  Patient identified by MRN, date of birth, ID band Patient awake    Reviewed: Allergy & Precautions, NPO status , Patient's Chart, lab work & pertinent test results, reviewed documented beta blocker date and time   History of Anesthesia Complications Negative for: history of anesthetic complications  Airway Mallampati: III  TM Distance: >3 FB Neck ROM: Full    Dental  (+) Dental Advisory Given, Poor Dentition, Chipped, Missing   Pulmonary Current Smoker and Patient abstained from smoking.   breath sounds clear to auscultation       Cardiovascular hypertension, Pt. on medications and Pt. on home beta blockers (-) angina  Rhythm:Regular Rate:Normal  '22 ECHO: EF 60-65%, normal LVF, Grade 1 DD, normal RVF, no significant valvular abnormalities   Neuro/Psych Seizures -,  CVA    GI/Hepatic Neg liver ROS,GERD  Medicated and Controlled,,  Endo/Other  negative endocrine ROS    Renal/GU Renal InsufficiencyRenal disease     Musculoskeletal   Abdominal   Peds  Hematology  (+) Blood dyscrasia (Hb 9.4), anemia   Anesthesia Other Findings   Reproductive/Obstetrics                             Anesthesia Physical Anesthesia Plan  ASA: 3  Anesthesia Plan: MAC   Post-op Pain Management: Minimal or no pain anticipated   Induction:   PONV Risk Score and Plan: 1 and Ondansetron  Airway Management Planned: Natural Airway and Nasal Cannula  Additional Equipment: None  Intra-op Plan:   Post-operative Plan:   Informed Consent: I have reviewed the patients History and Physical, chart, labs and discussed the procedure including the risks, benefits and alternatives for the proposed anesthesia with the patient or authorized representative who has indicated his/her understanding and acceptance.     Dental advisory given and Consent reviewed with POA  Plan Discussed with: CRNA and  Surgeon  Anesthesia Plan Comments: (Discussed with patient, discussed with daughter, Daniela Siebers, by telephone)       Anesthesia Quick Evaluation

## 2022-01-31 ENCOUNTER — Inpatient Hospital Stay (HOSPITAL_COMMUNITY): Payer: Medicare Other

## 2022-01-31 DIAGNOSIS — K921 Melena: Secondary | ICD-10-CM | POA: Diagnosis not present

## 2022-01-31 LAB — CBC
HCT: 33.2 % — ABNORMAL LOW (ref 36.0–46.0)
Hemoglobin: 10.7 g/dL — ABNORMAL LOW (ref 12.0–15.0)
MCH: 31.7 pg (ref 26.0–34.0)
MCHC: 32.2 g/dL (ref 30.0–36.0)
MCV: 98.2 fL (ref 80.0–100.0)
Platelets: 129 10*3/uL — ABNORMAL LOW (ref 150–400)
RBC: 3.38 MIL/uL — ABNORMAL LOW (ref 3.87–5.11)
RDW: 18.6 % — ABNORMAL HIGH (ref 11.5–15.5)
WBC: 9.8 10*3/uL (ref 4.0–10.5)
nRBC: 0 % (ref 0.0–0.2)

## 2022-01-31 MED ORDER — ATORVASTATIN CALCIUM 20 MG PO TABS
20.0000 mg | ORAL_TABLET | Freq: Every day | ORAL | Status: DC
  Administered 2022-01-31 – 2022-02-06 (×7): 20 mg via ORAL
  Filled 2022-01-31 (×7): qty 1

## 2022-01-31 MED ORDER — METOPROLOL TARTRATE 25 MG PO TABS
12.5000 mg | ORAL_TABLET | Freq: Two times a day (BID) | ORAL | Status: DC
  Administered 2022-01-31 – 2022-02-06 (×11): 12.5 mg via ORAL
  Filled 2022-01-31 (×12): qty 1

## 2022-01-31 MED ORDER — METOPROLOL TARTRATE 25 MG PO TABS: 25.0000 mg | ORAL_TABLET | Freq: Every day | ORAL | Status: DC

## 2022-01-31 MED ORDER — DIVALPROEX SODIUM 125 MG PO CSDR
500.0000 mg | DELAYED_RELEASE_CAPSULE | Freq: Three times a day (TID) | ORAL | Status: DC
  Administered 2022-01-31 – 2022-02-08 (×20): 500 mg via ORAL
  Filled 2022-01-31 (×39): qty 4

## 2022-01-31 MED ORDER — LACOSAMIDE 50 MG PO TABS
100.0000 mg | ORAL_TABLET | Freq: Two times a day (BID) | ORAL | Status: DC
  Administered 2022-01-31 – 2022-02-08 (×14): 100 mg via ORAL
  Filled 2022-01-31 (×16): qty 2

## 2022-01-31 NOTE — Progress Notes (Signed)
Lavonna Rua 12:15 PM  Subjective: Patient seen and examined and case discussed with my partner Dr. Lorenso Quarry as well as the patient's nurse and we discussed her dysphagia diet and she has had no further bleeding and had no problem from her procedure and no new complaints Objective: Vital signs stable afebrile no acute distress abdomen is soft nontender hemoglobin okay  Assessment: Resolved GI bleeding  Plan: Await pathology agree with advancing diet as per speech therapy continue pump inhibitors and call us as needed and follow-up as needed and please reevaluate blood thinner needs but if no signs of bleeding through the weekend okay to restart on Monday and subcu Lovenox or heparin is okay this weekend if needed  [facility]  office [phone] After 5PM or if no answer call [phone]

## 2022-01-31 NOTE — Progress Notes (Addendum)
PROGRESS NOTE    Wanda Valdez  ZOX:[id number] DOB: [birth year] DOA: 01/28/2022 PCP: Raquel James, MD     Brief Narrative:  Wanda Valdez is a 76 year old female with PMHx of lymphocytic colitis, HTN, recent right shoulder surgery.  She was also recently admitted 01/01/2022 to 01/09/2022.  During that hospitalization she was diagnosed with acute cystitis and acute CVA, started on aspirin and Plavix (for 3 weeks, then aspirin alone).  She also had a EEG that showed evidence of seizures.  She was discharged home with home health.  Since discharge, patient states she is continue to feel ill.   Labs revealed anemia with hemoglobin of 7, heme positive stool.  Patient was referred for admission and GI consulted.  Patient underwent EGD 12/1 which revealed nonbleeding gastric ulcer, gastritis, duodenitis, nonbleeding duodenal ulcer, nonbleeding angiodysplastic lesion.  She was recommended for Protonix 40 mg twice daily for 2 months.  New events last 24 hours / Subjective: Patient without any new complaints this morning.   Assessment & Plan:   Principal Problem:   GI bleeding Active Problems:   Dysphagia   Protein-calorie malnutrition, severe   Current chronic use of systemic steroids   Seizures (HCC)   Cerebrovascular accident (CVA) due to embolism of right middle cerebral artery (HCC)   Anemia due to blood loss, acute   Upper GI bleed   GI bleeding, symptomatic anemia, acute blood loss anemia -Transfused 2 unit packed red blood cells  -Aspirin and Plavix on hold.  Seems to have completed dual antiplatelet therapy for 3 weeks.  She should be on aspirin.  Discussed with Dr. Ewing Schlein, can resume tomorrow as long as patient is stable -GI consulted -PPI -Hemoglobin remains stable  History of CVA -Plan to resume aspirin tomorrow  Seizure disorder -Continue Depakote, Vimpat  Dysphagia -Status post MBS.  Now on dysphagia 2 diet   DVT prophylaxis:  SCDs Start: 01/29/22  0215  Code Status: Full Family Communication: None at bedside  Disposition Plan:  Status is: Inpatient Remains inpatient appropriate because: Awaiting PT OT  Antimicrobials:  Anti-infectives (From admission, onward)    None        Objective: Vitals:   01/30/22 1300 01/30/22 1347 01/30/22 2029 01/31/22 0548  BP:  (!) 145/70 (!) 155/83 (!) 179/78  Pulse: 69 (!) 57 63 72  Resp: Temp:  97.6 F (36.4 C) 97.7 F (36.5 C) (!) 97.5 F (36.4 C)  TempSrc:  Oral Oral Oral  SpO2: 100% 97% 100% 100%  Weight:      Height:        Intake/Output Summary (Last 24 hours) at 01/31/2022 1304 Last data filed at 01/31/2022 0900 Gross per 24 hour  Intake 1834.43 ml  Output 300 ml  Net 1534.43 ml    Filed Weights   01/28/22 1708  Weight: 41.6 kg    Examination:  General exam: Appears calm and comfortable, frail-appearing Respiratory system: Clear to auscultation. Respiratory effort normal.  Cardiovascular system: S1 & S2 heard, RRR. No murmurs. No pedal edema. Gastrointestinal system: Abdomen is nondistended, soft and nontender. Normal bowel sounds heard. Central nervous system: Alert Extremities: Symmetric in appearance  Skin: No rashes, lesions or ulcers on exposed skin  Psychiatry: Very poor historian  Data Reviewed: I have personally reviewed following labs and imaging studies  CBC: Recent Labs  Lab 01/28/22 1713 01/29/22 0544 01/29/22 2113 01/30/22 0257 01/30/22 0556 01/31/22 0641  WBC 8.0  --   --  9.8  --  9.8  NEUTROABS 5.4  --   --   --   --   --   HGB 7.0* 5.9* 9.4* 10.0* 9.4* 10.7*  HCT 22.2* 19.4* 28.4* 30.2* 28.4* 33.2*  MCV 103.7*  --   --  94.7  --  98.2  PLT 245  --   --  164  --  129*    Basic Metabolic Panel: Recent Labs  Lab 01/28/22 1713 01/29/22 0544  NA 139 139  K 3.8 3.6  CL 107 112*  CO2 23 21*  GLUCOSE 113* 82  BUN 26* 21  CREATININE 1.18* 0.96  CALCIUM 8.9 8.1*  MG  --  1.8    GFR: Estimated Creatinine Clearance:  31.2 mL/min (by C-G formula based on SCr of 0.96 mg/dL). Liver Function Tests: Recent Labs  Lab 01/28/22 1713  AST 24  ALT 10  ALKPHOS 70  BILITOT 0.6  PROT 6.6  ALBUMIN 2.4*    No results for input(s): "LIPASE", "AMYLASE" in the last 168 hours. No results for input(s): "AMMONIA" in the last 168 hours. Coagulation Profile: Recent Labs  Lab 01/28/22 1713  INR 1.1    Cardiac Enzymes: No results for input(s): "CKTOTAL", "CKMB", "CKMBINDEX", "TROPONINI" in the last 168 hours. BNP (last 3 results) No results for input(s): "PROBNP" in the last 8760 hours. HbA1C: No results for input(s): "HGBA1C" in the last 72 hours. CBG: Recent Labs  Lab 01/28/22 1715  GLUCAP 84    Lipid Profile: No results for input(s): "CHOL", "HDL", "LDLCALC", "TRIG", "CHOLHDL", "LDLDIRECT" in the last 72 hours. Thyroid Function Tests: No results for input(s): "TSH", "T4TOTAL", "FREET4", "T3FREE", "THYROIDAB" in the last 72 hours. Anemia Panel: No results for input(s): "VITAMINB12", "FOLATE", "FERRITIN", "TIBC", "IRON", "RETICCTPCT" in the last 72 hours. Sepsis Labs: No results for input(s): "PROCALCITON", "LATICACIDVEN" in the last 168 hours.  Recent Results (from the past 240 hour(s))  Resp Panel by RT-PCR (Flu A&B, Covid) Anterior Nasal Swab     Status: None   Collection Time: 01/28/22  5:20 PM   Specimen: Anterior Nasal Swab  Result Value Ref Range Status   SARS Coronavirus 2 by RT PCR NEGATIVE NEGATIVE Final    Comment: (NOTE) SARS-CoV-2 target nucleic acids are NOT DETECTED.  The SARS-CoV-2 RNA is generally detectable in upper respiratory specimens during the acute phase of infection. The lowest concentration of SARS-CoV-2 viral copies this assay can detect is 138 copies/mL. A negative result does not preclude SARS-Cov-2 infection and should not be used as the sole basis for treatment or other patient management decisions. A negative result may occur with  improper specimen  collection/handling, submission of specimen other than nasopharyngeal swab, presence of viral mutation(s) within the areas targeted by this assay, and inadequate number of viral copies(<138 copies/mL). A negative result must be combined with clinical observations, patient history, and epidemiological information. The expected result is Negative.  Fact Sheet for Patients:  BloggerCourse.com  Fact Sheet for Healthcare Providers:  SeriousBroker.it  This test is no t yet approved or cleared by the [place] FDA and  has been authorized for detection and/or diagnosis of SARS-CoV-2 by FDA under an Emergency Use Authorization (EUA). This EUA will remain  in effect (meaning this test can be used) for the duration of the COVID-19 declaration under Section 564(b)(1) of the Act, 21 U.S.C.section 360bbb-3(b)(1), unless the authorization is terminated  or revoked sooner.       Influenza A by PCR NEGATIVE NEGATIVE Final   Influenza B by PCR  NEGATIVE NEGATIVE Final    Comment: (NOTE) The Xpert Xpress SARS-CoV-2/FLU/RSV plus assay is intended as an aid in the diagnosis of influenza from Nasopharyngeal swab specimens and should not be used as a sole basis for treatment. Nasal washings and aspirates are unacceptable for Xpert Xpress SARS-CoV-2/FLU/RSV testing.  Fact Sheet for Patients: BloggerCourse.com  Fact Sheet for Healthcare Providers: SeriousBroker.it  This test is not yet approved or cleared by the [place] FDA and has been authorized for detection and/or diagnosis of SARS-CoV-2 by FDA under an Emergency Use Authorization (EUA). This EUA will remain in effect (meaning this test can be used) for the duration of the COVID-19 declaration under Section 564(b)(1) of the Act, 21 U.S.C. section 360bbb-3(b)(1), unless the authorization is terminated or revoked.  Performed at [facility], [address] [place], [place] [address]   Urine Culture     Status: Abnormal   Collection Time: 01/28/22  9:14 PM   Specimen: Urine, Clean Catch  Result Value Ref Range Status   Specimen Description   Final    URINE, CLEAN CATCH Performed at [facility], [address] [place], [place] [address]    Special Requests   Final    NONE Performed at [facility], [address] [place], [place] [address]    Culture 70,000 COLONIES/mL ENTEROCOCCUS FAECALIS (A)  Final   Report Status 01/30/2022 FINAL  Final   Organism ID, Bacteria ENTEROCOCCUS FAECALIS (A)  Final      Susceptibility   Enterococcus faecalis - MIC*    AMPICILLIN <=2 SENSITIVE Sensitive     NITROFURANTOIN <=16 SENSITIVE Sensitive     VANCOMYCIN 1 SENSITIVE Sensitive     * 70,000 COLONIES/mL ENTEROCOCCUS FAECALIS      Radiology Studies: DG Swallowing Func-Speech Pathology  Result Date: 01/31/2022 Table formatting from the original result was not included. Objective Swallowing Evaluation: Type of Study: MBS-Modified Barium Swallow Study  Patient Details Name: Aribelle Mccosh MRN: [id number] Date of Birth: 1945/03/09 Today's Date: 01/31/2022 Time: SLP Start Time (ACUTE ONLY): 0815 -SLP Stop Time (ACUTE ONLY): 0835 SLP Time Calculation (min) (ACUTE ONLY): 20 min Past Medical History: Past Medical History: Diagnosis Date  Hypertension   Stroke [facility])  Past Surgical History: Past Surgical History: Procedure Laterality Date  ABDOMINAL HYSTERECTOMY    BIOPSY  09/11/2020  Procedure: BIOPSY;  Surgeon: Kathi Der, MD;  Location: [facility] ENDOSCOPY;  Service: Gastroenterology;;  BIOPSY  09/13/2020  Procedure: BIOPSY;  Surgeon: Kathi Der, MD;  Location: [facility] ENDOSCOPY;  Service: Gastroenterology;;  BIOPSY  01/29/2021  Procedure: BIOPSY;  Surgeon: Vida Rigger, MD;  Location: [facility] ENDOSCOPY;  Service: Endoscopy;;  COLONOSCOPY N/A 09/13/2020  Procedure: COLONOSCOPY;  Surgeon: Kathi Der,  MD;  Location: [facility] ENDOSCOPY;  Service: Gastroenterology;  Laterality: N/A;  ESOPHAGOGASTRODUODENOSCOPY N/A 09/11/2020  Procedure: ESOPHAGOGASTRODUODENOSCOPY (EGD);  Surgeon: Kathi Der, MD;  Location: [facility] ENDOSCOPY;  Service: Gastroenterology;  Laterality: N/A;  ESOPHAGOGASTRODUODENOSCOPY (EGD) WITH PROPOFOL N/A 01/29/2021  Procedure: ESOPHAGOGASTRODUODENOSCOPY (EGD) WITH PROPOFOL;  Surgeon: Vida Rigger, MD;  Location: [facility] END[facility]COPY;  Service: Endoscopy;  Laterality: N/A;  With dilation as well and will need ultraslim endoscope in the room  IR ANGIO INTRA EXTRACRAN SEL COM CAROTID INNOMINATE BILAT MOD SED  01/07/2022  POLYPECTOMY  09/13/2020  Procedure: POLYPECTOMY;  Surgeon: Kathi Der, MD;  Location: [facility] ENDOSCOPY;  Service: Gastroenterology;;  Gaspar Bidding DILATION N/A 01/29/2021  Procedure: Gaspar Bidding DILATION;  Surgeon: Vida Rigger, MD;  Location: [facility] ENDOSCOPY;  Service: Endoscopy;  Laterality: N/A; HPI: Patient is a 76 y.o. female with PMH: lymphocytic colitis, HTN, recent right shoulder surgery(one month ago), esophageal stricture s/p EGD with dilation. 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 SLE completed on 01/04/2022 and 01/06/2022.  HH SLP services recommended but no acute level SLP recommended.Pt dc'd and then returned to hospital on 11/29 due to concern for GI bleed - she is on Eliquis.  Swallow eval ordered.  GI referral pending.  Pt is receiving blood transfusion at this time.  Prior esophagram showed narrowing - cervical and distally 12/2020 and pt is s/p endoscopy- empirically dilated to 15 mm proximal.  CXR showed tortulous thoracic aorta.  Medication list includes Carafate, Protonix and depakote sprinkles.  CT head showed evolving right posterior parietal CVA.  Pt endorses cough recently - productive to secretions and she eats soft foods at home.  Subjective: pleasant, says she hasnt had breakfast and is hungry  Recommendations for follow up therapy are one component of a  multi-disciplinary discharge planning process, led by the attending physician.  Recommendations may be updated based on patient status, additional functional criteria and insurance authorization. Assessment / Plan / Recommendation   01/31/2022   8:53 AM Clinical Impressions Clinical Impression Patient presents with a mild oropharyngeal dysphagia and suspected esophageal component as well. During oral phase, patient with decreased lingual manipulation of boluses leading to decreased anterior to posterior oral transit, piecemeal swallowing with solids and liquids as well as premature spillage of thin liquids into vallecular sinus. Barium consistencies tested were: nectar thick and thin liquids, puree solids, 45mm barium tablet and mechanical soft solids. During pharyngeal phase, swallow was initiated at level of vallecular sinus with thin and nectar thick liquids as well as puree solids. Trace vallecular residuals remained after initial swallows with thin and nectar thick liquids and mild vallecular residuals remained after initial swallow of mechanical soft solids. Flash penetration occured on approximately 50% of thin liquid sips but penetrate remained above vocal cords and exited laryngeal vestibule. One instance of sensed, trace aspiration of thin liquids occured when patient drinking thin liquid barium while still having some mechanical soft solids bolus in oral cavity. No other instances of aspiration occured, even when patient taking large, sequential straw sips of thin liquids. SLP did observe some slow movement of barium in upper thoracic esophagus as well as distal esophagus (during esophageal sweep) but no barium stasis observed. She did have an instance of congested sounding cough in absence of any penetrate or aspirate. SLP discussed results with patient and she reported she typically ate solids that were very finely cut up. SLP recommending Dys 2 (minced) solids and thin liquids. SLP Visit Diagnosis  Dysphagia, oropharyngeal phase (R13.12);Dysphagia, pharyngoesophageal phase (R13.14) Impact on safety and function Mild aspiration risk     01/31/2022   8:53 AM Treatment Recommendations Treatment Recommendations Therapy as outlined in treatment plan below     01/31/2022   9:01 AM Prognosis Prognosis for Safe Diet Advancement Good   01/31/2022   8:53 AM Diet Recommendations SLP Diet Recommendations Dysphagia 2 (Fine chop) solids;Thin liquid Liquid Administration via Cup;Straw Medication Administration Whole meds with puree Compensations Minimize environmental distractions;Slow rate;Small sips/bites Postural Changes Seated upright at 90 degrees;Remain semi-upright after after feeds/meals (Comment)     01/31/2022   8:53 AM Other Recommendations Oral Care Recommendations Oral care BID Follow Up Recommendations Home health SLP Functional Status Assessment Patient has had a recent decline in their functional status and demonstrates the ability to make significant  improvements in function in a reasonable and predictable amount of time.   01/31/2022   8:53 AM Frequency and Duration  Speech Therapy Frequency (ACUTE ONLY) min 1 x/week Treatment Duration 1 week     01/31/2022   8:48 AM Oral Phase Oral Phase Impaired Oral - Nectar Cup Weak lingual manipulation;Reduced posterior propulsion Oral - Thin Cup Weak lingual manipulation;Reduced posterior propulsion;Premature spillage Oral - Thin Straw Weak lingual manipulation;Premature spillage;Piecemeal swallowing;Reduced posterior propulsion Oral - Puree Weak lingual manipulation;Reduced posterior propulsion Oral - Mech Soft Weak lingual manipulation;Piecemeal swallowing;Reduced posterior propulsion Oral - Pill Reduced posterior propulsion;Weak lingual manipulation    01/31/2022   8:49 AM Pharyngeal Phase Pharyngeal Phase Impaired Pharyngeal- Nectar Cup Delayed swallow initiation-vallecula;Pharyngeal residue - valleculae Pharyngeal- Thin Cup Delayed swallow  initiation-vallecula;Penetration/Aspiration during swallow;Pharyngeal residue - valleculae Pharyngeal Material enters airway, remains ABOVE vocal cords then ejected out Pharyngeal- Thin Straw Delayed swallow initiation-vallecula;Penetration/Aspiration during swallow;Reduced airway/laryngeal closure;Trace aspiration;Pharyngeal residue - valleculae Pharyngeal Material enters airway, remains ABOVE vocal cords then ejected out;Material enters airway, passes BELOW cords and not ejected out despite cough attempt by patient Pharyngeal- Puree Delayed swallow initiation-vallecula Pharyngeal- Mechanical Soft Delayed swallow initiation-vallecula;Pharyngeal residue - valleculae [facility]- [facility]    01/31/2022   8:51 AM Cervical Esophageal Phase  Cervical Esophageal Phase Impaired Cervical Esophageal Comment 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 Preston, MA, CCC-SLP Speech Therapy                        Scheduled Meds:  influenza vaccine adjuvanted  0.5 mL Intramuscular Tomorrow-1000   metoprolol tartrate  12.5 mg Oral BID   pantoprazole  40 mg Oral BID   pneumococcal 20-valent conjugate vaccine  0.5 mL Intramuscular Tomorrow-1000   sucralfate  1 g Oral TID WC & HS   Continuous Infusions:  lacosamide (VIMPAT) IV 100 mg (01/31/22 1044)   valproate sodium Stopped (01/31/22 0614)     LOS: 3 days     Noralee StainJennifer Ahtziry Saathoff, DO [facility] 01/31/2022, 1:04 PM   Available via Epic secure chat 7am-7pm After these hours, please refer to coverage provider listed on [email or web address]

## 2022-01-31 NOTE — Evaluation (Signed)
Occupational Therapy Evaluation Patient Details Name: Wanda Valdez MRN: [id number] DOB: 12-24-1945 Today's Date: 01/31/2022   History of Present Illness Wanda Valdez is a 76 year old female with PMHx of lymphocytic colitis, HTN, recent right shoulder surgery.  She was also recently admitted 01/01/2022 to 01/09/2022.  During that hospitalization she was diagnosed with acute cystitis and acute CVA.  Patient admitted for GI bleed.  Patient underwent EGD 12/1 which revealed nonbleeding gastric ulcer, gastritis, duodenitis, nonbleeding duodenal ulcer, nonbleeding angiodysplastic lesion.   Clinical Impression   Wanda Valdez is a 76 year old woman who presents with decreased ROM and strength of right shoulder, generalized weakness, decreased activity tolerance, poor vision, impaired balance, impaired coordination and cognitive deficits. She presents worse now than she did 4 weeks ago after her stroke - needing increased assistance for both mobility and ADLs. Patient not a reliable historian so unsure of her recent PLOF. She thinks she is in [employer]. Per chart review patient went home with [facility] and daughter, was using a rollator with therapy in acute care, and ambulated 200 ft with mobility tech. Family reporting she has had difficulty walking, memory deficits and incontinence since being home per ER note. Today patient mod assist for transfer to side of bed, min assist to stand and needing min physical assist and verbal cues to take step to recliner. She needs max-total assist for ADLs. Patient exhibited difficulty with feeding herself - limited by poor shoulder Rom and bradyikinetic movement bilaterally. She exhibited some coordination/in hand manipulation deficits of right  dominant hand with attempts at self feeding. Patient reports poor vision and appears to have some left peripheral vision deficits. She also reports everything is blurry. Patient will benefit from skilled OT services  while in hospital to improve deficits and learn compensatory strategies as needed in order to return to improve functional abilities. Patient was doing well after initial stroke in hospital. Recommend aggressive short term rehab at discharge prior to return home.      Recommendations for follow up therapy are one component of a multi-disciplinary discharge planning process, led by the attending physician.  Recommendations may be updated based on patient status, additional functional criteria and insurance authorization.   Follow Up Recommendations  Acute inpatient rehab (3hours/day)     Assistance Recommended at Discharge Frequent or constant Supervision/Assistance  Patient can return home with the following A lot of help with walking and/or transfers;A lot of help with bathing/dressing/bathroom;Assistance with cooking/housework;Direct supervision/assist for financial management;Direct supervision/assist for medications management;Help with stairs or ramp for entrance;Assistance with feeding    Functional Status Assessment  Patient has had a recent decline in their functional status and/or demonstrates limited ability to make significant improvements in function in a reasonable and predictable amount of time  Equipment Recommendations  None recommended by OT    Recommendations for Other Services Rehab consult     Precautions / Restrictions Precautions Precautions: Fall;Shoulder Type of Shoulder Precautions: recent R shoulder surgery, no sling and unable to locate any precautions/restrictions. Pt states surgery was at [facility] in August 2023, Left visual deficits Restrictions Weight Bearing Restrictions: No Other Position/Activity Restrictions: Uncertain at this time, recent shoulder surgery in [facility] August or Oct 2023??      Mobility Bed Mobility Overal bed mobility: Needs Assistance Bed Mobility: Supine to Sit     Supine to sit: Mod assist, HOB elevated     General bed mobility  comments: Mod assist to transfer to side of bed for LEs, hips and  trunk    Transfers Overall transfer level: Needs assistance Equipment used: Rolling walker (2 wheels)   Sit to Stand: Min assist Stand pivot transfers: Min assist         General transfer comment: Min assist to stand and take steps - with assistane for walker and verbal cues to get to recliner.      Balance Overall balance assessment: No apparent balance deficits (not formally assessed) Sitting-balance support: No upper extremity supported, Feet supported Sitting balance-Leahy Scale: Fair     Standing balance support: During functional activity, Reliant on assistive device for balance Standing balance-Leahy Scale: Poor                             ADL either performed or assessed with clinical judgement   ADL Overall ADL's : Needs assistance/impaired Eating/Feeding: Maximal assistance;Sitting Eating/Feeding Details (indicate cue type and reason): Transferred to recliner. Max assist for feeding. Exhibited difficulty reaching/manipulating utensil. Eventually with right elbow propped and holding ice cream container could spoon herself ice cream. Difficulty reaching plate due to poor shoulder ROM. Grooming: Wash/dry face;Sitting   Upper Body Bathing: Maximal assistance;Sitting   Lower Body Bathing: Maximal assistance;Sit to/from stand   Upper Body Dressing : Maximal assistance;Sitting   Lower Body Dressing: Maximal assistance;Sit to/from stand   Toilet Transfer: Minimal assistance;Stand-pivot;BSC/3in1   Toileting- Clothing Manipulation and Hygiene: Total assistance;Sit to/from stand Toileting - Clothing Manipulation Details (indicate cue type and reason): incontinent - total care     Functional mobility during ADLs: Minimal assistance General ADL Comments: vision and deficits post-op RUE impacting independence in ADL and mobility, as well as generalized weakness and decreased activity tolerance      Vision   Vision Assessment?: Vision impaired- to be further tested in functional context;Yes Additional Comments: Appears to have left sided visual deficits. Nor formally assessed.     Perception     Praxis      Pertinent Vitals/Pain Pain Assessment Pain Assessment: No/denies pain     Hand Dominance Right   Extremity/Trunk Assessment Upper Extremity Assessment Upper Extremity Assessment: RUE deficits/detail;LUE deficits/detail RUE Deficits / Details: R shoulder surgery, uncertain of timeline of surgery and if any current limitations and restrictions. Approx 30 shoulder flex/abduction, functional elbow ROM, wrist lacking ROM/tight with passive ROM, grossly functional finger ROM. RUE Coordination: decreased fine motor;decreased gross motor (Bradykinetic and decreased in hand manipulation.) LUE Deficits / Details: WFL ROM, grossly 3+/5 strength LUE Coordination:  (functional coordination but bradykinetic)   Lower Extremity Assessment Lower Extremity Assessment: Defer to PT evaluation   Cervical / Trunk Assessment Cervical / Trunk Assessment: Kyphotic   Communication Communication Communication: No difficulties   Cognition Arousal/Alertness: Awake/alert Behavior During Therapy: WFL for tasks assessed/performed Overall Cognitive Status: No family/caregiver present to determine baseline cognitive functioning                                 General Comments: Alert to self, birthday age. Thinks she is in [facility].     General Comments       Exercises     Shoulder Instructions      Home Living Family/patient expects to be discharged to:: Skilled nursing facility Living Arrangements: Children Available Help at Discharge: Family Type of Home: House       Home Layout: Laundry or work area in basement;Able to live on main level with bedroom/bathroom  Bathroom Shower/Tub: Chief Strategy Officer: Standard     Home  Equipment: [employer] - single point   Additional Comments: Above gleaned form chart review; will need verification  Lives With: Daughter    Prior Functioning/Environment Prior Level of Function : Needs assist             Mobility Comments: Prior to stroke (11/4) - patient ambulated with cane - using rollator now ADLs Comments: Prior to stroke- states that her daughter has been helping her with dressing ony. Unsure but suspect a lot of assistance        OT Problem List: Decreased activity tolerance;Decreased strength;Decreased knowledge of use of DME or AE;Impaired UE functional use;Decreased safety awareness;Decreased coordination;Impaired balance (sitting and/or standing);Decreased range of motion;Decreased cognition      OT Treatment/Interventions: Self-care/ADL training;Balance training;Therapeutic exercise;DME and/or AE instruction;Therapeutic activities;Patient/family education;Neuromuscular education    OT Goals(Current goals can be found in the care plan section) Acute Rehab OT Goals Patient Stated Goal: did not state OT Goal Formulation: Patient unable to participate in goal setting Time For Goal Achievement: 02/14/22 Potential to Achieve Goals: Fair  OT Frequency: Min 2X/week    Co-evaluation              AM-PAC OT "6 Clicks" Daily Activity     Outcome Measure Help from another person eating meals?: A Lot Help from another person taking care of personal grooming?: A Lot Help from another person toileting, which includes using toliet, bedpan, or urinal?: Total Help from another person bathing (including washing, rinsing, drying)?: A Lot Help from another person to put on and taking off regular upper body clothing?: A Lot Help from another person to put on and taking off regular lower body clothing?: Total 6 Click Score: 10   End of Session Equipment Utilized During Treatment: Gait belt;Rolling walker (2 wheels) Nurse Communication: Mobility status (needs feeding  assist)  Activity Tolerance: Patient tolerated treatment well Patient left: in chair;with chair alarm set;with call bell/phone within reach  OT Visit Diagnosis: Unsteadiness on feet (R26.81);Other abnormalities of gait and mobility (R26.89);Muscle weakness (generalized) (M62.81)                Time: 1327-1400 OT Time Calculation (min): 33 min Charges:  OT General Charges $OT Visit: 1 Visit OT Evaluation $OT Eval Low Complexity: 1 Low OT Treatments $Self Care/Home Management : 8-22 mins  Donnella Sham, OTR/L Acute Care Rehab Services  Office [phone]   Kelli Churn 01/31/2022, 4:01 PM

## 2022-01-31 NOTE — Progress Notes (Signed)
Modified Barium Swallow Progress Note  Patient Details  Name: Wanda Valdez MRN: [id number] Date of Birth: 1945/09/01  Today's Date: 01/31/2022  Modified Barium Swallow completed.  Full report located under Chart Review in the Imaging Section.  Brief recommendations include the following:  Clinical Impression  Patient presents with a mild oropharyngeal dysphagia and suspected esophageal component as well. During oral phase, patient with decreased lingual manipulation of boluses leading to decreased anterior to posterior oral transit, piecemeal swallowing with solids and liquids as well as premature spillage of thin liquids into vallecular sinus. Barium consistencies tested were: nectar thick and thin liquids, puree solids, 82mm barium tablet and mechanical soft solids. During pharyngeal phase, swallow was initiated at level of vallecular sinus with thin and nectar thick liquids as well as puree solids. Trace vallecular residuals remained after initial swallows with thin and nectar thick liquids and mild vallecular residuals remained after initial swallow of mechanical soft solids. Flash penetration occured on approximately 50% of thin liquid sips but penetrate remained above vocal cords and exited laryngeal vestibule. One instance of sensed, trace aspiration of thin liquids occured when patient drinking thin liquid barium while still having some mechanical soft solids bolus in oral cavity. No other instances of aspiration occured, even when patient taking large, sequential straw sips of thin liquids. SLP did observe some slow movement of barium in upper thoracic esophagus as well as distal esophagus (during esophageal sweep) but no barium stasis observed. She did have an instance of congested sounding cough in absence of any penetrate or aspirate. SLP discussed results with patient and she reported she typically ate solids that were very finely cut up. SLP recommending Dys 2 (minced) solids and thin  liquids.   Swallow Evaluation Recommendations       SLP Diet Recommendations: Dysphagia 2 (Fine chop) solids;Thin liquid   Liquid Administration via: Cup;Straw   Medication Administration: Whole meds with puree   Supervision: Patient able to self feed;Staff to assist with self feeding   Compensations: Minimize environmental distractions;Slow rate;Small sips/bites   Postural Changes: Seated upright at 90 degrees;Remain semi-upright after after feeds/meals (Comment) (30 minutes after meals)   Oral Care Recommendations: Oral care BID        Angela Nevin, MA, CCC-SLP Speech Therapy

## 2022-02-01 DIAGNOSIS — K921 Melena: Secondary | ICD-10-CM | POA: Diagnosis not present

## 2022-02-01 LAB — CBC
HCT: 31.4 % — ABNORMAL LOW (ref 36.0–46.0)
Hemoglobin: 10.4 g/dL — ABNORMAL LOW (ref 12.0–15.0)
MCH: 31.4 pg (ref 26.0–34.0)
MCHC: 33.1 g/dL (ref 30.0–36.0)
MCV: 94.9 fL (ref 80.0–100.0)
Platelets: 120 10*3/uL — ABNORMAL LOW (ref 150–400)
RBC: 3.31 MIL/uL — ABNORMAL LOW (ref 3.87–5.11)
RDW: 17.9 % — ABNORMAL HIGH (ref 11.5–15.5)
WBC: 11.3 10*3/uL — ABNORMAL HIGH (ref 4.0–10.5)
nRBC: 0 % (ref 0.0–0.2)

## 2022-02-01 MED ORDER — ADULT MULTIVITAMIN W/MINERALS CH
1.0000 | ORAL_TABLET | Freq: Every day | ORAL | Status: DC
  Administered 2022-02-01 – 2022-02-06 (×6): 1 via ORAL
  Filled 2022-02-01 (×6): qty 1

## 2022-02-01 MED ORDER — HYDROCHLOROTHIAZIDE 12.5 MG PO TABS
12.5000 mg | ORAL_TABLET | Freq: Every day | ORAL | Status: DC
  Administered 2022-02-01: 12.5 mg via ORAL
  Filled 2022-02-01: qty 1

## 2022-02-01 MED ORDER — ENSURE ENLIVE PO LIQD
237.0000 mL | Freq: Two times a day (BID) | ORAL | Status: DC
  Administered 2022-02-02 – 2022-02-15 (×10): 237 mL via ORAL

## 2022-02-01 MED ORDER — ASPIRIN 81 MG PO TBEC
81.0000 mg | DELAYED_RELEASE_TABLET | Freq: Every day | ORAL | Status: DC
  Administered 2022-02-01 – 2022-02-06 (×6): 81 mg via ORAL
  Filled 2022-02-01 (×6): qty 1

## 2022-02-01 NOTE — Progress Notes (Signed)
PROGRESS NOTE    Latrece Nitta  RUE:[id number] DOB: Feb 03, 1946 DOA: 01/28/2022 PCP: Raquel James, MD     Brief Narrative:  Torianne Laflam is a 76 year old female with PMHx of lymphocytic colitis, HTN, recent right shoulder surgery.  She was also recently admitted 01/01/2022 to 01/09/2022.  During that hospitalization she was diagnosed with acute cystitis and acute CVA, started on aspirin and Plavix (for 3 weeks, then aspirin alone).  She also had a EEG that showed evidence of seizures.  She was discharged home with home health.  Since discharge, patient states she is continue to feel ill.   Labs revealed anemia with hemoglobin of 7, heme positive stool.  Patient was referred for admission and GI consulted.  Patient underwent EGD 12/1 which revealed nonbleeding gastric ulcer, gastritis, duodenitis, nonbleeding duodenal ulcer, nonbleeding angiodysplastic lesion.  She was recommended for Protonix 40 mg twice daily for 2 months.  New events last 24 hours / Subjective: Worked with physical therapy this morning, recommendation for CIR.  Patient states that after her initial discharge from the hospital for stroke, she has continued to decline in her mobility and strength  Assessment & Plan:   Principal Problem:   GI bleeding Active Problems:   Dysphagia   Protein-calorie malnutrition, severe   Current chronic use of systemic steroids   Seizures (HCC)   Cerebrovascular accident (CVA) due to embolism of right middle cerebral artery (HCC)   Anemia due to blood loss, acute   Upper GI bleed   GI bleeding, symptomatic anemia, acute blood loss anemia -Transfused 2 unit packed red blood cells  -S/p EGD 12/1 which revealed nonbleeding gastric ulcer, gastritis, duodenitis, nonbleeding duodenal ulcer, nonbleeding angiodysplastic lesion -Aspirin and Plavix on hold.  Seems to have completed dual antiplatelet therapy for 3 weeks.  She should be on aspirin.  Discussed with Dr. Ewing Schlein 12/2. Resume  aspirin today  -PPI -Hemoglobin remains stable  History of CVA -Aspirin  Seizure disorder -Continue Depakote, Vimpat  Dysphagia -Status post MBS.  Now on dysphagia 2 diet  HTN -Lopressor, HCTZ    DVT prophylaxis:  SCDs Start: 01/29/22 0215  Code Status: Full Family Communication: None at bedside, discussed with daughter over the phone  Disposition Plan:  Status is: Inpatient Remains inpatient appropriate because: CIR recommended   Antimicrobials:  Anti-infectives (From admission, onward)    None        Objective: Vitals:   01/31/22 0548 01/31/22 1517 01/31/22 2118 02/01/22 0642  BP: (!) 179/78 125/86 (!) 148/97 (!) 184/83  Pulse: 72 91 73 70  Resp: 14 20 17 17   Temp: (!) 97.5 F (36.4 C) 97.8 F (36.6 C) 98.5 F (36.9 C) 98.4 F (36.9 C)  TempSrc: Oral Oral Oral Oral  SpO2: 100% 97% 99% 98%  Weight:      Height:        Intake/Output Summary (Last 24 hours) at 02/01/2022 1054 Last data filed at 02/01/2022 0900 Gross per 24 hour  Intake 220 ml  Output 450 ml  Net -230 ml    Filed Weights   01/28/22 1708  Weight: 41.6 kg    Examination:  General exam: Appears calm and comfortable, frail-appearing Respiratory system: Clear to auscultation. Respiratory effort normal.  Cardiovascular system: S1 & S2 heard, RRR. No murmurs. No pedal edema. Gastrointestinal system: Abdomen is nondistended, soft and nontender. Normal bowel sounds heard. Central nervous system: Alert Extremities: Symmetric in appearance  Skin: No rashes, lesions or ulcers on exposed skin  Psychiatry: Mood stable  Data Reviewed: I have personally reviewed following labs and imaging studies  CBC: Recent Labs  Lab 01/28/22 1713 01/29/22 0544 01/29/22 2113 01/30/22 0257 01/30/22 0556 01/31/22 0641 02/01/22 0540  WBC 8.0  --   --  9.8  --  9.8 11.3*  NEUTROABS 5.4  --   --   --   --   --   --   HGB 7.0*   < > 9.4* 10.0* 9.4* 10.7* 10.4*  HCT 22.2*   < > 28.4* 30.2* 28.4* 33.2*  31.4*  MCV 103.7*  --   --  94.7  --  98.2 94.9  PLT 245  --   --  164  --  129* 120*   < > = values in this interval not displayed.    Basic Metabolic Panel: Recent Labs  Lab 01/28/22 1713 01/29/22 0544  NA 139 139  K 3.8 3.6  CL 107 112*  CO2 23 21*  GLUCOSE 113* 82  BUN 26* 21  CREATININE 1.18* 0.96  CALCIUM 8.9 8.1*  MG  --  1.8    GFR: Estimated Creatinine Clearance: 31.2 mL/min (by C-G formula based on SCr of 0.96 mg/dL). Liver Function Tests: Recent Labs  Lab 01/28/22 1713  AST 24  ALT 10  ALKPHOS 70  BILITOT 0.6  PROT 6.6  ALBUMIN 2.4*    No results for input(s): "LIPASE", "AMYLASE" in the last 168 hours. No results for input(s): "AMMONIA" in the last 168 hours. Coagulation Profile: Recent Labs  Lab 01/28/22 1713  INR 1.1    Cardiac Enzymes: No results for input(s): "CKTOTAL", "CKMB", "CKMBINDEX", "TROPONINI" in the last 168 hours. BNP (last 3 results) No results for input(s): "PROBNP" in the last 8760 hours. HbA1C: No results for input(s): "HGBA1C" in the last 72 hours. CBG: Recent Labs  Lab 01/28/22 1715  GLUCAP 84    Lipid Profile: No results for input(s): "CHOL", "HDL", "LDLCALC", "TRIG", "CHOLHDL", "LDLDIRECT" in the last 72 hours. Thyroid Function Tests: No results for input(s): "TSH", "T4TOTAL", "FREET4", "T3FREE", "THYROIDAB" in the last 72 hours. Anemia Panel: No results for input(s): "VITAMINB12", "FOLATE", "FERRITIN", "TIBC", "IRON", "RETICCTPCT" in the last 72 hours. Sepsis Labs: No results for input(s): "PROCALCITON", "LATICACIDVEN" in the last 168 hours.  Recent Results (from the past 240 hour(s))  Resp Panel by RT-PCR (Flu A&B, Covid) Anterior Nasal Swab     Status: None   Collection Time: 01/28/22  5:20 PM   Specimen: Anterior Nasal Swab  Result Value Ref Range Status   SARS Coronavirus 2 by RT PCR NEGATIVE NEGATIVE Final    Comment: (NOTE) SARS-CoV-2 target nucleic acids are NOT DETECTED.  The SARS-CoV-2 RNA is  generally detectable in upper respiratory specimens during the acute phase of infection. The lowest concentration of SARS-CoV-2 viral copies this assay can detect is 138 copies/mL. A negative result does not preclude SARS-Cov-2 infection and should not be used as the sole basis for treatment or other patient management decisions. A negative result may occur with  improper specimen collection/handling, submission of specimen other than nasopharyngeal swab, presence of viral mutation(s) within the areas targeted by this assay, and inadequate number of viral copies(<138 copies/mL). A negative result must be combined with clinical observations, patient history, and epidemiological information. The expected result is Negative.  Fact Sheet for Patients:  BloggerCourse.com  Fact Sheet for Healthcare Providers:  SeriousBroker.it  This test is no t yet approved or cleared by the [place] FDA and  has been authorized for detection  and/or diagnosis of SARS-CoV-2 by FDA under an Emergency Use Authorization (EUA). This EUA will remain  in effect (meaning this test can be used) for the duration of the COVID-19 declaration under Section 564(b)(1) of the Act, 21 U.S.C.section 360bbb-3(b)(1), unless the authorization is terminated  or revoked sooner.       Influenza A by PCR NEGATIVE NEGATIVE Final   Influenza B by PCR NEGATIVE NEGATIVE Final    Comment: (NOTE) The Xpert Xpress SARS-CoV-2/FLU/RSV plus assay is intended as an aid in the diagnosis of influenza from Nasopharyngeal swab specimens and should not be used as a sole basis for treatment. Nasal washings and aspirates are unacceptable for Xpert Xpress SARS-CoV-2/FLU/RSV testing.  Fact Sheet for Patients: BloggerCourse.com  Fact Sheet for Healthcare Providers: SeriousBroker.it  This test is not yet approved or cleared by the [place] FDA and has been authorized for detection and/or diagnosis of SARS-CoV-2 by FDA under an Emergency Use Authorization (EUA). This EUA will remain in effect (meaning this test can be used) for the duration of the COVID-19 declaration under Section 564(b)(1) of the Act, 21 U.S.C. section 360bbb-3(b)(1), unless the authorization is terminated or revoked.  Performed at [facility], [address], [place], [place] [address]   Urine Culture     Status: Abnormal   Collection Time: 01/28/22  9:14 PM   Specimen: Urine, Clean Catch  Result Value Ref Range Status   Specimen Description   Final    URINE, CLEAN CATCH Performed at [facility], [address] [place], [place] [address]    Special Requests   Final    NONE Performed at [facility], [address] [place], [place] [address]    Culture 70,000 COLONIES/mL ENTEROCOCCUS FAECALIS (A)  Final   Report Status 01/30/2022 FINAL  Final   Organism ID, Bacteria ENTEROCOCCUS FAECALIS (A)  Final      Susceptibility   Enterococcus faecalis - MIC*    AMPICILLIN <=2 SENSITIVE Sensitive     NITROFURANTOIN <=16 SENSITIVE Sensitive     VANCOMYCIN 1 SENSITIVE Sensitive     * 70,000 COLONIES/mL ENTEROCOCCUS FAECALIS      Radiology Studies: DG Swallowing Func-Speech Pathology  Result Date: 01/31/2022 Table formatting from the original result was not included. Objective Swallowing Evaluation: Type of Study: MBS-Modified Barium Swallow Study  Patient Details Name: Armetta Henri MRN: [id number] Date of Birth: 16-Jan-1946 Today's Date: 01/31/2022 Time: SLP Start Time (ACUTE ONLY): 0815 -SLP Stop Time (ACUTE ONLY): 0835 SLP Time Calculation (min) (ACUTE ONLY): 20 min Past Medical History: Past Medical History: Diagnosis Date  Hypertension   Stroke [facility])  Past Surgical History: Past Surgical History: Procedure Laterality Date  ABDOMINAL HYSTERECTOMY    BIOPSY  09/11/2020  Procedure: BIOPSY;  Surgeon: Kathi Der, MD;  Location: [facility] ENDOSCOPY;  Service: Gastroenterology;;  BIOPSY  09/13/2020  Procedure: BIOPSY;  Surgeon: Kathi Der, MD;  Location: [facility] ENDOSCOPY;  Service: Gastroenterology;;  BIOPSY  01/29/2021  Procedure: BIOPSY;  Surgeon: Vida Rigger, MD;  Location: [facility] ENDOSCOPY;  Service: Endoscopy;;  COLONOSCOPY N/A 09/13/2020  Procedure: COLONOSCOPY;  Surgeon: Kathi Der, MD;  Location: [facility] ENDOSCOPY;  Service: Gastroenterology;  Laterality: N/A;  ESOPHAGOGASTRODUODENOSCOPY N/A 09/11/2020  Procedure: ESOPHAGOGASTRODUODENOSCOPY (EGD);  Surgeon: Kathi Der, MD;  Location: [facility];  Service: Gastroenterology;  Laterality: N/A;  ESOPHAGOGASTRODUODENOSCOPY (EGD) WITH PROPOFOL N/A 01/29/2021  Procedure: ESOPHAGOGASTRODUODENOSCOPY (EGD) WITH PROPOFOL;  Surgeon: Vida Rigger, MD;  Location: [facility] ENDOSCOPY;  Service: Endoscopy;  Laterality: N/A;  With dilation as well and will need ultraslim endoscope in the room  IR ANGIO INTRA EXTRACRAN SEL COM CAROTID INNOMINATE BILAT MOD SED  01/07/2022  POLYPECTOMY  09/13/2020  Procedure: POLYPECTOMY;  Surgeon: Kathi DerBrahmbhatt, Parag, MD;  [facility];  Service: Gastroenterology;;  Gaspar BiddingSAVORY DILATION N/A 01/29/2021  Procedure: Gaspar BiddingSAVORY DILATION;  Surgeon: Vida RiggerMagod, Marc, MD;  [facility];  Service: Endoscopy;  Laterality: N/A; HPI: Patient is a 76 y.o. female with PMH: lymphocytic colitis, HTN, recent right shoulder surgery(one month ago), esophageal stricture s/p EGD with dilation. 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 SLE completed on 01/04/2022 and 01/06/2022.  HH SLP services recommended but no acute level SLP recommended.Pt dc'd and then returned to hospital on 11/29 due to concern for GI bleed - she is on Eliquis.  Swallow eval ordered.  GI referral pending.  Pt is receiving blood transfusion at this time.  Prior esophagram showed narrowing - cervical and distally 12/2020 and pt is s/p endoscopy- empirically dilated to 15 mm proximal.  CXR  showed tortulous thoracic aorta.  Medication list includes Carafate, Protonix and depakote sprinkles.  CT head showed evolving right posterior parietal CVA.  Pt endorses cough recently - productive to secretions and she eats soft foods at home.  Subjective: pleasant, says she hasnt had breakfast and is hungry  Recommendations for follow up therapy are one component of a multi-disciplinary discharge planning process, led by the attending physician.  Recommendations may be updated based on patient status, additional functional criteria and insurance authorization. Assessment / Plan / Recommendation   01/31/2022   8:53 AM Clinical Impressions Clinical Impression Patient presents with a mild oropharyngeal dysphagia and suspected esophageal component as well. During oral phase, patient with decreased lingual manipulation of boluses leading to decreased anterior to posterior oral transit, piecemeal swallowing with solids and liquids as well as premature spillage of thin liquids into vallecular sinus. Barium consistencies tested were: nectar thick and thin liquids, puree solids, 13mm barium tablet and mechanical soft solids. During pharyngeal phase, swallow was initiated at level of vallecular sinus with thin and nectar thick liquids as well as puree solids. Trace vallecular residuals remained after initial swallows with thin and nectar thick liquids and mild vallecular residuals remained after initial swallow of mechanical soft solids. Flash penetration occured on approximately 50% of thin liquid sips but penetrate remained above vocal cords and exited laryngeal vestibule. One instance of sensed, trace aspiration of thin liquids occured when patient drinking thin liquid barium while still having some mechanical soft solids bolus in oral cavity. No other instances of aspiration occured, even when patient taking large, sequential straw sips of thin liquids. SLP did observe some slow movement of barium in upper thoracic  esophagus as well as distal esophagus (during esophageal sweep) but no barium stasis observed. She did have an instance of congested sounding cough in absence of any penetrate or aspirate. SLP discussed results with patient and she reported she typically ate solids that were very finely cut up. SLP recommending Dys 2 (minced) solids and thin liquids. SLP Visit Diagnosis Dysphagia, oropharyngeal phase (R13.12);Dysphagia, pharyngoesophageal phase (R13.14) Impact on safety and function Mild aspiration risk     01/31/2022   8:53 AM Treatment Recommendations Treatment Recommendations Therapy as outlined in treatment plan below     01/31/2022   9:01 AM Prognosis Prognosis for Safe Diet Advancement Good   01/31/2022   8:53 AM Diet Recommendations SLP Diet Recommendations Dysphagia 2 (Fine chop) solids;Thin liquid Liquid  Administration via Cup;Straw Medication Administration Whole meds with puree Compensations Minimize environmental distractions;Slow rate;Small sips/bites Postural Changes Seated upright at 90 degrees;Remain semi-upright after after feeds/meals (Comment)     01/31/2022   8:53 AM Other Recommendations Oral Care Recommendations Oral care BID Follow Up Recommendations Home health SLP Functional Status Assessment Patient has had a recent decline in their functional status and demonstrates the ability to make significant improvements in function in a reasonable and predictable amount of time.   01/31/2022   8:53 AM Frequency and Duration  Speech Therapy Frequency (ACUTE ONLY) min 1 x/week Treatment Duration 1 week     01/31/2022   8:48 AM Oral Phase Oral Phase Impaired Oral - Nectar Cup Weak lingual manipulation;Reduced posterior propulsion Oral - Thin Cup Weak lingual manipulation;Reduced posterior propulsion;Premature spillage Oral - Thin Straw Weak lingual manipulation;Premature spillage;Piecemeal swallowing;Reduced posterior propulsion Oral - Puree Weak lingual manipulation;Reduced posterior propulsion Oral - Mech  Soft Weak lingual manipulation;Piecemeal swallowing;Reduced posterior propulsion Oral - Pill Reduced posterior propulsion;Weak lingual manipulation    01/31/2022   8:49 AM Pharyngeal Phase Pharyngeal Phase Impaired Pharyngeal- Nectar Cup Delayed swallow initiation-vallecula;Pharyngeal residue - valleculae Pharyngeal- Thin Cup Delayed swallow initiation-vallecula;Penetration/Aspiration during swallow;Pharyngeal residue - valleculae Pharyngeal Material enters airway, remains ABOVE vocal cords then ejected out Pharyngeal- Thin Straw Delayed swallow initiation-vallecula;Penetration/Aspiration during swallow;Reduced airway/laryngeal closure;Trace aspiration;Pharyngeal residue - valleculae Pharyngeal Material enters airway, remains ABOVE vocal cords then ejected out;Material enters airway, passes BELOW cords and not ejected out despite cough attempt by patient Pharyngeal- Puree Delayed swallow initiation-vallecula Pharyngeal- Mechanical Soft Delayed swallow initiation-vallecula;Pharyngeal residue - valleculae Pharyngeal- Pill [facility]    01/31/2022   8:51 AM Cervical Esophageal Phase  Cervical Esophageal Phase Impaired Cervical Esophageal Comment 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 but did not significantly impede barium transit Angela Nevin, MA, CCC-SLP Speech Therapy                        Scheduled Meds:  aspirin EC  81 mg Oral Daily   atorvastatin  20 mg Oral Daily   divalproex  500 mg Oral Q8H   feeding supplement  237 mL Oral BID BM   influenza vaccine adjuvanted  0.5 mL Intramuscular Tomorrow-1000   lacosamide  100 mg Oral BID   metoprolol tartrate  12.5 mg Oral BID   multivitamin with minerals  1 tablet Oral Daily   pantoprazole  40 mg Oral BID   pneumococcal 20-valent conjugate vaccine  0.5 mL Intramuscular Tomorrow-1000   sucralfate  1 g Oral TID WC & HS   Continuous Infusions:     LOS: 4 days     Noralee Stain, DO [facility] 02/01/2022, 10:54 AM   Available via Epic secure  chat 7am-7pm After these hours, please refer to coverage provider listed on [email or web address]

## 2022-02-01 NOTE — TOC Progression Note (Signed)
**Note De-Identified Wanda Obfuscation** Transition of Care [facility]) - Progression Note    Patient Details  Name: Wanda Valdez MRN: [id number] Date of Birth: 05-Aug-1945  Transition of [facility]) CM/SW Contact  Adrian Prows, RN Phone Number: 02/01/2022, 10:26 AM  Clinical Narrative:    PT recc acute IP rehab; CIR to screen pt; TOC will con't to follow.        Expected Discharge Plan and Services                                                 Social Determinants of Health (SDOH) Interventions    Readmission Risk Interventions     No data to display

## 2022-02-01 NOTE — Progress Notes (Signed)
Initial Nutrition Assessment  DOCUMENTATION CODES:   Not applicable  INTERVENTION:  - Add Ensure Enlive po BID, each supplement provides 350 kcal and 20 grams of protein.   - Add MVI q day.   NUTRITION DIAGNOSIS:   Inadequate oral intake related to poor appetite, dysphagia, lethargy/confusion as evidenced by meal completion < 25%.  GOAL:   Patient will meet greater than or equal to 90% of their needs  MONITOR:   PO intake, Supplement acceptance  REASON FOR ASSESSMENT:   Malnutrition Screening Tool    ASSESSMENT:   76 y.o. female admits related to weakness. PMH includes: lymphocytic colitis, HTN, and recent right shoulder surgery. Pt is currently receiving medical management related to GI bleeding.  Meds reviewed. Labs reviewed.   Pt is currently only oriented x 1. Per record, pt has not been eating her meals. RD will add Ensure BID for now. Will continue to monitor PO intakes.   Wts stable per record. Will also attempt to gather nutrition hx details at follow up if orientation improves.  NUTRITION - FOCUSED PHYSICAL EXAM:  Unable to assess due to remote assessment. Will attempt at follow up.   Diet Order:   Diet Order             DIET DYS 2 Room service appropriate? Yes with Assist; Fluid consistency: Thin  Diet effective now                   EDUCATION NEEDS:   Not appropriate for education at this time  Skin:  Skin Assessment: Skin Integrity Issues: Skin Integrity Issues:: Incisions Incisions: right groin  Last BM:  01/31/22  Height:   Ht Readings from Last 1 Encounters:  01/28/22 4\' 10"  (1.473 m)    Weight:   Wt Readings from Last 1 Encounters:  01/28/22 41.6 kg    Ideal Body Weight:     BMI:  Body mass index is 19.17 kg/m.  Estimated Nutritional Needs:   Kcal:  1245-1455 kcals  Protein:  60-75 gm  Fluid:  >/= 1.2 L  01/30/22, RD, LDN, CNSC.

## 2022-02-01 NOTE — Evaluation (Signed)
Physical Therapy Evaluation Patient Details Name: Wanda Valdez MRN: [id number] DOB: Dec 17, 1945 Today's Date: 02/01/2022  History of Present Illness  Wanda Valdez is a 76 year old female with PMHx of lymphocytic colitis, HTN, recent right shoulder surgery.  She was also recently admitted 01/01/2022 to 01/09/2022.  During that hospitalization she was diagnosed with acute cystitis and acute CVA.  Patient admitted for GI bleed.  Patient underwent EGD 12/1 which revealed nonbleeding gastric ulcer, gastritis, duodenitis, nonbleeding duodenal ulcer, nonbleeding angiodysplastic lesion.  Clinical Impression  Pt admitted with above diagnosis.  Pt currently with functional limitations due to the deficits listed below (see PT Problem List). Pt will benefit from skilled PT to increase their independence and safety with mobility to allow discharge to the venue listed below.  Pt has had a significant decline in her mobility compared to previous notes from when she had her stroke a few weeks ago.  Recommend CIR to intensive rehab.        Recommendations for follow up therapy are one component of a multi-disciplinary discharge planning process, led by the attending physician.  Recommendations may be updated based on patient status, additional functional criteria and insurance authorization.  Follow Up Recommendations Acute inpatient rehab (3hours/day)      Assistance Recommended at Discharge Frequent or constant Supervision/Assistance  Patient can return home with the following  A lot of help with walking and/or transfers    Equipment Recommendations None recommended by PT  Recommendations for Other Services       Functional Status Assessment Patient has had a recent decline in their functional status and demonstrates the ability to make significant improvements in function in a reasonable and predictable amount of time.     Precautions / Restrictions Precautions Precautions:  Fall;Shoulder Type of Shoulder Precautions: recent R shoulder surgery, no sling and unable to locate any precautions/restrictions. Pt states surgery was at [facility] in August 2023, Left visual deficits Restrictions Weight Bearing Restrictions: No Other Position/Activity Restrictions: Uncertain at this time, recent shoulder surgery in [facility] August or Oct 2023??      Mobility  Bed Mobility Overal bed mobility: Needs Assistance Bed Mobility: Supine to Sit     Supine to sit: Mod assist     General bed mobility comments: Initally pt trying to sit straight up in bed, but unable to coordinate movement to move legs/trunk to EOB    Transfers Overall transfer level: Needs assistance Equipment used: Rolling walker (2 wheels) Transfers: Sit to/from Stand Sit to Stand: Min assist   Step pivot transfers: Mod assist       General transfer comment: Slow small steps with RW and needed MOD to guide RW and turn body to recliner.    Ambulation/Gait               General Gait Details: step pivot to recliner only- pt reported some dizziness in standing which subsided when she sat down.  Stairs            Wheelchair Mobility    Modified Rankin (Stroke Patients Only) Modified Rankin (Stroke Patients Only) Pre-Morbid Rankin Score: No symptoms Modified Rankin: Moderately severe disability     Balance   Sitting-balance support: No upper extremity supported, Feet supported Sitting balance-Leahy Scale: Fair     Standing balance support: During functional activity, Reliant on assistive device for balance Standing balance-Leahy Scale: Poor Standing balance comment: reliant on RW  Pertinent Vitals/Pain Pain Assessment Faces Pain Scale: Hurts a little bit Pain Location: R shoulder grimacing with some movements, but not all Pain Descriptors / Indicators: Grimacing Pain Intervention(s): Monitored during session, Repositioned    Home  Living Family/patient expects to be discharged to:: Inpatient rehab Living Arrangements: Children Available Help at Discharge: Family Type of Home: House         Home Layout: Laundry or work area in basement;Able to live on main level with bedroom/bathroom Home Equipment: Gilmer Mor - single point Additional Comments: Above gleaned form chart review; will need verification    Prior Function Prior Level of Function : Needs assist             Mobility Comments: Prior to stroke (11/4) - patient ambulated with cane - using rollator now ADLs Comments: Prior to stroke- states that her daughter has been helping her with dressing ony. Unsure but suspect a lot of assistance     Hand Dominance   Dominant Hand: Right    Extremity/Trunk Assessment   Upper Extremity Assessment Upper Extremity Assessment: Defer to OT evaluation    Lower Extremity Assessment Lower Extremity Assessment: Generalized weakness;RLE deficits/detail;LLE deficits/detail RLE Deficits / Details: strength grossly 3+/5 RLE Coordination:  (slow movement) LLE Deficits / Details: Slow movements to command 3- to 3+/5    Cervical / Trunk Assessment Cervical / Trunk Assessment: Kyphotic  Communication   Communication: No difficulties  Cognition Arousal/Alertness: Awake/alert Behavior During Therapy: WFL for tasks assessed/performed Overall Cognitive Status: No family/caregiver present to determine baseline cognitive functioning                                 General Comments: Remembered she had shoulder surgery in August.  Thought she was in [employer]. Initially asked if she was going back to the hospital, but she was just waking up at that point.        General Comments      Exercises     Assessment/Plan    PT Assessment Patient needs continued PT services  PT Problem List Decreased strength;Decreased activity tolerance;Decreased balance;Decreased mobility;Decreased knowledge of use of  DME;Decreased safety awareness;Decreased cognition       PT Treatment Interventions DME instruction;Gait training;Functional mobility training;Therapeutic activities;Therapeutic exercise;Balance training    PT Goals (Current goals can be found in the Care Plan section)  Acute Rehab PT Goals Patient Stated Goal: none stated PT Goal Formulation: With patient Time For Goal Achievement: 02/15/22 Potential to Achieve Goals: Good    Frequency Min 4X/week     Co-evaluation               AM-PAC PT "6 Clicks" Mobility  Outcome Measure Help needed turning from your back to your side while in a flat bed without using bedrails?: A Little Help needed moving from lying on your back to sitting on the side of a flat bed without using bedrails?: A Lot Help needed moving to and from a bed to a chair (including a wheelchair)?: A Lot Help needed standing up from a chair using your arms (e.g., wheelchair or bedside chair)?: A Little Help needed to walk in hospital room?: A Lot Help needed climbing 3-5 steps with a railing? : Total 6 Click Score: 13    End of Session Equipment Utilized During Treatment: Gait belt Activity Tolerance: Patient limited by fatigue Patient left: in chair;with call bell/phone within reach;with chair alarm set Nurse Communication: Mobility  status PT Visit Diagnosis: Unsteadiness on feet (R26.81);Muscle weakness (generalized) (M62.81)    Time: 6269-4854 PT Time Calculation (min) (ACUTE ONLY): 25 min   Charges:   PT Evaluation $PT Eval Moderate Complexity: 1 Mod PT Treatments $Therapeutic Activity: 8-22 mins        Colin Broach., PT Office [phone] Acute Rehab 02/01/2022   Enzo Montgomery 02/01/2022, 9:37 AM

## 2022-02-02 ENCOUNTER — Encounter (HOSPITAL_COMMUNITY): Payer: Self-pay | Admitting: Internal Medicine

## 2022-02-02 DIAGNOSIS — K921 Melena: Secondary | ICD-10-CM | POA: Diagnosis not present

## 2022-02-02 LAB — CBC
HCT: 33.8 % — ABNORMAL LOW (ref 36.0–46.0)
Hemoglobin: 11.1 g/dL — ABNORMAL LOW (ref 12.0–15.0)
MCH: 31.7 pg (ref 26.0–34.0)
MCHC: 32.8 g/dL (ref 30.0–36.0)
MCV: 96.6 fL (ref 80.0–100.0)
Platelets: 111 10*3/uL — ABNORMAL LOW (ref 150–400)
RBC: 3.5 MIL/uL — ABNORMAL LOW (ref 3.87–5.11)
RDW: 17.4 % — ABNORMAL HIGH (ref 11.5–15.5)
WBC: 9.7 10*3/uL (ref 4.0–10.5)
nRBC: 0 % (ref 0.0–0.2)

## 2022-02-02 LAB — SURGICAL PATHOLOGY

## 2022-02-02 MED ORDER — HYDROCHLOROTHIAZIDE 25 MG PO TABS
25.0000 mg | ORAL_TABLET | Freq: Every day | ORAL | Status: DC
  Administered 2022-02-02 – 2022-02-06 (×5): 25 mg via ORAL
  Filled 2022-02-02 (×5): qty 1

## 2022-02-02 NOTE — Progress Notes (Signed)
PROGRESS NOTE    Wanda Valdez  NVB:[id number] DOB: 18-Jan-1946 DOA: 01/28/2022 PCP: Raquel James, MD     Brief Narrative:  Wanda Valdez is a 76 year old female with PMHx of lymphocytic colitis, HTN, recent right shoulder surgery.  She was also recently admitted 01/01/2022 to 01/09/2022.  During that hospitalization she was diagnosed with acute cystitis and acute CVA, started on aspirin and Plavix (for 3 weeks, then aspirin alone).  She also had a EEG that showed evidence of seizures.  She was discharged home with home health.  Since discharge, patient states she is continue to feel ill.   Labs revealed anemia with hemoglobin of 7, heme positive stool.  Patient was referred for admission and GI consulted.  Patient underwent EGD 12/1 which revealed nonbleeding gastric ulcer, gastritis, duodenitis, nonbleeding duodenal ulcer, nonbleeding angiodysplastic lesion.  She was recommended for Protonix 40 mg twice daily for 2 months.  New events last 24 hours / Subjective: No further bleeding reported. Discussed CIR vs SNF for discharge.   Assessment & Plan:   Principal Problem:   GI bleeding Active Problems:   Dysphagia   Protein-calorie malnutrition, severe   Current chronic use of systemic steroids   Seizures (HCC)   Cerebrovascular accident (CVA) due to embolism of right middle cerebral artery (HCC)   Anemia due to blood loss, acute   Upper GI bleed   GI bleeding, symptomatic anemia, acute blood loss anemia -Transfused 2 unit packed red blood cells  -S/p EGD 12/1 which revealed nonbleeding gastric ulcer, gastritis, duodenitis, nonbleeding duodenal ulcer, nonbleeding angiodysplastic lesion -Aspirin and Plavix on hold.  Seems to have completed dual antiplatelet therapy for 3 weeks.  She should be on aspirin.  Discussed with Dr. Ewing Schlein 12/2. Resume aspirin today  -PPI, carafate  -Hemoglobin remains stable  History of CVA -Aspirin, lipitor   Seizure disorder -Continue Depakote,  Vimpat  Dysphagia -Status post MBS.  Now on dysphagia 2 diet  HTN -Lopressor, HCTZ - increase dose today    DVT prophylaxis:  SCDs Start: 01/29/22 0215  Code Status: Full Family Communication: None at bedside, discussed with daughter over the phone 12/3  Disposition Plan:  Status is: Inpatient Remains inpatient appropriate because: CIR recommended   Antimicrobials:  Anti-infectives (From admission, onward)    None        Objective: Vitals:   02/01/22 2138 02/01/22 2247 02/02/22 0638 02/02/22 1319  BP: (!) 180/89 (!) 181/74 (!) 170/84 (!) 148/76  Pulse: 62 62 65 (!) 55  Resp: 16  17 18   Temp: 98.2 F (36.8 C)  97.8 F (36.6 C) 97.6 F (36.4 C)  TempSrc: Oral  Oral Oral  SpO2: 97% 99% 100% 96%  Weight:      Height:        Intake/Output Summary (Last 24 hours) at 02/02/2022 1410 Last data filed at 02/02/2022 1300 Gross per 24 hour  Intake 460 ml  Output 500 ml  Net -40 ml    Filed Weights   01/28/22 1708  Weight: 41.6 kg    Examination:  General exam: Appears calm and comfortable, frail-appearing Respiratory system: Clear to auscultation. Respiratory effort normal.  Cardiovascular system: S1 & S2 heard, RRR. No murmurs. No pedal edema. Gastrointestinal system: Abdomen is nondistended, soft and nontender. Normal bowel sounds heard. Central nervous system: Alert Extremities: Symmetric in appearance  Skin: No rashes, lesions or ulcers on exposed skin  Psychiatry: Mood stable   Data Reviewed: I have personally reviewed following labs and imaging studies  CBC:  Recent Labs  Lab 01/28/22 1713 01/29/22 0544 01/30/22 0257 01/30/22 0556 01/31/22 0641 02/01/22 0540 02/02/22 0431  WBC 8.0  --  9.8  --  9.8 11.3* 9.7  NEUTROABS 5.4  --   --   --   --   --   --   HGB 7.0*   < > 10.0* 9.4* 10.7* 10.4* 11.1*  HCT 22.2*   < > 30.2* 28.4* 33.2* 31.4* 33.8*  MCV 103.7*  --  94.7  --  98.2 94.9 96.6  PLT 245  --  164  --  129* 120* 111*   < > = values in  this interval not displayed.    Basic Metabolic Panel: Recent Labs  Lab 01/28/22 1713 01/29/22 0544  NA 139 139  K 3.8 3.6  CL 107 112*  CO2 23 21*  GLUCOSE 113* 82  BUN 26* 21  CREATININE 1.18* 0.96  CALCIUM 8.9 8.1*  MG  --  1.8    GFR: Estimated Creatinine Clearance: 31.2 mL/min (by C-G formula based on SCr of 0.96 mg/dL). Liver Function Tests: Recent Labs  Lab 01/28/22 1713  AST 24  ALT 10  ALKPHOS 70  BILITOT 0.6  PROT 6.6  ALBUMIN 2.4*    No results for input(s): "LIPASE", "AMYLASE" in the last 168 hours. No results for input(s): "AMMONIA" in the last 168 hours. Coagulation Profile: Recent Labs  Lab 01/28/22 1713  INR 1.1    Cardiac Enzymes: No results for input(s): "CKTOTAL", "CKMB", "CKMBINDEX", "TROPONINI" in the last 168 hours. BNP (last 3 results) No results for input(s): "PROBNP" in the last 8760 hours. HbA1C: No results for input(s): "HGBA1C" in the last 72 hours. CBG: Recent Labs  Lab 01/28/22 1715  GLUCAP 84    Lipid Profile: No results for input(s): "CHOL", "HDL", "LDLCALC", "TRIG", "CHOLHDL", "LDLDIRECT" in the last 72 hours. Thyroid Function Tests: No results for input(s): "TSH", "T4TOTAL", "FREET4", "T3FREE", "THYROIDAB" in the last 72 hours. Anemia Panel: No results for input(s): "VITAMINB12", "FOLATE", "FERRITIN", "TIBC", "IRON", "RETICCTPCT" in the last 72 hours. Sepsis Labs: No results for input(s): "PROCALCITON", "LATICACIDVEN" in the last 168 hours.  Recent Results (from the past 240 hour(s))  Resp Panel by RT-PCR (Flu A&B, Covid) Anterior Nasal Swab     Status: None   Collection Time: 01/28/22  5:20 PM   Specimen: Anterior Nasal Swab  Result Value Ref Range Status   SARS Coronavirus 2 by RT PCR NEGATIVE NEGATIVE Final    Comment: (NOTE) SARS-CoV-2 target nucleic acids are NOT DETECTED.  The SARS-CoV-2 RNA is generally detectable in upper respiratory specimens during the acute phase of infection. The  lowest concentration of SARS-CoV-2 viral copies this assay can detect is 138 copies/mL. A negative result does not preclude SARS-Cov-2 infection and should not be used as the sole basis for treatment or other patient management decisions. A negative result may occur with  improper specimen collection/handling, submission of specimen other than nasopharyngeal swab, presence of viral mutation(s) within the areas targeted by this assay, and inadequate number of viral copies(<138 copies/mL). A negative result must be combined with clinical observations, patient history, and epidemiological information. The expected result is Negative.  Fact Sheet for Patients:  BloggerCourse.com  Fact Sheet for Healthcare Providers:  SeriousBroker.it  This test is no t yet approved or cleared by the [place] FDA and  has been authorized for detection and/or diagnosis of SARS-CoV-2 by FDA under an Emergency Use Authorization (EUA). This EUA will remain  in effect (  meaning this test can be used) for the duration of the COVID-19 declaration under Section 564(b)(1) of the Act, 21 U.S.C.section 360bbb-3(b)(1), unless the authorization is terminated  or revoked sooner.       Influenza A by PCR NEGATIVE NEGATIVE Final   Influenza B by PCR NEGATIVE NEGATIVE Final    Comment: (NOTE) The Xpert Xpress SARS-CoV-2/FLU/RSV plus assay is intended as an aid in the diagnosis of influenza from Nasopharyngeal swab specimens and should not be used as a sole basis for treatment. Nasal washings and aspirates are unacceptable for Xpert Xpress SARS-CoV-2/FLU/RSV testing.  Fact Sheet for Patients: BloggerCourse.com  Fact Sheet for Healthcare Providers: SeriousBroker.it  This test is not yet approved or cleared by the [place] FDA and has been authorized for detection and/or diagnosis of SARS-CoV-2 by FDA under  an Emergency Use Authorization (EUA). This EUA will remain in effect (meaning this test can be used) for the duration of the COVID-19 declaration under Section 564(b)(1) of the Act, 21 U.S.C. section 360bbb-3(b)(1), unless the authorization is terminated or revoked.  Performed at [facility], [address] [place], [place] [address]   Urine Culture     Status: Abnormal   Collection Time: 01/28/22  9:14 PM   Specimen: Urine, Clean Catch  Result Value Ref Range Status   Specimen Description   Final    URINE, CLEAN CATCH Performed at [facility], [address] [place], [place] [address]    Special Requests   Final    NONE Performed at [facility], [address] [place], [place] [address]    Culture 70,000 COLONIES/mL ENTEROCOCCUS FAECALIS (A)  Final   Report Status 01/30/2022 FINAL  Final   Organism ID, Bacteria ENTEROCOCCUS FAECALIS (A)  Final      Susceptibility   Enterococcus faecalis - MIC*    AMPICILLIN <=2 SENSITIVE Sensitive     NITROFURANTOIN <=16 SENSITIVE Sensitive     VANCOMYCIN 1 SENSITIVE Sensitive     * 70,000 COLONIES/mL ENTEROCOCCUS FAECALIS      Radiology Studies: No results found.    Scheduled Meds:  aspirin EC  81 mg Oral Daily   atorvastatin  20 mg Oral Daily   divalproex  500 mg Oral Q8H   feeding supplement  237 mL Oral BID BM   hydrochlorothiazide  25 mg Oral Daily   influenza vaccine adjuvanted  0.5 mL Intramuscular Tomorrow-1000   lacosamide  100 mg Oral BID   metoprolol tartrate  12.5 mg Oral BID   multivitamin with minerals  1 tablet Oral Daily   pantoprazole  40 mg Oral BID   pneumococcal 20-valent conjugate vaccine  0.5 mL Intramuscular Tomorrow-1000   sucralfate  1 g Oral TID WC & HS   Continuous Infusions:     LOS: 5 days     Noralee Stain, DO [facility] 02/02/2022, 2:10 PM   Available via Epic secure chat 7am-7pm After these hours, please refer to coverage provider listed on  [email or web address]

## 2022-02-02 NOTE — Progress Notes (Signed)
Inpatient Rehab Admissions Coordinator:    I spoke with pt's daughter regarding potential CIR admit. She states that she is interested but unsure that family can provide 24/7 support at d/c. States she will talk to her other sister this evening and get back to me, but is leaning SNF.   Megan Salon, MS, CCC-SLP Rehab Admissions Coordinator  [phone] (celll) [phone] (office)

## 2022-02-02 NOTE — Progress Notes (Signed)
Speech Language Pathology Treatment: Dysphagia  Patient Details Name: Masyn Fullam MRN: [id number] DOB: September 23, 1945 Today's Date: 02/02/2022 Time: 1005-1030 SLP Time Calculation (min) (ACUTE ONLY): 25 min  Assessment / Plan / Recommendation Clinical Impression  Patient seen by SLP for skilled treatment focused on dysphagia goals. Patient was awake, alert and RN giving her medications (whole in applesauce). Patient would request sips of water after each bite of applesauce and meds. No overt s/s aspiration or penetration observed with medication. She accepted a couple bites of oatmeal and had 6 small pieces of diced sausage but declined scrambled eggs. When asked what she did want, she said "ginger bread" and then asked about "brownies" but she accepted graham crackers. Mastication and bolus formation delayed which is likely at or near baseline as she has no dentures and is effectively edentulous. A few instances of delayed cough observed which primarily occurred when patient still masticating solids. Overall PO intake is very poor but she does appear to be tolerating Dys 2 (fine chop/minced) solids and thin liquids adequately. SLP will continue to follow for diet toleration but do not anticipate advancement as current diet consistency appears to be very close to her baseline.    HPI HPI: Patient is a 76 y.o. female with PMH: lymphocytic colitis, HTN, recent right shoulder surgery(one month ago), esophageal stricture s/p EGD with dilation. 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 SLE completed on 01/04/2022 and 01/06/2022.  HH SLP services recommended but no acute level SLP recommended.Pt dc'd and then returned to hospital on 11/29 due to concern for GI bleed - she is on Eliquis.  Swallow eval ordered.  GI referral pending.  Pt is receiving blood transfusion at this time.  Prior esophagram showed narrowing - cervical and distally 12/2020 and pt is s/p endoscopy- empirically dilated to 15  mm proximal.  CXR showed tortulous thoracic aorta.  Medication list includes Carafate, Protonix and depakote sprinkles.  CT head showed evolving right posterior parietal CVA.  Pt endorses cough recently - productive to secretions and she eats soft foods at home.      SLP Plan  Continue with current plan of care      Recommendations for follow up therapy are one component of a multi-disciplinary discharge planning process, led by the attending physician.  Recommendations may be updated based on patient status, additional functional criteria and insurance authorization.    Recommendations  Diet recommendations: Dysphagia 2 (fine chop);Thin liquid Liquids provided via: Cup;Straw Medication Administration: Whole meds with puree Supervision: Staff to assist with self feeding;Full supervision/cueing for compensatory strategies Compensations: Minimize environmental distractions;Slow rate;Small sips/bites Postural Changes and/or Swallow Maneuvers: Seated upright 90 degrees;Upright 30-60 min after meal                Oral Care Recommendations: Oral care BID Follow Up Recommendations: Acute inpatient rehab (3hours/day) Assistance recommended at discharge: Frequent or constant Supervision/Assistance SLP Visit Diagnosis: Dysphagia, oropharyngeal phase (R13.12);Dysphagia, pharyngoesophageal phase (R13.14) Plan: Continue with current plan of care         Angela Nevin, MA, CCC-SLP Speech Therapy

## 2022-02-02 NOTE — Progress Notes (Signed)
Physical Therapy Treatment Patient Details Name: Wanda Valdez MRN: [id number] DOB: 1945-11-02 Today's Date: 02/02/2022   History of Present Illness Wanda Valdez is a 76 year old female with PMHx of lymphocytic colitis, HTN, recent right shoulder surgery.  She was also recently admitted 01/01/2022 to 01/09/2022.  During that hospitalization she was diagnosed with acute cystitis and acute CVA.  Patient admitted for GI bleed.  Patient underwent EGD 12/1 which revealed nonbleeding gastric ulcer, gastritis, duodenitis, nonbleeding duodenal ulcer, nonbleeding angiodysplastic lesion.    PT Comments    The patient is lethargic , required multimodal cues to arouse. Patient quite "rigid " in her legs and trunk.  Patient required assistance to step in forward progression, moving RW as patient stepped forward, otherwise, stays static in standing. Manual facilitation at pelvis to assist patient to flex at hips in order to sit down to toilet and  recliner. No family present to discuss DC plan and support system.  Recommendations for follow up therapy are one component of a multi-disciplinary discharge planning process, led by the attending physician.  Recommendations may be updated based on patient status, additional functional criteria and insurance authorization.  Follow Up Recommendations  Acute inpatient rehab (3hours/day)     Assistance Recommended at Discharge Frequent or constant Supervision/Assistance  Patient can return home with the following A lot of help with walking and/or transfers   Equipment Recommendations  None recommended by PT    Recommendations for Other Services       Precautions / Restrictions Precautions Type of Shoulder Precautions: recent R shoulder surgery, no sling and unable to locate any precautions/restrictions. Pt states surgery was at [facility] in August 2023, Left visual deficits Restrictions Other Position/Activity Restrictions: Uncertain at this time, recent  shoulder surgery in [facility] August or Oct 2023??     Mobility  Bed Mobility Overal bed mobility: Needs Assistance Bed Mobility: Supine to Sit     Supine to sit: Max assist     General bed mobility comments: pt is lethargic, arouses, legs are very rigid, moved legs to bed edge and lifted patient up to sitting. once upright, able to balance    Transfers Overall transfer level: Needs assistance Equipment used: Rolling walker (2 wheels) Transfers: Sit to/from Stand Sit to Stand: Max assist           General transfer comment: max assist to facilitate patient at the  pelvis to sit down to toilet and to Recliner, Max assist to  power up to stand from bed and toilet.    Ambulation/Gait Ambulation/Gait assistance: Mod assist Gait Distance (Feet): 15 Feet (x 2) Assistive device: Rolling walker (2 wheels) Gait Pattern/deviations: Step-to pattern, Shuffle Gait velocity: decr     General Gait Details: pt. bears weight and stands but does not push RW forward, therapist required to push RW and patient in a forward progression to advance ambulation   Stairs             Wheelchair Mobility    Modified Rankin (Stroke Patients Only)       Balance Overall balance assessment: Needs assistance Sitting-balance support: Bilateral upper extremity supported, Feet supported Sitting balance-Leahy Scale: Poor     Standing balance support: Bilateral upper extremity supported, Reliant on assistive device for balance, During functional activity Standing balance-Leahy Scale: Poor Standing balance comment: reliant on RW, mod assist for balance  Cognition Arousal/Alertness: Lethargic Behavior During Therapy: Flat affect Overall Cognitive Status: No family/caregiver present to determine baseline cognitive functioning                                 General Comments: patient minimally verbal, noted drifting  off when not being  stimulated with mobility. patient noted to be somewhat catatonic- when place hand in air, arm stays,        Exercises      General Comments        Pertinent Vitals/Pain Pain Assessment Faces Pain Scale: No hurt    Home Living                          Prior Function            PT Goals (current goals can now be found in the care plan section) Progress towards PT goals: Progressing toward goals    Frequency    Min 4X/week      PT Plan Current plan remains appropriate    Co-evaluation              AM-PAC PT "6 Clicks" Mobility   Outcome Measure  Help needed turning from your back to your side while in a flat bed without using bedrails?: Total Help needed moving from lying on your back to sitting on the side of a flat bed without using bedrails?: Total Help needed moving to and from a bed to a chair (including a wheelchair)?: A Lot Help needed standing up from a chair using your arms (e.g., wheelchair or bedside chair)?: A Lot Help needed to walk in hospital room?: A Lot Help needed climbing 3-5 steps with a railing? : Total 6 Click Score: 9    End of Session Equipment Utilized During Treatment: Gait belt Activity Tolerance: Patient limited by fatigue Patient left: in chair;with call bell/phone within reach;with chair alarm set Nurse Communication: Mobility status       Time: 1202-1230 PT Time Calculation (min) (ACUTE ONLY): 28 min  Charges:  $Gait Training: 8-22 mins $Self Care/Home Management: 8-22                     Blanchard Kelch PT Acute Rehabilitation Services Office [phone] Weekend [phone]    Rada Hay 02/02/2022, 1:09 PM

## 2022-02-03 ENCOUNTER — Inpatient Hospital Stay (HOSPITAL_COMMUNITY): Payer: Medicare Other

## 2022-02-03 DIAGNOSIS — K921 Melena: Secondary | ICD-10-CM | POA: Diagnosis not present

## 2022-02-03 LAB — HEMOGLOBIN AND HEMATOCRIT, BLOOD
HCT: 30.6 % — ABNORMAL LOW (ref 36.0–46.0)
Hemoglobin: 10.1 g/dL — ABNORMAL LOW (ref 12.0–15.0)

## 2022-02-03 NOTE — NC FL2 (Signed)
[place] MEDICAID FL2 LEVEL OF CARE FORM     IDENTIFICATION  Patient Name: Wanda Valdez Birthdate: [birth year] Sex: female Admission Date (Current Location): 01/28/2022  [facility] and [place] Number:  Producer, television/film/video and Address:  [facility],  [address] [place], [place] [address]      Provider Number: [id number]  Attending Physician Name and Address:  Noralee Stain, DO  Relative Name and Phone Number:  Kahlee Metivier     [phone]    Current Level of Care: Hospital Recommended Level of Care: Skilled Nursing Facility Prior Approval Number:    Date Approved/Denied:   PASRR Number: [id number]  Discharge Plan: SNF    Current Diagnoses: Patient Active Problem List   Diagnosis Date Noted   Anemia due to blood loss, acute 01/29/2022   Upper GI bleed 01/29/2022   GI bleeding 01/28/2022   Cerebrovascular accident (CVA) due to embolism of right middle cerebral artery (HCC) 01/05/2022   Hypotension due to hypovolemia 01/02/2022   Acute cystitis without hematuria 01/02/2022   High anion gap metabolic acidosis 01/02/2022   Seizures (HCC) 01/02/2022   Shock (HCC) 01/02/2022   Sepsis (HCC) 01/01/2022   Hypovolemic shock (HCC) 01/26/2021   Syncope 09/19/2020   Current chronic use of systemic steroids 09/19/2020   Protein-calorie malnutrition, severe 09/12/2020   Hypokalemia 09/10/2020   Dysphagia 09/10/2020   Diarrhea 09/10/2020   Dehydration 09/10/2020   Transaminitis 09/10/2020   AKI (acute kidney injury) (HCC) 09/10/2020   Lymphocytic colitis 09/10/2020   Rib pain on right side 09/10/2020   SOB (shortness of breath)     Orientation RESPIRATION BLADDER Height & Weight     Self  Normal Incontinent Weight: 41.6 kg Height:  4\' 10"  (147.3 cm)  BEHAVIORAL SYMPTOMS/MOOD NEUROLOGICAL BOWEL NUTRITION STATUS  Other (Comment) (forgetful) Convulsions/Seizures (History of seizures on Depakote) Incontinent Diet (Dysphagia 2 with this liquids)   AMBULATORY STATUS COMMUNICATION OF NEEDS Skin   Limited Assist Verbally Normal                       Personal Care Assistance Level of Assistance  Bathing, Feeding, Dressing Bathing Assistance: Limited assistance Feeding assistance: Limited assistance Dressing Assistance: Limited assistance     Functional Limitations Info  Sight, Hearing, Speech Sight Info: Impaired (Blurred vision to left eye) Hearing Info: Adequate      SPECIAL CARE FACTORS FREQUENCY  PT (By licensed PT), OT (By licensed OT)     PT Frequency: 5x/wk OT Frequency: 5x/wk            Contractures      Additional Factors Info  Code Status, Allergies, Psychotropic, Insulin Sliding Scale, Isolation Precautions, Suctioning Needs Code Status Info: Full Allergies Info: Lidocaine Psychotropic Info: see discharge summary Insulin Sliding Scale Info: see discharge summary Isolation Precautions Info: none Suctioning Needs: n/a   Current Medications (02/03/2022):  This is the current hospital active medication list Current Facility-Administered Medications  Medication Dose Route Frequency Provider Last Rate Last Admin   acetaminophen (TYLENOL) tablet 650 mg  650 mg Oral Q6H PRN 14/06/2021, DO   650 mg at 02/02/22 14/04/23   Or   acetaminophen (TYLENOL) suppository 650 mg  650 mg Rectal Q6H PRN 0272, DO       aspirin EC tablet 81 mg  81 mg Oral Daily Lynann Bologna, DO   81 mg at 02/03/22 0951   atorvastatin (LIPITOR) tablet 20 mg  20 mg Oral Daily 14/05/23,  DO   20 mg at 02/03/22 0951   divalproex (DEPAKOTE SPRINKLE) capsule 500 mg  500 mg Oral Q8H Noralee Stain, DO   500 mg at 02/03/22 9371   feeding supplement (ENSURE ENLIVE / ENSURE PLUS) liquid 237 mL  237 mL Oral BID BM Noralee Stain, DO   237 mL at 02/03/22 0951   hydrALAZINE (APRESOLINE) injection 5 mg  5 mg Intravenous Q4H PRN Liliane Shi H, DO   5 mg at 02/01/22 2144   hydrochlorothiazide (HYDRODIURIL) tablet 25 mg  25  mg Oral Daily Noralee Stain, DO   25 mg at 02/03/22 6967   influenza vaccine adjuvanted (FLUAD) injection 0.5 mL  0.5 mL Intramuscular Tomorrow-1000 Mansy, Jan A, MD       lacosamide (VIMPAT) tablet 100 mg  100 mg Oral BID Noralee Stain, DO   100 mg at 02/03/22 0951   metoprolol tartrate (LOPRESSOR) tablet 12.5 mg  12.5 mg Oral BID Noralee Stain, DO   12.5 mg at 02/03/22 8938   multivitamin with minerals tablet 1 tablet  1 tablet Oral Daily Noralee Stain, DO   1 tablet at 02/03/22 0951   pantoprazole (PROTONIX) EC tablet 40 mg  40 mg Oral BID Lynann Bologna, DO   40 mg at 02/03/22 1017   pneumococcal 20-valent conjugate vaccine (PREVNAR 20) injection 0.5 mL  0.5 mL Intramuscular Tomorrow-1000 Liliane Shi H, DO       sucralfate (CARAFATE) 1 GM/10ML suspension 1 g  1 g Oral TID WC & HS Liliane Shi H, DO   1 g at 02/03/22 1155     Discharge Medications: Please see discharge summary for a list of discharge medications.  Relevant Imaging Results:  Relevant Lab Results:   Additional Information SS# [id number]  Beckie Busing, RN

## 2022-02-03 NOTE — Progress Notes (Signed)
Inpatient Rehab Admissions Coordinator:    I spoke with pt.'s daughter and they would prefer SNF over CIR. Prefer Wanda Valdez. CIR will sign off.   Megan Salon, MS, CCC-SLP Rehab Admissions Coordinator  [phone] (celll) [phone] (office)

## 2022-02-03 NOTE — Progress Notes (Signed)
PROGRESS NOTE    Wanda Valdez  [id number] DOB: 09-30-1945 DOA: 01/28/2022 PCP: Raquel James, MD     Brief Narrative:  Wanda Valdez is a 76 year old female with PMHx of lymphocytic colitis, HTN, recent right shoulder surgery.  She was also recently admitted 01/01/2022 to 01/09/2022.  During that hospitalization she was diagnosed with acute cystitis and acute CVA, started on aspirin and Plavix (for 3 weeks, then aspirin alone).  She also had a EEG that showed evidence of seizures.  She was discharged home with home health.  Since discharge, patient states she is continue to feel ill.   Labs revealed anemia with hemoglobin of 7, heme positive stool.  Patient was referred for admission and GI consulted.  Patient underwent EGD 12/1 which revealed nonbleeding gastric ulcer, gastritis, duodenitis, nonbleeding duodenal ulcer, nonbleeding angiodysplastic lesion.  She was recommended for Protonix 40 mg twice daily for 2 months.  New events last 24 hours / Subjective: No further issues. Awaiting SNF placement   Assessment & Plan:   Principal Problem:   GI bleeding Active Problems:   Dysphagia   Protein-calorie malnutrition, severe   Current chronic use of systemic steroids   Seizures (HCC)   Cerebrovascular accident (CVA) due to embolism of right middle cerebral artery (HCC)   Anemia due to blood loss, acute   Upper GI bleed   GI bleeding, symptomatic anemia, acute blood loss anemia -Transfused 2 unit packed red blood cells  -S/p EGD 12/1 which revealed nonbleeding gastric ulcer, gastritis, duodenitis, nonbleeding duodenal ulcer, nonbleeding angiodysplastic lesion -Aspirin and Plavix on hold.  Seems to have completed dual antiplatelet therapy for 3 weeks.  She should be on aspirin.  Discussed with Dr. Ewing Schlein 12/2. Resume aspirin  -PPI, carafate  -Hemoglobin remains stable  History of CVA -Aspirin, lipitor   Seizure disorder -Continue Depakote, Vimpat  Dysphagia -Status  post MBS.  Now on dysphagia 2 diet  HTN -Lopressor, HCTZ    DVT prophylaxis:  SCDs Start: 01/29/22 0215  Code Status: Full Family Communication: None at bedside, discussed with daughter over the phone 12/3  Disposition Plan:  Status is: Inpatient Remains inpatient appropriate because: SNF placement   Antimicrobials:  Anti-infectives (From admission, onward)    None        Objective: Vitals:   02/02/22 1319 02/02/22 2002 02/02/22 2145 02/03/22 0442  BP: (!) 148/76 (!) 141/84  (!) 158/68  Pulse: (!) 55 64 83 72  Resp: 18 18  14   Temp: 97.6 F (36.4 C) (!) 97.5 F (36.4 C)  98 F (36.7 C)  TempSrc: Oral Oral  Oral  SpO2: 96% 99%  100%  Weight:      Height:        Intake/Output Summary (Last 24 hours) at 02/03/2022 1230 Last data filed at 02/02/2022 1300 Gross per 24 hour  Intake 240 ml  Output --  Net 240 ml    Filed Weights   01/28/22 1708  Weight: 41.6 kg    Examination:  General exam: Appears calm and comfortable, frail-appearing Respiratory system: Clear to auscultation. Respiratory effort normal.  Cardiovascular system: S1 & S2 heard, RRR. No murmurs. No pedal edema. Gastrointestinal system: Abdomen is nondistended, soft and nontender. Normal bowel sounds heard. Central nervous system: Alert Extremities: Symmetric in appearance  Skin: No rashes, lesions or ulcers on exposed skin  Psychiatry: Mood stable   Data Reviewed: I have personally reviewed following labs and imaging studies  CBC: Recent Labs  Lab 01/28/22 1713 01/29/22 0544 01/30/22 0257  01/30/22 0556 01/31/22 0641 02/01/22 0540 02/02/22 0431 02/03/22 0608  WBC 8.0  --  9.8  --  9.8 11.3* 9.7  --   NEUTROABS 5.4  --   --   --   --   --   --   --   HGB 7.0*   < > 10.0* 9.4* 10.7* 10.4* 11.1* 10.1*  HCT 22.2*   < > 30.2* 28.4* 33.2* 31.4* 33.8* 30.6*  MCV 103.7*  --  94.7  --  98.2 94.9 96.6  --   PLT 245  --  164  --  129* 120* 111*  --    < > = values in this interval not  displayed.    Basic Metabolic Panel: Recent Labs  Lab 01/28/22 1713 01/29/22 0544  NA 139 139  K 3.8 3.6  CL 107 112*  CO2 23 21*  GLUCOSE 113* 82  BUN 26* 21  CREATININE 1.18* 0.96  CALCIUM 8.9 8.1*  MG  --  1.8    GFR: Estimated Creatinine Clearance: 31.2 mL/min (by C-G formula based on SCr of 0.96 mg/dL). Liver Function Tests: Recent Labs  Lab 01/28/22 1713  AST 24  ALT 10  ALKPHOS 70  BILITOT 0.6  PROT 6.6  ALBUMIN 2.4*    No results for input(s): "LIPASE", "AMYLASE" in the last 168 hours. No results for input(s): "AMMONIA" in the last 168 hours. Coagulation Profile: Recent Labs  Lab 01/28/22 1713  INR 1.1    Cardiac Enzymes: No results for input(s): "CKTOTAL", "CKMB", "CKMBINDEX", "TROPONINI" in the last 168 hours. BNP (last 3 results) No results for input(s): "PROBNP" in the last 8760 hours. HbA1C: No results for input(s): "HGBA1C" in the last 72 hours. CBG: Recent Labs  Lab 01/28/22 1715  GLUCAP 84    Lipid Profile: No results for input(s): "CHOL", "HDL", "LDLCALC", "TRIG", "CHOLHDL", "LDLDIRECT" in the last 72 hours. Thyroid Function Tests: No results for input(s): "TSH", "T4TOTAL", "FREET4", "T3FREE", "THYROIDAB" in the last 72 hours. Anemia Panel: No results for input(s): "VITAMINB12", "FOLATE", "FERRITIN", "TIBC", "IRON", "RETICCTPCT" in the last 72 hours. Sepsis Labs: No results for input(s): "PROCALCITON", "LATICACIDVEN" in the last 168 hours.  Recent Results (from the past 240 hour(s))  Resp Panel by RT-PCR (Flu A&B, Covid) Anterior Nasal Swab     Status: None   Collection Time: 01/28/22  5:20 PM   Specimen: Anterior Nasal Swab  Result Value Ref Range Status   SARS Coronavirus 2 by RT PCR NEGATIVE NEGATIVE Final    Comment: (NOTE) SARS-CoV-2 target nucleic acids are NOT DETECTED.  The SARS-CoV-2 RNA is generally detectable in upper respiratory specimens during the acute phase of infection. The lowest concentration of SARS-CoV-2  viral copies this assay can detect is 138 copies/mL. A negative result does not preclude SARS-Cov-2 infection and should not be used as the sole basis for treatment or other patient management decisions. A negative result may occur with  improper specimen collection/handling, submission of specimen other than nasopharyngeal swab, presence of viral mutation(s) within the areas targeted by this assay, and inadequate number of viral copies(<138 copies/mL). A negative result must be combined with clinical observations, patient history, and epidemiological information. The expected result is Negative.  Fact Sheet for Patients:  BloggerCourse.com  Fact Sheet for Healthcare Providers:  SeriousBroker.it  This test is no t yet approved or cleared by the [place] FDA and  has been authorized for detection and/or diagnosis of SARS-CoV-2 by FDA under an Emergency Use Authorization (EUA). This  EUA will remain  in effect (meaning this test can be used) for the duration of the COVID-19 declaration under Section 564(b)(1) of the Act, 21 U.S.C.section 360bbb-3(b)(1), unless the authorization is terminated  or revoked sooner.       Influenza A by PCR NEGATIVE NEGATIVE Final   Influenza B by PCR NEGATIVE NEGATIVE Final    Comment: (NOTE) The Xpert Xpress SARS-CoV-2/FLU/RSV plus assay is intended as an aid in the diagnosis of influenza from Nasopharyngeal swab specimens and should not be used as a sole basis for treatment. Nasal washings and aspirates are unacceptable for Xpert Xpress SARS-CoV-2/FLU/RSV testing.  Fact Sheet for Patients: BloggerCourse.com  Fact Sheet for Healthcare Providers: SeriousBroker.it  This test is not yet approved or cleared by the [place] FDA and has been authorized for detection and/or diagnosis of SARS-CoV-2 by FDA under an Emergency Use Authorization  (EUA). This EUA will remain in effect (meaning this test can be used) for the duration of the COVID-19 declaration under Section 564(b)(1) of the Act, 21 U.S.C. section 360bbb-3(b)(1), unless the authorization is terminated or revoked.  Performed at [facility], [address] [place], [place] [address]   Urine Culture     Status: Abnormal   Collection Time: 01/28/22  9:14 PM   Specimen: Urine, Clean Catch  Result Value Ref Range Status   Specimen Description   Final    URINE, CLEAN CATCH Performed at [facility], [address] [place], [place] [address]    Special Requests   Final    NONE Performed at [facility], [address] [place], [place] [address]    Culture 70,000 COLONIES/mL ENTEROCOCCUS FAECALIS (A)  Final   Report Status 01/30/2022 FINAL  Final   Organism ID, Bacteria ENTEROCOCCUS FAECALIS (A)  Final      Susceptibility   Enterococcus faecalis - MIC*    AMPICILLIN <=2 SENSITIVE Sensitive     NITROFURANTOIN <=16 SENSITIVE Sensitive     VANCOMYCIN 1 SENSITIVE Sensitive     * 70,000 COLONIES/mL ENTEROCOCCUS FAECALIS      Radiology Studies: No results found.    Scheduled Meds:  aspirin EC  81 mg Oral Daily   atorvastatin  20 mg Oral Daily   divalproex  500 mg Oral Q8H   feeding supplement  237 mL Oral BID BM   hydrochlorothiazide  25 mg Oral Daily   influenza vaccine adjuvanted  0.5 mL Intramuscular Tomorrow-1000   lacosamide  100 mg Oral BID   metoprolol tartrate  12.5 mg Oral BID   multivitamin with minerals  1 tablet Oral Daily   pantoprazole  40 mg Oral BID   pneumococcal 20-valent conjugate vaccine  0.5 mL Intramuscular Tomorrow-1000   sucralfate  1 g Oral TID WC & HS   Continuous Infusions:     LOS: 6 days     Noralee Stain, DO [facility] 02/03/2022, 12:30 PM   Available via Epic secure chat 7am-7pm After these hours, please refer to coverage provider listed on [email or web address]

## 2022-02-03 NOTE — Progress Notes (Signed)
Occupational Therapy Treatment Patient Details Name: Wanda Valdez MRN: [id number] DOB: 10-25-1945 Today's Date: 02/03/2022   History of present illness Wanda Valdez is a 76 year old female with PMHx of lymphocytic colitis, HTN, recent right shoulder surgery.  She was also recently admitted 01/01/2022 to 01/09/2022.  During that hospitalization she was diagnosed with acute cystitis and acute CVA.  Patient admitted for GI bleed.  Patient underwent EGD 12/1 which revealed nonbleeding gastric ulcer, gastritis, duodenitis, nonbleeding duodenal ulcer, nonbleeding angiodysplastic lesion.   OT comments  Patient required increased time and effort, as well as increased cues for most tasks. She presented with minimal verbalizations, flat affect, and delayed initiation of tasks. She required max assist for supine to sit, mod assist x2 to stand using a RW, and mod-max assist for self-feeding seated in the chair. She required hand-over-hand assist for feeding, with potential vision impairment indicated. She will benefit from further OT services to facilitate improved ADL performance and to decrease the risk for progressive weakness and deconditioning.    Recommendations for follow up therapy are one component of a multi-disciplinary discharge planning process, led by the attending physician.  Recommendations may be updated based on patient status, additional functional criteria and insurance authorization.    Follow Up Recommendations  Skilled nursing-short term rehab (<3 hours/day)     Assistance Recommended at Discharge Frequent or constant Supervision/Assistance  Patient can return home with the following  A lot of help with walking and/or transfers;A lot of help with bathing/dressing/bathroom;Assistance with cooking/housework;Direct supervision/assist for financial management;Direct supervision/assist for medications management;Help with stairs or ramp for entrance;Assistance with feeding    Equipment Recommendations  Other (comment) (to be determined pending progress at next setting)       Precautions / Restrictions Precautions Precautions: Fall;Shoulder Type of Shoulder Precautions: recent R shoulder surgery, no sling and unable to locate any precautions/restrictions. Pt states surgery was at [facility] in August 2023, Left visual deficits Restrictions Weight Bearing Restrictions: No Other Position/Activity Restrictions: Uncertain at this time, recent shoulder surgery in [facility] August or Oct 2023??       Mobility Bed Mobility Overal bed mobility: Needs Assistance Bed Mobility: Supine to Sit     Supine to sit: Max assist     General bed mobility comments: Pt required increased time and effort, as well as max verbal and tactile cues for initiation and sequencing, in order to perform supine to sit and scooting to EOB    Transfers Overall transfer level: Needs assistance Equipment used: Rolling walker (2 wheels) Transfers: Sit to/from Stand Sit to Stand: Mod assist, +2 physical assistance, From elevated surface Stand pivot transfers: Mod assist                   ADL either performed or assessed with clinical judgement   ADL   Eating/Feeding: Moderate assistance;Sitting Eating/Feeding Details (indicate cue type and reason): ADL instruction was provided for self-feeding with the pt seated in the bedside chair. She had difficulty locating items on her tray, due to possible vision deficits. She required mod-max hand-over-hand assist to scoop food, as well as increased time for tasks, cues to bring food to her mouth, and assist for fine motor coordination. Grooming: Sitting;Moderate assistance;Cueing for sequencing Grooming Details (indicate cue type and reason): She performed face washing & teeth brushing seated EOB, requiring min to occasional mod assist for tasks, with cues required for recall, initiation, and sequencing.         Upper Body Dressing : Maximal  assistance;Sitting  Upper Body Dressing Details (indicate cue type and reason): simulated Lower Body Dressing: Total assistance Lower Body Dressing Details (indicate cue type and reason): for socks                               Cognition   Behavior During Therapy: Flat affect Overall Cognitive Status: No family/caregiver present to determine baseline cognitive functioning            General Comments: patient minimally verbal, suspected delayed cognitive processing, slow initiation of tasks                   Pertinent Vitals/ Pain       Pain Assessment Pain Assessment: Faces Pain Score: 0-No pain         Frequency  Min 2X/week        Progress Toward Goals  OT Goals(current goals can now be found in the care plan section)     Acute Rehab OT Goals Patient Stated Goal: not specifically stated by patient OT Goal Formulation: With patient Time For Goal Achievement: 02/14/22 Potential to Achieve Goals: Fair  Plan Discharge plan needs to be updated       AM-PAC OT "6 Clicks" Daily Activity     Outcome Measure   Help from another person eating meals?: A Lot Help from another person taking care of personal grooming?: A Lot Help from another person toileting, which includes using toliet, bedpan, or urinal?: Total Help from another person bathing (including washing, rinsing, drying)?: A Lot Help from another person to put on and taking off regular upper body clothing?: A Lot Help from another person to put on and taking off regular lower body clothing?: Total 6 Click Score: 10    End of Session Equipment Utilized During Treatment: Gait belt;Rolling walker (2 wheels)  OT Visit Diagnosis: Unsteadiness on feet (R26.81);Muscle weakness (generalized) (M62.81)   Activity Tolerance Other (comment) (Fair overall tolerance)   Patient Left in chair;with chair alarm set;with call bell/phone within reach   Nurse Communication Mobility status        Time:  1212-1240 OT Time Calculation (min): 28 min  Charges: OT General Charges $OT Visit: 1 Visit OT Treatments $Self Care/Home Management : 8-22 mins $Therapeutic Activity: 8-22 mins    Reuben Likes, OTR/L 02/03/2022, 2:38 PM

## 2022-02-03 NOTE — Care Management Important Message (Signed)
Important Message  Patient Details IM Letter given. Name: Rodneshia Greenhouse MRN: [id number] Date of Birth: 10-Oct-1945   Medicare Important Message Given:  Yes     Caren Macadam 02/03/2022, 8:25 AM

## 2022-02-03 NOTE — TOC Initial Note (Signed)
Transition of Care [facility]) - Initial/Assessment Note    Patient Details  Name: Wanda Valdez MRN: [id number] Date of Birth: Apr 06, 1945  [facility] CM/SW Contact:    Beckie Busing, RN Phone Number:[phone]  02/03/2022, 2:45 PM  Clinical Narrative:                 [facility] consulted for patient SNF placement. CM at bedside to discuss disposition plan with patient. Patient states that she would like for CM to discuss with daughter Dareen Piano. CM called daughter and made daughter awre of therapy recommendations. Daughter verbalized understanding of the referral and states that she has a preference for placement at [facility].  CM made daughter aware that patient info has been faxed out and CM is awaiting bed offers.  PASRR # [id number] A patient does not require prior authorization. CM informed daughter that [facility] will update her once we have bed offers.   Expected Discharge Plan: Skilled Nursing Facility Barriers to Discharge: Continued Medical Work up, SNF Pending bed offer   Patient Goals and CMS Choice Patient states their goals for this hospitalization and ongoing recovery are:: Wants to get better to go home CMS Medicare.gov Compare Post Acute Care list provided to:: Patient Represenative (must comment) (daughter) Choice offered to / list presented to : Adult Children  Expected Discharge Plan and Services Expected Discharge Plan: Skilled Nursing Facility In-house Referral: NA Discharge Planning Services: CM Consult Post Acute Care Choice: Skilled Nursing Facility Living arrangements for the past 2 months: Single Family Home                 DME Arranged: N/A DME Agency: NA       HH Arranged: NA HH Agency: NA        Prior Living Arrangements/Services Living arrangements for the past 2 months: Single Family Home Lives with:: Adult Children (daughter) Patient language and need for interpreter reviewed:: Yes Do you feel safe going back to the place where you  live?: Yes      Need for Family Participation in Patient Care: Yes (Comment) Care giver support system in place?: Yes (comment) Current home services: DME (cane, rollator, BSC) Criminal Activity/Legal Involvement Pertinent to Current Situation/Hospitalization: No - Comment as needed  Activities of Daily Living Home Assistive Devices/Equipment: Walker (specify type) ADL Screening (condition at time of admission) Patient's cognitive ability adequate to safely complete daily activities?: Yes Is the patient deaf or have difficulty hearing?: No Does the patient have difficulty seeing, even when wearing glasses/contacts?: Yes Does the patient have difficulty concentrating, remembering, or making decisions?: Yes Patient able to express need for assistance with ADLs?: Yes Does the patient have difficulty dressing or bathing?: Yes Independently performs ADLs?: No Does the patient have difficulty walking or climbing stairs?: Yes Weakness of Legs: None Weakness of Arms/Hands: None  Permission Sought/Granted Permission sought to share information with : Case Manager, Magazine features editor, Family Supports Permission granted to share information with : Yes, Verbal Permission Granted        Permission granted to share info w Relationship: Syniece  daughter     Emotional Assessment Appearance:: Appears stated age Attitude/Demeanor/Rapport: Gracious Affect (typically observed): Pleasant Orientation: : Oriented to Self, Oriented to Place, Oriented to  Time, Oriented to Situation Alcohol / Substance Use: Not Applicable Psych Involvement: No (comment)  Admission diagnosis:  GI bleeding [K92.2] Anemia, unspecified type [D64.9] Patient Active Problem List   Diagnosis Date Noted   Anemia due to blood loss, acute 01/29/2022  Upper GI bleed 01/29/2022   GI bleeding 01/28/2022   Cerebrovascular accident (CVA) due to embolism of right middle cerebral artery (HCC) 01/05/2022   Hypotension  due to hypovolemia 01/02/2022   Acute cystitis without hematuria 01/02/2022   High anion gap metabolic acidosis 01/02/2022   Seizures (HCC) 01/02/2022   Shock (HCC) 01/02/2022   Sepsis (HCC) 01/01/2022   Hypovolemic shock (HCC) 01/26/2021   Syncope 09/19/2020   Current chronic use of systemic steroids 09/19/2020   Protein-calorie malnutrition, severe 09/12/2020   Hypokalemia 09/10/2020   Dysphagia 09/10/2020   Diarrhea 09/10/2020   Dehydration 09/10/2020   Transaminitis 09/10/2020   AKI (acute kidney injury) (HCC) 09/10/2020   Lymphocytic colitis 09/10/2020   Rib pain on right side 09/10/2020   SOB (shortness of breath)    PCP:  Raquel James, MD Pharmacy:   [id number] - [place], [place] - [address] [place] [address] Phone: [phone] Fax: [phone]  [facility] [address] [place], [place] - [address] [place] [address] Phone: [phone] Fax: [phone]  [facility] [address] [place] [address] Phone: [phone] Fax: [phone]     Social Determinants of Health (SDOH) Interventions    Readmission Risk Interventions    02/03/2022    2:38 PM  Readmission Risk Prevention Plan  Transportation Screening Complete  PCP or Specialist Appt within 5-7 Days Complete  Home Care Screening Complete  Medication Review (RN CM) Referral to Pharmacy

## 2022-02-04 ENCOUNTER — Inpatient Hospital Stay (HOSPITAL_COMMUNITY): Payer: Medicare Other

## 2022-02-04 DIAGNOSIS — K921 Melena: Secondary | ICD-10-CM | POA: Diagnosis not present

## 2022-02-04 LAB — CBC
HCT: 29.1 % — ABNORMAL LOW (ref 36.0–46.0)
Hemoglobin: 9.6 g/dL — ABNORMAL LOW (ref 12.0–15.0)
MCH: 31.6 pg (ref 26.0–34.0)
MCHC: 33 g/dL (ref 30.0–36.0)
MCV: 95.7 fL (ref 80.0–100.0)
Platelets: 86 10*3/uL — ABNORMAL LOW (ref 150–400)
RBC: 3.04 MIL/uL — ABNORMAL LOW (ref 3.87–5.11)
RDW: 17.1 % — ABNORMAL HIGH (ref 11.5–15.5)
WBC: 10.7 10*3/uL — ABNORMAL HIGH (ref 4.0–10.5)
nRBC: 0 % (ref 0.0–0.2)

## 2022-02-04 LAB — BRAIN NATRIURETIC PEPTIDE: B Natriuretic Peptide: 373.3 pg/mL — ABNORMAL HIGH (ref 0.0–100.0)

## 2022-02-04 MED ORDER — IPRATROPIUM-ALBUTEROL 0.5-2.5 (3) MG/3ML IN SOLN: 3.0000 mL | RESPIRATORY_TRACT | Status: DC | PRN

## 2022-02-04 MED ORDER — FUROSEMIDE 10 MG/ML IJ SOLN
20.0000 mg | Freq: Once | INTRAMUSCULAR | Status: AC
  Administered 2022-02-04: 20 mg via INTRAVENOUS
  Filled 2022-02-04: qty 2

## 2022-02-04 NOTE — Progress Notes (Signed)
PROGRESS NOTE    Wanda Valdez  [id number] DOB: 02/23/1946 DOA: 01/28/2022 PCP: Malachy Moan, MD   Brief Narrative:  76 year old female with PMHx of lymphocytic colitis, HTN, recent right shoulder surgery.  She was also recently admitted 01/01/2022 to 01/09/2022.  During that hospitalization she was diagnosed with acute cystitis and acute CVA, started on aspirin and Plavix (for 3 weeks, then aspirin alone).  She also had a EEG that showed evidence of seizures.  She was discharged home with home health.  Since discharge, patient states she is continue to feel ill.   Labs revealed anemia with hemoglobin of 7, heme positive stool.  Patient was referred for admission and GI consulted.  Patient underwent EGD 12/1 which revealed nonbleeding gastric ulcer, gastritis, duodenitis, nonbleeding duodenal ulcer, nonbleeding angiodysplastic lesion.  She was recommended for Protonix 40 mg twice daily for 2 months.  Aspirin has been resumed, okay with GI. MBS performed secondary to dysphagia, recommended dysphagia 2 diet. PT/OT recommending SNF, awaiting placement.     Assessment & Plan:  Principal Problem:   GI bleeding Active Problems:   Dysphagia   Protein-calorie malnutrition, severe   Current chronic use of systemic steroids   Seizures (HCC)   Cerebrovascular accident (CVA) due to embolism of right middle cerebral artery ([place])   Anemia due to blood loss, acute   Upper GI bleed     GI bleeding, symptomatic anemia, acute blood loss anemia Nonbleeding gastric ulcer, gastritis, duodenitis, nonbleeding duodenal ulcer and angioplastic lesion -During the hospitalization patient received unit PRBC transfusion on 11/30.  Hemoglobin has now remained stable around 10.0. S/p EGD 12/1 which revealed nonbleeding gastric ulcer, gastritis, duodenitis, nonbleeding duodenal ulcer, nonbleeding angiodysplastic lesion -Aspirin and Plavix on hold.  Seems to have completed dual antiplatelet therapy for 3 weeks.   She should be on aspirin.  Discussed with Dr. Watt Climes 12/2. Resume aspirin  -PPI twice daily for 2 months, carafate  -Hemoglobin remains stable  Abnormal breath sounds - Diminished breath sounds especially on the right.  X-ray confirmed this morning there is new opacity.  Will check procalcitonin and BNP.  Lasix 20 mg IV ordered.  Advised aggressive use of incentive spirometer and flutter valve.  Out of bed to chair.  As needed bronchodilators.   History of CVA -Aspirin, lipitor    Seizure disorder -Continue Depakote, Vimpat   Dysphagia -Status post MBS.  Now on dysphagia 2 diet   HTN -Lopressor, HCTZ   PT/OT = SNF   DVT prophylaxis: SCDs Start: 01/29/22 0215 Code Status: Full code Family Communication:    Status is: Inpatient Currently awaiting SNF placement   Nutritional status    Signs/Symptoms: meal completion < 25%  Interventions: Ensure Enlive (each supplement provides 350kcal and 20 grams of protein), MVI  Body mass index is 19.17 kg/m.         Subjective: Seen and examined at bedside, overall appears very weak.  Poor inspiratory effort with significant coughing whenever asked to take deep breaths.   Examination:  Constitutional: Not in acute distress.  Appears chronically ill and frail Respiratory: Diminished breath sounds at the bases right greater than left Cardiovascular: Normal sinus rhythm, no rubs Abdomen: Nontender nondistended good bowel sounds Musculoskeletal: No edema noted Skin: No rashes seen Neurologic: CN 2-12 grossly intact.  And nonfocal Psychiatric: Poor judgment and insight.  Alert to name and place  Objective: Vitals:   02/03/22 0442 02/03/22 1409 02/03/22 1956 02/04/22 0546  BP: (!) 158/68 118/64 131/74 (!) 140/75  Pulse:  72 (!) 58 74 70  Resp: 14 16 18    Temp: 98 F (36.7 C) (!) 97.5 F (36.4 C) 98.2 F (36.8 C) (!) 97.4 F (36.3 C)  TempSrc: Oral  Oral Oral  SpO2: 100% 98% 99% 96%  Weight:      Height:         Intake/Output Summary (Last 24 hours) at 02/04/2022 0834 Last data filed at 02/04/2022 0500 Gross per 24 hour  Intake --  Output 350 ml  Net -350 ml   Filed Weights   01/28/22 1708  Weight: 41.6 kg     Data Reviewed:   CBC: Recent Labs  Lab 01/28/22 1713 01/29/22 0544 01/30/22 0257 01/30/22 0556 01/31/22 0641 02/01/22 0540 02/02/22 0431 02/03/22 0608 02/04/22 0655  WBC 8.0  --  9.8  --  9.8 11.3* 9.7  --  10.7*  NEUTROABS 5.4  --   --   --   --   --   --   --   --   HGB 7.0*   < > 10.0*   < > 10.7* 10.4* 11.1* 10.1* 9.6*  HCT 22.2*   < > 30.2*   < > 33.2* 31.4* 33.8* 30.6* 29.1*  MCV 103.7*  --  94.7  --  98.2 94.9 96.6  --  95.7  PLT 245  --  164  --  129* 120* 111*  --  86*   < > = values in this interval not displayed.   Basic Metabolic Panel: Recent Labs  Lab 01/28/22 1713 01/29/22 0544  NA 139 139  K 3.8 3.6  CL 107 112*  CO2 23 21*  GLUCOSE 113* 82  BUN 26* 21  CREATININE 1.18* 0.96  CALCIUM 8.9 8.1*  MG  --  1.8   GFR: Estimated Creatinine Clearance: 31.2 mL/min (by C-G formula based on SCr of 0.96 mg/dL). Liver Function Tests: Recent Labs  Lab 01/28/22 1713  AST 24  ALT 10  ALKPHOS 70  BILITOT 0.6  PROT 6.6  ALBUMIN 2.4*   No results for input(s): "LIPASE", "AMYLASE" in the last 168 hours. No results for input(s): "AMMONIA" in the last 168 hours. Coagulation Profile: Recent Labs  Lab 01/28/22 1713  INR 1.1   Cardiac Enzymes: No results for input(s): "CKTOTAL", "CKMB", "CKMBINDEX", "TROPONINI" in the last 168 hours. BNP (last 3 results) No results for input(s): "PROBNP" in the last 8760 hours. HbA1C: No results for input(s): "HGBA1C" in the last 72 hours. CBG: Recent Labs  Lab 01/28/22 1715  GLUCAP 84   Lipid Profile: No results for input(s): "CHOL", "HDL", "LDLCALC", "TRIG", "CHOLHDL", "LDLDIRECT" in the last 72 hours. Thyroid Function Tests: No results for input(s): "TSH", "T4TOTAL", "FREET4", "T3FREE", "THYROIDAB" in  the last 72 hours. Anemia Panel: No results for input(s): "VITAMINB12", "FOLATE", "FERRITIN", "TIBC", "IRON", "RETICCTPCT" in the last 72 hours. Sepsis Labs: No results for input(s): "PROCALCITON", "LATICACIDVEN" in the last 168 hours.  Recent Results (from the past 240 hour(s))  Resp Panel by RT-PCR (Flu A&B, Covid) Anterior Nasal Swab     Status: None   Collection Time: 01/28/22  5:20 PM   Specimen: Anterior Nasal Swab  Result Value Ref Range Status   SARS Coronavirus 2 by RT PCR NEGATIVE NEGATIVE Final    Comment: (NOTE) SARS-CoV-2 target nucleic acids are NOT DETECTED.  The SARS-CoV-2 RNA is generally detectable in upper respiratory specimens during the acute phase of infection. The lowest concentration of SARS-CoV-2 viral copies this assay can detect is 138  copies/mL. A negative result does not preclude SARS-Cov-2 infection and should not be used as the sole basis for treatment or other patient management decisions. A negative result may occur with  improper specimen collection/handling, submission of specimen other than nasopharyngeal swab, presence of viral mutation(s) within the areas targeted by this assay, and inadequate number of viral copies(<138 copies/mL). A negative result must be combined with clinical observations, patient history, and epidemiological information. The expected result is Negative.  Fact Sheet for Patients:  EntrepreneurPulse.com.au  Fact Sheet for Healthcare Providers:  IncredibleEmployment.be  This test is no t yet approved or cleared by the [place] FDA and  has been authorized for detection and/or diagnosis of SARS-CoV-2 by FDA under an Emergency Use Authorization (EUA). This EUA will remain  in effect (meaning this test can be used) for the duration of the COVID-19 declaration under Section 564(b)(1) of the Act, 21 U.S.C.section 360bbb-3(b)(1), unless the authorization is terminated  or revoked  sooner.       Influenza A by PCR NEGATIVE NEGATIVE Final   Influenza B by PCR NEGATIVE NEGATIVE Final    Comment: (NOTE) The Xpert Xpress SARS-CoV-2/FLU/RSV plus assay is intended as an aid in the diagnosis of influenza from Nasopharyngeal swab specimens and should not be used as a sole basis for treatment. Nasal washings and aspirates are unacceptable for Xpert Xpress SARS-CoV-2/FLU/RSV testing.  Fact Sheet for Patients: EntrepreneurPulse.com.au  Fact Sheet for Healthcare Providers: IncredibleEmployment.be  This test is not yet approved or cleared by the [place] FDA and has been authorized for detection and/or diagnosis of SARS-CoV-2 by FDA under an Emergency Use Authorization (EUA). This EUA will remain in effect (meaning this test can be used) for the duration of the COVID-19 declaration under Section 564(b)(1) of the Act, 21 U.S.C. section 360bbb-3(b)(1), unless the authorization is terminated or revoked.  Performed at [facility], [address] [place], [place] [address]   Urine Culture     Status: Abnormal   Collection Time: 01/28/22  9:14 PM   Specimen: Urine, Clean Catch  Result Value Ref Range Status   Specimen Description   Final    URINE, CLEAN CATCH Performed at [facility], [address] [place], [place] [address]    Special Requests   Final    NONE Performed at [facility], [address] [place], [place] [address]    Culture 70,000 COLONIES/mL ENTEROCOCCUS FAECALIS (A)  Final   Report Status 01/30/2022 FINAL  Final   Organism ID, Bacteria ENTEROCOCCUS FAECALIS (A)  Final      Susceptibility   Enterococcus faecalis - MIC*    AMPICILLIN <=2 SENSITIVE Sensitive     NITROFURANTOIN <=16 SENSITIVE Sensitive     VANCOMYCIN 1 SENSITIVE Sensitive     * 70,000 COLONIES/mL ENTEROCOCCUS FAECALIS         Radiology Studies: DG CHEST PORT 1 VIEW  Result Date:  02/03/2022 CLINICAL DATA:  Shortness of breath. EXAM: PORTABLE CHEST 1 VIEW COMPARISON:  Chest radiograph dated 01/28/2022. FINDINGS: Evaluation is limited due to patient's positioning and rotation. No definite consolidative changes. There is no pleural effusion or pneumothorax. Stable cardiomediastinal silhouette. Atherosclerotic calcification of the aorta. No acute osseous pathology. Degenerative changes of spine and right shoulder arthroplasty. IMPRESSION: No active disease. Electronically Signed   By: Anner Crete M.D.   On: 02/03/2022 23:08        Scheduled Meds:  aspirin EC  81 mg Oral  Daily   atorvastatin  20 mg Oral Daily   divalproex  500 mg Oral Q8H   feeding supplement  237 mL Oral BID BM   hydrochlorothiazide  25 mg Oral Daily   influenza vaccine adjuvanted  0.5 mL Intramuscular Tomorrow-1000   lacosamide  100 mg Oral BID   metoprolol tartrate  12.5 mg Oral BID   multivitamin with minerals  1 tablet Oral Daily   pantoprazole  40 mg Oral BID   pneumococcal 20-valent conjugate vaccine  0.5 mL Intramuscular Tomorrow-1000   sucralfate  1 g Oral TID WC & HS   Continuous Infusions:   LOS: 7 days   Time spent= 35 mins    Neno Hohensee Arsenio Loader, MD [facility]  If 7PM-7AM, please contact night-coverage  02/04/2022, 8:34 AM

## 2022-02-04 NOTE — Progress Notes (Addendum)
Physical Therapy Treatment Patient Details Name: Wanda Valdez MRN: [id number] DOB: 01/05/1946 Today's Date: 02/04/2022   History of Present Illness Wanda Valdez is a 76 year old female with PMHx of lymphocytic colitis, HTN, recent right shoulder surgery.  She was also recently admitted 01/01/2022 to 01/09/2022.  During that hospitalization she was diagnosed with acute cystitis and acute CVA.  Patient admitted for GI bleed.  Patient underwent EGD 12/1 which revealed nonbleeding gastric ulcer, gastritis, duodenitis, nonbleeding duodenal ulcer, nonbleeding angiodysplastic lesion.    PT Comments    Patient found sleeping in bed prior to session with food untouched at bedside. Pt aware she was at a hospital but unable to recall name of hospital or date. Patient minimally verbal, suspected delayed cognitive processing, slow initiation of tasks. Patient continues to decline in progress. Required increased time to guide RLE off of bed to sit EOB, max assist needed to guide LLE off of bed and use of bed pad to guide trunk to an upright postition. Max verbal cues/tactile cues needed for intiation and sequencing in order to perform supine to sit and scoot to EOB. Min assist +1 to power up and steady from elevated bed; Mod assist +2 needed for stand pivot transfer to guide walker and assist guiding LE's. Mod assist +2 needed to lower to [facility] to steady patient as well as lower hips down to [facility]. Mod assist +2 to power up and steady from [facility]. Attempted to ambulate with RW, pt unable to advance ambulation even with therapist progressing RW. Pt stated "it's heavy." Presented with VERY short shuffling steps. Ambulated 15 ft with 2 person hand held assist. Continued to have short shuffling steps however not as pronouced. Patient began drooling out blue liquid, alerted RN. Pt will need ST Rehab at SNF to address mobility and functional decline prior to safely returning home.     Recommendations for follow up  therapy are one component of a multi-disciplinary discharge planning process, led by the attending physician.  Recommendations may be updated based on patient status, additional functional criteria and insurance authorization.  Follow Up Recommendations  Acute inpatient rehab (3hours/day)     Assistance Recommended at Discharge Frequent or constant Supervision/Assistance  Patient can return home with the following A lot of help with walking and/or transfers   Equipment Recommendations  None recommended by PT    Recommendations for Other Services OT consult     Precautions / Restrictions Precautions Precautions: Fall;Shoulder Type of Shoulder Precautions: recent R shoulder surgery, no sling and unable to locate any precautions/restrictions. Pt states surgery was at [facility] in August 2023, Left visual deficits Restrictions Weight Bearing Restrictions: No     Mobility  Bed Mobility Overal bed mobility: Needs Assistance Bed Mobility: Supine to Sit     Supine to sit: Max assist     General bed mobility comments: Pt required increased time to guide RLE off of bed, assist needed to guide LLE off of bed and use of bed pad to guide trunk to an upright postition. Max verbal cues/tactile cues needed for intiation and sequencing in order to perform supine to sit and scoot to EOB.    Transfers Overall transfer level: Needs assistance Equipment used: Rolling walker (2 wheels) Transfers: Sit to/from Stand, Bed to chair/wheelchair/BSC Sit to Stand: Mod assist, From elevated surface, Min assist, +2 physical assistance Stand pivot transfers: Max assist, +2 physical assistance         General transfer comment: Min assist +1 to power up and steady from  elevated bed; Mod assist +2 needed for stand pivot transfer to guide walker and assist guiding LE's. Mod assist +2 needed to lower to [facility] to steady patient as well as lower hips down to [facility]. Mod assist +2 to power up and steady from [facility].     Ambulation/Gait Ambulation/Gait assistance: Mod assist Gait Distance (Feet): 15 Feet Assistive device: 2 person hand held assist Gait Pattern/deviations: Step-to pattern, Shuffle Gait velocity: decr     General Gait Details: Attempted to ambulate with RW, pt unable to advance ambulation even with therapist progressing RW. Pt stated "it's heavy." Presented with VERY short shuffling steps. Ambulated 15 ft with 2 person hand held assist. Continued to have short shuffling steps however not as pronouced.   Stairs             Wheelchair Mobility    Modified Rankin (Stroke Patients Only)       Balance                                            Cognition Arousal/Alertness: Lethargic Behavior During Therapy: Flat affect Overall Cognitive Status: No family/caregiver present to determine baseline cognitive functioning                                 General Comments: patient minimally verbal, suspected delayed cognitive processing, slow initiation of tasks        Exercises      General Comments        Pertinent Vitals/Pain Pain Assessment Pain Assessment: Faces Faces Pain Scale: Hurts little more Pain Location: R shoulder grimacing with some movements, but not all Pain Descriptors / Indicators: Grimacing Pain Intervention(s): Monitored during session, Repositioned    Home Living                          Prior Function            PT Goals (current goals can now be found in the care plan section) Acute Rehab PT Goals Patient Stated Goal: none stated PT Goal Formulation: With patient Time For Goal Achievement: 02/15/22 Potential to Achieve Goals: Good Progress towards PT goals: Progressing toward goals    Frequency    Min 4X/week      PT Plan Current plan remains appropriate    Co-evaluation              AM-PAC PT "6 Clicks" Mobility   Outcome Measure  Help needed turning from your back to your  side while in a flat bed without using bedrails?: Total Help needed moving from lying on your back to sitting on the side of a flat bed without using bedrails?: Total Help needed moving to and from a bed to a chair (including a wheelchair)?: A Lot Help needed standing up from a chair using your arms (e.g., wheelchair or bedside chair)?: A Lot Help needed to walk in hospital room?: A Lot Help needed climbing 3-5 steps with a railing? : Total 6 Click Score: 9    End of Session Equipment Utilized During Treatment: Gait belt Activity Tolerance: Patient limited by fatigue Patient left: in chair;with call bell/phone within reach;with chair alarm set Nurse Communication: Mobility status PT Visit Diagnosis: Unsteadiness on feet (R26.81);Muscle weakness (generalized) (M62.81)     Time:  7482-7078 PT Time Calculation (min) (ACUTE ONLY): 24 min  Charges:  $Gait Training: 8-22 mins $Therapeutic Activity: 8-22 mins                      Naviyah Schaffert 02/04/2022, 3:06 PM

## 2022-02-04 NOTE — Progress Notes (Signed)
Pt with new congested cough this shift. Provider notified and cxray ordered. See results. Pt reported it being "a little harder to breathe" but O2 98%. Provider also notified. Pt remains in elevated sitting position in bed. Bed locked and in lowest position. Plan of care continues.

## 2022-02-04 NOTE — Progress Notes (Signed)
Speech Language Pathology Treatment: Dysphagia  Patient Details Name: Wanda Valdez MRN: [id number] DOB: 12-Jul-1945 Today's Date: 02/04/2022 Time: 4709-6283 SLP Time Calculation (min) (ACUTE ONLY): 47 min  Assessment / Plan / Recommendation Clinical Impression  Pt seen today to assess po tolerance, readiness for dietary advancment. Greeted pt sitting up in chair - secretions retained in oral cavity and wet voice noted WITHOUT her awareness. SLP searched and located items to set up oral suction due to pt's right lobe pna - likely aspiration.   Today pt willing to consume only small amounts of liquids. She demontrates much worse of a delay in swallow, excessive oral retention of secretions without awareness, and cough with and without po - concerning for aspiration. Significantly increased delay in pt's swallow response with anterior labial spillage on the right.   At times, pt is not eliciting a swallow that is much more exacerbated than during admit or prior hospital stay - presume this dysphagia is due to her right parietal CVA - evolving on most recent brain image 01/28/2022.  SLP also questions cognitive component.   Congested cough noted with thin and nectar twice across approximately 15 trials total. It took pt approx 30 minutes to swallow approx 2 ounces of liquids only. Pt eventually expectorated something resembling a pill *green bluish color* and oral suction required frequently throughout session.   Pt's current level of dysphagia prevents her from receiving adequate hydration and nutrition and now pt has a right lobe pna on CXR - suspect aspiration. Her reflexive cough is weak and not productive elevating pulmonary ramifications.   SLP recommends to change pt to a liquid diet to decrease aspiration - thin via tsp and nectar via cup. Highly recommend palliative consult given her lack of improvement; poor po, and continued decline- over hospital coarse. Having pt hold her small dixie  cup - sitting fully upright and self feeding improved efficiency of her swallow. Pt required oral suction to clear secretions after minimal intake and will need oral suction after all intake.   Prognosis for swallow function to return to functional level is poor in this SLPs opinion, given progressive decline. Feeding tube would not prevent aspiration, thus engagement of palliative may help in elucidating her GOC.  MD and RN made aware and MD reports he will change diet order.     HPI HPI: Patient is a 76 y.o. female with PMH: lymphocytic colitis, HTN, recent right shoulder surgery(one month ago), esophageal stricture s/p EGD with dilation. 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 SLE completed on 01/04/2022 and 01/06/2022.  HH SLP services recommended but no acute level SLP recommended.Pt dc'd and then returned to hospital on 11/29 due to concern for GI bleed - she is on Eliquis.  Swallow eval ordered.  Pt is s/p endoscopy on Friday Dec 1st.  Prior esophagram showed narrowing - cervical and distally 12/2020 and pt is s/p PRIOR endoscopy- empirically dilated to 15 mm proximal.  CXR showed tortulous thoracic aorta.  Medication list includes Carafate, Protonix and depakote sprinkles.  CT head showed evolving right posterior parietal CVA.  Pt endorsed to SLP in initial evaualation recent cough - productive to secretions and she eats soft foods at home. MBS conducted with mod oral and mild pharyngeal deficits.  Pt now with right LL pna diagnosed 12/6 am- and worsening swallow function, mentation status - with increased pooling of secretions.  From prior chart review, pt was doing her laundry early November 2023 prior to CVA -  SLP Plan  Continue with current plan of care      Recommendations for follow up therapy are one component of a multi-disciplinary discharge planning process, led by the attending physician.  Recommendations may be updated based on patient status, additional functional  criteria and insurance authorization.    Recommendations  Diet recommendations: Thin liquid;Nectar-thick liquid Liquids provided via: Cup;No straw;Teaspoon Medication Administration: Via alternative means Supervision: Staff to assist with self feeding;Full supervision/cueing for compensatory strategies Compensations: Minimize environmental distractions;Slow rate;Small sips/bites (oral suction) Postural Changes and/or Swallow Maneuvers: Seated upright 90 degrees;Upright 30-60 min after meal                Oral Care Recommendations: Oral care BID Follow Up Recommendations: Skilled nursing-short term rehab (<3 hours/day) Assistance recommended at discharge: Frequent or constant Supervision/Assistance SLP Visit Diagnosis: Dysphagia, oropharyngeal phase (R13.12);Dysphagia, pharyngoesophageal phase (R13.14) Plan: Continue with current plan of care          Rolena Infante, MS [facility] SLP Acute Rehab Services Office [phone] Pager [phone]  Chales Abrahams  02/04/2022, 5:47 PM

## 2022-02-05 DIAGNOSIS — Z515 Encounter for palliative care: Secondary | ICD-10-CM | POA: Diagnosis not present

## 2022-02-05 DIAGNOSIS — I63411 Cerebral infarction due to embolism of right middle cerebral artery: Secondary | ICD-10-CM | POA: Diagnosis not present

## 2022-02-05 DIAGNOSIS — R1310 Dysphagia, unspecified: Secondary | ICD-10-CM | POA: Diagnosis not present

## 2022-02-05 DIAGNOSIS — E43 Unspecified severe protein-calorie malnutrition: Secondary | ICD-10-CM | POA: Diagnosis not present

## 2022-02-05 DIAGNOSIS — Z7189 Other specified counseling: Secondary | ICD-10-CM

## 2022-02-05 DIAGNOSIS — K921 Melena: Secondary | ICD-10-CM | POA: Diagnosis not present

## 2022-02-05 LAB — BLOOD GAS, VENOUS
Acid-Base Excess: 6.6 mmol/L — ABNORMAL HIGH (ref 0.0–2.0)
Bicarbonate: 30.5 mmol/L — ABNORMAL HIGH (ref 20.0–28.0)
O2 Saturation: 57.7 %
Patient temperature: 36.9
pCO2, Ven: 40 mmHg — ABNORMAL LOW (ref 44–60)
pH, Ven: 7.49 — ABNORMAL HIGH (ref 7.25–7.43)
pO2, Ven: 35 mmHg (ref 32–45)

## 2022-02-05 LAB — CBC
HCT: 33.3 % — ABNORMAL LOW (ref 36.0–46.0)
Hemoglobin: 10.9 g/dL — ABNORMAL LOW (ref 12.0–15.0)
MCH: 32 pg (ref 26.0–34.0)
MCHC: 32.7 g/dL (ref 30.0–36.0)
MCV: 97.7 fL (ref 80.0–100.0)
Platelets: 89 10*3/uL — ABNORMAL LOW (ref 150–400)
RBC: 3.41 MIL/uL — ABNORMAL LOW (ref 3.87–5.11)
RDW: 17.2 % — ABNORMAL HIGH (ref 11.5–15.5)
WBC: 8.6 10*3/uL (ref 4.0–10.5)
nRBC: 0 % (ref 0.0–0.2)

## 2022-02-05 LAB — BASIC METABOLIC PANEL
Anion gap: 9 (ref 5–15)
BUN: 21 mg/dL (ref 8–23)
CO2: 27 mmol/L (ref 22–32)
Calcium: 8.8 mg/dL — ABNORMAL LOW (ref 8.9–10.3)
Chloride: 102 mmol/L (ref 98–111)
Creatinine, Ser: 1.25 mg/dL — ABNORMAL HIGH (ref 0.44–1.00)
GFR, Estimated: 44 mL/min — ABNORMAL LOW (ref >60)
Glucose, Bld: 105 mg/dL — ABNORMAL HIGH (ref 70–99)
Potassium: 3.3 mmol/L — ABNORMAL LOW (ref 3.5–5.1)
Sodium: 138 mmol/L (ref 135–145)

## 2022-02-05 LAB — MAGNESIUM: Magnesium: 1.7 mg/dL (ref 1.7–2.4)

## 2022-02-05 LAB — TSH: TSH: 2.002 u[IU]/mL (ref 0.350–4.500)

## 2022-02-05 LAB — AMMONIA: Ammonia: 11 umol/L (ref 9–35)

## 2022-02-05 LAB — PROCALCITONIN: Procalcitonin: <0.1 ng/mL

## 2022-02-05 LAB — GLUCOSE, CAPILLARY: Glucose-Capillary: 86 mg/dL (ref 70–99)

## 2022-02-05 LAB — BRAIN NATRIURETIC PEPTIDE: B Natriuretic Peptide: 291.1 pg/mL — ABNORMAL HIGH (ref 0.0–100.0)

## 2022-02-05 MED ORDER — POTASSIUM CHLORIDE 10 MEQ/100ML IV SOLN
10.0000 meq | INTRAVENOUS | Status: AC
  Administered 2022-02-05 (×4): 10 meq via INTRAVENOUS
  Filled 2022-02-05: qty 100

## 2022-02-05 MED ORDER — PANTOPRAZOLE SODIUM 40 MG IV SOLR
40.0000 mg | Freq: Once | INTRAVENOUS | Status: AC
  Administered 2022-02-05: 40 mg via INTRAVENOUS
  Filled 2022-02-05: qty 10

## 2022-02-05 MED ORDER — MAGNESIUM SULFATE 2 GM/50ML IV SOLN
2.0000 g | Freq: Once | INTRAVENOUS | Status: AC
  Administered 2022-02-05: 2 g via INTRAVENOUS
  Filled 2022-02-05: qty 50

## 2022-02-05 NOTE — Plan of Care (Signed)
  Problem: Pain Managment: Goal: General experience of comfort will improve Outcome: Progressing   Problem: Safety: Goal: Ability to remain free from injury will improve Outcome: Progressing   

## 2022-02-05 NOTE — TOC Progression Note (Signed)
Transition of Care [facility]) - Progression Note    Patient Details  Name: Taurus Willis MRN: [id number] Date of Birth: October 31, 1945  Transition of Care [facility]) CM/SW Contact  Coralyn Helling, [place] Phone Number: 02/05/2022, 11:00 AM  Clinical Narrative:     Faxed bed offers to daughter, Alene Mires.      Expected Discharge Plan: Skilled Nursing Facility Barriers to Discharge: Continued Medical Work up, SNF Pending bed offer  Expected Discharge Plan and Services Expected Discharge Plan: Skilled Nursing Facility In-house Referral: NA Discharge Planning Services: CM Consult Post Acute Care Choice: Skilled Nursing Facility Living arrangements for the past 2 months: Single Family Home                 DME Arranged: N/A DME Agency: NA       HH Arranged: NA HH Agency: NA         Social Determinants of Health (SDOH) Interventions    Readmission Risk Interventions    02/03/2022    2:38 PM  Readmission Risk Prevention Plan  Transportation Screening Complete  PCP or Specialist Appt within 5-7 Days Complete  Home Care Screening Complete  Medication Review (RN CM) Referral to Pharmacy

## 2022-02-05 NOTE — Consult Note (Signed)
Consultation Note Date: 02/05/2022   Patient Name: Wanda Valdez  DOB: [birth year]  MRN: [id number]  Age / Sex: 76 y.o., female   PCP: Raquel James, MD Referring Physician: Dimple Nanas, MD  Reason for Consultation: Establishing goals of care     Chief Complaint/History of Present Illness:   Patient is a 76 year old female with a past medical history of lymphocytic colitis, hypertension, right shoulder surgery, and CVA diagnosed in 12/2021 to which now she has residual effects from who was admitted on 01/28/2022 as patient has continued to feel ill since the discharge from her hospitalization on 01/09/2022.  During this hospitalization, patient has required transfusion for GI bleeding.  EGD performed on 01/30/2022 revealed nonbleeding gastric ulcers, gastritis, duodenitis, nonbleeding duodenal ulcer, and nonbleeding angioplastic lesion; receiving PPI and Carafate for management.  Patient also developed aspiration pneumonia with worsening breath sounds.  SLP has evaluated the patient and noted that she demonstrates worsening and signs of aspiration including delay in swallow, excessive oral retention of secretions without awareness, and cough with and without oral intake.  Palliative medicine team consulted to assist with complex medical decision making.  Extensive review of EMR prior to seeing patient.  Presented to bedside to introduce myself to patient.  Patient seen laying comfortably in bed.  Patient is confused when seen and can only provide minimal answers.  Patient hyperfocused on changing her bed.  Patient unable t to state next of kin.  While seeing patient, could hear sounds of incomplete secretion management with wet voice and coughing.  No family present at bedside during visit.  Unable to reach family to participate in complex medical decision making discussions as patient is not able to complete in these discussions herself.  Plan to follow-up tomorrow and attempt to get a  hold of family.  Primary Diagnoses  Present on Admission:  GI bleeding  Protein-calorie malnutrition, severe  Cerebrovascular accident (CVA) due to embolism of right middle cerebral artery (HCC)   Palliative Review of Systems: Unable to obtain due to patient's mental status  Past Medical History:  Diagnosis Date   Hypertension    Stroke [facility])    Social History   Socioeconomic History   Marital status: Divorced    Spouse name: Not on file   Number of children: Not on file   Years of education: Not on file   Highest education level: Not on file  Occupational History   Not on file  Tobacco Use   Smoking status: Some Days    Types: Cigarettes   Smokeless tobacco: Never  Vaping Use   Vaping Use: Never used  Substance and Sexual Activity   Alcohol use: Yes    Comment: occassional   Drug use: Never   Sexual activity: Not on file  Other Topics Concern   Not on file  Social History Narrative   Not on file   Social Determinants of Health   Financial Resource Strain: Not on file  Food Insecurity: No Food Insecurity (01/28/2022)   Hunger Vital Sign    Worried About Running Out of Food in the Last Year: Never true    Ran Out of Food in the Last Year: Never true  Transportation Needs: No Transportation Needs (01/28/2022)   PRAPARE - Administrator, Civil Service (Medical): No    Lack of Transportation (Non-Medical): No  Physical Activity: Not on file  Stress: Not on file  Social Connections: Not on file   Family History  Problem Relation Age  of Onset   Cirrhosis Mother    Heart attack Father    Scheduled Meds:  aspirin EC  81 mg Oral Daily   atorvastatin  20 mg Oral Daily   divalproex  500 mg Oral Q8H   feeding supplement  237 mL Oral BID BM   hydrochlorothiazide  25 mg Oral Daily   influenza vaccine adjuvanted  0.5 mL Intramuscular Tomorrow-1000   lacosamide  100 mg Oral BID   metoprolol tartrate  12.5 mg Oral BID   multivitamin with minerals  1  tablet Oral Daily   pantoprazole  40 mg Oral BID   pneumococcal 20-valent conjugate vaccine  0.5 mL Intramuscular Tomorrow-1000   sucralfate  1 g Oral TID WC & HS   Continuous Infusions:  magnesium sulfate bolus IVPB 2 g (02/05/22 1057)   potassium chloride 10 mEq (02/05/22 1055)   PRN Meds:.acetaminophen **OR** acetaminophen, hydrALAZINE, ipratropium-albuterol Allergies  Allergen Reactions   Lidocaine Swelling    Occurred when co-administered with propofol, they were unsure which caused the reaction though likely the lidocaine, if she does have an allergy, as she's tolerated propofol since then.   CBC:    Component Value Date/Time   WBC 8.6 02/05/2022 0654   HGB 10.9 (L) 02/05/2022 0654   HCT 33.3 (L) 02/05/2022 0654   PLT 89 (L) 02/05/2022 0654   MCV 97.7 02/05/2022 0654   NEUTROABS 5.4 01/28/2022 1713   LYMPHSABS 1.7 01/28/2022 1713   MONOABS 0.7 01/28/2022 1713   EOSABS 0.1 01/28/2022 1713   BASOSABS 0.1 01/28/2022 1713   Comprehensive Metabolic Panel:    Component Value Date/Time   NA 138 02/05/2022 0654   K 3.3 (L) 02/05/2022 0654   CL 102 02/05/2022 0654   CO2 27 02/05/2022 0654   BUN 21 02/05/2022 0654   CREATININE 1.25 (H) 02/05/2022 0654   GLUCOSE 105 (H) 02/05/2022 0654   CALCIUM 8.8 (L) 02/05/2022 0654   AST 24 01/28/2022 1713   ALT 10 01/28/2022 1713   ALKPHOS 70 01/28/2022 1713   BILITOT 0.6 01/28/2022 1713   PROT 6.6 01/28/2022 1713   ALBUMIN 2.4 (L) 01/28/2022 1713    Physical Exam: Vital Signs: BP 121/63 (BP Location: Left Arm)   Pulse 71   Temp (!) 97.5 F (36.4 C) (Oral)   Resp 17   Ht 4\' 10"  (1.473 m)   Wt 41.6 kg   SpO2 98%   BMI 19.17 kg/m  SpO2: SpO2: 98 % O2 Device: O2 Device: Room Air O2 Flow Rate: O2 Flow Rate (L/min): 10 L/min Intake/output summary:  Intake/Output Summary (Last 24 hours) at 02/05/2022 1150 Last data filed at 02/04/2022 2317 Gross per 24 hour  Intake --  Output 450 ml  Net -450 ml   LBM: Last BM Date :  02/03/22 Baseline Weight: Weight: 41.6 kg Most recent weight: Weight: 41.6 kg  General: Awake, NAD, muscle wasting present Eyes: No drainage noted HENT: moist mucous membranes Cardiovascular: RRR, no edema in LE b/l Respiratory: no increased work of breathing noted, not in respiratory distress Abdomen: not distended Skin: no rashes or lesions on visible skin Neuro: Unable to participate in complex medical decision making Psych: Pleasant          Palliative Performance Scale: 40%               Additional Data Reviewed: Recent Labs    02/04/22 0655 02/05/22 0654  WBC 10.7* 8.6  HGB 9.6* 10.9*  PLT 86* 89*  NA  --  138  BUN  --  21  CREATININE  --  1.25*    Imaging: DG Chest Port 1 View CLINICAL DATA:  Shortness of breath  EXAM: PORTABLE CHEST 1 VIEW  COMPARISON:  Chest x-ray dated February 03, 2022  FINDINGS: Cardiac and mediastinal contours are unchanged. New right basilar opacity. No large pleural effusion. No evidence of pneumothorax although, patient head position somewhat limits evaluation.  IMPRESSION: New right basilar opacity, concerning for infection or aspiration.  Electronically Signed   By: Allegra Lai M.D.   On: 02/04/2022 09:20    I personally reviewed recent imaging.   Palliative Care Assessment and Plan Summary of Established Goals of Care and Medical Treatment Preferences   Patient is a 76 year old female with a past medical history of lymphocytic colitis, hypertension, right shoulder surgery, and CVA diagnosed in 12/2021 to which now she has residual effects from who was admitted on 01/28/2022 as patient has continued to feel ill since the discharge from her hospitalization on 01/09/2022.  During this hospitalization, patient has required transfusion for GI bleeding.  EGD performed on 01/30/2022 revealed nonbleeding gastric ulcers, gastritis, duodenitis, nonbleeding duodenal ulcer, and nonbleeding angioplastic lesion; receiving PPI and  Carafate for management.  Patient also developed aspiration pneumonia with worsening breath sounds.  SLP has evaluated the patient and noted that she demonstrates worsening and signs of aspiration including delay in swallow, excessive oral retention of secretions without awareness, and cough with and without oral intake.  Palliative medicine team consulted to assist with complex medical decision making.  # Complex medical decision making/goals of care  -Patient unable to dissipate in complex medical decision making due to medical status.  -Appreciate SLPs insight.  Patient has had progressive decline in status since stroke.  Agree and support that feeding tube would not prevent aspiration in patient's situation and would likely cause more harm and risk to patient.  -No family present at bedside today when seeing patient and unable to reach family.  PMT will continue to follow along and attempt to discuss goals for medical care moving forward with family members.  -  Code Status: Full Code   # Symptom management  -As per primary team  # Psycho-social/Spiritual Support:  - Support System: 2 daughters and a sister listed in chart.  EMR documentation notes legal guardian Keilly Fatula, daughter.  Unable to personally review legal guardianship paperwork.  Will continue to follow to determine if daughter does have legal guardianship over patient.  # Discharge Planning: TBD  Thank you for allowing the palliative care team to participate in the care [facility].  Alvester Morin, DO Palliative Care Provider PMT # [phone]  If patient remains symptomatic despite maximum doses, please call PMT at [phone] between 0700 and 1900. Outside of these hours, please call attending, as PMT does not have night coverage.  This provider spent a total of 80 minutes providing patient's care.  Includes review of EMR, discussing care with other staff members involved in patient's medical care,  obtaining relevant history and information from patient and/or patient's family, and personal review of imaging and lab work. Greater than 50% of the time was spent counseling and coordinating care related to the above assessment and plan.

## 2022-02-05 NOTE — Progress Notes (Signed)
PROGRESS NOTE    Wanda Valdez  [id number] DOB: 1945/10/04 DOA: 01/28/2022 PCP: Malachy Moan, MD   Brief Narrative:  76 year old female with PMHx of lymphocytic colitis, HTN, recent right shoulder surgery.  She was also recently admitted 01/01/2022 to 01/09/2022.  During that hospitalization she was diagnosed with acute cystitis and acute CVA, started on aspirin and Plavix (for 3 weeks, then aspirin alone).  She also had a EEG that showed evidence of seizures.  She was discharged home with home health.  Since discharge, patient states she is continue to feel ill.   Labs revealed anemia with hemoglobin of 7, heme positive stool.  Patient was referred for admission and GI consulted.  Patient underwent EGD 12/1 which revealed nonbleeding gastric ulcer, gastritis, duodenitis, nonbleeding duodenal ulcer, nonbleeding angiodysplastic lesion.  She was recommended for Protonix 40 mg twice daily for 2 months.  Aspirin has been resumed, okay with GI. MBS performed secondary to dysphagia, recommended dysphagia 2 diet. PT/OT recommending SNF, awaiting placement.     Assessment & Plan:  Principal Problem:   GI bleeding Active Problems:   Dysphagia   Protein-calorie malnutrition, severe   Current chronic use of systemic steroids   Seizures (HCC)   Cerebrovascular accident (CVA) due to embolism of right middle cerebral artery ([place])   Anemia due to blood loss, acute   Upper GI bleed     GI bleeding, symptomatic anemia, acute blood loss anemia Nonbleeding gastric ulcer, gastritis, duodenitis, nonbleeding duodenal ulcer and angioplastic lesion -During the hospitalization patient received unit PRBC transfusion on 11/30.  Hemoglobin has now remained stable around 10.0. S/p EGD 12/1 which revealed nonbleeding gastric ulcer, gastritis, duodenitis, nonbleeding duodenal ulcer, nonbleeding angiodysplastic lesion -Aspirin and Plavix on hold.  Seems to have completed dual antiplatelet therapy for 3 weeks.   She should be on aspirin.  Discussed with Dr. Watt Climes 12/2. Resume aspirin  -PPI twice daily for 2 months, carafate  -Hemoglobin remains stable  Abnormal breath sounds - Diminished breath sounds especially on the right.  X-ray confirmed this morning there is new opacity.  Procal negative BNP was slightly elevated therefore received a dose of IV Lasix.  Will hold off on further Lasix at this time due to worry about dehydration and slight bump in creatinine.  Hypokalemia/hypomagnesemia - Repletion   History of CVA -Aspirin, lipitor    Seizure disorder -Continue Depakote, Vimpat   Dysphagia -Status post MBS. Worsening on repeat eval 12/6. Changed to Liquid diet with modifications.    HTN -Lopressor, HCTZ   PT/OT = SNF Patient has been on a slow decline especially since her CVA last month. She is currently full code. We will engage Palliative care team for discussions regarding GOC in the setting of generalized decline in overall health and swallowing abilities.    DVT prophylaxis: SCDs Start: 01/29/22 0215 Code Status: Full code Family Communication:  Called [facility].    Status is: Inpatient Currently awaiting SNF placement   Nutritional status    Signs/Symptoms: meal completion < 25%  Interventions: Ensure Enlive (each supplement provides 350kcal and 20 grams of protein), MVI  Body mass index is 19.17 kg/m.      Subjective: More talkative than yesterday but overall still very weak   Examination:  Constitutional: Not in acute distress.  Appears chronically weak and frail Respiratory: bibasilar crackles.  Cardiovascular: Normal sinus rhythm, no rubs Abdomen: Nontender nondistended good bowel sounds Musculoskeletal: No edema noted Skin: No rashes seen Neurologic: CN 2-12 grossly intact.  And nonfocal Psychiatric:  Poor judgment and insight  Objective: Vitals:   02/04/22 1353 02/04/22 1944 02/04/22 1949 02/05/22 0425  BP: 133/69 (!) 95/59 104/82 106/73  Pulse:  71 75 74 71  Resp: 18 20  18   Temp: (!) 97.5 F (36.4 C) 98.2 F (36.8 C)  98 F (36.7 C)  TempSrc: Oral Oral  Oral  SpO2: 100% 98%  99%  Weight:      Height:        Intake/Output Summary (Last 24 hours) at 02/05/2022 0803 Last data filed at 02/04/2022 2317 Gross per 24 hour  Intake --  Output 450 ml  Net -450 ml   Filed Weights   01/28/22 1708  Weight: 41.6 kg     Data Reviewed:   CBC: Recent Labs  Lab 01/30/22 0257 01/30/22 0556 01/31/22 0641 02/01/22 0540 02/02/22 0431 02/03/22 0608 02/04/22 0655  WBC 9.8  --  9.8 11.3* 9.7  --  10.7*  HGB 10.0*   < > 10.7* 10.4* 11.1* 10.1* 9.6*  HCT 30.2*   < > 33.2* 31.4* 33.8* 30.6* 29.1*  MCV 94.7  --  98.2 94.9 96.6  --  95.7  PLT 164  --  129* 120* 111*  --  86*   < > = values in this interval not displayed.   Basic Metabolic Panel: No results for input(s): "NA", "K", "CL", "CO2", "GLUCOSE", "BUN", "CREATININE", "CALCIUM", "MG", "PHOS" in the last 168 hours.  GFR: Estimated Creatinine Clearance: 31.2 mL/min (by C-G formula based on SCr of 0.96 mg/dL). Liver Function Tests: No results for input(s): "AST", "ALT", "ALKPHOS", "BILITOT", "PROT", "ALBUMIN" in the last 168 hours.  No results for input(s): "LIPASE", "AMYLASE" in the last 168 hours. No results for input(s): "AMMONIA" in the last 168 hours. Coagulation Profile: No results for input(s): "INR", "PROTIME" in the last 168 hours.  Cardiac Enzymes: No results for input(s): "CKTOTAL", "CKMB", "CKMBINDEX", "TROPONINI" in the last 168 hours. BNP (last 3 results) No results for input(s): "PROBNP" in the last 8760 hours. HbA1C: No results for input(s): "HGBA1C" in the last 72 hours. CBG: No results for input(s): "GLUCAP" in the last 168 hours.  Lipid Profile: No results for input(s): "CHOL", "HDL", "LDLCALC", "TRIG", "CHOLHDL", "LDLDIRECT" in the last 72 hours. Thyroid Function Tests: No results for input(s): "TSH", "T4TOTAL", "FREET4", "T3FREE", "THYROIDAB" in  the last 72 hours. Anemia Panel: No results for input(s): "VITAMINB12", "FOLATE", "FERRITIN", "TIBC", "IRON", "RETICCTPCT" in the last 72 hours. Sepsis Labs: No results for input(s): "PROCALCITON", "LATICACIDVEN" in the last 168 hours.  Recent Results (from the past 240 hour(s))  Resp Panel by RT-PCR (Flu A&B, Covid) Anterior Nasal Swab     Status: None   Collection Time: 01/28/22  5:20 PM   Specimen: Anterior Nasal Swab  Result Value Ref Range Status   SARS Coronavirus 2 by RT PCR NEGATIVE NEGATIVE Final    Comment: (NOTE) SARS-CoV-2 target nucleic acids are NOT DETECTED.  The SARS-CoV-2 RNA is generally detectable in upper respiratory specimens during the acute phase of infection. The lowest concentration of SARS-CoV-2 viral copies this assay can detect is 138 copies/mL. A negative result does not preclude SARS-Cov-2 infection and should not be used as the sole basis for treatment or other patient management decisions. A negative result may occur with  improper specimen collection/handling, submission of specimen other than nasopharyngeal swab, presence of viral mutation(s) within the areas targeted by this assay, and inadequate number of viral copies(<138 copies/mL). A negative result must be combined with clinical observations,  patient history, and epidemiological information. The expected result is Negative.  Fact Sheet for Patients:  EntrepreneurPulse.com.au  Fact Sheet for Healthcare Providers:  IncredibleEmployment.be  This test is no t yet approved or cleared by the [place] FDA and  has been authorized for detection and/or diagnosis of SARS-CoV-2 by FDA under an Emergency Use Authorization (EUA). This EUA will remain  in effect (meaning this test can be used) for the duration of the COVID-19 declaration under Section 564(b)(1) of the Act, 21 U.S.C.section 360bbb-3(b)(1), unless the authorization is terminated  or revoked  sooner.       Influenza A by PCR NEGATIVE NEGATIVE Final   Influenza B by PCR NEGATIVE NEGATIVE Final    Comment: (NOTE) The Xpert Xpress SARS-CoV-2/FLU/RSV plus assay is intended as an aid in the diagnosis of influenza from Nasopharyngeal swab specimens and should not be used as a sole basis for treatment. Nasal washings and aspirates are unacceptable for Xpert Xpress SARS-CoV-2/FLU/RSV testing.  Fact Sheet for Patients: EntrepreneurPulse.com.au  Fact Sheet for Healthcare Providers: IncredibleEmployment.be  This test is not yet approved or cleared by the [place] FDA and has been authorized for detection and/or diagnosis of SARS-CoV-2 by FDA under an Emergency Use Authorization (EUA). This EUA will remain in effect (meaning this test can be used) for the duration of the COVID-19 declaration under Section 564(b)(1) of the Act, 21 U.S.C. section 360bbb-3(b)(1), unless the authorization is terminated or revoked.  Performed at [facility], [address], [place], [place] [address]   Urine Culture     Status: Abnormal   Collection Time: 01/28/22  9:14 PM   Specimen: Urine, Clean Catch  Result Value Ref Range Status   Specimen Description   Final    URINE, CLEAN CATCH Performed at [facility], [address] [place], [place] [address]    Special Requests   Final    NONE Performed at [facility], [address] [place], [place] [address]    Culture 70,000 COLONIES/mL ENTEROCOCCUS FAECALIS (A)  Final   Report Status 01/30/2022 FINAL  Final   Organism ID, Bacteria ENTEROCOCCUS FAECALIS (A)  Final      Susceptibility   Enterococcus faecalis - MIC*    AMPICILLIN <=2 SENSITIVE Sensitive     NITROFURANTOIN <=16 SENSITIVE Sensitive     VANCOMYCIN 1 SENSITIVE Sensitive     * 70,000 COLONIES/mL ENTEROCOCCUS FAECALIS         Radiology Studies: DG Chest Port 1 View  Result Date:  02/04/2022 CLINICAL DATA:  Shortness of breath EXAM: PORTABLE CHEST 1 VIEW COMPARISON:  Chest x-ray dated February 03, 2022 FINDINGS: Cardiac and mediastinal contours are unchanged. New right basilar opacity. No large pleural effusion. No evidence of pneumothorax although, patient head position somewhat limits evaluation. IMPRESSION: New right basilar opacity, concerning for infection or aspiration. Electronically Signed   By: Yetta Glassman M.D.   On: 02/04/2022 09:20   DG CHEST PORT 1 VIEW  Result Date: 02/03/2022 CLINICAL DATA:  Shortness of breath. EXAM: PORTABLE CHEST 1 VIEW COMPARISON:  Chest radiograph dated 01/28/2022. FINDINGS: Evaluation is limited due to patient's positioning and rotation. No definite consolidative changes. There is no pleural effusion or pneumothorax. Stable cardiomediastinal silhouette. Atherosclerotic calcification of the aorta. No acute osseous pathology. Degenerative changes of spine and right shoulder arthroplasty. IMPRESSION: No active disease. Electronically Signed   By: Anner Crete M.D.   On: 02/03/2022 23:08  Scheduled Meds:  aspirin EC  81 mg Oral Daily   atorvastatin  20 mg Oral Daily   divalproex  500 mg Oral Q8H   feeding supplement  237 mL Oral BID BM   hydrochlorothiazide  25 mg Oral Daily   influenza vaccine adjuvanted  0.5 mL Intramuscular Tomorrow-1000   lacosamide  100 mg Oral BID   metoprolol tartrate  12.5 mg Oral BID   multivitamin with minerals  1 tablet Oral Daily   pantoprazole  40 mg Oral BID   pneumococcal 20-valent conjugate vaccine  0.5 mL Intramuscular Tomorrow-1000   sucralfate  1 g Oral TID WC & HS   Continuous Infusions:   LOS: 8 days   Time spent= 35 mins    Kynslei Art Arsenio Loader, MD [facility]  If 7PM-7AM, please contact night-coverage  02/05/2022, 8:03 AM

## 2022-02-06 ENCOUNTER — Inpatient Hospital Stay (HOSPITAL_COMMUNITY): Payer: Medicare Other

## 2022-02-06 DIAGNOSIS — I63411 Cerebral infarction due to embolism of right middle cerebral artery: Secondary | ICD-10-CM | POA: Diagnosis not present

## 2022-02-06 DIAGNOSIS — D62 Acute posthemorrhagic anemia: Secondary | ICD-10-CM | POA: Diagnosis not present

## 2022-02-06 DIAGNOSIS — R451 Restlessness and agitation: Secondary | ICD-10-CM

## 2022-02-06 DIAGNOSIS — Z79899 Other long term (current) drug therapy: Secondary | ICD-10-CM

## 2022-02-06 DIAGNOSIS — R52 Pain, unspecified: Secondary | ICD-10-CM

## 2022-02-06 DIAGNOSIS — Z7189 Other specified counseling: Secondary | ICD-10-CM | POA: Diagnosis not present

## 2022-02-06 DIAGNOSIS — Z515 Encounter for palliative care: Secondary | ICD-10-CM | POA: Diagnosis not present

## 2022-02-06 DIAGNOSIS — R4589 Other symptoms and signs involving emotional state: Secondary | ICD-10-CM

## 2022-02-06 DIAGNOSIS — K921 Melena: Secondary | ICD-10-CM | POA: Diagnosis not present

## 2022-02-06 LAB — BASIC METABOLIC PANEL
Anion gap: 12 (ref 5–15)
BUN: 20 mg/dL (ref 8–23)
CO2: 21 mmol/L — ABNORMAL LOW (ref 22–32)
Calcium: 8.6 mg/dL — ABNORMAL LOW (ref 8.9–10.3)
Chloride: 100 mmol/L (ref 98–111)
Creatinine, Ser: 1.23 mg/dL — ABNORMAL HIGH (ref 0.44–1.00)
GFR, Estimated: 45 mL/min — ABNORMAL LOW (ref >60)
Glucose, Bld: 84 mg/dL (ref 70–99)
Potassium: 3.7 mmol/L (ref 3.5–5.1)
Sodium: 133 mmol/L — ABNORMAL LOW (ref 135–145)

## 2022-02-06 LAB — GLUCOSE, CAPILLARY
Glucose-Capillary: 80 mg/dL (ref 70–99)
Glucose-Capillary: 90 mg/dL (ref 70–99)
Glucose-Capillary: 96 mg/dL (ref 70–99)

## 2022-02-06 LAB — CBC
HCT: 35 % — ABNORMAL LOW (ref 36.0–46.0)
Hemoglobin: 10.9 g/dL — ABNORMAL LOW (ref 12.0–15.0)
MCH: 31.1 pg (ref 26.0–34.0)
MCHC: 31.1 g/dL (ref 30.0–36.0)
MCV: 100 fL (ref 80.0–100.0)
Platelets: 110 10*3/uL — ABNORMAL LOW (ref 150–400)
RBC: 3.5 MIL/uL — ABNORMAL LOW (ref 3.87–5.11)
RDW: 17.2 % — ABNORMAL HIGH (ref 11.5–15.5)
WBC: 8.2 10*3/uL (ref 4.0–10.5)
nRBC: 0 % (ref 0.0–0.2)

## 2022-02-06 LAB — MAGNESIUM: Magnesium: 2.3 mg/dL (ref 1.7–2.4)

## 2022-02-06 MED ORDER — LORAZEPAM 1 MG PO TABS: 1.0000 mg | ORAL_TABLET | ORAL | Status: DC | PRN

## 2022-02-06 MED ORDER — HALOPERIDOL 0.5 MG PO TABS: 0.5000 mg | ORAL_TABLET | ORAL | Status: DC | PRN

## 2022-02-06 MED ORDER — ACETAMINOPHEN 325 MG PO TABS: 650.0000 mg | ORAL_TABLET | Freq: Four times a day (QID) | ORAL | Status: DC | PRN

## 2022-02-06 MED ORDER — BIOTENE DRY MOUTH MT LIQD: 15.0000 mL | OROMUCOSAL | Status: DC | PRN

## 2022-02-06 MED ORDER — LORAZEPAM 2 MG/ML IJ SOLN: 1.0000 mg | INTRAMUSCULAR | Status: DC | PRN

## 2022-02-06 MED ORDER — HALOPERIDOL LACTATE 5 MG/ML IJ SOLN
0.5000 mg | INTRAMUSCULAR | Status: DC | PRN
  Administered 2022-02-08 – 2022-02-12 (×7): 0.5 mg via INTRAVENOUS
  Filled 2022-02-06 (×7): qty 1

## 2022-02-06 MED ORDER — LORAZEPAM 0.5 MG PO TABS
0.5000 mg | ORAL_TABLET | ORAL | Status: DC | PRN
  Administered 2022-02-08 – 2022-02-09 (×2): 0.5 mg via ORAL
  Filled 2022-02-06 (×2): qty 1

## 2022-02-06 MED ORDER — OXYCODONE HCL 20 MG/ML PO CONC: 5.0000 mg | ORAL | Status: DC | PRN

## 2022-02-06 MED ORDER — OXYCODONE HCL 20 MG/ML PO CONC
2.5000 mg | ORAL | Status: DC | PRN
  Administered 2022-02-14 – 2022-02-15 (×2): 2.6 mg via SUBLINGUAL
  Filled 2022-02-06 (×2): qty 0.5

## 2022-02-06 MED ORDER — HALOPERIDOL LACTATE 2 MG/ML PO CONC: 0.5000 mg | ORAL | Status: DC | PRN

## 2022-02-06 MED ORDER — LORAZEPAM 2 MG/ML PO CONC: 0.5000 mg | ORAL | Status: DC | PRN

## 2022-02-06 MED ORDER — LORAZEPAM 2 MG/ML IJ SOLN
0.5000 mg | INTRAMUSCULAR | Status: DC | PRN
  Administered 2022-02-10 – 2022-02-16 (×9): 0.5 mg via INTRAVENOUS
  Filled 2022-02-06 (×9): qty 1

## 2022-02-06 MED ORDER — GLYCOPYRROLATE 0.2 MG/ML IJ SOLN: 0.2000 mg | INTRAMUSCULAR | Status: DC | PRN

## 2022-02-06 MED ORDER — ONDANSETRON 4 MG PO TBDP: 4.0000 mg | ORAL_TABLET | Freq: Four times a day (QID) | ORAL | Status: DC | PRN

## 2022-02-06 MED ORDER — GLYCOPYRROLATE 1 MG PO TABS: 1.0000 mg | ORAL_TABLET | ORAL | Status: DC | PRN

## 2022-02-06 MED ORDER — GLYCOPYRROLATE 0.2 MG/ML IJ SOLN
0.2000 mg | INTRAMUSCULAR | Status: DC | PRN
  Administered 2022-02-10 – 2022-02-12 (×5): 0.2 mg via INTRAVENOUS
  Filled 2022-02-06 (×5): qty 1

## 2022-02-06 MED ORDER — ONDANSETRON HCL 4 MG/2ML IJ SOLN: 4.0000 mg | Freq: Four times a day (QID) | INTRAMUSCULAR | Status: DC | PRN

## 2022-02-06 MED ORDER — ACETAMINOPHEN 650 MG RE SUPP: 650.0000 mg | Freq: Four times a day (QID) | RECTAL | Status: DC | PRN

## 2022-02-06 MED ORDER — OXYCODONE HCL 5 MG/5ML PO SOLN
2.5000 mg | ORAL | Status: DC | PRN
  Administered 2022-02-09: 2.5 mg via ORAL
  Filled 2022-02-06: qty 5

## 2022-02-06 MED ORDER — HYDROMORPHONE HCL 1 MG/ML IJ SOLN
0.2000 mg | INTRAMUSCULAR | Status: DC | PRN
  Administered 2022-02-09 – 2022-02-17 (×11): 0.2 mg via INTRAVENOUS
  Filled 2022-02-06 (×11): qty 0.5

## 2022-02-06 MED ORDER — LORAZEPAM 2 MG/ML PO CONC: 1.0000 mg | ORAL | Status: DC | PRN

## 2022-02-06 MED ORDER — POLYVINYL ALCOHOL 1.4 % OP SOLN: 1.0000 [drp] | Freq: Four times a day (QID) | OPHTHALMIC | Status: DC | PRN

## 2022-02-06 NOTE — Progress Notes (Signed)
Nutrition Follow-up  DOCUMENTATION CODES:   Severe malnutrition in context of chronic illness  INTERVENTION:  - Full Liquid diet per SLP recs.  - Will increase Ensure Enlive to TID, each supplement provides 350 kcal and 20 grams of protein. - Encourage intake as tolerated. - Continue multivitamin to support micronutrient needs.  - Monitor weight trends.  - Suspect patient will be unable meet needs orally after downgrade to full liquid diet. Patient noted to be confused and not able to answer questions appropriately. Palliative care consulted. Would recommend obtaining nutritional goals of care.   NUTRITION DIAGNOSIS:   Severe Malnutrition related to chronic illness as evidenced by severe fat depletion, severe muscle depletion.  GOAL:   Patient will meet greater than or equal to 90% of their needs *not being met  MONITOR:   PO intake, Supplement acceptance, Weight trends, Diet advancement  REASON FOR ASSESSMENT:   Malnutrition Screening Tool    ASSESSMENT:   76 y.o. female admits related to weakness. PMH includes: lymphocytic colitis, HTN, and recent right shoulder surgery. Pt is currently receiving medical management related to GI bleeding.  Patient awake but disoriented at time of visit, not answering questions appropriately. No visitors at bedside.  Per chart review, patient's diet downgraded to full liquids 12/6 due to worsened dysphagia. She is documented to be consuming only 0-5% of meals the past few days.  Thankfully she is noted to be consuming Ensure, usually twice daily.  Given patient's very poor intake and  She is also very malnourished with severe muscle and fat wasting.  Palliative care is consulted, trying to get in touch with family.   Medications reviewed and include: MVI  Labs reviewed:  Na 133 Creatinine 1.23   NUTRITION - FOCUSED PHYSICAL EXAM:  Flowsheet Row Most Recent Value  Orbital Region Severe depletion  Upper Arm Region Severe  depletion  Thoracic and Lumbar Region Severe depletion  Buccal Region Severe depletion  Temple Region Severe depletion  Clavicle Bone Region Severe depletion  Clavicle and Acromion Bone Region Severe depletion  Scapular Bone Region Unable to assess  Dorsal Hand Severe depletion  Patellar Region Severe depletion  Anterior Thigh Region Severe depletion  Posterior Calf Region Severe depletion  Edema (RD Assessment) None  Hair Reviewed  Eyes Reviewed  Mouth Reviewed  Skin Reviewed  Nails Reviewed       Diet Order:   Diet Order             Diet full liquid Room service appropriate? Yes; Fluid consistency: Nectar Thick  Diet effective now                   EDUCATION NEEDS:  Not appropriate for education at this time  Skin:  Skin Assessment: Reviewed RN Assessment Skin Integrity Issues:: Incisions Incisions: right groin  Last BM:  12/5  Height:  Ht Readings from Last 1 Encounters:  01/28/22 _0  (1.473 m)   Weight:  Wt Readings from Last 1 Encounters:  01/28/22 41.6 kg   BMI:  Body mass index is 19.17 kg/m.  Estimated Nutritional Needs:  Kcal:  1450-1650 kcals Protein:  60-75 grams Fluid:  >/= 1.4L    Samson Frederic RD, LDN For contact information, refer to [facility].

## 2022-02-06 NOTE — Progress Notes (Signed)
DNR band placed on patients R arm

## 2022-02-06 NOTE — Progress Notes (Signed)
Daily Progress Note   Patient Name: Wanda Valdez       Date: 02/06/2022 DOB: [birth year]  Age: 76 y.o. MRN#: [id number] Attending Physician: Dimple Nanas, MD Primary Care Physician: Raquel James, MD Admit Date: 01/28/2022 Length of Stay: 9 days  Reason for Consultation/Follow-up: Establishing goals of care and symptom management  Subjective:   CC: Patient sleeping comfortably when seen today.  Continues to be confused as per EMR review.  Following up regarding complex medical decision making and symptom management.  Subjective:  Discussed care with primary hospitalist prior to seeing patient.  When presenting to bedside to see patient in afternoon, patient sleeping comfortably in bed.  Did not awaken and disturb patient.  As per discussions and EMR review patient has remained confused with minimal to no oral intake.  ------------------------------------------------------------------------------------------------------------- Advance Care Planning Conversation  Pertinent diagnosis: lymphocytic colitis, hypertension, right shoulder surgery, CVA diagnosed in 12/2021 with worsening residual sequelae, aspiration pneumonia  The patient and/or family consented to a voluntary Advance Care Planning Conversation. Individuals present for the conversation: Patient unable to participate in complex medical decision making due to her underlying medical status.  Spoke with patient's daughter, Wanda Valdez, who assist patient in making medical decisions along with patient's other daughter in patient's sister. Wanda Valdez has been providing care at home for patient.  Summary of the conversation:  Later in afternoon able to call patient's daughter, Wanda Valdez.  Able to introduce myself and the role of the palliative medicine team in patient's care.  Daughter able to update me regarding all the medical updates she has heard regarding her mom's care; she has heard the seriousness of her mother's  illness in the setting of her worsening quality from her stroke.  Noted that saw other next of kin listed in chart and daughter Wanda Valdez noted she and her sister and patient's sister all come together to make medical decisions for the patient.  She has been primarily caring for patient when patient was at home. Took time to learn about what patient was like prior to the stroke.  Daughter described her as an incredibly independent person who lives alone, drives, enjoyed cooking, enjoyed time with family, and would even look after her sister's dog during the day.  Inquired what patient's life has been like since the stroke and daughter described the patient has remained confused, has been unable to manage her urinary/bowel habits and was constantly having accidents on herself.  Described patient was not eating and would become incredibly agitated at night.  Described that patient's functional status had greatly decreased.  Acknowledged this change and empathized with difficult situation.  Spent time discussing possible pathways for medical care moving forward based on patient's worsening condition.  Patient has continued to get aggressive medical care though it has not and will not reverse the damage that has been done from her stroke.  Described that instead of aggressive medical management, could instead transition care to focus on patient's comfort moving forward knowing that her status will continue to worsen.  Described patient's continued aspiration even on her own saliva which will continue to cause pneumonia and bring her back to the hospital if aggressive management is continued.  Daughter acknowledged this was not quality of life to the patient. Able to discuss the idea of hospice and the philosophy associated with it.  Described what hospice would and would not provide.  Daughter noted that patient had lived at home alone prior to stroke and would not have 24/7 care at  home.  Discussed instead patient  needing long-term care placement with hospice support.  Daughter did mention that patient has Medicare and Medicaid so noted would inform case manager who could assist with discharge disposition and hopeful long-term care placement with hospice.  Discussed transition to comfort focused care at this moment in the hospital.  Explained this would mean discontinuing interventions that are no longer reversing her underlying disease process such as lab work, imaging, and IV fluids and instead treating signs of pain, dyspnea, and anxiety.  Noted patient would be allowed to eat for comfort.  It would be supported in comfort until the end of her life and would be allowed to have a natural death.  CODE STATUS will be DNR.  Daughter agreed with transition to comfort focused care at this time.  She noted that her family had already been discussing this at home because they were "preparing themselves" with seeing patients deterioration.  Outcome of the conversations and/or documents completed:  Transitioning to comfort focused care at this time.  CODE STATUS will be updated to DNR/DNI.  Seeking long-term care placement with hospice support.  I spent 27 minutes providing separately identifiable ACP services with the patient and/or surrogate decision maker in a voluntary, in-person conversation discussing the patient's wishes and goals as detailed in the above note.  Chelsea Aus, DO Palliative Care Provider  -------------------------------------------------------------------------------------------------------------  Provided emotional support as able.  Noted this provider will continue to follow with patient's journey.  All questions answered at that time.  Thanked daughter for allowing me to talk with her today regarding patient's care.  Review of Systems Patient sleeping comfortably when seen. Objective:   Vital Signs:  BP 120/82 (BP Location: Left Arm)   Pulse 69   Temp (!) 97.3 F (36.3 C) (Oral)    Resp 18   Ht 4\' 10"  (1.473 m)   Wt 41.6 kg   SpO2 95%   BMI 19.17 kg/m   Physical Exam: General: Sleeping, chronically ill-appearing, frail HENT: Dry mucous membranes Cardiovascular: RRR, no edema in LE b/l Respiratory: no increased work of breathing noted, not in respiratory distress Abdomen: not distended Extremities: Muscle wasting present in all extremities Skin: no rashes or lesions on visible skin Neuro: Sleeping comfortably so did not awaken; as per discussions in the EMR review patient has remained confused  Imaging:  I personally reviewed recent imaging.   Assessment & Plan:   Assessment: Patient is a 76 year old female with a past medical history of lymphocytic colitis, hypertension, right shoulder surgery, and CVA diagnosed in 12/2021 to which now she has residual effects from who was admitted on 01/28/2022 as patient has continued to feel ill since the discharge from her hospitalization on 01/09/2022.  During this hospitalization, patient has required transfusion for GI bleeding.  EGD performed on 01/30/2022 revealed nonbleeding gastric ulcers, gastritis, duodenitis, nonbleeding duodenal ulcer, and nonbleeding angioplastic lesion; receiving PPI and Carafate for management.  Patient also developed aspiration pneumonia with worsening breath sounds.  SLP has evaluated the patient and noted that she demonstrates worsening and signs of aspiration including delay in swallow, excessive oral retention of secretions without awareness, and cough with and without oral intake.  Palliative medicine team consulted to assist with complex medical decision making.   Recommendations/Plan: # Complex medical decision making/goals of care:  - Patient unable to participate in complex medical decision making due to her underlying medical status.  - Extensive conversation with patient's daughter, Wanda Valdez, as described above in HPI.  Able to learn about patient's deteriorating quality of  life after stroke.  Patient's quality of life was very important to her.  After discussion, will be transition to comfort focused care at this time.  Will be seeking long-term care placement with hospice support.  -At this time we will discontinue interventions that are no longer focused on comfort such as IV fluids, imaging, or lab work.  Will instead focus on symptom management of pain, dyspnea, and agitation in the setting of end-of-life care.  -  Code Status: DNR   -Updated today as transitioning to comfort focused care. Prognosis: week to short months though patient is at constant risk for a sudden aspiration event which could shorten time further. Appropriate for hospice care with prognosis of <6 months.   # Symptom management:   -Pain/Dyspnea, acute in the setting of end stage dementia for end-of-life care                Patient was not on medications for pain previously.                               -Started IV Dilaudid 0.2 mg IV every 1 hour as needed.  Continue to adjust based on patient's symptom burden.  If patient needing frequent dosing, may need to consider continuous infusion.   -Also started oral/sublingual oxycodone every 2 hours as needed.                  -Anxiety/agitation, in the setting of end-of-life care                               -Started IV/SL/oral Ativan 0.5 mg every 4 hours as needed. Continue to adjust based on patient's symptom burden.                                 -And also started IV Haldol 0.5 mg every 4 hours as needed. Continue to adjust based on patient's symptom burden.                   -Secretions, in the setting of end-of-life care                               -Started IV glycopyrrolate for secretions.  # Psychosocial Support:  - Daughters, sister  # Discharge Planning: Per discussions today, now seeking long-term placement with hospice support.  Discussed with: Hospitalist, TOC, RN, patient's family  Thank you for allowing the palliative care  team to participate in the care Wanda Valdez.  Chelsea Aus, DO Palliative Care Provider PMT # [phone]  If patient remains symptomatic despite maximum doses, please call PMT at [phone] between 0700 and 1900. Outside of these hours, please call attending, as PMT does not have night coverage.

## 2022-02-06 NOTE — Progress Notes (Signed)
PROGRESS NOTE    Wanda Valdez  WCB:[id number] DOB: 1945/08/10 DOA: 01/28/2022 PCP: Raquel James, MD   Brief Narrative:  76 year old female with PMHx of lymphocytic colitis, HTN, recent right shoulder surgery.  She was also recently admitted 01/01/2022 to 01/09/2022.  During that hospitalization she was diagnosed with acute cystitis and acute CVA, started on aspirin and Plavix (for 3 weeks, then aspirin alone).  She also had a EEG that showed evidence of seizures.  She was discharged home with home health.  Since discharge, patient states she is continue to feel ill.   Labs revealed anemia with hemoglobin of 7, heme positive stool.  Patient was referred for admission and GI consulted.  Patient underwent EGD 12/1 which revealed nonbleeding gastric ulcer, gastritis, duodenitis, nonbleeding duodenal ulcer, nonbleeding angiodysplastic lesion.  She was recommended for Protonix 40 mg twice daily for 2 months.  Aspirin has been resumed, okay with GI. MBS performed secondary to dysphagia, recommended dysphagia 2 diet. PT/OT recommending SNF, awaiting placement.     Assessment & Plan:  Principal Problem:   GI bleeding Active Problems:   Dysphagia   Protein-calorie malnutrition, severe   Current chronic use of systemic steroids   Seizures (HCC)   Cerebrovascular accident (CVA) due to embolism of right middle cerebral artery (HCC)   Anemia due to blood loss, acute   Upper GI bleed   Palliative care encounter   Goals of care, counseling/discussion   Counseling and coordination of care     GI bleeding, symptomatic anemia, acute blood loss anemia Nonbleeding gastric ulcer, gastritis, duodenitis, nonbleeding duodenal ulcer and angioplastic lesion -During the hospitalization patient received unit PRBC transfusion on 11/30.  Hemoglobin has now remained stable around 10.0. S/p EGD 12/1 which revealed nonbleeding gastric ulcer, gastritis, duodenitis, nonbleeding duodenal ulcer, nonbleeding  angiodysplastic lesion -Aspirin and Plavix on hold.  Seems to have completed dual antiplatelet therapy for 3 weeks.  She should be on aspirin.  Discussed with Dr. Ewing Schlein 12/2. Resume aspirin  -PPI twice daily for 2 months, carafate  -Hemoglobin remains stable  Abnormal breath sounds - X-ray confirmed new right-sided opacity concerns of aspiration..  Procal negative BNP was slightly elevated therefore received a dose of IV Lasix.  Will hold off on giving further Lasix.  She has very poor oral intake Stop HCTZ  Encephalopathy/lethargy - All the metabolic workup has been negative.  TSH, ammonia levels, normal.  Repeat MRI brain this morning does not show any further acute pathology.  This is overall gross decline in her condition.  Palliative care team is following.  Will check UA  Hypokalemia/hypomagnesemia - Repletion   History of CVA -Aspirin, lipitor.  Repeat MRI shows stable previous stroke   Seizure disorder -Continue Depakote, Vimpat   Dysphagia -Status post MBS. Worsening on repeat eval 12/6.  Her diet was changed to liquid but seems like she is also having trouble tolerating this.  She should really be comfort feeds at this point   HTN -Lopressor, stop HCTZ   PT/OT = SNF Patient has been on a slow decline especially since her CVA last month. She is currently full code. We will engage Palliative care team for discussions regarding GOC in the setting of generalized decline in overall health and swallowing abilities.    DVT prophylaxis: SCDs Start: 01/29/22 0215 Code Status: Full code Family Communication:  Called Syniece- updated.   Status is: Inpatient Currently awaiting SNF placement   Nutritional status    Signs/Symptoms: meal completion < 25%  Interventions: Ensure  Enlive (each supplement provides 350kcal and 20 grams of protein), MVI  Body mass index is 19.17 kg/m.      Subjective: Drowsy, lethargic. Poor po intake.   Examination: Constitutional: Not  in acute distress. Chronically weak frail. Respiratory: Clear to auscultation bilaterally Cardiovascular: Normal sinus rhythm, no rubs Abdomen: Nontender nondistended good bowel sounds Musculoskeletal: No edema noted Skin: No rashes seen Neurologic: CN 2-12 grossly intact.  And nonfocal Psychiatric: Poor judgement and insight.    Objective: Vitals:   02/05/22 1501 02/05/22 2133 02/06/22 0558 02/06/22 0940  BP: 138/69 112/75 120/82 (!) 144/72  Pulse: 65 67 69 71  Resp: 18 18 18    Temp: (!) 97.5 F (36.4 C) 98 F (36.7 C) (!) 97.3 F (36.3 C)   TempSrc: Oral Oral Oral   SpO2: 96% 95% 95% 98%  Weight:      Height:        Intake/Output Summary (Last 24 hours) at 02/06/2022 1147 Last data filed at 02/06/2022 0604 Gross per 24 hour  Intake --  Output 500 ml  Net -500 ml   Filed Weights   01/28/22 1708  Weight: 41.6 kg     Data Reviewed:   CBC: Recent Labs  Lab 02/01/22 0540 02/02/22 0431 02/03/22 0608 02/04/22 0655 02/05/22 0654 02/06/22 0650  WBC 11.3* 9.7  --  10.7* 8.6 8.2  HGB 10.4* 11.1* 10.1* 9.6* 10.9* 10.9*  HCT 31.4* 33.8* 30.6* 29.1* 33.3* 35.0*  MCV 94.9 96.6  --  95.7 97.7 100.0  PLT 120* 111*  --  86* 89* 110*   Basic Metabolic Panel: Recent Labs  Lab 02/05/22 0654 02/06/22 0650  NA 138 133*  K 3.3* 3.7  CL 102 100  CO2 27 21*  GLUCOSE 105* 84  BUN 21 20  CREATININE 1.25* 1.23*  CALCIUM 8.8* 8.6*  MG 1.7 2.3    GFR: Estimated Creatinine Clearance: 24.3 mL/min (A) (by C-G formula based on SCr of 1.23 mg/dL (H)). Liver Function Tests: No results for input(s): "AST", "ALT", "ALKPHOS", "BILITOT", "PROT", "ALBUMIN" in the last 168 hours.  No results for input(s): "LIPASE", "AMYLASE" in the last 168 hours. Recent Labs  Lab 02/05/22 1907  AMMONIA 11   Coagulation Profile: No results for input(s): "INR", "PROTIME" in the last 168 hours.  Cardiac Enzymes: No results for input(s): "CKTOTAL", "CKMB", "CKMBINDEX", "TROPONINI" in the last  168 hours. BNP (last 3 results) No results for input(s): "PROBNP" in the last 8760 hours. HbA1C: No results for input(s): "HGBA1C" in the last 72 hours. CBG: Recent Labs  Lab 02/05/22 1855 02/06/22 0039 02/06/22 0603 02/06/22 1134  GLUCAP 86 90 80 96    Lipid Profile: No results for input(s): "CHOL", "HDL", "LDLCALC", "TRIG", "CHOLHDL", "LDLDIRECT" in the last 72 hours. Thyroid Function Tests: Recent Labs    02/05/22 1907  TSH 2.002   Anemia Panel: No results for input(s): "VITAMINB12", "FOLATE", "FERRITIN", "TIBC", "IRON", "RETICCTPCT" in the last 72 hours. Sepsis Labs: Recent Labs  Lab 02/05/22 0654  PROCALCITON <0.10    Recent Results (from the past 240 hour(s))  Resp Panel by RT-PCR (Flu A&B, Covid) Anterior Nasal Swab     Status: None   Collection Time: 01/28/22  5:20 PM   Specimen: Anterior Nasal Swab  Result Value Ref Range Status   SARS Coronavirus 2 by RT PCR NEGATIVE NEGATIVE Final    Comment: (NOTE) SARS-CoV-2 target nucleic acids are NOT DETECTED.  The SARS-CoV-2 RNA is generally detectable in upper respiratory specimens during the acute  phase of infection. The lowest concentration of SARS-CoV-2 viral copies this assay can detect is 138 copies/mL. A negative result does not preclude SARS-Cov-2 infection and should not be used as the sole basis for treatment or other patient management decisions. A negative result may occur with  improper specimen collection/handling, submission of specimen other than nasopharyngeal swab, presence of viral mutation(s) within the areas targeted by this assay, and inadequate number of viral copies(<138 copies/mL). A negative result must be combined with clinical observations, patient history, and epidemiological information. The expected result is Negative.  Fact Sheet for Patients:  BloggerCourse.comhttps://www.fda.gov/media/152166/download  Fact Sheet for Healthcare Providers:  SeriousBroker.ithttps://www.fda.gov/media/152162/download  This test  is no t yet approved or cleared by the [place] FDA and  has been authorized for detection and/or diagnosis of SARS-CoV-2 by FDA under an Emergency Use Authorization (EUA). This EUA will remain  in effect (meaning this test can be used) for the duration of the COVID-19 declaration under Section 564(b)(1) of the Act, 21 U.S.C.section 360bbb-3(b)(1), unless the authorization is terminated  or revoked sooner.       Influenza A by PCR NEGATIVE NEGATIVE Final   Influenza B by PCR NEGATIVE NEGATIVE Final    Comment: (NOTE) The Xpert Xpress SARS-CoV-2/FLU/RSV plus assay is intended as an aid in the diagnosis of influenza from Nasopharyngeal swab specimens and should not be used as a sole basis for treatment. Nasal washings and aspirates are unacceptable for Xpert Xpress SARS-CoV-2/FLU/RSV testing.  Fact Sheet for Patients: BloggerCourse.comhttps://www.fda.gov/media/152166/download  Fact Sheet for Healthcare Providers: SeriousBroker.ithttps://www.fda.gov/media/152162/download  This test is not yet approved or cleared by the [place] FDA and has been authorized for detection and/or diagnosis of SARS-CoV-2 by FDA under an Emergency Use Authorization (EUA). This EUA will remain in effect (meaning this test can be used) for the duration of the COVID-19 declaration under Section 564(b)(1) of the Act, 21 U.S.C. section 360bbb-3(b)(1), unless the authorization is terminated or revoked.  Performed at [facility] [place], [address] [place], [place] [address]   Urine Culture     Status: Abnormal   Collection Time: 01/28/22  9:14 PM   Specimen: Urine, Clean Catch  Result Value Ref Range Status   Specimen Description   Final    URINE, CLEAN CATCH Performed at [facility], [address] [place], [place] [address]    Special Requests   Final    NONE Performed at [facility], [address] [place], [place] [address]    Culture 70,000 COLONIES/mL ENTEROCOCCUS FAECALIS (A)   Final   Report Status 01/30/2022 FINAL  Final   Organism ID, Bacteria ENTEROCOCCUS FAECALIS (A)  Final      Susceptibility   Enterococcus faecalis - MIC*    AMPICILLIN <=2 SENSITIVE Sensitive     NITROFURANTOIN <=16 SENSITIVE Sensitive     VANCOMYCIN 1 SENSITIVE Sensitive     * 70,000 COLONIES/mL ENTEROCOCCUS FAECALIS         Radiology Studies: MR BRAIN WO CONTRAST  Result Date: 02/06/2022 CLINICAL DATA:  TIA. EXAM: MRI HEAD WITHOUT CONTRAST TECHNIQUE: Multiplanar, multiecho pulse sequences of the brain and surrounding structures were obtained without intravenous contrast. COMPARISON:  01/04/22 MRI Brain FINDINGS: Brain: No acute infarction, hemorrhage, hydrocephalus, extra-axial collection or mass lesion. Chronic infarct in the posterior right temporal occipital region. Previously seen infarcts in the right parietal lobe are not visualized. Sequela of mild chronic microvascular ischemic change. Vascular: Normal flow voids. Skull and upper cervical  spine: Normal marrow signal. Sinuses/Orbits: Bilateral lens replacement. Pansinus mucosal thickening with an air-fluid level in the right maxillary sinus. No mastoid effusion. Other: None. IMPRESSION: 1. No acute intracranial abnormality. 2. Paranasal sinus disease with an air-fluid level in the right maxillary sinus. Correlate clinically for acute sinusitis. Electronically Signed   By: Lorenza Cambridge M.D.   On: 02/06/2022 11:04        Scheduled Meds:  aspirin EC  81 mg Oral Daily   atorvastatin  20 mg Oral Daily   divalproex  500 mg Oral Q8H   feeding supplement  237 mL Oral BID BM   hydrochlorothiazide  25 mg Oral Daily   influenza vaccine adjuvanted  0.5 mL Intramuscular Tomorrow-1000   lacosamide  100 mg Oral BID   metoprolol tartrate  12.5 mg Oral BID   multivitamin with minerals  1 tablet Oral Daily   pantoprazole  40 mg Oral BID   pneumococcal 20-valent conjugate vaccine  0.5 mL Intramuscular Tomorrow-1000   Continuous  Infusions:   LOS: 9 days   Time spent= 35 mins    Ulises Wolfinger Joline Maxcy, MD [facility]  If 7PM-7AM, please contact night-coverage  02/06/2022, 11:47 AM

## 2022-02-06 NOTE — TOC Progression Note (Addendum)
Transition of Care [facility]) - Progression Note    Patient Details  Name: Wanda Valdez MRN: [id number] Date of Birth: 1946-02-22  [facility] CM/SW Contact  Adrian Prows, RN Phone Number: 02/06/2022, 2:03 PM  Clinical Narrative:    Contacted by Dr Alvester Morin, Palliative Care that pt's dtr Meredith Pel would like for the pt to go to long term facility placement with hospice support; contacted pt's dtr Syniece [phone]) and she confirms; pt was previously recc for SNF and her dtr says [facility] choice; also pt offered [facility] bed [facility]; however family decision to change to LTC w/ hospice support; notified [facility] at facility of new FL2 to be sent out; will send new FL2 for bed offers, and also find out what hospice is used in the facility.  -1701- new FL2 sent out for bed offers; awaiting bed offers.  Expected Discharge Plan: Skilled Nursing Facility Barriers to Discharge: Continued Medical Work up, SNF Pending bed offer  Expected Discharge Plan and Services Expected Discharge Plan: Skilled Nursing Facility In-house Referral: NA Discharge Planning Services: CM Consult Post Acute Care Choice: Skilled Nursing Facility Living arrangements for the past 2 months: Single Family Home                 DME Arranged: N/A DME Agency: NA       HH Arranged: NA HH Agency: NA         Social Determinants of Health (SDOH) Interventions    Readmission Risk Interventions    02/03/2022    2:38 PM  Readmission Risk Prevention Plan  Transportation Screening Complete  PCP or Specialist Appt within 5-7 Days Complete  Home Care Screening Complete  Medication Review (RN CM) Referral to Pharmacy

## 2022-02-06 NOTE — [place] FL2 (Signed)
[place] MEDICAID FL2 LEVEL OF CARE FORM     IDENTIFICATION  Patient Name: Wanda Valdez Birthdate: Dec 08, 1945 Sex: female Admission Date (Current Location): 01/28/2022  [place] and [place] Number:  Haynes Bast [id number] Facility and Address:  [facility],  [address] [place], [place] [address]      Provider Number: [id number]  Attending Physician Name and Address:  Dimple Nanas, MD  Relative Name and Phone Number:  Calena Salem [phone]    Current Level of Care: Hospital Recommended Level of Care: Other (Comment) (Long Term Care) Prior Approval Number:    Date Approved/Denied:   PASRR Number: [id number]  Discharge Plan: Other (Comment) (Long Term Care)    Current Diagnoses: Patient Active Problem List   Diagnosis Date Noted   Need for emotional support 02/06/2022   High risk medication use 02/06/2022   Pain 02/06/2022   Agitation 02/06/2022   Palliative care encounter 02/05/2022   Goals of care, counseling/discussion 02/05/2022   Counseling and coordination of care 02/05/2022   Anemia due to blood loss, acute 01/29/2022   Upper GI bleed 01/29/2022   GI bleeding 01/28/2022   Cerebrovascular accident (CVA) due to embolism of right middle cerebral artery (HCC) 01/05/2022   Hypotension due to hypovolemia 01/02/2022   Acute cystitis without hematuria 01/02/2022   High anion gap metabolic acidosis 01/02/2022   Seizures (HCC) 01/02/2022   Shock (HCC) 01/02/2022   Sepsis (HCC) 01/01/2022   Hypovolemic shock (HCC) 01/26/2021   Syncope 09/19/2020   Current chronic use of systemic steroids 09/19/2020   Protein-calorie malnutrition, severe 09/12/2020   Hypokalemia 09/10/2020   Dysphagia 09/10/2020   Diarrhea 09/10/2020   Dehydration 09/10/2020   Transaminitis 09/10/2020   AKI (acute kidney injury) (HCC) 09/10/2020   Lymphocytic colitis 09/10/2020   Rib pain on right side 09/10/2020   SOB (shortness of breath)     Orientation  RESPIRATION BLADDER Height & Weight     Self  Normal Incontinent Weight: 41.6 kg Height:  4\' 10"  (147.3 cm)  BEHAVIORAL SYMPTOMS/MOOD NEUROLOGICAL BOWEL NUTRITION STATUS  Other (Comment) (forgetful) Convulsions/Seizures Incontinent Diet (full liquid)  AMBULATORY STATUS COMMUNICATION OF NEEDS Skin   Limited Assist Verbally Other (Comment), Skin abrasions (abrasions right arm; ecchymosis back; erythema/redness bilateral heels/legs; scratch marks on bilateral legs)                       Personal Care Assistance Level of Assistance  Bathing, Feeding, Dressing Bathing Assistance: Limited assistance Feeding assistance: Limited assistance Dressing Assistance: Limited assistance     Functional Limitations Info  Sight, Hearing, Speech Sight Info: Impaired Hearing Info: Adequate Speech Info: Adequate    SPECIAL CARE FACTORS FREQUENCY  PT (By licensed PT), OT (By licensed OT)     PT Frequency: n/a OT Frequency: n/a            Contractures Contractures Info: Not present    Additional Factors Info  Code Status, Allergies Code Status Info: DNR Allergies Info: Lidocaine Psychotropic Info: see MAR Insulin Sliding Scale Info: see discharge summary Isolation Precautions Info: none Suctioning Needs: n/a   Current Medications (02/06/2022):  This is the current hospital active medication list Current Facility-Administered Medications  Medication Dose Route Frequency Provider Last Rate Last Admin   acetaminophen (TYLENOL) tablet 650 mg  650 mg Oral Q6H PRN Mims, Lauren W, DO       Or   acetaminophen (TYLENOL) suppository 650 mg  650 mg Rectal Q6H PRN M, DO  antiseptic oral rinse (BIOTENE) solution 15 mL  15 mL Topical PRN Mims, Lauren W, DO       divalproex (DEPAKOTE SPRINKLE) capsule 500 mg  500 mg Oral Q8H Dessa Phi, DO   500 mg at 02/06/22 1358   feeding supplement (ENSURE ENLIVE / ENSURE PLUS) liquid 237 mL  237 mL Oral BID BM Dessa Phi, DO   237 mL  at 02/06/22 1359   glycopyrrolate (ROBINUL) tablet 1 mg  1 mg Oral Q4H PRN Mims, Lauren W, DO       Or   glycopyrrolate (ROBINUL) injection 0.2 mg  0.2 mg Subcutaneous Q4H PRN Mims, Lauren W, DO       Or   glycopyrrolate (ROBINUL) injection 0.2 mg  0.2 mg Intravenous Q4H PRN Mims, Lauren W, DO       haloperidol (HALDOL) tablet 0.5 mg  0.5 mg Oral Q4H PRN Mims, Lauren W, DO       Or   haloperidol (HALDOL) 2 MG/ML solution 0.5 mg  0.5 mg Sublingual Q4H PRN Mims, Lauren W, DO       Or   haloperidol lactate (HALDOL) injection 0.5 mg  0.5 mg Intravenous Q4H PRN Mims, Lauren W, DO       hydrALAZINE (APRESOLINE) injection 5 mg  5 mg Intravenous Q4H PRN Danton Clap H, DO   5 mg at 02/01/22 2144   HYDROmorphone (DILAUDID) injection 0.2 mg  0.2 mg Intravenous Q1H PRN Terrilee Files, DO       influenza vaccine adjuvanted (FLUAD) injection 0.5 mL  0.5 mL Intramuscular Tomorrow-1000 Mansy, Jan A, MD       ipratropium-albuterol (DUONEB) 0.5-2.5 (3) MG/3ML nebulizer solution 3 mL  3 mL Nebulization Q4H PRN Amin, Ankit Chirag, MD       lacosamide (VIMPAT) tablet 100 mg  100 mg Oral BID Dessa Phi, DO   100 mg at 02/06/22 0944   LORazepam (ATIVAN) tablet 0.5 mg  0.5 mg Oral Q4H PRN Terrilee Files, DO       Or   LORazepam (ATIVAN) 2 MG/ML concentrated solution 0.5 mg  0.5 mg Sublingual Q4H PRN Mims, Lauren W, DO       Or   LORazepam (ATIVAN) injection 0.5 mg  0.5 mg Intravenous Q4H PRN Mims, Lauren W, DO       ondansetron (ZOFRAN-ODT) disintegrating tablet 4 mg  4 mg Oral Q6H PRN Mims, Lauren W, DO       Or   ondansetron (ZOFRAN) injection 4 mg  4 mg Intravenous Q6H PRN Mims, Lauren W, DO       oxyCODONE (ROXICODONE) 5 MG/5ML solution 2.5 mg  2.5 mg Oral Q2H PRN Mims, Lauren W, DO       Or   oxyCODONE (ROXICODONE INTENSOL) 20 MG/ML concentrated solution 2.6 mg  2.6 mg Sublingual Q2H PRN Mims, Lauren W, DO       pneumococcal 20-valent conjugate vaccine (PREVNAR 20) injection 0.5 mL  0.5 mL  Intramuscular Tomorrow-1000 Danton Clap H, DO       polyvinyl alcohol (LIQUIFILM TEARS) 1.4 % ophthalmic solution 1 drop  1 drop Both Eyes QID PRN Terrilee Files, DO         Discharge Medications: Please see discharge summary for a list of discharge medications.  Relevant Imaging Results:  Relevant Lab Results:   Additional Information [id number]  Henrietta Dine, RN

## 2022-02-07 DIAGNOSIS — K921 Melena: Secondary | ICD-10-CM | POA: Diagnosis not present

## 2022-02-07 NOTE — Progress Notes (Signed)
  Daily Progress Note   Patient Name: Wanda Valdez       Date: 02/07/2022 DOB: 1945-12-13  Age: 76 y.o. MRN#: [id number] Attending Physician: Dimple Nanas, MD Primary Care Physician: Raquel James, MD Admit Date: 01/28/2022 Length of Stay: 10 days  As per discussions with daughter yesterday, patient was transition to comfort focused care on 02/06/2022.  As per EMR review today, has not needed medications to assist with symptom management for pain, dyspnea, or agitation.  Patient continues to remain confused and is likely aspirating.  Appreciate TOC's assistance in coordination of discharge to long-term care facility with hospice support.  Palliative medicine team continuing to follow along with patient's journey at this time.  Thank you for allowing the palliative care team to participate in the care [facility].  Alvester Morin, DO Palliative Care Provider PMT # [phone]

## 2022-02-07 NOTE — Progress Notes (Signed)
PROGRESS NOTE    Wanda Valdez  NWG:[id number] DOB: [birth year] DOA: 01/28/2022 PCP: Raquel James, MD   Brief Narrative:  76 year old female with PMHx of lymphocytic colitis, HTN, recent right shoulder surgery.  She was also recently admitted 01/01/2022 to 01/09/2022.  During that hospitalization she was diagnosed with acute cystitis and acute CVA, started on aspirin and Plavix (for 3 weeks, then aspirin alone).  She also had a EEG that showed evidence of seizures.  She was discharged home with home health.  Since discharge, patient states she is continue to feel ill.   Labs revealed anemia with hemoglobin of 7, heme positive stool.  Patient was referred for admission and GI consulted.  Patient underwent EGD 12/1 which revealed nonbleeding gastric ulcer, gastritis, duodenitis, nonbleeding duodenal ulcer, nonbleeding angiodysplastic lesion.  She was recommended for Protonix 40 mg twice daily for 2 months.  Aspirin has been resumed, okay with GI. MBS performed secondary to dysphagia, recommended dysphagia 2 diet. PT/OT recommending SNF, awaiting placement.  While awaiting placement, patient continued to decline.  Repeat MRI and all the other metabolic workup was negative.  Palliative care team was consulted and patient was transitioned to comfort care.   Assessment & Plan:  Principal Problem:   GI bleeding Active Problems:   Dysphagia   Protein-calorie malnutrition, severe   Current chronic use of systemic steroids   Seizures (HCC)   Cerebrovascular accident (CVA) due to embolism of right middle cerebral artery (HCC)   Anemia due to blood loss, acute   Upper GI bleed   Palliative care encounter   Goals of care, counseling/discussion   Counseling and coordination of care   Need for emotional support   High risk medication use   Pain   Agitation     GI bleeding, symptomatic anemia, acute blood loss anemia Nonbleeding gastric ulcer, gastritis, duodenitis, nonbleeding duodenal ulcer and  angioplastic lesion -During the hospitalization patient received unit PRBC transfusion on 11/30.  Hemoglobin has now remained stable around 10.0. S/p EGD 12/1 which revealed nonbleeding gastric ulcer, gastritis, duodenitis, nonbleeding duodenal ulcer, nonbleeding angiodysplastic lesion -Aspirin and Plavix on hold.  Seems to have completed dual antiplatelet therapy for 3 weeks.  She should be on aspirin.  Discussed with Dr. Ewing Schlein 12/2. Resume aspirin  -PPI twice daily for 2 months, carafate  -Hemoglobin remains stable  Abnormal breath sounds - Unfortunately continues to have recurrent aspiration.  She has now been transitioned to comfort care  Encephalopathy/lethargy - All metabolic workup has been negative including MRI brain.  Patient has now been transitioned to comfort care  Hypokalemia/hypomagnesemia - Repletion   History of CVA -Aspirin, lipitor.  Repeat MRI shows stable previous stroke   Seizure disorder -On Vimpat and Depakote   Dysphagia Diet as tolerated   HTN Medications stopped  Due to progressive decline in her condition without any obvious reversible causes at this time she has been transitioned to comfort care.  Appreciate input from palliative care.   DVT prophylaxis:  Code Status: Full code Family Communication:  Called [facility]- updated.   Status is: Inpatient Patient has been transitioned to comfort care  Nutritional status    Signs/Symptoms: severe fat depletion, severe muscle depletion  Interventions: Ensure Enlive (each supplement provides 350kcal and 20 grams of protein), MVI, Refer to RD note for recommendations  Body mass index is 19.17 kg/m.      Subjective: Patient is awake, having very minimal conversation.  Even while speaking with me while swallowing her saliva she keeps coughing.  Examination:  Constitutional: Not in acute distress, cachectic frail.  Appears chronically ill.  Significant muscle wasting, temporal  wasting Respiratory: Mild bilateral rhonchi Cardiovascular: Normal sinus rhythm, no rubs Abdomen: Nontender nondistended good bowel sounds Musculoskeletal: No edema noted Skin: No rashes seen Neurologic: CN 2-12 grossly intact.  And nonfocal Psychiatric: Alert to name, poor judgment and insight  Objective: Vitals:   02/06/22 0940 02/06/22 1443 02/06/22 2012 02/07/22 0456  BP: (!) 144/72 136/83 (!) 156/96 (!) 143/77  Pulse: 71 65 77 85  Resp:  18 16 18   Temp:  (!) 97.5 F (36.4 C) 97.9 F (36.6 C) 98.3 F (36.8 C)  TempSrc:  Oral Oral Oral  SpO2: 98% 92% 97% 95%  Weight:      Height:       No intake or output data in the 24 hours ending 02/07/22 0834  Filed Weights   01/28/22 1708  Weight: 41.6 kg     Data Reviewed:   CBC: Recent Labs  Lab 02/01/22 0540 02/02/22 0431 02/03/22 0608 02/04/22 0655 02/05/22 0654 02/06/22 0650  WBC 11.3* 9.7  --  10.7* 8.6 8.2  HGB 10.4* 11.1* 10.1* 9.6* 10.9* 10.9*  HCT 31.4* 33.8* 30.6* 29.1* 33.3* 35.0*  MCV 94.9 96.6  --  95.7 97.7 100.0  PLT 120* 111*  --  86* 89* 110*   Basic Metabolic Panel: Recent Labs  Lab 02/05/22 0654 02/06/22 0650  NA 138 133*  K 3.3* 3.7  CL 102 100  CO2 27 21*  GLUCOSE 105* 84  BUN 21 20  CREATININE 1.25* 1.23*  CALCIUM 8.8* 8.6*  MG 1.7 2.3    GFR: Estimated Creatinine Clearance: 24.3 mL/min (A) (by C-G formula based on SCr of 1.23 mg/dL (H)). Liver Function Tests: No results for input(s): "AST", "ALT", "ALKPHOS", "BILITOT", "PROT", "ALBUMIN" in the last 168 hours.  No results for input(s): "LIPASE", "AMYLASE" in the last 168 hours. Recent Labs  Lab 02/05/22 1907  AMMONIA 11   Coagulation Profile: No results for input(s): "INR", "PROTIME" in the last 168 hours.  Cardiac Enzymes: No results for input(s): "CKTOTAL", "CKMB", "CKMBINDEX", "TROPONINI" in the last 168 hours. BNP (last 3 results) No results for input(s): "PROBNP" in the last 8760 hours. HbA1C: No results for  input(s): "HGBA1C" in the last 72 hours. CBG: Recent Labs  Lab 02/05/22 1855 02/06/22 0039 02/06/22 0603 02/06/22 1134  GLUCAP 86 90 80 96    Lipid Profile: No results for input(s): "CHOL", "HDL", "LDLCALC", "TRIG", "CHOLHDL", "LDLDIRECT" in the last 72 hours. Thyroid Function Tests: Recent Labs    02/05/22 1907  TSH 2.002   Anemia Panel: No results for input(s): "VITAMINB12", "FOLATE", "FERRITIN", "TIBC", "IRON", "RETICCTPCT" in the last 72 hours. Sepsis Labs: Recent Labs  Lab 02/05/22 0654  PROCALCITON <0.10    Recent Results (from the past 240 hour(s))  Resp Panel by RT-PCR (Flu A&B, Covid) Anterior Nasal Swab     Status: None   Collection Time: 01/28/22  5:20 PM   Specimen: Anterior Nasal Swab  Result Value Ref Range Status   SARS Coronavirus 2 by RT PCR NEGATIVE NEGATIVE Final    Comment: (NOTE) SARS-CoV-2 target nucleic acids are NOT DETECTED.  The SARS-CoV-2 RNA is generally detectable in upper respiratory specimens during the acute phase of infection. The lowest concentration of SARS-CoV-2 viral copies this assay can detect is 138 copies/mL. A negative result does not preclude SARS-Cov-2 infection and should not be used as the sole basis for treatment or other patient  management decisions. A negative result may occur with  improper specimen collection/handling, submission of specimen other than nasopharyngeal swab, presence of viral mutation(s) within the areas targeted by this assay, and inadequate number of viral copies(<138 copies/mL). A negative result must be combined with clinical observations, patient history, and epidemiological information. The expected result is Negative.  Fact Sheet for Patients:  BloggerCourse.comhttps://www.fda.gov/media/152166/download  Fact Sheet for Healthcare Providers:  SeriousBroker.ithttps://www.fda.gov/media/152162/download  This test is no t yet approved or cleared by the [place] FDA and  has been authorized for detection and/or diagnosis  of SARS-CoV-2 by FDA under an Emergency Use Authorization (EUA). This EUA will remain  in effect (meaning this test can be used) for the duration of the COVID-19 declaration under Section 564(b)(1) of the Act, 21 U.S.C.section 360bbb-3(b)(1), unless the authorization is terminated  or revoked sooner.       Influenza A by PCR NEGATIVE NEGATIVE Final   Influenza B by PCR NEGATIVE NEGATIVE Final    Comment: (NOTE) The Xpert Xpress SARS-CoV-2/FLU/RSV plus assay is intended as an aid in the diagnosis of influenza from Nasopharyngeal swab specimens and should not be used as a sole basis for treatment. Nasal washings and aspirates are unacceptable for Xpert Xpress SARS-CoV-2/FLU/RSV testing.  Fact Sheet for Patients: BloggerCourse.comhttps://www.fda.gov/media/152166/download  Fact Sheet for Healthcare Providers: SeriousBroker.ithttps://www.fda.gov/media/152162/download  This test is not yet approved or cleared by the [place] FDA and has been authorized for detection and/or diagnosis of SARS-CoV-2 by FDA under an Emergency Use Authorization (EUA). This EUA will remain in effect (meaning this test can be used) for the duration of the COVID-19 declaration under Section 564(b)(1) of the Act, 21 U.S.C. section 360bbb-3(b)(1), unless the authorization is terminated or revoked.  Performed at [facility] [place], [address] [place], [place] [address]   Urine Culture     Status: Abnormal   Collection Time: 01/28/22  9:14 PM   Specimen: Urine, Clean Catch  Result Value Ref Range Status   Specimen Description   Final    URINE, CLEAN CATCH Performed at [facility] [place], [address] [place], [place] [address]    Special Requests   Final    NONE Performed at [facility] [place], [address] [place], [place] [address]    Culture 70,000 COLONIES/mL ENTEROCOCCUS FAECALIS (A)  Final   Report Status 01/30/2022 FINAL  Final   Organism ID, Bacteria ENTEROCOCCUS FAECALIS (A)  Final       Susceptibility   Enterococcus faecalis - MIC*    AMPICILLIN <=2 SENSITIVE Sensitive     NITROFURANTOIN <=16 SENSITIVE Sensitive     VANCOMYCIN 1 SENSITIVE Sensitive     * 70,000 COLONIES/mL ENTEROCOCCUS FAECALIS         Radiology Studies: MR BRAIN WO CONTRAST  Result Date: 02/06/2022 CLINICAL DATA:  TIA. EXAM: MRI HEAD WITHOUT CONTRAST TECHNIQUE: Multiplanar, multiecho pulse sequences of the brain and surrounding structures were obtained without intravenous contrast. COMPARISON:  01/04/22 MRI Brain FINDINGS: Brain: No acute infarction, hemorrhage, hydrocephalus, extra-axial collection or mass lesion. Chronic infarct in the posterior right temporal occipital region. Previously seen infarcts in the right parietal lobe are not visualized. Sequela of mild chronic microvascular ischemic change. Vascular: Normal flow voids. Skull and upper cervical spine: Normal marrow signal. Sinuses/Orbits: Bilateral lens replacement. Pansinus mucosal thickening with an air-fluid level in the right maxillary sinus. No mastoid effusion. Other: None. IMPRESSION: 1. No acute intracranial abnormality. 2. Paranasal sinus disease with an air-fluid level  in the right maxillary sinus. Correlate clinically for acute sinusitis. Electronically Signed   By: Lorenza Cambridge M.D.   On: 02/06/2022 11:04        Scheduled Meds:  divalproex  500 mg Oral Q8H   feeding supplement  237 mL Oral BID BM   influenza vaccine adjuvanted  0.5 mL Intramuscular Tomorrow-1000   lacosamide  100 mg Oral BID   pneumococcal 20-valent conjugate vaccine  0.5 mL Intramuscular Tomorrow-1000   Continuous Infusions:   LOS: 10 days   Time spent= 35 mins    Chesnee Floren Joline Maxcy, MD [facility]  If 7PM-7AM, please contact night-coverage  02/07/2022, 8:34 AM

## 2022-02-08 DIAGNOSIS — K921 Melena: Secondary | ICD-10-CM | POA: Diagnosis not present

## 2022-02-08 MED ORDER — GLYCOPYRROLATE 1 MG PO TABS: 1.0000 mg | ORAL_TABLET | ORAL | 0 refills | Status: DC | PRN

## 2022-02-08 MED ORDER — OXYCODONE HCL 20 MG/ML PO CONC: 2.0000 mg | ORAL | 0 refills | Status: DC | PRN

## 2022-02-08 MED ORDER — HALOPERIDOL LACTATE 2 MG/ML PO CONC: 0.6000 mg | ORAL | 0 refills | Status: DC | PRN

## 2022-02-08 MED ORDER — ONDANSETRON 4 MG PO TBDP: 4.0000 mg | ORAL_TABLET | Freq: Four times a day (QID) | ORAL | 0 refills | Status: DC | PRN

## 2022-02-08 MED ORDER — LORAZEPAM 2 MG/ML PO CONC: 0.6000 mg | ORAL | 0 refills | Status: DC | PRN

## 2022-02-08 MED ORDER — DIVALPROEX SODIUM 125 MG PO CSDR: 500.0000 mg | DELAYED_RELEASE_CAPSULE | Freq: Three times a day (TID) | ORAL | 0 refills | Status: DC

## 2022-02-08 MED ORDER — LACOSAMIDE 100 MG PO TABS: 100.0000 mg | ORAL_TABLET | Freq: Two times a day (BID) | ORAL | 0 refills | Status: DC

## 2022-02-08 NOTE — Progress Notes (Signed)
PROGRESS NOTE    Wanda Valdez  YDX:[id number] DOB: [birth year] DOA: 01/28/2022 PCP: Raquel James, MD   Brief Narrative:  76 year old female with PMHx of lymphocytic colitis, HTN, recent right shoulder surgery.  She was also recently admitted 01/01/2022 to 01/09/2022.  During that hospitalization she was diagnosed with acute cystitis and acute CVA, started on aspirin and Plavix (for 3 weeks, then aspirin alone).  She also had a EEG that showed evidence of seizures.  She was discharged home with home health.  Since discharge, patient states she is continue to feel ill.   Labs revealed anemia with hemoglobin of 7, heme positive stool.  Patient was referred for admission and GI consulted.  Patient underwent EGD 12/1 which revealed nonbleeding gastric ulcer, gastritis, duodenitis, nonbleeding duodenal ulcer, nonbleeding angiodysplastic lesion.  She was recommended for Protonix 40 mg twice daily for 2 months.  Aspirin has been resumed, okay with GI. MBS performed secondary to dysphagia, recommended dysphagia 2 diet. PT/OT recommending SNF, awaiting placement.  While awaiting placement, patient continued to decline.  Repeat MRI and all the other metabolic workup was negative.  Palliative care team was consulted and patient was transitioned to comfort care.   Assessment & Plan:  Principal Problem:   GI bleeding Active Problems:   Dysphagia   Protein-calorie malnutrition, severe   Current chronic use of systemic steroids   Seizures (HCC)   Cerebrovascular accident (CVA) due to embolism of right middle cerebral artery (HCC)   Anemia due to blood loss, acute   Upper GI bleed   Palliative care encounter   Goals of care, counseling/discussion   Counseling and coordination of care   Need for emotional support   High risk medication use   Pain   Agitation     GI bleeding, symptomatic anemia, acute blood loss anemia Nonbleeding gastric ulcer, gastritis, duodenitis, nonbleeding duodenal ulcer and  angioplastic lesion -During the hospitalization patient received unit PRBC transfusion on 11/30.  Hemoglobin has now remained stable around 10.0. S/p EGD 12/1 which revealed nonbleeding gastric ulcer, gastritis, duodenitis, nonbleeding duodenal ulcer, nonbleeding angiodysplastic lesion -Aspirin and Plavix on hold.  Seems to have completed dual antiplatelet therapy for 3 weeks.  She should be on aspirin.  Discussed with Dr. Ewing Schlein 12/2. Resume aspirin  -PPI twice daily for 2 months, carafate  -Hemoglobin remains stable  Abnormal breath sounds - Unfortunately continues to have recurrent aspiration.  She has now been transitioned to comfort care  Encephalopathy/lethargy - All metabolic workup has been negative including MRI brain.  Patient has now been transitioned to comfort care  Hypokalemia/hypomagnesemia - Repletion   History of CVA -Aspirin, lipitor.  Repeat MRI shows stable previous stroke   Seizure disorder -On Vimpat and Depakote   Dysphagia Diet as tolerated   HTN Medications stopped  Due to progressive decline in her condition without any obvious reversible causes at this time she has been transitioned to comfort care.  Appreciate input from palliative care.   DVT prophylaxis:  Code Status: Full code Family Communication:  Syniece updated periodically.   Status is: Inpatient Patient has been transitioned to comfort care  Nutritional status    Signs/Symptoms: severe fat depletion, severe muscle depletion  Interventions: Ensure Enlive (each supplement provides 350kcal and 20 grams of protein), MVI, Refer to RD note for recommendations  Body mass index is 19.17 kg/m.      Subjective: Attempting to eat but keeps coughing  Examination: Constitutional: Not in acute distress; elderly frail.  Respiratory: bl rhonchi Cardiovascular:  Normal sinus rhythm, no rubs Abdomen: Nontender nondistended good bowel sounds Musculoskeletal: No edema noted Skin: No rashes  seen Neurologic: CN 2-12 grossly intact.  And nonfocal Psychiatric:  Alert and oriented x 2    Objective: Vitals:   02/06/22 2012 02/07/22 0456 02/07/22 2121 02/08/22 0503  BP: (!) 156/96 (!) 143/77 (!) 143/77 (!) 144/69  Pulse: 77 85 67 65  Resp: 16 18 18 18   Temp: 97.9 F (36.6 C) 98.3 F (36.8 C) (!) 97.5 F (36.4 C) 97.7 F (36.5 C)  TempSrc: Oral Oral Oral Oral  SpO2: 97% 95% 98% 95%  Weight:      Height:        Intake/Output Summary (Last 24 hours) at 02/08/2022 0814 Last data filed at 02/07/2022 1103 Gross per 24 hour  Intake --  Output 150 ml  Net -150 ml    Filed Weights   01/28/22 1708  Weight: 41.6 kg     Data Reviewed:   CBC: Recent Labs  Lab 02/02/22 0431 02/03/22 0608 02/04/22 0655 02/05/22 0654 02/06/22 0650  WBC 9.7  --  10.7* 8.6 8.2  HGB 11.1* 10.1* 9.6* 10.9* 10.9*  HCT 33.8* 30.6* 29.1* 33.3* 35.0*  MCV 96.6  --  95.7 97.7 100.0  PLT 111*  --  86* 89* 110*   Basic Metabolic Panel: Recent Labs  Lab 02/05/22 0654 02/06/22 0650  NA 138 133*  K 3.3* 3.7  CL 102 100  CO2 27 21*  GLUCOSE 105* 84  BUN 21 20  CREATININE 1.25* 1.23*  CALCIUM 8.8* 8.6*  MG 1.7 2.3    GFR: Estimated Creatinine Clearance: 24.3 mL/min (A) (by C-G formula based on SCr of 1.23 mg/dL (H)). Liver Function Tests: No results for input(s): "AST", "ALT", "ALKPHOS", "BILITOT", "PROT", "ALBUMIN" in the last 168 hours.  No results for input(s): "LIPASE", "AMYLASE" in the last 168 hours. Recent Labs  Lab 02/05/22 1907  AMMONIA 11   Coagulation Profile: No results for input(s): "INR", "PROTIME" in the last 168 hours.  Cardiac Enzymes: No results for input(s): "CKTOTAL", "CKMB", "CKMBINDEX", "TROPONINI" in the last 168 hours. BNP (last 3 results) No results for input(s): "PROBNP" in the last 8760 hours. HbA1C: No results for input(s): "HGBA1C" in the last 72 hours. CBG: Recent Labs  Lab 02/05/22 1855 02/06/22 0039 02/06/22 0603 02/06/22 1134   GLUCAP 86 90 80 96    Lipid Profile: No results for input(s): "CHOL", "HDL", "LDLCALC", "TRIG", "CHOLHDL", "LDLDIRECT" in the last 72 hours. Thyroid Function Tests: Recent Labs    02/05/22 1907  TSH 2.002   Anemia Panel: No results for input(s): "VITAMINB12", "FOLATE", "FERRITIN", "TIBC", "IRON", "RETICCTPCT" in the last 72 hours. Sepsis Labs: Recent Labs  Lab 02/05/22 0654  PROCALCITON <0.10    No results found for this or any previous visit (from the past 240 hour(s)).        Radiology Studies: MR BRAIN WO CONTRAST  Result Date: 02/06/2022 CLINICAL DATA:  TIA. EXAM: MRI HEAD WITHOUT CONTRAST TECHNIQUE: Multiplanar, multiecho pulse sequences of the brain and surrounding structures were obtained without intravenous contrast. COMPARISON:  01/04/22 MRI Brain FINDINGS: Brain: No acute infarction, hemorrhage, hydrocephalus, extra-axial collection or mass lesion. Chronic infarct in the posterior right temporal occipital region. Previously seen infarcts in the right parietal lobe are not visualized. Sequela of mild chronic microvascular ischemic change. Vascular: Normal flow voids. Skull and upper cervical spine: Normal marrow signal. Sinuses/Orbits: Bilateral lens replacement. Pansinus mucosal thickening with an air-fluid level in the right  maxillary sinus. No mastoid effusion. Other: None. IMPRESSION: 1. No acute intracranial abnormality. 2. Paranasal sinus disease with an air-fluid level in the right maxillary sinus. Correlate clinically for acute sinusitis. Electronically Signed   By: Lorenza Cambridge M.D.   On: 02/06/2022 11:04        Scheduled Meds:  divalproex  500 mg Oral Q8H   feeding supplement  237 mL Oral BID BM   influenza vaccine adjuvanted  0.5 mL Intramuscular Tomorrow-1000   lacosamide  100 mg Oral BID   pneumococcal 20-valent conjugate vaccine  0.5 mL Intramuscular Tomorrow-1000   Continuous Infusions:   LOS: 11 days   Time spent= 35 mins    Cecilio Ohlrich Joline Maxcy, MD [facility]  If 7PM-7AM, please contact night-coverage  02/08/2022, 8:14 AM

## 2022-02-09 DIAGNOSIS — K921 Melena: Secondary | ICD-10-CM | POA: Diagnosis not present

## 2022-02-09 MED ORDER — GERHARDT'S BUTT CREAM
TOPICAL_CREAM | Freq: Four times a day (QID) | CUTANEOUS | Status: DC
  Filled 2022-02-09 (×2): qty 1

## 2022-02-09 NOTE — Progress Notes (Signed)
PROGRESS NOTE    Wanda Valdez  OJJ:[id number] DOB: [birth year] DOA: 01/28/2022 PCP: Raquel James, MD   Brief Narrative:  76 year old female with PMHx of lymphocytic colitis, HTN, recent right shoulder surgery.  She was also recently admitted 01/01/2022 to 01/09/2022.  During that hospitalization she was diagnosed with acute cystitis and acute CVA, started on aspirin and Plavix (for 3 weeks, then aspirin alone).  She also had a EEG that showed evidence of seizures.  She was discharged home with home health.  Since discharge, patient states she is continue to feel ill.   Labs revealed anemia with hemoglobin of 7, heme positive stool.  Patient was referred for admission and GI consulted.  Patient underwent EGD 12/1 which revealed nonbleeding gastric ulcer, gastritis, duodenitis, nonbleeding duodenal ulcer, nonbleeding angiodysplastic lesion.  She was recommended for Protonix 40 mg twice daily for 2 months.  Aspirin has been resumed, okay with GI. MBS performed secondary to dysphagia, recommended dysphagia 2 diet. PT/OT recommending SNF, awaiting placement.  While awaiting placement, patient continued to decline.  Repeat MRI and all the other metabolic workup was negative.  Palliative care team was consulted and patient was transitioned to comfort care.   Assessment & Plan:  Principal Problem:   GI bleeding Active Problems:   Dysphagia   Protein-calorie malnutrition, severe   Current chronic use of systemic steroids   Seizures (HCC)   Cerebrovascular accident (CVA) due to embolism of right middle cerebral artery (HCC)   Anemia due to blood loss, acute   Upper GI bleed   Palliative care encounter   Goals of care, counseling/discussion   Counseling and coordination of care   Need for emotional support   High risk medication use   Pain   Agitation     GI bleeding, symptomatic anemia, acute blood loss anemia Nonbleeding gastric ulcer, gastritis, duodenitis, nonbleeding duodenal ulcer and  angioplastic lesion -During the hospitalization patient received unit PRBC transfusion on 11/30.  Hemoglobin has now remained stable around 10.0. S/p EGD 12/1 which revealed nonbleeding gastric ulcer, gastritis, duodenitis, nonbleeding duodenal ulcer, nonbleeding angiodysplastic lesion -Aspirin and Plavix on hold.  Seems to have completed dual antiplatelet therapy for 3 weeks.  She should be on aspirin.  Discussed with Dr. Ewing Schlein 12/2. Resume aspirin  -PPI twice daily for 2 months, carafate  -Hemoglobin remains stable  Abnormal breath sounds - Unfortunately continues to have recurrent aspiration.  She has now been transitioned to comfort care  Encephalopathy/lethargy - All metabolic workup has been negative including MRI brain.  Patient has now been transitioned to comfort care  Hypokalemia/hypomagnesemia - Repletion   History of CVA -Aspirin, lipitor.  Repeat MRI shows stable previous stroke   Seizure disorder -On Vimpat and Depakote   Dysphagia Diet as tolerated   HTN Medications stopped  Due to progressive decline in her condition without any obvious reversible causes at this time she has been transitioned to comfort care.  Appreciate input from palliative care.   DVT prophylaxis:  Code Status: Full code Family Communication:  Syniece updated periodically.   Status is: Inpatient Patient has been transitioned to comfort care  Nutritional status    Signs/Symptoms: severe fat depletion, severe muscle depletion  Interventions: Ensure Enlive (each supplement provides 350kcal and 20 grams of protein), MVI, Refer to RD note for recommendations  Body mass index is 19.17 kg/m.      Subjective: Appears comfortable, sitting up in the bed.  Answers all the basic questions Continues to aspirate any time attempts to  take her medications or with p.o. diet Examination: Constitutional: Cachectic frail not in any acute distress Respiratory: Bilateral rhonchi Cardiovascular:  Normal sinus rhythm, no rubs Abdomen: Nontender nondistended good bowel sounds Musculoskeletal: No edema noted Skin: No rashes seen Neurologic: CN 2-12 grossly intact.  And nonfocal Psychiatric:  Alert and oriented x 2    Objective: Vitals:   02/07/22 0456 02/07/22 2121 02/08/22 0503 02/09/22 0513  BP: (!) 143/77 (!) 143/77 (!) 144/69 (!) 140/82  Pulse: 85 67 65 90  Resp: 18 18 18 18   Temp: 98.3 F (36.8 C) (!) 97.5 F (36.4 C) 97.7 F (36.5 C) 99.4 F (37.4 C)  TempSrc: Oral Oral Oral Oral  SpO2: 95% 98% 95% 93%  Weight:      Height:        Intake/Output Summary (Last 24 hours) at 02/09/2022 1218 Last data filed at 02/09/2022 0830 Gross per 24 hour  Intake 10 ml  Output --  Net 10 ml    Filed Weights   01/28/22 1708  Weight: 41.6 kg     Data Reviewed:   CBC: Recent Labs  Lab 02/03/22 0608 02/04/22 0655 02/05/22 0654 02/06/22 0650  WBC  --  10.7* 8.6 8.2  HGB 10.1* 9.6* 10.9* 10.9*  HCT 30.6* 29.1* 33.3* 35.0*  MCV  --  95.7 97.7 100.0  PLT  --  86* 89* 110*   Basic Metabolic Panel: Recent Labs  Lab 02/05/22 0654 02/06/22 0650  NA 138 133*  K 3.3* 3.7  CL 102 100  CO2 27 21*  GLUCOSE 105* 84  BUN 21 20  CREATININE 1.25* 1.23*  CALCIUM 8.8* 8.6*  MG 1.7 2.3    GFR: Estimated Creatinine Clearance: 24.3 mL/min (A) (by C-G formula based on SCr of 1.23 mg/dL (H)). Liver Function Tests: No results for input(s): "AST", "ALT", "ALKPHOS", "BILITOT", "PROT", "ALBUMIN" in the last 168 hours.  No results for input(s): "LIPASE", "AMYLASE" in the last 168 hours. Recent Labs  Lab 02/05/22 1907  AMMONIA 11   Coagulation Profile: No results for input(s): "INR", "PROTIME" in the last 168 hours.  Cardiac Enzymes: No results for input(s): "CKTOTAL", "CKMB", "CKMBINDEX", "TROPONINI" in the last 168 hours. BNP (last 3 results) No results for input(s): "PROBNP" in the last 8760 hours. HbA1C: No results for input(s): "HGBA1C" in the last 72  hours. CBG: Recent Labs  Lab 02/05/22 1855 02/06/22 0039 02/06/22 0603 02/06/22 1134  GLUCAP 86 90 80 96    Lipid Profile: No results for input(s): "CHOL", "HDL", "LDLCALC", "TRIG", "CHOLHDL", "LDLDIRECT" in the last 72 hours. Thyroid Function Tests: No results for input(s): "TSH", "T4TOTAL", "FREET4", "T3FREE", "THYROIDAB" in the last 72 hours.  Anemia Panel: No results for input(s): "VITAMINB12", "FOLATE", "FERRITIN", "TIBC", "IRON", "RETICCTPCT" in the last 72 hours. Sepsis Labs: Recent Labs  Lab 02/05/22 0654  PROCALCITON <0.10    No results found for this or any previous visit (from the past 240 hour(s)).        Radiology Studies: No results found.      Scheduled Meds:  divalproex  500 mg Oral Q8H   feeding supplement  237 mL Oral BID BM   Gerhardt's butt cream   Topical QID   influenza vaccine adjuvanted  0.5 mL Intramuscular Tomorrow-1000   lacosamide  100 mg Oral BID   pneumococcal 20-valent conjugate vaccine  0.5 mL Intramuscular Tomorrow-1000   Continuous Infusions:   LOS: 12 days   Time spent= 35 mins    Catricia Scheerer 14/07/23, MD  [facility]  If 7PM-7AM, please contact night-coverage  02/09/2022, 12:18 PM

## 2022-02-09 NOTE — Care Management Important Message (Signed)
Important Message  Patient Details IM Letter placed in Patient's room. Name: Wanda Valdez MRN: [id number] Date of Birth: [birth year]   Medicare Important Message Given:  Yes     Caren Macadam 02/09/2022, 9:45 AM

## 2022-02-09 NOTE — Progress Notes (Signed)
  Daily Progress Note   Patient Name: Takako Minckler       Date: 02/09/2022 DOB: 27-Jul-1945  Age: 76 y.o. MRN#: [id number] Attending Physician: Dimple Nanas, MD Primary Care Physician: Raquel James, MD Admit Date: 01/28/2022 Length of Stay: 12 days  Discussed care with primary hospitalist today. Patient last seen by this palliative provider on 02/07/22. At that time, patient transition to comfort focused care after discussion with family.  TOC currently assisting with long-term placement with hospice support. As goals for medical care are currently determined, palliative care team will sign off. Please reach out if our team can be of further assistance in the future. Thank you for involving our team in patient's care.    Alvester Morin, DO Palliative Care Provider PMT # [phone]

## 2022-02-09 NOTE — TOC Progression Note (Addendum)
Transition of Care [facility]) - Progression Note    Patient Details  Name: Wanda Valdez MRN: [id number] Date of Birth: 1945/03/17  [facility]) CM/SW Contact  Beckie Busing, RN Phone Number:[phone]  02/09/2022, 12:38 PM  Clinical Narrative:    [facility] acknowledges consult for long term care placement in addition to Hospice support.   1251 CM spoke with daughter to make aware of bed offers. CM is sending list to daughter right now. Will await choice from daughter.   1350 Daughter is agreeable to [facility]. CM called [facility] to confirm that facility can offer long term bed. Per [facility] can not offer patient a long term bed and patient will need to be in SNF for at least 30 days which may not be the case for the patient due to patient now comfort. CM to follow up with Hospice for input.     Expected Discharge Plan: Skilled Nursing Facility Barriers to Discharge: Continued Medical Work up, SNF Pending bed offer  Expected Discharge Plan and Services Expected Discharge Plan: Skilled Nursing Facility In-house Referral: NA Discharge Planning Services: CM Consult Post Acute Care Choice: Skilled Nursing Facility Living arrangements for the past 2 months: Single Family Home                 DME Arranged: N/A DME Agency: NA       HH Arranged: NA HH Agency: NA         Social Determinants of Health (SDOH) Interventions    Readmission Risk Interventions    02/03/2022    2:38 PM  Readmission Risk Prevention Plan  Transportation Screening Complete  PCP or Specialist Appt within 5-7 Days Complete  Home Care Screening Complete  Medication Review (RN CM) Referral to Pharmacy

## 2022-02-10 ENCOUNTER — Inpatient Hospital Stay: Payer: Medicare Other | Admitting: Neurology

## 2022-02-10 DIAGNOSIS — K921 Melena: Secondary | ICD-10-CM | POA: Diagnosis not present

## 2022-02-10 NOTE — TOC Progression Note (Addendum)
Transition of Care [facility]) - Progression Note    Patient Details  Name: Wanda Valdez MRN: [id number] Date of Birth: 05-10-1945  Transition [facility] Care [facility]) CM/SW Contact  Beckie Busing, RN Phone Number:[phone]  02/10/2022, 12:44 PM  Clinical Narrative:    Cm spoke with daughter Wanda Valdez to offer choice for Hospice. Daughter has no specific choice. Hospice referral has been called to [facility].  Daughter states that she is ok with this referral.   1515 CM has reached out to daughter for choices due to no offer from [facility]. Will await response.   Expected Discharge Plan: Skilled Nursing Facility Barriers to Discharge: Continued Medical Work up, SNF Pending bed offer  Expected Discharge Plan and Services Expected Discharge Plan: Skilled Nursing Facility In-house Referral: NA Discharge Planning Services: CM Consult Post Acute Care Choice: Skilled Nursing Facility Living arrangements for the past 2 months: Single Family Home                 DME Arranged: N/A DME Agency: NA       HH Arranged: NA HH Agency: NA         Social Determinants of Health (SDOH) Interventions    Readmission Risk Interventions    02/03/2022    2:38 PM  Readmission Risk Prevention Plan  Transportation Screening Complete  PCP or Specialist Appt within 5-7 Days Complete  Home Care Screening Complete  Medication Review (RN CM) Referral to Pharmacy

## 2022-02-10 NOTE — Progress Notes (Signed)
[employer] [facility]) Hospital Liaison Note  Referral received for patient/family interest in Hospice at LTC. Chart under review by [facility] physician.   Hospice eligibility pending.   Plan is to discharge to SNF for LTC once bed has been offered.   [facility] will continue to follow. Please call with any questions or concerns. Thank you  Dionicio Stall, [facility] Liaison [phone]

## 2022-02-10 NOTE — Progress Notes (Signed)
PROGRESS NOTE    Wanda Valdez  GNF:[id number] DOB: [birth year] DOA: 01/28/2022 PCP: Raquel James, MD   Brief Narrative:  76 year old female with PMHx of lymphocytic colitis, HTN, recent right shoulder surgery.  She was also recently admitted 01/01/2022 to 01/09/2022.  During that hospitalization she was diagnosed with acute cystitis and acute CVA, started on aspirin and Plavix (for 3 weeks, then aspirin alone).  She also had a EEG that showed evidence of seizures.  She was discharged home with home health.  Since discharge, patient states she is continue to feel ill.   Labs revealed anemia with hemoglobin of 7, heme positive stool.  Patient was referred for admission and GI consulted.  Patient underwent EGD 12/1 which revealed nonbleeding gastric ulcer, gastritis, duodenitis, nonbleeding duodenal ulcer, nonbleeding angiodysplastic lesion.  She was recommended for Protonix 40 mg twice daily for 2 months.  Aspirin has been resumed, okay with GI. MBS performed secondary to dysphagia, recommended dysphagia 2 diet. PT/OT recommending SNF, awaiting placement.  While awaiting placement, patient continued to decline.  Repeat MRI and all the other metabolic workup was negative.  Palliative care team was consulted and patient was transitioned to comfort care.   Assessment & Plan:  Principal Problem:   GI bleeding Active Problems:   Dysphagia   Protein-calorie malnutrition, severe   Current chronic use of systemic steroids   Seizures (HCC)   Cerebrovascular accident (CVA) due to embolism of right middle cerebral artery (HCC)   Anemia due to blood loss, acute   Upper GI bleed   Palliative care encounter   Goals of care, counseling/discussion   Counseling and coordination of care   Need for emotional support   High risk medication use   Pain   Agitation     GI bleeding, symptomatic anemia, acute blood loss anemia Nonbleeding gastric ulcer, gastritis, duodenitis, nonbleeding duodenal ulcer and  angioplastic lesion -During the hospitalization patient received unit PRBC transfusion on 11/30.  Hemoglobin has now remained stable around 10.0. S/p EGD 12/1 which revealed nonbleeding gastric ulcer, gastritis, duodenitis, nonbleeding duodenal ulcer, nonbleeding angiodysplastic lesion -Aspirin and Plavix on hold.  Seems to have completed dual antiplatelet therapy for 3 weeks.  She should be on aspirin.  Discussed with Dr. Ewing Schlein 12/2. Resume aspirin  -PPI twice daily for 2 months, carafate  -Hemoglobin remains stable  Abnormal breath sounds - Unfortunately continues to have recurrent aspiration.  She has now been transitioned to comfort care  Encephalopathy/lethargy - Repeat MRI during this admission does not show any progression of her previous stroke.  Now she is comfort care.  Hypokalemia/hypomagnesemia - Repletion   History of CVA -Aspirin, lipitor.  Repeat MRI shows stable previous stroke   Seizure disorder -On Vimpat and Depakote   Dysphagia Diet as tolerated   HTN Medications stopped  Due to progressive decline in her condition without any obvious reversible causes at this time she has been transitioned to comfort care.  Appreciate input from palliative care.   DVT prophylaxis:  Code Status: Full code Family Communication:  Syniece updated periodically.   Status is: Inpatient Patient has been transitioned to comfort care  Nutritional status    Signs/Symptoms: severe fat depletion, severe muscle depletion  Interventions: Ensure Enlive (each supplement provides 350kcal and 20 grams of protein), MVI, Refer to RD note for recommendations  Body mass index is 19.17 kg/m.      Subjective: Patient resting comfortably this morning.  I did not wake her up  Examination: Patient does not appear  to be any distress, she is resting comfortably.  Objective: Vitals:   02/08/22 0503 02/09/22 0513 02/09/22 1358 02/10/22 0515  BP: (!) 144/69 (!) 140/82 116/67 (!) 158/74   Pulse: 65 90 77 76  Resp: 18 18 18 16   Temp: 97.7 F (36.5 C) 99.4 F (37.4 C) 98.1 F (36.7 C) 97.7 F (36.5 C)  TempSrc: Oral Oral Oral Oral  SpO2: 95% 93% 94% 97%  Weight:      Height:        Intake/Output Summary (Last 24 hours) at 02/10/2022 1048 Last data filed at 02/10/2022 0900 Gross per 24 hour  Intake 0 ml  Output --  Net 0 ml    Filed Weights   01/28/22 1708  Weight: 41.6 kg     Data Reviewed:   CBC: Recent Labs  Lab 02/04/22 0655 02/05/22 0654 02/06/22 0650  WBC 10.7* 8.6 8.2  HGB 9.6* 10.9* 10.9*  HCT 29.1* 33.3* 35.0*  MCV 95.7 97.7 100.0  PLT 86* 89* 110*   Basic Metabolic Panel: Recent Labs  Lab 02/05/22 0654 02/06/22 0650  NA 138 133*  K 3.3* 3.7  CL 102 100  CO2 27 21*  GLUCOSE 105* 84  BUN 21 20  CREATININE 1.25* 1.23*  CALCIUM 8.8* 8.6*  MG 1.7 2.3    GFR: Estimated Creatinine Clearance: 24.3 mL/min (A) (by C-G formula based on SCr of 1.23 mg/dL (H)). Liver Function Tests: No results for input(s): "AST", "ALT", "ALKPHOS", "BILITOT", "PROT", "ALBUMIN" in the last 168 hours.  No results for input(s): "LIPASE", "AMYLASE" in the last 168 hours. Recent Labs  Lab 02/05/22 1907  AMMONIA 11   Coagulation Profile: No results for input(s): "INR", "PROTIME" in the last 168 hours.  Cardiac Enzymes: No results for input(s): "CKTOTAL", "CKMB", "CKMBINDEX", "TROPONINI" in the last 168 hours. BNP (last 3 results) No results for input(s): "PROBNP" in the last 8760 hours. HbA1C: No results for input(s): "HGBA1C" in the last 72 hours. CBG: Recent Labs  Lab 02/05/22 1855 02/06/22 0039 02/06/22 0603 02/06/22 1134  GLUCAP 86 90 80 96    Lipid Profile: No results for input(s): "CHOL", "HDL", "LDLCALC", "TRIG", "CHOLHDL", "LDLDIRECT" in the last 72 hours. Thyroid Function Tests: No results for input(s): "TSH", "T4TOTAL", "FREET4", "T3FREE", "THYROIDAB" in the last 72 hours.  Anemia Panel: No results for input(s): "VITAMINB12",  "FOLATE", "FERRITIN", "TIBC", "IRON", "RETICCTPCT" in the last 72 hours. Sepsis Labs: Recent Labs  Lab 02/05/22 0654  PROCALCITON <0.10    No results found for this or any previous visit (from the past 240 hour(s)).        Radiology Studies: No results found.      Scheduled Meds:  divalproex  500 mg Oral Q8H   feeding supplement  237 mL Oral BID BM   Gerhardt's butt cream   Topical QID   influenza vaccine adjuvanted  0.5 mL Intramuscular Tomorrow-1000   lacosamide  100 mg Oral BID   pneumococcal 20-valent conjugate vaccine  0.5 mL Intramuscular Tomorrow-1000   Continuous Infusions:   LOS: 13 days   Time spent= 35 mins    Beronica Lansdale 14/07/23, MD [facility]  If 7PM-7AM, please contact night-coverage  02/10/2022, 10:48 AM

## 2022-02-10 NOTE — Progress Notes (Signed)
Nutrition Brief Note  Chart reviewed. Pt now transitioning to comfort care as of 12/8. No further nutrition interventions planned at this time.    Tilda Franco, MS, RD, LDN Inpatient Clinical Dietitian Contact information available via Amion

## 2022-02-11 DIAGNOSIS — K921 Melena: Secondary | ICD-10-CM | POA: Diagnosis not present

## 2022-02-11 NOTE — TOC Progression Note (Addendum)
Transition of Care [facility]) - Progression Note    Patient Details  Name: Wanda Valdez MRN: [id number] Date of Birth: [birth year]  [facility]) CM/SW Contact  Beckie Busing, RN Phone Number:[phone]  02/11/2022, 11:02 AM  Clinical Narrative:    TOC continues to follow for placement. CM received message from daughter Alene Mires to inquires about [place]. CM has sent message to [facility] and awaiting response. Cm received message from [facility] liaison for [facility] who has been assisting Cm in options for long term SNF placement. The question is if the daughter is willing to accept bed offer in [place]. Currently the patient has community medicaid so a lot of facilities no longer will accept community medicaid  which would be medicaid pending and once placed medicaid would change over to long term medicaid. CM has explained this to daughter and daughter verbalizes understanding.   1112 Daughter states that she will accept bed offer in [place] if all else fails. CM is awaiting confirmation from [facility]'s community liaison.   1518 Cm followed up with Whitney central intake coordinator for [facility]. Per Whitney long term bed is available at [facility], but the facility will need a 30 day LOG because the patient has [employer] (which has no long term benefit)and it will take about 30 days to flip to long term care medicaid and with a patient coming in with hospice referral if the patient were to pass away before the 30 days the facility will not get paid. CM will reach out to [facility] supervisor to determine is this is an option.    Expected Discharge Plan: Skilled Nursing Facility Barriers to Discharge: Continued Medical Work up, SNF Pending bed offer  Expected Discharge Plan and Services Expected Discharge Plan: Skilled Nursing Facility In-house Referral: NA Discharge Planning Services: CM Consult Post Acute Care Choice: Skilled Nursing  Facility Living arrangements for the past 2 months: Single Family Home                 DME Arranged: N/A DME Agency: NA       HH Arranged: NA HH Agency: NA         Social Determinants of Health (SDOH) Interventions    Readmission Risk Interventions    02/03/2022    2:38 PM  Readmission Risk Prevention Plan  Transportation Screening Complete  PCP or Specialist Appt within 5-7 Days Complete  Home Care Screening Complete  Medication Review (RN CM) Referral to Pharmacy

## 2022-02-11 NOTE — Progress Notes (Signed)
PROGRESS NOTE Wanda Valdez  GQQ:[id number] DOB: 1945-06-28 DOA: 01/28/2022 PCP: Raquel James, MD   Brief Narrative/Hospital Course: 76-year-old female with PMHx of lymphocytic colitis, HTN, recent right shoulder surgery.  She was also recently admitted 01/01/2022 to 01/09/2022.  During that hospitalization she was diagnosed with acute cystitis and acute CVA, started on aspirin and Plavix (for 3 weeks, then aspirin alone).  She also had a EEG that showed evidence of seizures.  She was discharged home with home health.  Since discharge, patient states she is continue to feel ill.   Labs revealed anemia with hemoglobin of 7, heme positive stool.  Patient was referred for admission and GI consulted.  Patient underwent EGD 12/1 which revealed nonbleeding gastric ulcer, gastritis, duodenitis, nonbleeding duodenal ulcer, nonbleeding angiodysplastic lesion.  She was recommended for Protonix 40 mg twice daily for 2 months.  Aspirin has been resumed, okay with GI. MBS performed secondary to dysphagia, recommended dysphagia 2 diet. PT/OT recommending SNF, awaiting placement.  While awaiting placement, patient continued to decline.  Repeat MRI and all the other metabolic workup was negative.  Palliative care team was consulted and patient was transitioned to comfort care  Awaiting for placement to SNF with hospice    Subjective: Seen and examined this morning alert awake, mouth appears dry.   Laying in fetal position Complains of generalized pain  Assessment and Plan: Principal Problem:   GI bleeding Active Problems:   Dysphagia   Protein-calorie malnutrition, severe   Current chronic use of systemic steroids   Seizures (HCC)   Cerebrovascular accident (CVA) due to embolism of right middle cerebral artery (HCC)   Anemia due to blood loss, acute   Upper GI bleed   Palliative care encounter   Goals of care, counseling/discussion   Counseling and coordination of care   Need for emotional support    High risk medication use   Pain   Agitation  Comfort measures: Patient had progressive decline in her condition without reversible cause, seen by palliative care subsequently transition to comfort measures.  At this time waiting for SNF with hospice care   Acute GI bleeding Acute blood loss anemia Nonbleeding gastric ulcer/gastritis/duodenitis/nonbleeding internal ulcer and angioplastic lesion: Patient was seen by GI managed with blood transfusion supportive care.  Underwent EGD 12/1.  Aspirin Plavix discontinued> and resumed on 12/2, continue PPI twice daily Carafate, Hb stable at this time.  Concern for recurrent aspiration: Now on comfort measures.  Acute metabolic encephalopathy/lethargy: Repeat MRI during this admission did not show progression of her previous stroke.  Now being managed with comfort measures  Hypokalemia/hypomagnesemia:Repleted history of IZT:IWPYKD MRI shows stable previous stroke Seizure disorder: was on vimpat and Depakote Dysphagia:Diet as tolerated HTN-stable Severe protein calorie malnutrition with failure to thrive Nutrition Problem: Severe Malnutrition Etiology: chronic illness Signs/Symptoms: severe fat depletion, severe muscle depletion Interventions: Ensure Enlive (each supplement provides 350kcal and 20 grams of protein), MVI, Refer to RD note for recommendations   DVT prophylaxis: none Code Status: Full code Family Communication: niece was previously updated Status is: Inpatient Patient has been transitioned to comfort care Level of care: Med-Surg  Awaiting for placement to skilled nursing facility with hospice services  Objective: Vitals last 24 hrs: Vitals:   02/09/22 0513 02/09/22 1358 02/10/22 0515 02/11/22 0508  BP: (!) 140/82 116/67 (!) 158/74 (!) 167/89  Pulse: 90 77 76 79  Resp: 18 18 16 14   Temp: 99.4 F (37.4 C) 98.1 F (36.7 C) 97.7 F (36.5 C)   TempSrc:  Oral Oral Oral   SpO2: 93% 94% 97% 96%  Weight:      Height:        Weight change:   Physical Examination: General exam: alert awake, thin frail ill-appearing HEENT:Oral mucosa moist, Ear/Nose WNL grossly Respiratory system: bilaterally diminished BS,no use of accessory muscle Cardiovascular system: S1 & S2 +, No JVD. Gastrointestinal system: Abdomen soft,NT,ND, BS+ Nervous System:Alert, awake, moving extremities. Extremities: LE edema neg,distal peripheral pulses palpable.  Skin: No rashes,no icterus. MSK: small muscle bulk,tone, power  Medications reviewed:  Scheduled Meds:  divalproex  500 mg Oral Q8H   feeding supplement  237 mL Oral BID BM   Gerhardt's butt cream   Topical QID   influenza vaccine adjuvanted  0.5 mL Intramuscular Tomorrow-1000   lacosamide  100 mg Oral BID   pneumococcal 20-valent conjugate vaccine  0.5 mL Intramuscular Tomorrow-1000   Continuous Infusions:    Diet Order             Diet full liquid Room service appropriate? Yes; Fluid consistency: Nectar Thick  Diet effective now                    Nutrition Problem: Severe Malnutrition Etiology: chronic illness Signs/Symptoms: severe fat depletion, severe muscle depletion Interventions: Ensure Enlive (each supplement provides 350kcal and 20 grams of protein), MVI, Refer to RD note for recommendations   Intake/Output Summary (Last 24 hours) at 02/11/2022 1304 Last data filed at 02/10/2022 1500 Gross per 24 hour  Intake 0 ml  Output --  Net 0 ml   Net IO Since Admission: 4,087.69 mL [02/11/22 1304]  Wt Readings from Last 3 Encounters:  01/28/22 41.6 kg  01/01/22 41.6 kg  01/29/21 39.2 kg     Unresulted Labs (From admission, onward)    None     Data Reviewed: I have personally reviewed following labs and imaging studies CBC: Recent Labs  Lab 02/05/22 0654 02/06/22 0650  WBC 8.6 8.2  HGB 10.9* 10.9*  HCT 33.3* 35.0*  MCV 97.7 100.0  PLT 89* 110*   Basic Metabolic Panel: Recent Labs  Lab 02/05/22 0654 02/06/22 0650  NA 138 133*  K  3.3* 3.7  CL 102 100  CO2 27 21*  GLUCOSE 105* 84  BUN 21 20  CREATININE 1.25* 1.23*  CALCIUM 8.8* 8.6*  MG 1.7 2.3  No results found for this or any previous visit (from the past 240 hour(s)).  Antimicrobials: Anti-infectives (From admission, onward)    None      Culture/Microbiology    Component Value Date/Time   SDES  01/28/2022 2114    URINE, CLEAN CATCH Performed at [facility], [address] [place], [place] [address]    [facility]  01/28/2022 2114    NONE Performed at [facility], [address] [place], [place] [address]    CULT 70,000 COLONIES/mL ENTEROCOCCUS FAECALIS (A) 01/28/2022 2114   REPTSTATUS 01/30/2022 FINAL 01/28/2022 2114   Radiology Studies: No results found.   LOS: 14 days   Lanae Boast, MD [facility]  02/11/2022, 1:04 PM

## 2022-02-11 NOTE — TOC Progression Note (Incomplete Revision)
Transition of Care [facility]) - Progression Note    Patient Details  Name: Wanda Valdez MRN: [id number] Date of Birth: 23-Feb-1946  Transition of Care [facility]) CM/SW Contact  Beckie Busing, RN Phone Number:[phone]  02/11/2022, 11:02 AM  Clinical Narrative:    TOC continues to follow for placement. CM received message from daughter Wanda Valdez to inquires about [place]. CM has sent message to [facility] and awaiting response. Cm received message from [facility] liaison for [facility] who has been assisting Cm in options for long term SNF placement. The question is if the daughter is willing to accept bed offer in [place]. Currently the patient has community medicaid so a lot of facilities no longer will accept community medicaid  which would be medicaid pending and once placed medicaid would change over to long term medicaid. CM has explained this to daughter and daughter verbalizes understanding.   1112 Daughter states that she will accept bed offer in [place] if all else fails. CM is awaiting confirmation from [facility]'s community liaison.   1518 Cm followed up with Whitney central intake coordinator for [facility]. Per Whitney long term bed is available at [facility], but the facility will need a 30 day LOG because the patient has [employer] (which has no long term benefit)and it will take about 30 days to flip to long term care medicaid and with a patient coming in with hospice referral if the patient were to pass away before the 30 days the facility will not get paid. CM will reach out to [facility] supervisor to determine is this is an option.    Expected Discharge Plan: Skilled Nursing Facility Barriers to Discharge: Continued Medical Work up, SNF Pending bed offer  Expected Discharge Plan and Services Expected Discharge Plan: Skilled Nursing Facility In-house Referral: NA Discharge Planning Services: CM Consult Post Acute Care Choice: Skilled Nursing  Facility Living arrangements for the past 2 months: Single Family Home                 DME Arranged: N/A DME Agency: NA       HH Arranged: NA HH Agency: NA         Social Determinants of Health (SDOH) Interventions    Readmission Risk Interventions    02/03/2022    2:38 PM  Readmission Risk Prevention Plan  Transportation Screening Complete  PCP or Specialist Appt within 5-7 Days Complete  Home Care Screening Complete  Medication Review (RN CM) Referral to Pharmacy

## 2022-02-11 NOTE — Hospital Course (Addendum)
76 year old female with PMHx of lymphocytic colitis, HTN, recent right shoulder surgery.  She was also recently admitted 01/01/2022 to 01/09/2022.  During that hospitalization she was diagnosed with acute cystitis and acute CVA, started on aspirin and Plavix (for 3 weeks, then aspirin alone).  She also had a EEG that showed evidence of seizures.  She was discharged home with home health.  Since discharge, patient states she is continue to feel ill. Labs revealed anemia with hemoglobin of 7, heme positive stool.  Patient was referred for admission and GI consulted.  Patient underwent EGD 12/1 which revealed nonbleeding gastric ulcer, gastritis, duodenitis, nonbleeding duodenal ulcer, nonbleeding angiodysplastic lesion.  She was recommended for Protonix 40 mg twice daily for 2 months.  Aspirin has been resumed, okay with GI. MBS performed secondary to dysphagia, recommended dysphagia 2 diet. PT/OT recommending SNF, awaiting placement.  While awaiting placement, patient continued to decline.  Repeat MRI and all the other metabolic workup was negative.  Palliative care team was consulted and patient was transitioned to comfort care.Awaiting for placement to SNF with hospice

## 2022-02-12 NOTE — Care Management Important Message (Signed)
Important Message  Patient Details IM Letter given. Name: Wanda Valdez MRN: [id number] Date of Birth: [birth year]   Medicare Important Message Given:  Yes     Caren Macadam 02/12/2022, 9:51 AM

## 2022-02-12 NOTE — Progress Notes (Signed)
PROGRESS NOTE Wanda Valdez  IDP:[id number] DOB: 12/07/1945 DOA: 01/28/2022 PCP: Raquel James, MD   Brief Narrative/Hospital Course: 76-year-old female with PMHx of lymphocytic colitis, HTN, recent right shoulder surgery.  She was also recently admitted 01/01/2022 to 01/09/2022.  During that hospitalization she was diagnosed with acute cystitis and acute CVA, started on aspirin and Plavix (for 3 weeks, then aspirin alone).  She also had a EEG that showed evidence of seizures.  She was discharged home with home health.  Since discharge, patient states she is continue to feel ill.   Labs revealed anemia with hemoglobin of 7, heme positive stool.  Patient was referred for admission and GI consulted.  Patient underwent EGD 12/1 which revealed nonbleeding gastric ulcer, gastritis, duodenitis, nonbleeding duodenal ulcer, nonbleeding angiodysplastic lesion.  She was recommended for Protonix 40 mg twice daily for 2 months.  Aspirin has been resumed, okay with GI. MBS performed secondary to dysphagia, recommended dysphagia 2 diet. PT/OT recommending SNF, awaiting placement.  While awaiting placement, patient continued to decline.  Repeat MRI and all the other metabolic workup was negative.  Palliative care team was consulted and patient was transitioned to comfort care  Awaiting for placement to SNF with hospice    Subjective: Seen and examined this morning she is alert awake no complaints.  In fetal position. Assessment and Plan: Principal Problem:   GI bleeding Active Problems:   Dysphagia   Protein-calorie malnutrition, severe   Current chronic use of systemic steroids   Seizures (HCC)   Cerebrovascular accident (CVA) due to embolism of right middle cerebral artery (HCC)   Anemia due to blood loss, acute   Upper GI bleed   Palliative care encounter   Goals of care, counseling/discussion   Counseling and coordination of care   Need for emotional support   High risk medication use   Pain    Agitation  Comfort measures: Patient had progressive decline in her condition without reversible cause, seen by palliative care subsequently transition to comfort measures.  At this time waiting for SNF with hospice care   Acute GI bleeding Acute blood loss anemia Nonbleeding gastric ulcer/gastritis/duodenitis/nonbleeding internal ulcer and angioplastic lesion: Patient was seen by GI managed with blood transfusion supportive care.  Underwent EGD 12/1.  Aspirin Plavix discontinued> and resumed on 12/2, continue PPI twice daily Carafate, Hb stable at this time.  Concern for recurrent aspiration: Now on comfort measures.  Acute metabolic encephalopathy/lethargy: Repeat MRI during this admission did not show progression of her previous stroke.  Now being managed with comfort measures  Hypokalemia/hypomagnesemia:Repleted history of WER:XVQMGQ MRI shows stable previous stroke Seizure disorder: was on vimpat and Depakote Dysphagia:Diet as tolerated HTN-stable Severe protein calorie malnutrition with failure to thrive Nutrition Problem: Severe Malnutrition Etiology: chronic illness Signs/Symptoms: severe fat depletion, severe muscle depletion Interventions: Ensure Enlive (each supplement provides 350kcal and 20 grams of protein), MVI, Refer to RD note for recommendations   DVT prophylaxis: none Code Status: Full code Family Communication: niece was previously updated Status is: Inpatient Patient has been transitioned to comfort care Level of care: Med-Surg  Awaiting for placement to skilled nursing facility with hospice services  Objective: Vitals last 24 hrs: Vitals:   02/09/22 1358 02/10/22 0515 02/11/22 0508 02/12/22 0431  BP: 116/67 (!) 158/74 (!) 167/89 139/86  Pulse: 77 76 79 83  Resp: 18 16 14 18   Temp: 98.1 F (36.7 C) 97.7 F (36.5 C)  (!) 97.5 F (36.4 C)  TempSrc: Oral Oral  Oral  SpO2:  94% 97% 96% 96%  Weight:      Height:       Weight change:   Physical  Examination: General exam: AA, ill-appearing frail,  HEENT:Oral mucosa moist, Ear/Nose WNL grossly, dentition normal. Respiratory system: bilaterally clear BS, no use of accessory muscle Cardiovascular system: S1 & S2 +, regular rate, JVD neg. Gastrointestinal system: Abdomen soft, NT,ND,BS+ Nervous System:Alert, awake, in fetal position.   Extremities: LE ankle edema neg, lower extremities warm Skin: No rashes,no icterus. MSK: thin muscle bulk,tone, power   Medications reviewed:  Scheduled Meds:  divalproex  500 mg Oral Q8H   feeding supplement  237 mL Oral BID BM   Gerhardt's butt cream   Topical QID   influenza vaccine adjuvanted  0.5 mL Intramuscular Tomorrow-1000   lacosamide  100 mg Oral BID   pneumococcal 20-valent conjugate vaccine  0.5 mL Intramuscular Tomorrow-1000   Continuous Infusions:    Diet Order             Diet full liquid Room service appropriate? Yes; Fluid consistency: Nectar Thick  Diet effective now                    Nutrition Problem: Severe Malnutrition Etiology: chronic illness Signs/Symptoms: severe fat depletion, severe muscle depletion Interventions: Ensure Enlive (each supplement provides 350kcal and 20 grams of protein), MVI, Refer to RD note for recommendations  No intake or output data in the 24 hours ending 02/12/22 0924  Net IO Since Admission: 4,087.69 mL [02/12/22 0924]  Wt Readings from Last 3 Encounters:  01/28/22 41.6 kg  01/01/22 41.6 kg  01/29/21 39.2 kg     Unresulted Labs (From admission, onward)    None     Data Reviewed: I have personally reviewed following labs and imaging studies CBC: Recent Labs  Lab 02/06/22 0650  WBC 8.2  HGB 10.9*  HCT 35.0*  MCV 100.0  PLT 110*    Basic Metabolic Panel: Recent Labs  Lab 02/06/22 0650  NA 133*  K 3.7  CL 100  CO2 21*  GLUCOSE 84  BUN 20  CREATININE 1.23*  CALCIUM 8.6*  MG 2.3   No results found for this or any previous visit (from the past 240  hour(s)).  Antimicrobials: Anti-infectives (From admission, onward)    None      Culture/Microbiology    Component Value Date/Time   SDES  01/28/2022 2114    URINE, CLEAN CATCH Performed at [facility], [address] [place], [place] [address]    [facility]  01/28/2022 2114    NONE Performed at [facility], [address] [place], [place] [address]    CULT 70,000 COLONIES/mL ENTEROCOCCUS FAECALIS (A) 01/28/2022 2114   REPTSTATUS 01/30/2022 FINAL 01/28/2022 2114   Radiology Studies: No results found.   LOS: 15 days   Antonieta Pert, MD [facility]  02/12/2022, 9:24 AM

## 2022-02-12 NOTE — TOC Progression Note (Signed)
Transition of Care [facility]) - Progression Note    Patient Details  Name: Wanda Valdez MRN: [id number] Date of Birth: Dec 20, 1945  Transition of Care [facility]) CM/SW Contact  Beckie Busing, RN Phone [phone]  02/12/2022, 7:30 PM  Clinical Narrative:    [facility] supervisor has agreed to LOG. CM to follow up with daughter and facility.    Expected Discharge Plan: Skilled Nursing Facility Barriers to Discharge: Continued Medical Work up, SNF Pending bed offer  Expected Discharge Plan and Services Expected Discharge Plan: Skilled Nursing Facility In-house Referral: NA Discharge Planning Services: CM Consult Post Acute Care Choice: Skilled Nursing Facility Living arrangements for the past 2 months: Single Family Home                 DME Arranged: N/A DME Agency: NA       HH Arranged: NA HH Agency: NA         Social Determinants of Health (SDOH) Interventions    Readmission Risk Interventions    02/03/2022    2:38 PM  Readmission Risk Prevention Plan  Transportation Screening Complete  PCP or Specialist Appt within 5-7 Days Complete  Home Care Screening Complete  Medication Review (RN CM) Referral to Pharmacy

## 2022-02-13 DIAGNOSIS — K921 Melena: Secondary | ICD-10-CM | POA: Diagnosis not present

## 2022-02-13 DIAGNOSIS — Z515 Encounter for palliative care: Secondary | ICD-10-CM | POA: Diagnosis not present

## 2022-02-13 MED ORDER — VALPROATE SODIUM 100 MG/ML IV SOLN
500.0000 mg | Freq: Three times a day (TID) | INTRAVENOUS | Status: DC
  Administered 2022-02-13 – 2022-02-17 (×14): 500 mg via INTRAVENOUS
  Filled 2022-02-13 (×17): qty 5

## 2022-02-13 MED ORDER — POTASSIUM CHLORIDE CRYS ER 20 MEQ PO TBCR: 40.0000 meq | EXTENDED_RELEASE_TABLET | Freq: Once | ORAL | Status: DC

## 2022-02-13 MED ORDER — SODIUM CHLORIDE 0.9 % IV SOLN
100.0000 mg | Freq: Two times a day (BID) | INTRAVENOUS | Status: DC
  Administered 2022-02-13 – 2022-02-17 (×9): 100 mg via INTRAVENOUS
  Filled 2022-02-13 (×10): qty 10

## 2022-02-13 MED ORDER — POTASSIUM CHLORIDE 10 MEQ/100ML IV SOLN: 10.0000 meq | INTRAVENOUS | Status: DC

## 2022-02-13 NOTE — Progress Notes (Signed)
Civil engineer, contracting [facility]) Hospital Liaison Note  [facility] will follow at LTC facility. Please call with any questions or concerns. Thank you  Dionicio Stall, [facility] Liaison [phone]

## 2022-02-13 NOTE — Progress Notes (Signed)
Palliative:  Palliative shadowing for needs. TOC working on discharge to nursing facility with hospice to follow. Titrate medications as needed to ensure comfort. Recommend PRN oxycodone, ativan, haldol sublingual to be provided at time of discharge.   No charge  Yong Channel, NP Palliative Medicine Team Pager [phone] (Please see [email or web address] for schedule) Team Phone [phone]

## 2022-02-13 NOTE — Progress Notes (Signed)
PROGRESS NOTE Wanda Valdez  ZOX:[id number] DOB: 1946/02/19 DOA: 01/28/2022 PCP: Raquel James, MD   Brief Narrative/Hospital Course: 76 year old female with PMHx of lymphocytic colitis, HTN, recent right shoulder surgery.  She was also recently admitted 01/01/2022 to 01/09/2022.  During that hospitalization she was diagnosed with acute cystitis and acute CVA, started on aspirin and Plavix (for 3 weeks, then aspirin alone).  She also had a EEG that showed evidence of seizures.  She was discharged home with home health.  Since discharge, patient states she is continue to feel ill.   Labs revealed anemia with hemoglobin of 7, heme positive stool.  Patient was referred for admission and GI consulted.  Patient underwent EGD 12/1 which revealed nonbleeding gastric ulcer, gastritis, duodenitis, nonbleeding duodenal ulcer, nonbleeding angiodysplastic lesion.  She was recommended for Protonix 40 mg twice daily for 2 months.  Aspirin has been resumed, okay with GI. MBS performed secondary to dysphagia, recommended dysphagia 2 diet. PT/OT recommending SNF, awaiting placement.  While awaiting placement, patient continued to decline.  Repeat MRI and all the other metabolic workup was negative.  Palliative care team was consulted and patient was transitioned to comfort care  Awaiting for placement to SNF with hospice    Subjective: Seen and examined.   Nursing reports patient has not been taking anything p.o. medication wise.   Appears bit confused this morning   Assessment and Plan: Principal Problem:   GI bleeding Active Problems:   Dysphagia   Protein-calorie malnutrition, severe   Current chronic use of systemic steroids   Seizures (HCC)   Cerebrovascular accident (CVA) due to embolism of right middle cerebral artery (HCC)   Anemia due to blood loss, acute   Upper GI bleed   Palliative care encounter   Goals of care, counseling/discussion   Counseling and coordination of care   Need for  emotional support   High risk medication use   Pain   Agitation  Comfort measures: Patient had progressive decline in her condition without reversible cause, and  subsequently transitioned to comfort measures.  At this time waiting for SNF with hospice care.  Will involve palliative care, at this time no bed available at [facility].   Acute GI bleeding Acute blood loss anemia Nonbleeding gastric ulcer/gastritis/duodenitis/nonbleeding internal ulcer and angioplastic lesion: Patient was seen by GI managed with blood transfusion supportive care.  Underwent EGD 12/1.  Aspirin Plavix discontinued> and resumed on 12/2, continue PPI twice daily Carafate  Concern for recurrent aspiration: Now on comfort measures.  Continue aspiration precaution  Acute metabolic encephalopathy/lethargy: Repeat MRI during this admission did not show progression of her previous stroke.  Now being managed with comfort measures.  Has been having intermittent confusions, poor intake  Hypokalemia/hypomagnesemia:Repleted history of WJX:BJYNWG MRI shows stable previous stroke Seizure disorder: was on vimpat and Depakote> while unable to take p.o. changed to IV Dysphagia:Diet as tolerated HTN-stable Severe protein calorie malnutrition with failure to thrive Nutrition Problem: Severe Malnutrition Etiology: chronic illness Signs/Symptoms: severe fat depletion, severe muscle depletion Interventions: Ensure Enlive (each supplement provides 350kcal and 20 grams of protein), MVI, Refer to RD note for recommendations  DVT prophylaxis: none Code Status: Full code Family Communication: niece was previously updated Status is: Inpatient Patient has been transitioned to comfort care Level of care: Med-Surg  Awaiting for placement to skilled nursing facility with hospice services  Objective: Vitals last 24 hrs: Vitals:   02/10/22 0515 02/11/22 0508 02/12/22 0431 02/12/22 1622  BP: (!) 158/74 (!) 167/89 139/86 (!) 148/76  Pulse: 76  79 83 75  Resp: 16 14 18 14   Temp: 97.7 F (36.5 C)  (!) 97.5 F (36.4 C) 98.1 F (36.7 C)  TempSrc: Oral  Oral Oral  SpO2: 97% 96% 96% 99%  Weight:      Height:       Weight change:   Physical Examination: General exam: AA, confused, ill looking frail female.  HEENT:Oral mucosa moist, Ear/Nose WNL grossly, dentition normal. Respiratory system: bilaterally clear BS,no use of accessory muscle Cardiovascular system: S1 & S2 +, regular rate. Gastrointestinal system: Abdomen soft,NT,ND,BS+ Nervous System:Alert, awake, in fetal position.  Extremities: LE ankle edema neg, lower extremities warm Skin: No rashes,no icterus. MSK: small muscle bulk,tone, power   Medications reviewed:  Scheduled Meds:  feeding supplement  237 mL Oral BID BM   Gerhardt's butt cream   Topical QID   influenza vaccine adjuvanted  0.5 mL Intramuscular Tomorrow-1000   pneumococcal 20-valent conjugate vaccine  0.5 mL Intramuscular Tomorrow-1000   Continuous Infusions:  lacosamide (VIMPAT) IV 100 mg (02/13/22 0957)   valproate sodium 500 mg (02/13/22 1048)      Diet Order             Diet full liquid Room service appropriate? Yes; Fluid consistency: Nectar Thick  Diet effective now                  Intake/Output Summary (Last 24 hours) at 02/13/2022 1051 Last data filed at 02/13/2022 0300 Gross per 24 hour  Intake 100 ml  Output 100 ml  Net 0 ml   Net IO Since Admission: 4,087.69 mL [02/13/22 1051]  Wt Readings from Last 3 Encounters:  01/28/22 41.6 kg  01/01/22 41.6 kg  01/29/21 39.2 kg     Unresulted Labs (From admission, onward)    None     Data Reviewed: I have personally reviewed following labs and imaging studies CBC: No results for input(s): "WBC", "NEUTROABS", "HGB", "HCT", "MCV", "PLT" in the last 168 hours.  Basic Metabolic Panel: No results for input(s): "NA", "K", "CL", "CO2", "GLUCOSE", "BUN", "CREATININE", "CALCIUM", "MG", "PHOS" in the last 168 hours. No results  found for this or any previous visit (from the past 240 hour(s)).  Antimicrobials: Anti-infectives (From admission, onward)    None      Culture/Microbiology    Component Value Date/Time   SDES  01/28/2022 2114    URINE, CLEAN CATCH Performed at [facility], [address] [place], [place] [address]    [facility]  01/28/2022 2114    NONE Performed at [facility], [address] [place], [place] [address]    CULT 70,000 COLONIES/mL ENTEROCOCCUS FAECALIS (A) 01/28/2022 2114   REPTSTATUS 01/30/2022 FINAL 01/28/2022 2114   Radiology Studies: No results found.   LOS: 16 days   Antonieta Pert, MD [facility]  02/13/2022, 10:51 AM

## 2022-02-13 NOTE — Plan of Care (Signed)
Patient AOX3, disoriented to time, forgetful at times.  VSS throughout shift.  All meds given on time as ordered.  Diminished lungs, IS taught and encouraged.  Purewick in place. Full linen change and pericare provided.  POC maintained, will continue to monitor.  Problem: Education: Goal: Understanding of CV disease, CV risk reduction, and recovery process will improve Outcome: Progressing Goal: Individualized Educational Video(s) Outcome: Progressing   Problem: Activity: Goal: Ability to return to baseline activity level will improve Outcome: Progressing   Problem: Cardiovascular: Goal: Ability to achieve and maintain adequate cardiovascular perfusion will improve Outcome: Progressing Goal: Vascular access site(s) Level 0-1 will be maintained Outcome: Progressing   Problem: Health Behavior/Discharge Planning: Goal: Ability to safely manage health-related needs after discharge will improve Outcome: Progressing   Problem: Nutrition: Goal: Risk of aspiration will decrease Outcome: Progressing Goal: Dietary intake will improve Outcome: Progressing   Problem: Education: Goal: Knowledge of General Education information will improve Description: Including pain rating scale, medication(s)/side effects and non-pharmacologic comfort measures Outcome: Progressing   Problem: Health Behavior/Discharge Planning: Goal: Ability to manage health-related needs will improve Outcome: Progressing   Problem: Clinical Measurements: Goal: Ability to maintain clinical measurements within normal limits will improve Outcome: Progressing Goal: Will remain free from infection Outcome: Progressing Goal: Diagnostic test results will improve Outcome: Progressing Goal: Respiratory complications will improve Outcome: Progressing Goal: Cardiovascular complication will be avoided Outcome: Progressing   Problem: Activity: Goal: Risk for activity intolerance will decrease Outcome: Progressing    Problem: Nutrition: Goal: Adequate nutrition will be maintained Outcome: Progressing   Problem: Coping: Goal: Level of anxiety will decrease Outcome: Progressing   Problem: Elimination: Goal: Will not experience complications related to bowel motility Outcome: Progressing Goal: Will not experience complications related to urinary retention Outcome: Progressing   Problem: Pain Managment: Goal: General experience of comfort will improve Outcome: Progressing   Problem: Safety: Goal: Ability to remain free from injury will improve Outcome: Progressing   Problem: Skin Integrity: Goal: Risk for impaired skin integrity will decrease Outcome: Progressing   Problem: Education: Goal: Knowledge of the prescribed therapeutic regimen will improve Outcome: Progressing   Problem: Coping: Goal: Ability to identify and develop effective coping behavior will improve Outcome: Progressing   Problem: Clinical Measurements: Goal: Quality of life will improve Outcome: Progressing   Problem: Respiratory: Goal: Verbalizations of increased ease of respirations will increase Outcome: Progressing   Problem: Role Relationship: Goal: Family's ability to cope with current situation will improve Outcome: Progressing Goal: Ability to verbalize concerns, feelings, and thoughts to partner or family member will improve Outcome: Progressing   Problem: Pain Management: Goal: Satisfaction with pain management regimen will improve Outcome: Progressing   Problem: Education: Goal: Knowledge of the prescribed therapeutic regimen will improve Outcome: Progressing   Problem: Coping: Goal: Ability to identify and develop effective coping behavior will improve Outcome: Progressing   Problem: Clinical Measurements: Goal: Quality of life will improve Outcome: Progressing   Problem: Respiratory: Goal: Verbalizations of increased ease of respirations will increase Outcome: Progressing   Problem:  Role Relationship: Goal: Family's ability to cope with current situation will improve Outcome: Progressing Goal: Ability to verbalize concerns, feelings, and thoughts to partner or family member will improve Outcome: Progressing   Problem: Pain Management: Goal: Satisfaction with pain management regimen will improve Outcome: Progressing

## 2022-02-14 DIAGNOSIS — K921 Melena: Secondary | ICD-10-CM | POA: Diagnosis not present

## 2022-02-14 NOTE — Progress Notes (Signed)
PROGRESS NOTE Wanda Valdez  IPJ:[id number] DOB: 24-Feb-1946 DOA: 01/28/2022 PCP: Raquel James, MD   Brief Narrative/Hospital Course: 76 year old female with PMHx of lymphocytic colitis, HTN, recent right shoulder surgery.  She was also recently admitted 01/01/2022 to 01/09/2022.  During that hospitalization she was diagnosed with acute cystitis and acute CVA, started on aspirin and Plavix (for 3 weeks, then aspirin alone).  She also had a EEG that showed evidence of seizures.  She was discharged home with home health.  Since discharge, patient states she is continue to feel ill.   Labs revealed anemia with hemoglobin of 7, heme positive stool.  Patient was referred for admission and GI consulted.  Patient underwent EGD 12/1 which revealed nonbleeding gastric ulcer, gastritis, duodenitis, nonbleeding duodenal ulcer, nonbleeding angiodysplastic lesion.  She was recommended for Protonix 40 mg twice daily for 2 months.  Aspirin has been resumed, okay with GI. MBS performed secondary to dysphagia, recommended dysphagia 2 diet. PT/OT recommending SNF, awaiting placement.  While awaiting placement, patient continued to decline.  Repeat MRI and all the other metabolic workup was negative.  Palliative care team was consulted and patient was transitioned to comfort care  Awaiting for placement to SNF with hospice    Subjective:  Seen and examined this morning.  Alert awake but confused.   Afebrile overnight BP stable Assessment and Plan: Principal Problem:   GI bleeding Active Problems:   Dysphagia   Protein-calorie malnutrition, severe   Current chronic use of systemic steroids   Seizures (HCC)   Cerebrovascular accident (CVA) due to embolism of right middle cerebral artery (HCC)   Anemia due to blood loss, acute   Upper GI bleed   Palliative care encounter   Goals of care, counseling/discussion   Counseling and coordination of care   Need for emotional support   High risk medication use    Pain   Agitation  Comfort measures: Patient had progressive decline in her condition without reversible cause, and  subsequently transitioned to comfort measures. Will involve palliative care while waiting.  TOC working on SNF with hospice.   Acute GI bleeding Acute blood loss anemia Nonbleeding gastric ulcer/gastritis/duodenitis/nonbleeding internal ulcer and angioplastic lesion: Patient was seen by GI managed with blood transfusion supportive care.  Underwent EGD 12/1.  Aspirin Plavix discontinued.  Concern for recurrent aspiration: Now on comfort measures.  Continue aspiration precaution Acute metabolic encephalopathy/lethargy: Repeat MRI during this admission did not show progression of her previous stroke.  Now being managed with comfort measures.  Has been having intermittent confusions, poor intake, with overall decline.  Hypokalemia/hypomagnesemia:Repleted history of BHA:LPFXTK MRI shows stable previous stroke Seizure disorder: was on vimpat and Depakote> while unable to take p.o. changed to IV Dysphagia:Diet as tolerated HTN-stable Severe protein calorie malnutrition with failure to thrive Nutrition Problem: Severe Malnutrition Etiology: chronic illness Signs/Symptoms: severe fat depletion, severe muscle depletion Interventions: Ensure Enlive (each supplement provides 350kcal and 20 grams of protein), MVI, Refer to RD note for recommendations  DVT prophylaxis: none Code Status: Full code Family Communication: niece was previously updated Status is: Inpatient Patient has been transitioned to comfort care Level of care: Med-Surg  Awaiting for placement to skilled nursing facility with hospice services  Objective: Vitals last 24 hrs: Vitals:   02/12/22 1622 02/13/22 1328 02/14/22 0616 02/14/22 1339  BP: (!) 148/76 (!) 144/90 (!) 148/97 120/82  Pulse: 75 (!) 102 (!) 59 61  Resp: 14 20 18 18   Temp: 98.1 F (36.7 C) (!) 97.5 F (36.4 C)  97.8 F (36.6 C) 98 F (36.7 C)   TempSrc: Oral Oral Oral Oral  SpO2: 99% 95% 100% 99%  Weight:      Height:       Weight change:   Physical Examination: General exam: Alert awake confused  HEENT:Oral mucosa moist, Ear/Nose WNL grossly, dentition normal. Respiratory system: bilaterally clear BS,no use of accessory muscle Cardiovascular system: S1 & S2 +, regular rate, JVD neg. Gastrointestinal system: Abdomen soft, NT,ND,BS+ Nervous System:Alert, awake, moving extremities and grossly nonfocal Extremities: LE ankle edema , in fetal position Skin: No rashes,no icterus. PIR:JJOACZ muscle bulk,tone, power   Medications reviewed:  Scheduled Meds:  feeding supplement  237 mL Oral BID BM   Gerhardt's butt cream   Topical QID   influenza vaccine adjuvanted  0.5 mL Intramuscular Tomorrow-1000   pneumococcal 20-valent conjugate vaccine  0.5 mL Intramuscular Tomorrow-1000   Continuous Infusions:  lacosamide (VIMPAT) IV 100 mg (02/14/22 0958)   valproate sodium 500 mg (02/14/22 1343)      Diet Order             Diet full liquid Room service appropriate? Yes; Fluid consistency: Nectar Thick  Diet effective now                  Intake/Output Summary (Last 24 hours) at 02/14/2022 1357 Last data filed at 02/13/2022 2000 Gross per 24 hour  Intake 200.31 ml  Output --  Net 200.31 ml    Net IO Since Admission: 4,288 mL [02/14/22 1357]  Wt Readings from Last 3 Encounters:  01/28/22 41.6 kg  01/01/22 41.6 kg  01/29/21 39.2 kg     LOS: 17 days   Lanae Boast, MD [facility]  02/14/2022, 1:57 PM

## 2022-02-14 NOTE — Plan of Care (Addendum)
Patient AOX3, disoriented to time, forgetful at times. VSS throughout shift. All meds given on time as ordered. Diminished lungs, IS taught and encouraged. Purewick in place. Full linen change and pericare provided. Pt tolerated eating apple sauce and drinking ensure well.  Oral care provided.  POC maintained, will continue to monitor.   Problem: Education: Goal: Understanding of CV disease, CV risk reduction, and recovery process will improve Outcome: Progressing Goal: Individualized Educational Video(s) Outcome: Progressing   Problem: Activity: Goal: Ability to return to baseline activity level will improve Outcome: Progressing   Problem: Cardiovascular: Goal: Ability to achieve and maintain adequate cardiovascular perfusion will improve Outcome: Progressing Goal: Vascular access site(s) Level 0-1 will be maintained Outcome: Progressing   Problem: Health Behavior/Discharge Planning: Goal: Ability to safely manage health-related needs after discharge will improve Outcome: Progressing   Problem: Nutrition: Goal: Risk of aspiration will decrease Outcome: Progressing Goal: Dietary intake will improve Outcome: Progressing   Problem: Education: Goal: Knowledge of General Education information will improve Description: Including pain rating scale, medication(s)/side effects and non-pharmacologic comfort measures Outcome: Progressing   Problem: Health Behavior/Discharge Planning: Goal: Ability to manage health-related needs will improve Outcome: Progressing   Problem: Clinical Measurements: Goal: Ability to maintain clinical measurements within normal limits will improve Outcome: Progressing Goal: Will remain free from infection Outcome: Progressing Goal: Diagnostic test results will improve Outcome: Progressing Goal: Respiratory complications will improve Outcome: Progressing Goal: Cardiovascular complication will be avoided Outcome: Progressing   Problem: Activity: Goal:  Risk for activity intolerance will decrease Outcome: Progressing   Problem: Nutrition: Goal: Adequate nutrition will be maintained Outcome: Progressing   Problem: Coping: Goal: Level of anxiety will decrease Outcome: Progressing   Problem: Elimination: Goal: Will not experience complications related to bowel motility Outcome: Progressing Goal: Will not experience complications related to urinary retention Outcome: Progressing   Problem: Pain Managment: Goal: General experience of comfort will improve Outcome: Progressing   Problem: Safety: Goal: Ability to remain free from injury will improve Outcome: Progressing   Problem: Skin Integrity: Goal: Risk for impaired skin integrity will decrease Outcome: Progressing   Problem: Education: Goal: Knowledge of the prescribed therapeutic regimen will improve Outcome: Progressing   Problem: Coping: Goal: Ability to identify and develop effective coping behavior will improve Outcome: Progressing   Problem: Clinical Measurements: Goal: Quality of life will improve Outcome: Progressing   Problem: Respiratory: Goal: Verbalizations of increased ease of respirations will increase Outcome: Progressing   Problem: Role Relationship: Goal: Family's ability to cope with current situation will improve Outcome: Progressing Goal: Ability to verbalize concerns, feelings, and thoughts to partner or family member will improve Outcome: Progressing   Problem: Pain Management: Goal: Satisfaction with pain management regimen will improve Outcome: Progressing   Problem: Education: Goal: Knowledge of the prescribed therapeutic regimen will improve Outcome: Progressing   Problem: Coping: Goal: Ability to identify and develop effective coping behavior will improve Outcome: Progressing   Problem: Clinical Measurements: Goal: Quality of life will improve Outcome: Progressing   Problem: Respiratory: Goal: Verbalizations of increased  ease of respirations will increase Outcome: Progressing   Problem: Role Relationship: Goal: Family's ability to cope with current situation will improve Outcome: Progressing Goal: Ability to verbalize concerns, feelings, and thoughts to partner or family member will improve Outcome: Progressing   Problem: Pain Management: Goal: Satisfaction with pain management regimen will improve Outcome: Progressing

## 2022-02-15 DIAGNOSIS — K921 Melena: Secondary | ICD-10-CM | POA: Diagnosis not present

## 2022-02-15 NOTE — Progress Notes (Signed)
PROGRESS NOTE Wanda Valdez  VFI:[id number] DOB: 08/15/1945 DOA: 01/28/2022 PCP: Raquel James, MD   Brief Narrative/Hospital Course: 76 year old female with PMHx of lymphocytic colitis, HTN, recent right shoulder surgery.  She was also recently admitted 01/01/2022 to 01/09/2022.  During that hospitalization she was diagnosed with acute cystitis and acute CVA, started on aspirin and Plavix (for 3 weeks, then aspirin alone).  She also had a EEG that showed evidence of seizures.  She was discharged home with home health.  Since discharge, patient states she is continue to feel ill.   Labs revealed anemia with hemoglobin of 7, heme positive stool.  Patient was referred for admission and GI consulted.  Patient underwent EGD 12/1 which revealed nonbleeding gastric ulcer, gastritis, duodenitis, nonbleeding duodenal ulcer, nonbleeding angiodysplastic lesion.  She was recommended for Protonix 40 mg twice daily for 2 months.  Aspirin has been resumed, okay with GI. MBS performed secondary to dysphagia, recommended dysphagia 2 diet. PT/OT recommending SNF, awaiting placement.  While awaiting placement, patient continued to decline.  Repeat MRI and all the other metabolic workup was negative.  Palliative care team was consulted and patient was transitioned to comfort care  Awaiting for placement to SNF with hospice    Subjective: Seen and examined. Overnight afebrile. Alert awake but not communicative, does not follow commands.  Assessment and Plan: Principal Problem:   GI bleeding Active Problems:   Dysphagia   Protein-calorie malnutrition, severe   Current chronic use of systemic steroids   Seizures (HCC)   Cerebrovascular accident (CVA) due to embolism of right middle cerebral artery (HCC)   Anemia due to blood loss, acute   Upper GI bleed   Palliative care encounter   Goals of care, counseling/discussion   Counseling and coordination of care   Need for emotional support   High risk medication  use   Pain   Agitation  Comfort measures: Patient had progressive decline in her condition without reversible cause, and  subsequently transitioned to comfort measures.  Appreciate palliative care input last seen by palliative care 12/9>TOC working on SNF with hospice.   Acute GI bleeding Acute blood loss anemia Nonbleeding gastric ulcer/gastritis/duodenitis/nonbleeding internal ulcer and angioplastic lesion: Patient was seen by GI managed with blood transfusion supportive care.  Underwent EGD 12/1.  Aspirin Plavix discontinued.  Concern for recurrent aspiration: Now on comfort measures.  Continue aspiration precaution Acute metabolic encephalopathy/lethargy: Repeat MRI during this admission did not show progression of her previous stroke.  Now being managed with comfort measures.  Has been having intermittent confusions, poor intake, with overall decline.  Hypokalemia/hypomagnesemia:Repleted history of CZY:SAYTKZ MRI shows stable previous stroke Seizure disorder: was on vimpat and Depakote> while unable to take p.o. changed to IV Dysphagia:Diet as tolerated HTN-stable Severe protein calorie malnutrition with failure to thrive Nutrition Problem: Severe Malnutrition Etiology: chronic illness Signs/Symptoms: severe fat depletion, severe muscle depletion Interventions: Ensure Enlive (each supplement provides 350kcal and 20 grams of protein), MVI, Refer to RD note for recommendations  DVT prophylaxis: none Code Status: Full code Family Communication: niece was previously updated Status is: Inpatient Patient has been transitioned to comfort care Level of care: Med-Surg  Awaiting for placement to skilled nursing facility with hospice services  Objective: Vitals last 24 hrs: Vitals:   02/13/22 1328 02/14/22 0616 02/14/22 1339 02/15/22 0515  BP: (!) 144/90 (!) 148/97 120/82 (!) 143/102  Pulse: (!) 102 (!) 59 61 60  Resp: 20 18 18 20   Temp: (!) 97.5 F (36.4 C) 97.8 F (36.6  C) 98 F  (36.7 C) (!) 97.5 F (36.4 C)  TempSrc: Oral Oral Oral Oral  SpO2: 95% 100% 99% 100%  Weight:      Height:       Weight change:   Physical Examination: General exam: AA, elderly, frail weak,older appearing HEENT:Oral mucosa moist, Ear/Nose WNL grossly, dentition normal. Respiratory system: bilaterally clear BS,no use of accessory muscle Cardiovascular system: S1 & S2 +, regular rate. Gastrointestinal system: Abdomen soft, NT,ND,BS+ Nervous System:Alert, awake, in fetal position.   Extremities: LE ankle edema neg, lower extremities warm Skin: No rashes,no icterus. MSK: thin, weak.  Medications reviewed:  Scheduled Meds:  feeding supplement  237 mL Oral BID BM   Gerhardt's butt cream   Topical QID   influenza vaccine adjuvanted  0.5 mL Intramuscular Tomorrow-1000   pneumococcal 20-valent conjugate vaccine  0.5 mL Intramuscular Tomorrow-1000   Continuous Infusions:  lacosamide (VIMPAT) IV Stopped (02/14/22 2318)   valproate sodium Stopped (02/15/22 3559)      Diet Order             Diet full liquid Room service appropriate? Yes; Fluid consistency: Nectar Thick  Diet effective now                  Intake/Output Summary (Last 24 hours) at 02/15/2022 0934 Last data filed at 02/15/2022 0618 Gross per 24 hour  Intake 324.69 ml  Output --  Net 324.69 ml    Net IO Since Admission: 4,612.69 mL [02/15/22 0934]  Wt Readings from Last 3 Encounters:  01/28/22 41.6 kg  01/01/22 41.6 kg  01/29/21 39.2 kg     LOS: 18 days   Lanae Boast, MD [facility]  02/15/2022, 9:34 AM

## 2022-02-16 DIAGNOSIS — K921 Melena: Secondary | ICD-10-CM | POA: Diagnosis not present

## 2022-02-16 NOTE — Plan of Care (Signed)
Patient Wanda Valdez to self only, slurred speech and mumbling heard. VSS throughout shift. All meds given on time as ordered. Diminished lungs, IS taught and encouraged. Purewick in place. Full linen change and pericare provided.  POC maintained, will continue to monitor.    Problem: Education: Goal: Understanding of CV disease, CV risk reduction, and recovery process will improve 02/16/2022 0625 by Burnett Corrente, RN Outcome: Progressing 02/16/2022 0625 by Burnett Corrente, RN Outcome: Progressing Goal: Individualized Educational Video(s) 02/16/2022 0625 by Burnett Corrente, RN Outcome: Progressing 02/16/2022 0625 by Burnett Corrente, RN Outcome: Progressing   Problem: Activity: Goal: Ability to return to baseline activity level will improve 02/16/2022 0625 by Burnett Corrente, RN Outcome: Progressing 02/16/2022 0625 by Burnett Corrente, RN Outcome: Progressing   Problem: Cardiovascular: Goal: Ability to achieve and maintain adequate cardiovascular perfusion will improve 02/16/2022 0625 by Burnett Corrente, RN Outcome: Progressing 02/16/2022 0625 by Burnett Corrente, RN Outcome: Progressing Goal: Vascular access site(s) Level 0-1 will be maintained 02/16/2022 0625 by Burnett Corrente, RN Outcome: Progressing 02/16/2022 0625 by Burnett Corrente, RN Outcome: Progressing   Problem: Health Behavior/Discharge Planning: Goal: Ability to safely manage health-related needs after discharge will improve 02/16/2022 0625 by Burnett Corrente, RN Outcome: Progressing 02/16/2022 0625 by Burnett Corrente, RN Outcome: Progressing   Problem: Nutrition: Goal: Risk of aspiration will decrease 02/16/2022 0625 by Burnett Corrente, RN Outcome: Progressing 02/16/2022 0625 by Burnett Corrente, RN Outcome: Progressing Goal: Dietary intake will improve 02/16/2022 0625 by Burnett Corrente, RN Outcome: Progressing 02/16/2022 0625 by Burnett Corrente, RN Outcome: Progressing   Problem: Education: Goal: Knowledge of General Education information  will improve Description: Including pain rating scale, medication(s)/side effects and non-pharmacologic comfort measures 02/16/2022 0625 by Burnett Corrente, RN Outcome: Progressing 02/16/2022 0625 by Burnett Corrente, RN Outcome: Progressing   Problem: Health Behavior/Discharge Planning: Goal: Ability to manage health-related needs will improve 02/16/2022 0625 by Burnett Corrente, RN Outcome: Progressing 02/16/2022 0625 by Burnett Corrente, RN Outcome: Progressing   Problem: Clinical Measurements: Goal: Ability to maintain clinical measurements within normal limits will improve 02/16/2022 0625 by Burnett Corrente, RN Outcome: Progressing 02/16/2022 0625 by Burnett Corrente, RN Outcome: Progressing Goal: Will remain free from infection 02/16/2022 0625 by Burnett Corrente, RN Outcome: Progressing 02/16/2022 0625 by Burnett Corrente, RN Outcome: Progressing Goal: Diagnostic test results will improve 02/16/2022 0625 by Burnett Corrente, RN Outcome: Progressing 02/16/2022 0625 by Burnett Corrente, RN Outcome: Progressing Goal: Respiratory complications will improve 02/16/2022 0625 by Burnett Corrente, RN Outcome: Progressing 02/16/2022 0625 by Burnett Corrente, RN Outcome: Progressing Goal: Cardiovascular complication will be avoided 02/16/2022 0625 by Burnett Corrente, RN Outcome: Progressing 02/16/2022 0625 by Burnett Corrente, RN Outcome: Progressing   Problem: Activity: Goal: Risk for activity intolerance will decrease 02/16/2022 0625 by Burnett Corrente, RN Outcome: Progressing 02/16/2022 0625 by Burnett Corrente, RN Outcome: Progressing   Problem: Nutrition: Goal: Adequate nutrition will be maintained 02/16/2022 0625 by Burnett Corrente, RN Outcome: Progressing 02/16/2022 0625 by Burnett Corrente, RN Outcome: Progressing   Problem: Coping: Goal: Level of anxiety will decrease 02/16/2022 0625 by Burnett Corrente, RN Outcome: Progressing 02/16/2022 0625 by Burnett Corrente, RN Outcome: Progressing   Problem:  Elimination: Goal: Will not experience complications related to bowel motility 02/16/2022 0625 by Burnett Corrente, RN Outcome: Progressing 02/16/2022 0625 by Burnett Corrente, RN Outcome: Progressing Goal: Will not experience complications related to urinary retention 02/16/2022 0625 by Burnett Corrente, RN Outcome: Progressing 02/16/2022 0625 by Burnett Corrente, RN Outcome: Progressing   Problem: Pain Managment: Goal: General experience of comfort will improve 02/16/2022 0625 by  Burnett Corrente, RN Outcome: Progressing 02/16/2022 0625 by Burnett Corrente, RN Outcome: Progressing   Problem: Safety: Goal: Ability to remain free from injury will improve 02/16/2022 0625 by Burnett Corrente, RN Outcome: Progressing 02/16/2022 0625 by Burnett Corrente, RN Outcome: Progressing   Problem: Skin Integrity: Goal: Risk for impaired skin integrity will decrease 02/16/2022 0625 by Burnett Corrente, RN Outcome: Progressing 02/16/2022 0625 by Burnett Corrente, RN Outcome: Progressing   Problem: Education: Goal: Knowledge of the prescribed therapeutic regimen will improve 02/16/2022 0625 by Burnett Corrente, RN Outcome: Progressing 02/16/2022 0625 by Burnett Corrente, RN Outcome: Progressing   Problem: Coping: Goal: Ability to identify and develop effective coping behavior will improve 02/16/2022 0625 by Burnett Corrente, RN Outcome: Progressing 02/16/2022 0625 by Burnett Corrente, RN Outcome: Progressing   Problem: Clinical Measurements: Goal: Quality of life will improve 02/16/2022 0625 by Burnett Corrente, RN Outcome: Progressing 02/16/2022 0625 by Burnett Corrente, RN Outcome: Progressing   Problem: Respiratory: Goal: Verbalizations of increased ease of respirations will increase 02/16/2022 0625 by Burnett Corrente, RN Outcome: Progressing 02/16/2022 0625 by Burnett Corrente, RN Outcome: Progressing   Problem: Role Relationship: Goal: Family's ability to cope with current situation will improve 02/16/2022 0625 by  Burnett Corrente, RN Outcome: Progressing 02/16/2022 0625 by Burnett Corrente, RN Outcome: Progressing Goal: Ability to verbalize concerns, feelings, and thoughts to partner or family member will improve 02/16/2022 0625 by Burnett Corrente, RN Outcome: Progressing 02/16/2022 0625 by Burnett Corrente, RN Outcome: Progressing   Problem: Pain Management: Goal: Satisfaction with pain management regimen will improve 02/16/2022 0625 by Burnett Corrente, RN Outcome: Progressing 02/16/2022 0625 by Burnett Corrente, RN Outcome: Progressing   Problem: Education: Goal: Knowledge of the prescribed therapeutic regimen will improve 02/16/2022 0625 by Burnett Corrente, RN Outcome: Progressing 02/16/2022 0625 by Burnett Corrente, RN Outcome: Progressing   Problem: Coping: Goal: Ability to identify and develop effective coping behavior will improve 02/16/2022 0625 by Burnett Corrente, RN Outcome: Progressing 02/16/2022 0625 by Burnett Corrente, RN Outcome: Progressing   Problem: Clinical Measurements: Goal: Quality of life will improve 02/16/2022 0625 by Burnett Corrente, RN Outcome: Progressing 02/16/2022 0625 by Burnett Corrente, RN Outcome: Progressing   Problem: Respiratory: Goal: Verbalizations of increased ease of respirations will increase 02/16/2022 0625 by Burnett Corrente, RN Outcome: Progressing 02/16/2022 0625 by Burnett Corrente, RN Outcome: Progressing   Problem: Role Relationship: Goal: Family's ability to cope with current situation will improve 02/16/2022 0625 by Burnett Corrente, RN Outcome: Progressing 02/16/2022 0625 by Burnett Corrente, RN Outcome: Progressing Goal: Ability to verbalize concerns, feelings, and thoughts to partner or family member will improve 02/16/2022 0625 by Burnett Corrente, RN Outcome: Progressing 02/16/2022 0625 by Burnett Corrente, RN Outcome: Progressing   Problem: Pain Management: Goal: Satisfaction with pain management regimen will improve 02/16/2022 0625 by Burnett Corrente,  RN Outcome: Progressing 02/16/2022 0625 by Burnett Corrente, RN Outcome: Progressing

## 2022-02-16 NOTE — TOC Progression Note (Addendum)
Transition of Care [facility]) - Progression Note    Patient Details  Name: Wanda Valdez MRN: [id number] Date of Birth: [birth year]  Transition of Care [facility]) CM/SW Contact  Beckie Busing, RN Phone Number:[phone]  02/16/2022, 11:13 AM  Clinical Narrative:    CM spoke with daughter Alene Mires. Daughter is ok with patient going to [facility]. CM has spoken with [facility] supervisor who has given approval for LOG. Per Alphonzo Lemmings at [facility] patient can not come before tomorrow. CM will complete LOG paperwork.   1527 LOG paperwork completed per Sharol Roussel [facility] supervisor. [facility] will have a bed available tomorrow.    Expected Discharge Plan: Skilled Nursing Facility Barriers to Discharge: Continued Medical Work up, SNF Pending bed offer  Expected Discharge Plan and Services Expected Discharge Plan: Skilled Nursing Facility In-house Referral: NA Discharge Planning Services: CM Consult Post Acute Care Choice: Skilled Nursing Facility Living arrangements for the past 2 months: Single Family Home                 DME Arranged: N/A DME Agency: NA       HH Arranged: NA HH Agency: NA         Social Determinants of Health (SDOH) Interventions    Readmission Risk Interventions    02/03/2022    2:38 PM  Readmission Risk Prevention Plan  Transportation Screening Complete  PCP or Specialist Appt within 5-7 Days Complete  Home Care Screening Complete  Medication Review (RN CM) Referral to Pharmacy

## 2022-02-16 NOTE — Progress Notes (Signed)
Civil engineer, contracting [facility]) Hospital Liaison Note  This is a pending [facility] hospice patient. Please call [facility] prior to discharge. Thank you   Dionicio Stall, [facility] Liaison [phone]

## 2022-02-16 NOTE — Progress Notes (Signed)
Palliative:  HPI:  76 year old female with a past medical history of lymphocytic colitis, hypertension, right shoulder surgery, and CVA diagnosed in 12/2021 to which now she has residual effects from who was admitted on 01/28/2022 as patient has continued to feel ill since the discharge from her hospitalization on 01/09/2022.  During this hospitalization, patient has required transfusion for GI bleeding.  EGD performed on 01/30/2022 revealed nonbleeding gastric ulcers, gastritis, duodenitis, nonbleeding duodenal ulcer, and nonbleeding angioplastic lesion; receiving PPI and Carafate for management.  Patient also developed aspiration pneumonia with worsening breath sounds.  SLP has evaluated the patient and noted that she demonstrates worsening and signs of aspiration including delay in swallow, excessive oral retention of secretions without awareness, and cough with and without oral intake.  Palliative medicine team consulted to assist with complex medical decision making. Decision made to pursue comfort care.   Extensive chart reviewed. Wanda Valdez is lying in bed. She is confused and moving sideways in bed but no overt distress. No signs of discomfort but confusion. She is pleasantly confused but talking of getting out of bed and leaving her room. Discussed with bedside RN. She reports that Ativan has not been very helpful and we discussed utilizing haldol. I encouraged her to report if any medication seems more useful and that we can always increase dosage if needed to provide improved relief. RN also reports poor intake. No family at bedside.   Chart reviewed and noted that plans for potential LOG with transition to nursing facility placement with hospice support. Disposition seems to be the challenge here.   Exam: No distress. Thin, cachectic. Pleasantly confused. Somewhat restless. Breathing regular, unlabored. Abd soft. Moves all extremities.   Plan: - Continue comfort care - TOC working on nursing  facility with hospice. - Titrate medication as needed to ensure comfort.   25 min  Yong Channel, NP Palliative Medicine Team Pager [phone] (Please see [email or web address] for schedule) Team Phone [phone]

## 2022-02-16 NOTE — Progress Notes (Signed)
PROGRESS NOTE Sherie Dobrowolski  UYQ:[id number] DOB: 1945-03-10 DOA: 01/28/2022 PCP: Raquel James, MD   Brief Narrative/Hospital Course: 76 year old female with PMHx of lymphocytic colitis, HTN, recent right shoulder surgery.  She was also recently admitted 01/01/2022 to 01/09/2022.  During that hospitalization she was diagnosed with acute cystitis and acute CVA, started on aspirin and Plavix (for 3 weeks, then aspirin alone).  She also had a EEG that showed evidence of seizures.  She was discharged home with home health.  Since discharge, patient states she is continue to feel ill.   Labs revealed anemia with hemoglobin of 7, heme positive stool.  Patient was referred for admission and GI consulted.  Patient underwent EGD 12/1 which revealed nonbleeding gastric ulcer, gastritis, duodenitis, nonbleeding duodenal ulcer, nonbleeding angiodysplastic lesion.  She was recommended for Protonix 40 mg twice daily for 2 months.  Aspirin has been resumed, okay with GI. MBS performed secondary to dysphagia, recommended dysphagia 2 diet. PT/OT recommending SNF, awaiting placement.  While awaiting placement, patient continued to decline.  Repeat MRI and all the other metabolic workup was negative.  Palliative care team was consulted and patient was transitioned to comfort care  Awaiting for placement to SNF with hospice    Subjective: Seen and examined.  Sister at the bedside, patient is lethargic not much responsive eyes closed not communicative or interacting Assessment and Plan: Principal Problem:   GI bleeding Active Problems:   Dysphagia   Protein-calorie malnutrition, severe   Current chronic use of systemic steroids   Seizures (HCC)   Cerebrovascular accident (CVA) due to embolism of right middle cerebral artery (HCC)   Anemia due to blood loss, acute   Upper GI bleed   Palliative care encounter   Goals of care, counseling/discussion   Counseling and coordination of care   Need for emotional  support   High risk medication use   Pain   Agitation  Comfort measures: Patient had progressive decline in her condition without reversible cause, and  subsequently transitioned to comfort measures.  Appreciated palliative care input last seen by palliative care 12/9>TOC working on SNF with hospice.  At this time she is not eating much, not mobilizing, awaiting placement   Acute GI bleeding Acute blood loss anemia Nonbleeding gastric ulcer/gastritis/duodenitis/nonbleeding internal ulcer and angioplastic lesion: Patient was seen by GI managed with blood transfusion supportive care.  Underwent EGD 12/1.  Aspirin Plavix discontinued.  Concern for recurrent aspiration: Now on comfort measures.  Continue aspiration precaution Acute metabolic encephalopathy/lethargy: Repeat MRI during this admission did not show progression of her previous stroke.  Now being managed with comfort measures.  Has been having intermittent confusions, poor intake, with overall decline.  Hypokalemia/hypomagnesemia:Repleted history of GLO:VFIEPP MRI shows stable previous stroke Seizure disorder: was on vimpat and Depakote> while unable to take p.o. changed to IV Dysphagia:Diet as tolerated HTN-stable Severe protein calorie malnutrition with failure to thrive Nutrition Problem: Severe Malnutrition Etiology: chronic illness Signs/Symptoms: severe fat depletion, severe muscle depletion Interventions: Ensure Enlive (each supplement provides 350kcal and 20 grams of protein), MVI, Refer to RD note for recommendations  DVT prophylaxis: none Code Status: Full code Family Communication: niece was previously updated Status is: Inpatient Patient has been transitioned to comfort care Level of care: Med-Surg  Awaiting for placement to skilled nursing facility with hospice services  Objective: Vitals last 24 hrs: Vitals:   02/14/22 1339 02/15/22 0515 02/16/22 0601 02/16/22 0602  BP: 120/82 (!) 143/102  (!) 176/86   Pulse: 61 60  83  Resp: 18 20    Temp: 98 F (36.7 C) (!) 97.5 F (36.4 C) 98.1 F (36.7 C) 98.1 F (36.7 C)  TempSrc: Oral Oral Oral Oral  SpO2: 99% 100%  96%  Weight:      Height:       Weight change:   Physical Examination: General exam: Lethargic minimally responsive.   HEENT:Oral mucosa moist, Ear/Nose WNL grossly, dentition normal. Respiratory system: bilaterally CLEARBS,no use of accessory muscle Cardiovascular system: S1 & S2 +, regular rate. Gastrointestinal system: Abdomen soft,NT,ND,BS+ Nervous System: Lethargic, in fetal position  Extremities: LE ankle edema NEG, lower extremities warm Skin: No rashes,no icterus. MSK: Small muscle bulk weak tone  Medications reviewed:  Scheduled Meds:  feeding supplement  237 mL Oral BID BM   Gerhardt's butt cream   Topical QID   influenza vaccine adjuvanted  0.5 mL Intramuscular Tomorrow-1000   pneumococcal 20-valent conjugate vaccine  0.5 mL Intramuscular Tomorrow-1000   Continuous Infusions:  lacosamide (VIMPAT) IV 100 mg (02/16/22 1018)   valproate sodium 500 mg (02/16/22 0557)      Diet Order             Diet full liquid Room service appropriate? Yes; Fluid consistency: Nectar Thick  Diet effective now                  Intake/Output Summary (Last 24 hours) at 02/16/2022 1203 Last data filed at 02/15/2022 2100 Gross per 24 hour  Intake 35 ml  Output --  Net 35 ml    Net IO Since Admission: 4,647.69 mL [02/16/22 1203]  Wt Readings from Last 3 Encounters:  01/28/22 41.6 kg  01/01/22 41.6 kg  01/29/21 39.2 kg     LOS: 19 days   Lanae Boast, MD [facility]  02/16/2022, 12:03 PM

## 2022-02-17 DIAGNOSIS — K921 Melena: Secondary | ICD-10-CM | POA: Diagnosis not present

## 2022-02-17 NOTE — Discharge Summary (Signed)
Physician Discharge Summary  Wanda Valdez AQT:[id number] DOB: 12-12-1945 DOA: 01/28/2022  PCP: Raquel James, MD  Admit date: 01/28/2022 Discharge date: 02/17/2022 Recommendations for Outpatient Follow-up:  Follow up with PCP in 1 weeks-call for appointment Please obtain BMP/CBC in one week  Discharge Dispo: hospice/SNF Discharge Condition: Stable Code Status:   Code Status: DNR Diet recommendation:  Diet Order             Diet full liquid Room service appropriate? Yes; Fluid consistency: Nectar Thick  Diet effective now                 Brief/Interim Summary:76 year old female with PMHx of lymphocytic colitis, HTN, recent right shoulder surgery.  She was also recently admitted 01/01/2022 to 01/09/2022.  During that hospitalization she was diagnosed with acute cystitis and acute CVA, started on aspirin and Plavix (for 3 weeks, then aspirin alone).  She also had a EEG that showed evidence of seizures.  She was discharged home with home health.  Since discharge, patient states she is continue to feel ill. Labs revealed anemia with hemoglobin of 7, heme positive stool.  Patient was referred for admission and GI consulted.  Patient underwent EGD 12/1 which revealed nonbleeding gastric ulcer, gastritis, duodenitis, nonbleeding duodenal ulcer, nonbleeding angiodysplastic lesion.  She was recommended for Protonix 40 mg twice daily for 2 months.  Aspirin has been resumed, okay with GI. MBS performed secondary to dysphagia, recommended dysphagia 2 diet. PT/OT recommending SNF, awaiting placement.  While awaiting placement, patient continued to decline.  Repeat MRI and all the other metabolic workup was negative.  Palliative care team was consulted and patient was transitioned to comfort care.Awaiting for placement to SNF with hospice   Discharge Diagnoses:  Principal Problem:   GI bleeding Active Problems:   Dysphagia   Protein-calorie malnutrition, severe   Current chronic use of systemic  steroids   Seizures (HCC)   Cerebrovascular accident (CVA) due to embolism of right middle cerebral artery (HCC)   Anemia due to blood loss, acute   Upper GI bleed   Palliative care encounter   Goals of care, counseling/discussion   Counseling and coordination of care   Need for emotional support   High risk medication use   Pain   Agitation  Comfort measures: Patient had progressive decline in her condition without reversible cause, and  subsequently transitioned to comfort measures.  Appreciated palliative care input last seen by palliative care 12/9>TOC working on SNF with hospice.  At this time she is not eating much, not mobilizing, awaiting placement to SNF/hospice has had very minimal intake or no intake overall prognosis very poor   Acute GI bleeding Acute blood loss anemia Nonbleeding gastric ulcer/gastritis/duodenitis/nonbleeding internal ulcer and angioplastic lesion: Patient was seen by GI managed with blood transfusion supportive care.  Underwent EGD 12/1.  Aspirin Plavix discontinued.   Concern for recurrent aspiration: Now on comfort measures.  Continue aspiration precaution Acute metabolic encephalopathy/lethargy: Repeat MRI during this admission did not show progression of her previous stroke.  Now being managed with comfort measures.  Has been having intermittent confusions, poor intake, with overall decline.   Hypokalemia/hypomagnesemia:Repleted history of TGY:BWLSLH MRI shows stable previous stroke Seizure disorder: was on vimpat and Depakote, on iv while here Dysphagia:Diet as tolerated HTN-stable Severe protein calorie malnutrition with failure to thrive Nutrition Problem: Severe Malnutrition Etiology: chronic illness Signs/Symptoms: severe fat depletion, severe muscle depletion Interventions: Ensure Enlive (each supplement provides 350kcal and 20 grams of protein), MVI, Refer to RD note  for recommendations    Consults: pmt Subjective: Lethargic, opens eyes  to voice no other complaints does not communicate  Discharge Exam: Vitals:   02/16/22 0602 02/17/22 0642  BP: (!) 176/86 (!) 150/122  Pulse: 83 (!) 49  Resp:  17  Temp: 98.1 F (36.7 C) 97.8 F (36.6 C)  SpO2: 96%    General: Pt is lethargic  Cardiovascular: RRR, S1/S2 +, no rubs, no gallops Respiratory: CTA bilaterally, no wheezing, no rhonchi Abdominal: Soft, NT, ND, bowel sounds + Extremities: no edema, no cyanosis  Discharge Instructions  Discharge Instructions     Increase activity slowly   Complete by: As directed    No wound care   Complete by: As directed       Allergies as of 02/17/2022       Reactions   Lidocaine Swelling   Occurred when co-administered with propofol, they were unsure which caused the reaction though likely the lidocaine, if she does have an allergy, as she's tolerated propofol since then.        Medication List     STOP taking these medications    atorvastatin 20 MG tablet Commonly known as: LIPITOR   Creon 6000-19000 units Cpep Generic drug: Pancrelipase (Lip-Prot-Amyl)   diphenoxylate-atropine 2.5-0.025 MG tablet Commonly known as: LOMOTIL   EPINEPHrine 0.3 mg/0.3 mL Soaj injection Commonly known as: EPI-PEN   magnesium oxide 400 MG tablet Commonly known as: MAG-OX   metoprolol tartrate 25 MG tablet Commonly known as: LOPRESSOR   multivitamin with minerals Tabs tablet   pantoprazole 40 MG tablet Commonly known as: PROTONIX   potassium chloride SA 20 MEQ tablet Commonly known as: KLOR-CON M   sucralfate 1 g tablet Commonly known as: CARAFATE       TAKE these medications    divalproex 125 MG capsule Commonly known as: DEPAKOTE SPRINKLE Take 4 capsules (500 mg total) by mouth every 8 (eight) hours.   glycopyrrolate 1 MG tablet Commonly known as: ROBINUL Take 1 tablet (1 mg total) by mouth every 4 (four) hours as needed (excessive secretions).   haloperidol 2 MG/ML solution Commonly known as:  HALDOL Place 0.3 mLs (0.6 mg total) under the tongue every 4 (four) hours as needed for agitation (or delirium).   Lacosamide 100 MG Tabs Take 1 tablet (100 mg total) by mouth 2 (two) times daily.   LORazepam 2 MG/ML concentrated solution Commonly known as: ATIVAN Place 0.3 mLs (0.6 mg total) under the tongue every 4 (four) hours as needed for anxiety.   ondansetron 4 MG disintegrating tablet Commonly known as: ZOFRAN-ODT Take 1 tablet (4 mg total) by mouth every 6 (six) hours as needed for nausea.   oxyCODONE 20 MG/ML concentrated solution Commonly known as: ROXICODONE INTENSOL Place 0.1 mLs (2 mg total) under the tongue every 2 (two) hours as needed for moderate pain, severe pain or breakthrough pain (or dyspnea).        Follow-up Information     Raquel James, MD Follow up in 1 week(s).   Specialty: Family Medicine Contact information: [address] [place] [address] [phone]                Allergies  Allergen Reactions   Lidocaine Swelling    Occurred when co-administered with propofol, they were unsure which caused the reaction though likely the lidocaine, if she does have an allergy, as she's tolerated propofol since then.    The results of significant diagnostics from this hospitalization (including  imaging, microbiology, ancillary and laboratory) are listed below for reference.    Microbiology: No results found for this or any previous visit (from the past 240 hour(s)).  Procedures/Studies: Sepsis Labs No results for input(s): "WBC" in the last 168 hours.  Invalid input(s): "PROCALCITONIN", "LACTICIDVEN" Microbiology No results found for this or any previous visit (from the past 240 hour(s)).  Time coordinating discharge: 25 minutes  SIGNED: Lanae Boast, MD  [facility] 02/17/2022, 12:51 PM  If 7PM-7AM, please contact night-coverage [email or web address]

## 2022-02-17 NOTE — Progress Notes (Signed)
Attempted report x2 to [facility]. Discharge packet given to EMS for transport.

## 2022-02-17 NOTE — TOC Progression Note (Signed)
Transition of Care [facility]) - Progression Note    Patient Details  Name: Tannis Burstein MRN: [id number] Date of Birth: January 02, 1946  Transition of Care [facility]) CM/SW Contact  Beckie Busing, RN Phone Number:[phone]  02/17/2022, 2:09 PM  Clinical Narrative:    CM has confirmed with [facility] admissions director at [facility] that facility can accept the patient today.    Expected Discharge Plan: Skilled Nursing Facility Barriers to Discharge: Continued Medical Work up, SNF Pending bed offer  Expected Discharge Plan and Services Expected Discharge Plan: Skilled Nursing Facility In-house Referral: NA Discharge Planning Services: CM Consult Post Acute Care Choice: Skilled Nursing Facility Living arrangements for the past 2 months: Single Family Home Expected Discharge Date: 02/17/22               DME Arranged: N/A DME Agency: NA       HH Arranged: NA HH Agency: NA         Social Determinants of Health (SDOH) Interventions    Readmission Risk Interventions    02/03/2022    2:38 PM  Readmission Risk Prevention Plan  Transportation Screening Complete  PCP or Specialist Appt within 5-7 Days Complete  Home Care Screening Complete  Medication Review (RN CM) Referral to Pharmacy

## 2022-02-17 NOTE — Progress Notes (Signed)
Attempted report x1 to [facility].

## 2022-02-17 NOTE — TOC Transition Note (Signed)
Transition of Care [facility]) - CM/SW Discharge Note   Patient Details  Name: Wanda Valdez MRN: [id number] Date of Birth: 1945-06-16  Transition of Care [facility]) CM/SW Contact:  Beckie Busing, RN Phone Number:[phone]  02/17/2022, 3:13 PM   Clinical Narrative:    CM has received confirmation from [facility] admission coordinator that patient is able to transfer to [facility]. Daughter Alene Mires has been updated. Transportation has been arranged per [facility] Discharge packet is at nurses station. TOC will sign off.    Final next level of care: Skilled Nursing Facility Barriers to Discharge: No Barriers Identified   Patient Goals and CMS Choice Patient states their goals for this hospitalization and ongoing recovery are:: Wants to get better to go home CMS Medicare.gov Compare Post Acute Care list provided to:: Patient Represenative (must comment) (daughter) Choice offered to / list presented to : Adult Children  Discharge Placement              Patient chooses bed at:  [facility]) Patient to be transferred to facility by: [facility] Name of family member notified: Carmelina Peal Patient and family notified of of transfer: 02/17/22  Discharge Plan and Services In-house Referral: NA Discharge Planning Services: CM Consult Post Acute Care Choice: Skilled Nursing Facility          DME Arranged: N/A DME Agency: NA       HH Arranged: NA HH Agency: NA        Social Determinants of Health (SDOH) Interventions     Readmission Risk Interventions    02/03/2022    2:38 PM  Readmission Risk Prevention Plan  Transportation Screening Complete  PCP or Specialist Appt within 5-7 Days Complete  Home Care Screening Complete  Medication Review (RN CM) Referral to Pharmacy

## 2022-03-02 DEATH — deceased

## 2022-04-01 ENCOUNTER — Telehealth (HOSPITAL_COMMUNITY): Payer: Self-pay

## 2022-04-01 NOTE — Telephone Encounter (Signed)
Called to schedule f/u, pt passed away 03/05/2023 per daughter. AB

## 2023-03-28 IMAGING — MR MR ABDOMEN WO/W CM MRCP
16 of 21 series · 35 of 48 positions shown · IV contrast (gadavist)
Comparison: Abdominopelvic CT 09/10/2020

CLINICAL DATA: Biliary dilatation on CT. Decreased appetite with
diarrhea, abdominal pain and decreased urine output for 3 weeks.

EXAM:
MRI ABDOMEN WITHOUT AND WITH CONTRAST (INCLUDING MRCP)
TECHNIQUE: Multiplanar multisequence MR imaging of the abdomen was performed
both before and after the administration of intravenous contrast.
Heavily T2-weighted images of the biliary and pancreatic ducts were
obtained, and three-dimensional MRCP images were rendered by post
processing.
CONTRAST:  4mL GADAVIST GADOBUTROL 1 MMOL/ML IV SOLN

[Series 3: T2 fat-sat · axial · 6.0mm · 1.25mm/px · 1 of 27 slices shown]
[im 1/27]
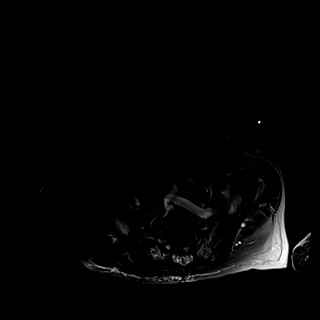

[Series 6: T2 · coronal · 6.0mm · 1.48mm/px · 1 of 30 slices shown (1 of 2)]
[im 1/30]
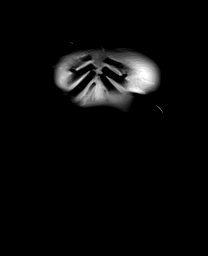

[Series 7: DWI · axial · 6.0mm · 1.49mm/px · 1 of 54 slices shown (1 of 2)]
[im 1/54]
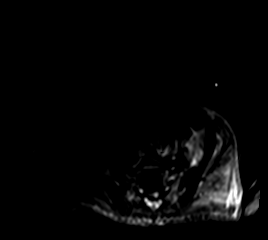

[Series 8: DWI · axial · 6.0mm · 1.49mm/px · 1 of 27 slices shown (2 of 2)]
[im 1/27]
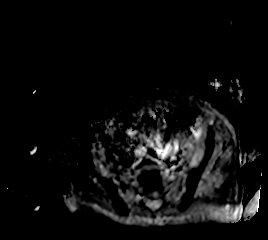

[Series 9: T1 · axial · 3.0mm · 1.25mm/px · z∈[-95,+82]mm · 3 of 60 slices shown (1 of 2)]
[im 1/60]
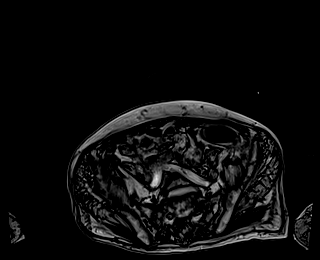
[im 30/60]
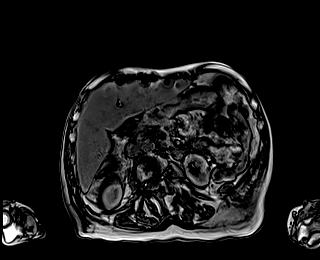
[im 60/60]
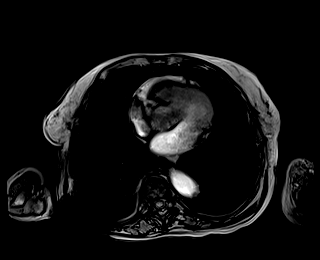

[Series 10: T1 · axial · 3.0mm · 1.25mm/px · z∈[-95,+82]mm · 3 of 60 slices shown (2 of 2)]
[im 1/60]
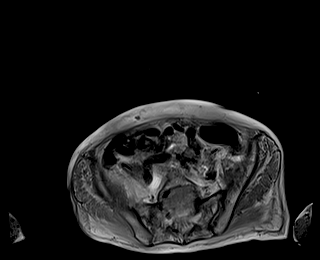
[im 30/60]
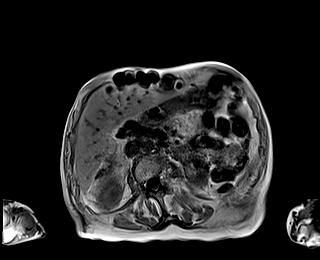
[im 60/60]
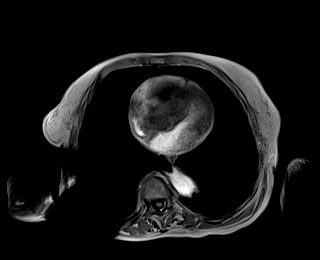

[Series 11: cor obl thk · sagittal · 50.0mm · 0.78mm/px · 1 of 9 slices shown]
[im 1/9]
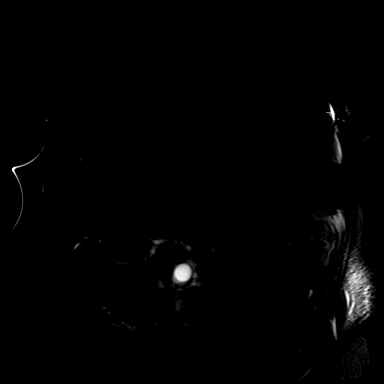

[Series 12: cor_3d_spc_trig · coronal · 1.0mm · 0.49mm/px · 3 of 72 slices shown]
[im 1/72]
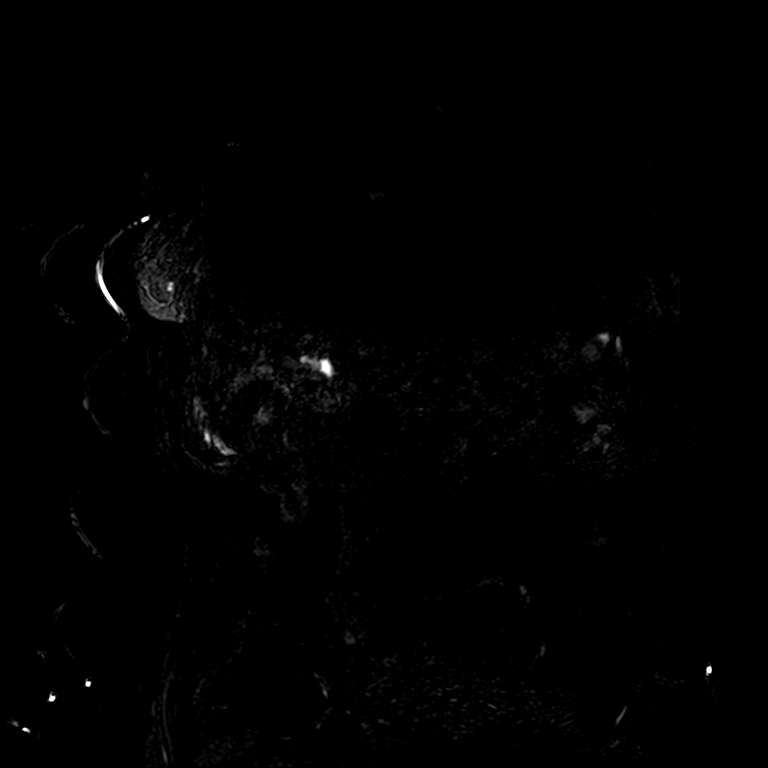
[im 36/72]
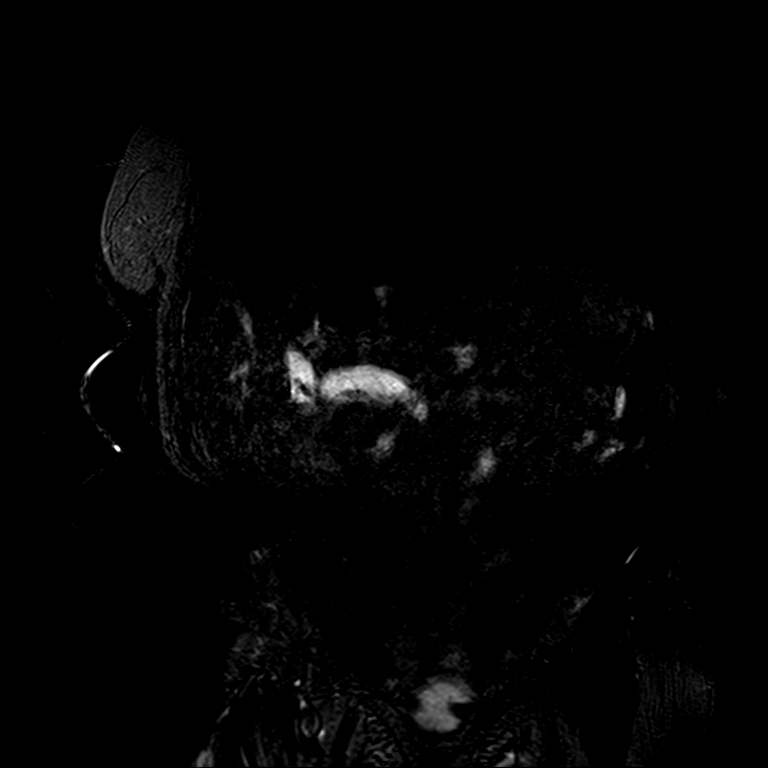
[im 72/72]
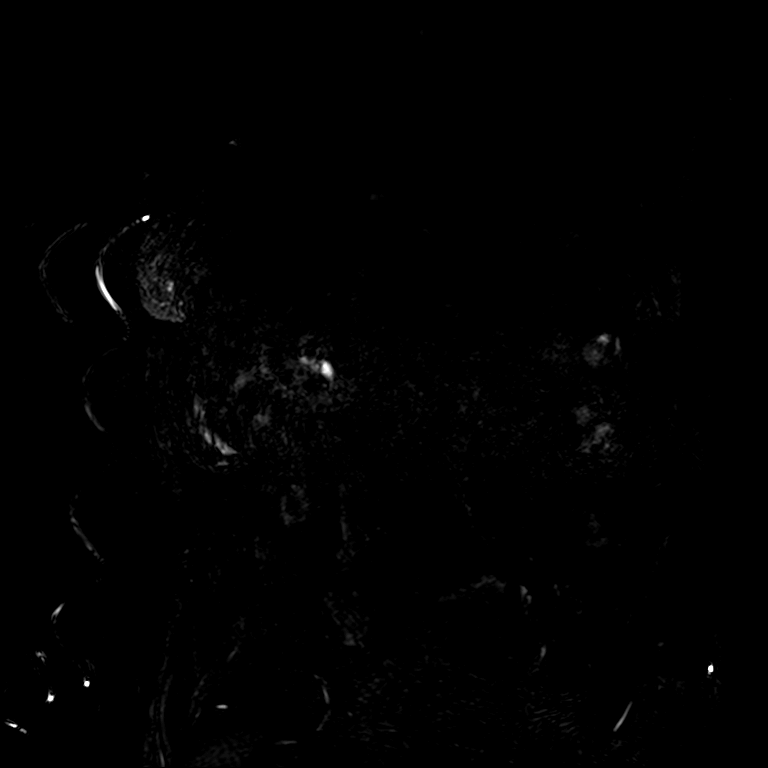

[Series 14: T2 · axial · 6.0mm · 1.56mm/px · 1 of 27 slices shown (2 of 2)]
[im 1/27]
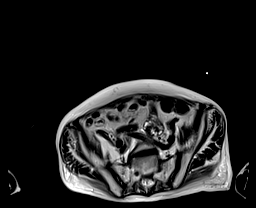

[Series 16: T1 dynamic · axial · 3.0mm · 1.25mm/px · z∈[-101,+88]mm · 3 of 64 slices shown (1 of 7)]
[im 1/64]
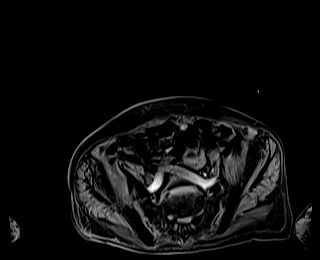
[im 32/64]
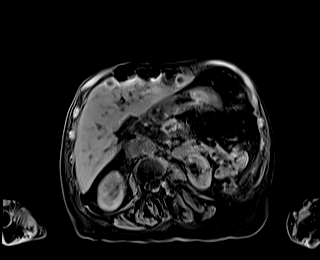
[im 64/64]
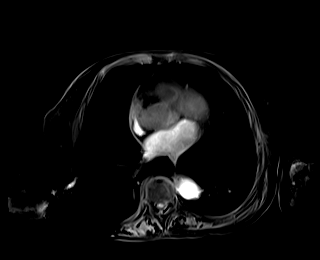

[Series 20: T1 dynamic · axial · 3.0mm · 1.25mm/px · z∈[-101,+88]mm · 3 of 64 slices shown (2 of 7)]
[im 1/64]
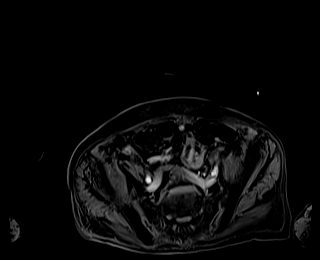
[im 32/64]
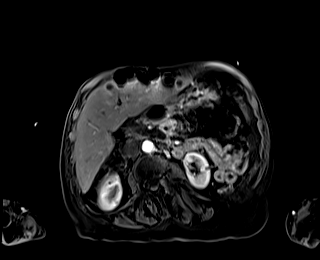
[im 64/64]
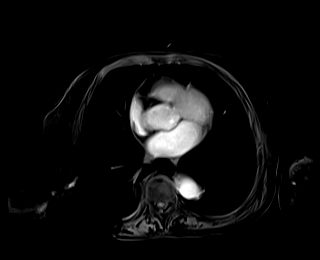

[Series 21: T1 dynamic · axial · 3.0mm · 1.25mm/px · z∈[-101,+88]mm · 3 of 64 slices shown (3 of 7)]
[im 1/64]
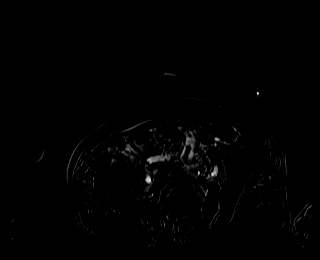
[im 32/64]
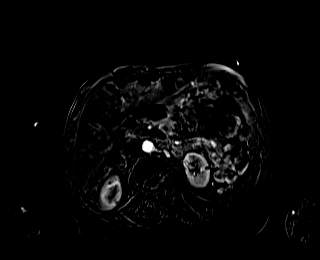
[im 64/64]
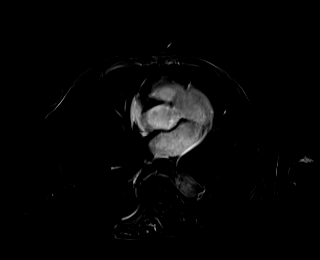

[Series 24: T1 dynamic · axial · 3.0mm · 1.25mm/px · z∈[-101,+88]mm · 3 of 64 slices shown (4 of 7)]
[im 1/64]
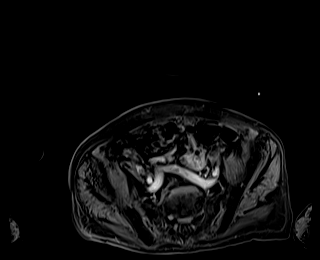
[im 32/64]
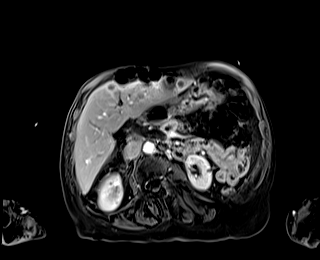
[im 64/64]
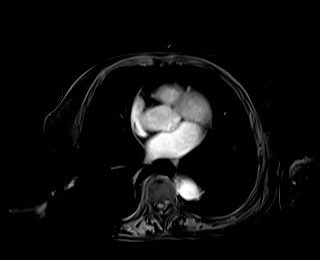

[Series 25: T1 dynamic · axial · 3.0mm · 1.25mm/px · z∈[-101,+88]mm · 3 of 64 slices shown (5 of 7)]
[im 1/64]
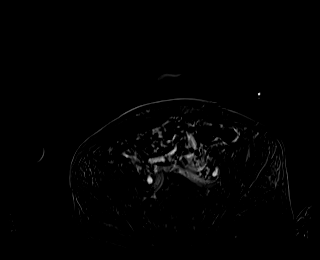
[im 32/64]
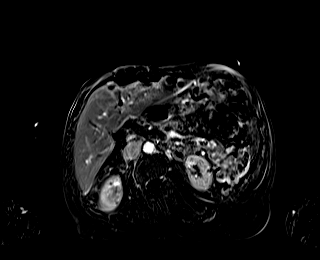
[im 64/64]
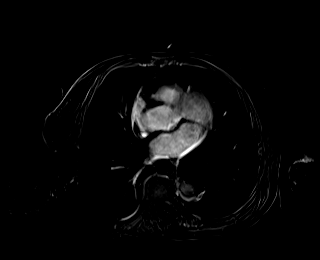

[Series 28: T1 dynamic · axial · 3.0mm · 1.25mm/px · z∈[-101,+88]mm · 3 of 64 slices shown (6 of 7)]
[im 1/64]
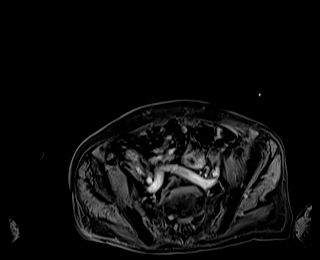
[im 32/64]
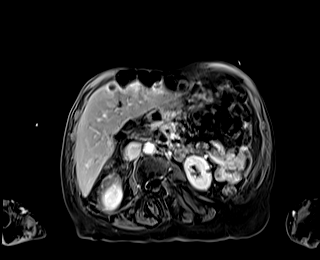
[im 64/64]
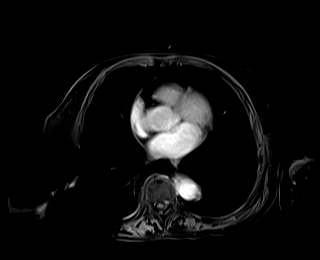

[Series 29: T1 dynamic · axial · 3.0mm · 1.25mm/px · z∈[-101,-8]mm · 2 of 64 slices shown (7 of 7)]
[im 1/64]
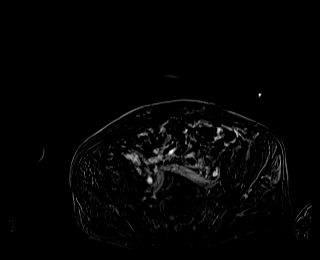
[im 32/64]
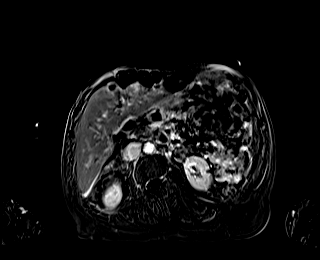

[35 of 48 positions shown; findings below may reference images not displayed]

FINDINGS: Lower chest:  The visualized lower chest appears unremarkable.

Hepatobiliary: Mild hepatic steatosis with loss of signal on the
gradient echo opposed phase images. No focal hepatic lesions or
abnormal enhancement identified. Status post cholecystectomy. Again
demonstrated is moderate intra and extrahepatic biliary dilatation.
The common hepatic duct measures up to 1.4 cm in diameter. No
evidence of choledocholithiasis.

Pancreas: Mild dilatation of the main pancreatic duct to 6 mm. There
is a small cystic lesion in the uncinate process of the pancreas,
measuring 1.3 x 0.6 cm on image [DATE]. No evidence of enhancing mass
or surrounding inflammation.

Spleen: Normal in size without focal abnormality.

Adrenals/Urinary Tract: Both adrenal glands appear normal. Bilateral
renal cortical scarring and cysts. No hydronephrosis or enhancing
mass identified.

Stomach/Bowel: No evidence of ampullary mass. Mild wall thickening
of the ascending colon again noted, similar to recent CT. The rectum
appears mildly distended with liquid stool on the coronal images. No
evidence of bowel obstruction.

Vascular/Lymphatic: There are no enlarged abdominal lymph nodes.
Diffuse aortic and branch vessel atherosclerosis.

Other: There are postsurgical changes in the anterior abdominal wall
consistent with previous hernia repair. There is associated
susceptibility artifact. There is additional artifact within the
posterior right chest wall.

Musculoskeletal: Convex right thoracolumbar scoliosis with
associated spondylosis. Bilateral sacral insufficiency fractures are
again noted as seen on CT. These do not appear to be associated with
much marrow edema and could be subacute, although are incompletely
evaluated by this abdominal study.
IMPRESSION: 1. Stable intra and extrahepatic biliary dilatation with mild
pancreatic ductal dilatation. No evidence of choledocholithiasis or
other clear explanation. Continued follow-up of the patient's liver
function studies recommended, currently not suggesting biliary
obstruction.
2. Small cystic lesion in the uncinate process of the pancreas
without aggressive characteristics. Recommend follow-up imaging in 2
years per consensus guidelines. This recommendation follows ACR
consensus guidelines: Management of Incidental Pancreatic Cysts: A
White Paper of the ACR Incidental Findings Committee. [HOSPITAL] 5865;[DATE].
3. Persistent mild wall thickening of the right colon which could
reflect mild colitis.
4. Stable additional incidental findings including renal cortical
scarring, aortic atherosclerosis and probable bilateral sacral
insufficiency fractures.

## 2023-08-28 ENCOUNTER — Encounter (HOSPITAL_COMMUNITY): Payer: Self-pay | Admitting: Interventional Radiology
